# Patient Record
Sex: Male | Born: 1990 | Hispanic: Yes | Marital: Single | Smoking: Never smoker
Health system: Southern US, Community
[De-identification: ages and names within clinical notes are randomized; demographics above are authoritative.]

## PROBLEM LIST (undated history)

## (undated) DIAGNOSIS — B582 Toxoplasma meningoencephalitis: Secondary | ICD-10-CM

## (undated) DIAGNOSIS — D893 Immune reconstitution syndrome: Secondary | ICD-10-CM

## (undated) DIAGNOSIS — D649 Anemia, unspecified: Secondary | ICD-10-CM

## (undated) DIAGNOSIS — K148 Other diseases of tongue: Secondary | ICD-10-CM

## (undated) DIAGNOSIS — Z21 Asymptomatic human immunodeficiency virus [HIV] infection status: Secondary | ICD-10-CM

## (undated) DIAGNOSIS — R531 Weakness: Secondary | ICD-10-CM

## (undated) DIAGNOSIS — Q821 Xeroderma pigmentosum: Secondary | ICD-10-CM

## (undated) DIAGNOSIS — B20 Human immunodeficiency virus [HIV] disease: Secondary | ICD-10-CM

## (undated) DIAGNOSIS — G629 Polyneuropathy, unspecified: Secondary | ICD-10-CM

## (undated) DIAGNOSIS — I75021 Atheroembolism of right lower extremity: Secondary | ICD-10-CM

## (undated) HISTORY — DX: Immune reconstitution syndrome: D89.3

## (undated) HISTORY — DX: Atheroembolism of right lower extremity: I75.021

## (undated) HISTORY — DX: Other diseases of tongue: K14.8

## (undated) HISTORY — PX: NO PAST SURGERIES: SHX2092

## (undated) HISTORY — DX: Polyneuropathy, unspecified: G62.9

## (undated) HISTORY — DX: Xeroderma pigmentosum: Q82.1

## (undated) HISTORY — DX: Weakness: R53.1

---

## 2018-08-10 ENCOUNTER — Encounter (HOSPITAL_COMMUNITY): Payer: Self-pay | Admitting: Pharmacy Technician

## 2018-08-10 ENCOUNTER — Other Ambulatory Visit: Payer: Self-pay

## 2018-08-10 ENCOUNTER — Emergency Department (HOSPITAL_COMMUNITY): Payer: Self-pay

## 2018-08-10 ENCOUNTER — Inpatient Hospital Stay (HOSPITAL_COMMUNITY)
Admission: EM | Admit: 2018-08-10 | Discharge: 2018-08-18 | DRG: 974 | Disposition: A | Discharge status: Home / Self Care | Payer: Self-pay | Attending: Internal Medicine | Admitting: Internal Medicine

## 2018-08-10 DIAGNOSIS — G9340 Encephalopathy, unspecified: Secondary | ICD-10-CM | POA: Diagnosis present

## 2018-08-10 DIAGNOSIS — H5461 Unqualified visual loss, right eye, normal vision left eye: Secondary | ICD-10-CM | POA: Diagnosis present

## 2018-08-10 DIAGNOSIS — B20 Human immunodeficiency virus [HIV] disease: Principal | ICD-10-CM | POA: Insufficient documentation

## 2018-08-10 DIAGNOSIS — F1910 Other psychoactive substance abuse, uncomplicated: Secondary | ICD-10-CM

## 2018-08-10 DIAGNOSIS — G939 Disorder of brain, unspecified: Secondary | ICD-10-CM

## 2018-08-10 DIAGNOSIS — E46 Unspecified protein-calorie malnutrition: Secondary | ICD-10-CM | POA: Diagnosis present

## 2018-08-10 DIAGNOSIS — Z791 Long term (current) use of non-steroidal anti-inflammatories (NSAID): Secondary | ICD-10-CM

## 2018-08-10 DIAGNOSIS — H532 Diplopia: Secondary | ICD-10-CM | POA: Diagnosis present

## 2018-08-10 DIAGNOSIS — Z6821 Body mass index (BMI) 21.0-21.9, adult: Secondary | ICD-10-CM

## 2018-08-10 DIAGNOSIS — D638 Anemia in other chronic diseases classified elsewhere: Secondary | ICD-10-CM | POA: Diagnosis present

## 2018-08-10 DIAGNOSIS — E860 Dehydration: Secondary | ICD-10-CM | POA: Diagnosis present

## 2018-08-10 DIAGNOSIS — R339 Retention of urine, unspecified: Secondary | ICD-10-CM | POA: Diagnosis present

## 2018-08-10 DIAGNOSIS — G9349 Other encephalopathy: Secondary | ICD-10-CM | POA: Diagnosis present

## 2018-08-10 DIAGNOSIS — E871 Hypo-osmolality and hyponatremia: Secondary | ICD-10-CM

## 2018-08-10 DIAGNOSIS — A86 Unspecified viral encephalitis: Secondary | ICD-10-CM | POA: Diagnosis present

## 2018-08-10 DIAGNOSIS — R410 Disorientation, unspecified: Secondary | ICD-10-CM

## 2018-08-10 DIAGNOSIS — Z79899 Other long term (current) drug therapy: Secondary | ICD-10-CM

## 2018-08-10 DIAGNOSIS — B582 Toxoplasma meningoencephalitis: Secondary | ICD-10-CM

## 2018-08-10 DIAGNOSIS — G969 Disorder of central nervous system, unspecified: Secondary | ICD-10-CM

## 2018-08-10 DIAGNOSIS — E222 Syndrome of inappropriate secretion of antidiuretic hormone: Secondary | ICD-10-CM | POA: Diagnosis present

## 2018-08-10 DIAGNOSIS — G936 Cerebral edema: Secondary | ICD-10-CM | POA: Diagnosis present

## 2018-08-10 DIAGNOSIS — G0490 Encephalitis and encephalomyelitis, unspecified: Secondary | ICD-10-CM

## 2018-08-10 DIAGNOSIS — H547 Unspecified visual loss: Secondary | ICD-10-CM

## 2018-08-10 DIAGNOSIS — B589 Toxoplasmosis, unspecified: Secondary | ICD-10-CM | POA: Diagnosis present

## 2018-08-10 HISTORY — DX: Anemia, unspecified: D64.9

## 2018-08-10 HISTORY — DX: Human immunodeficiency virus (HIV) disease: B20

## 2018-08-10 HISTORY — DX: Toxoplasma meningoencephalitis: B58.2

## 2018-08-10 HISTORY — DX: Asymptomatic human immunodeficiency virus (hiv) infection status: Z21

## 2018-08-10 LAB — CBC WITH DIFFERENTIAL/PLATELET
ABS IMMATURE GRANULOCYTES: 0.06 10*3/uL (ref 0.00–0.07)
BASOS PCT: 1 %
Basophils Absolute: 0 10*3/uL (ref 0.0–0.1)
EOS ABS: 0.5 10*3/uL (ref 0.0–0.5)
Eosinophils Relative: 13 %
HEMATOCRIT: 40.8 % (ref 39.0–52.0)
Hemoglobin: 13.1 g/dL (ref 13.0–17.0)
IMMATURE GRANULOCYTES: 2 %
LYMPHS ABS: 0.9 10*3/uL (ref 0.7–4.0)
Lymphocytes Relative: 22 %
MCH: 27.4 pg (ref 26.0–34.0)
MCHC: 32.1 g/dL (ref 30.0–36.0)
MCV: 85.4 fL (ref 80.0–100.0)
MONO ABS: 0.4 10*3/uL (ref 0.1–1.0)
MONOS PCT: 9 %
Neutro Abs: 2 10*3/uL (ref 1.7–7.7)
Neutrophils Relative %: 53 %
PLATELETS: 333 10*3/uL (ref 150–400)
RBC: 4.78 MIL/uL (ref 4.22–5.81)
RDW: 12.6 % (ref 11.5–15.5)
WBC: 3.8 10*3/uL — ABNORMAL LOW (ref 4.0–10.5)
nRBC: 0 % (ref 0.0–0.2)

## 2018-08-10 LAB — COMPREHENSIVE METABOLIC PANEL
ALBUMIN: 3.3 g/dL — AB (ref 3.5–5.0)
ALK PHOS: 46 U/L (ref 38–126)
ALT: 21 U/L (ref 0–44)
AST: 35 U/L (ref 15–41)
Anion gap: 8 (ref 5–15)
BILIRUBIN TOTAL: 0.5 mg/dL (ref 0.3–1.2)
BUN: 11 mg/dL (ref 6–20)
CALCIUM: 8.4 mg/dL — AB (ref 8.9–10.3)
CO2: 22 mmol/L (ref 22–32)
Chloride: 99 mmol/L (ref 98–111)
Creatinine, Ser: 0.81 mg/dL (ref 0.61–1.24)
GFR calc Af Amer: >60 mL/min (ref >60)
GFR calc non Af Amer: >60 mL/min (ref >60)
GLUCOSE: 95 mg/dL (ref 70–99)
Potassium: 4.1 mmol/L (ref 3.5–5.1)
SODIUM: 129 mmol/L — AB (ref 135–145)
TOTAL PROTEIN: 8 g/dL (ref 6.5–8.1)

## 2018-08-10 LAB — CBC
HCT: 40.9 % (ref 39.0–52.0)
Hemoglobin: 13.1 g/dL (ref 13.0–17.0)
MCH: 26.7 pg (ref 26.0–34.0)
MCHC: 32 g/dL (ref 30.0–36.0)
MCV: 83.5 fL (ref 80.0–100.0)
PLATELETS: 348 10*3/uL (ref 150–400)
RBC: 4.9 MIL/uL (ref 4.22–5.81)
RDW: 12.6 % (ref 11.5–15.5)
WBC: 3.4 10*3/uL — ABNORMAL LOW (ref 4.0–10.5)
nRBC: 0 % (ref 0.0–0.2)

## 2018-08-10 LAB — CREATININE, SERUM
CREATININE: 0.77 mg/dL (ref 0.61–1.24)
GFR calc Af Amer: >60 mL/min (ref >60)
GFR calc non Af Amer: >60 mL/min (ref >60)

## 2018-08-10 LAB — ETHANOL: Alcohol, Ethyl (B): <10 mg/dL (ref <10)

## 2018-08-10 LAB — AMMONIA: Ammonia: 17 umol/L (ref 9–35)

## 2018-08-10 MED ORDER — GADOBUTROL 1 MMOL/ML IV SOLN
7.5000 mL | Freq: Once | INTRAVENOUS | Status: AC | PRN
  Administered 2018-08-10: 7 mL via INTRAVENOUS

## 2018-08-10 MED ORDER — SULFAMETHOXAZOLE-TRIMETHOPRIM 400-80 MG/5ML IV SOLN
10.0000 mg/kg/d | Freq: Two times a day (BID) | INTRAVENOUS | Status: DC
  Administered 2018-08-10 – 2018-08-13 (×6): 306.08 mg via INTRAVENOUS
  Filled 2018-08-10 (×9): qty 19.13

## 2018-08-10 MED ORDER — ACETAMINOPHEN 650 MG RE SUPP: 650.0000 mg | Freq: Four times a day (QID) | RECTAL | Status: DC | PRN

## 2018-08-10 MED ORDER — ACETAMINOPHEN 325 MG PO TABS
650.0000 mg | ORAL_TABLET | Freq: Four times a day (QID) | ORAL | Status: DC | PRN
  Administered 2018-08-11 – 2018-08-13 (×3): 650 mg via ORAL
  Filled 2018-08-10 (×4): qty 2

## 2018-08-10 MED ORDER — DEXTROSE-NACL 5-0.9 % IV SOLN
INTRAVENOUS | Status: DC
  Administered 2018-08-10 – 2018-08-12 (×2): via INTRAVENOUS

## 2018-08-10 MED ORDER — ACETAMINOPHEN 325 MG PO TABS
650.0000 mg | ORAL_TABLET | Freq: Once | ORAL | Status: AC
  Administered 2018-08-10: 650 mg via ORAL
  Filled 2018-08-10: qty 2

## 2018-08-10 MED ORDER — ONDANSETRON HCL 4 MG PO TABS
4.0000 mg | ORAL_TABLET | Freq: Four times a day (QID) | ORAL | Status: DC | PRN
  Filled 2018-08-10: qty 1

## 2018-08-10 MED ORDER — ONDANSETRON HCL 4 MG/2ML IJ SOLN
4.0000 mg | Freq: Four times a day (QID) | INTRAMUSCULAR | Status: DC | PRN
  Administered 2018-08-12: 4 mg via INTRAVENOUS
  Filled 2018-08-10: qty 2

## 2018-08-10 MED ORDER — BOOST / RESOURCE BREEZE PO LIQD CUSTOM
1.0000 | Freq: Three times a day (TID) | ORAL | Status: DC
  Administered 2018-08-11: 21:00:00 via ORAL
  Administered 2018-08-11 – 2018-08-18 (×19): 1 via ORAL

## 2018-08-10 NOTE — ED Provider Notes (Addendum)
[facility] EMERGENCY DEPARTMENT Provider Note   CSN: [id number] Arrival date & time: 08/10/18  1118     History   Chief Complaint Chief Complaint  Patient presents with  . Weakness    HPI Thomas Park is a 27 y.o. male. Translation through video interpreter.  HPI   Patient presents with his sister for evaluation of abnormal behavior.  Patient has been ill for about 1 month with fever, myalgia and red eyes.  He went to a clinic on 08/04/2018, had blood work at that time which showed that his HIV screen was positive, for HIV 1.  No prior diagnosis of HIV disease.  He also had screening evaluation at that time with CBC, c-Met which were all normal.  For the last 3 days the patient has had trouble remembering things, paying attention and felt to be confused.  He is here with his sister, who helps give history.  He lives with another sister who is not currently here.  No prior similar symptoms.  Level 5 caveat-patient is unable to give history  Past Medical History:  Diagnosis Date  . HIV (human immunodeficiency virus infection) ([place])     There are no active problems to display for this patient.   History reviewed. No pertinent surgical history.      Home Medications    Prior to Admission medications   Not on File    Family History No family history on file.  Social History Social History   Tobacco Use  . Smoking status: Not on file  Substance Use Topics  . Alcohol use: Yes    Comment: occasional  . Drug use: Not on file     Allergies   Patient has no known allergies.   Review of Systems Review of Systems  Unable to perform ROS: Mental status change     Physical Exam Updated Vital Signs BP 127/71   Pulse 86   Resp 16   SpO2 99%   Physical Exam  Constitutional: He appears well-developed and well-nourished. He appears distressed (Restless, uncomfortable).  HENT:  Head: Normocephalic and atraumatic.  Right Ear:  External ear normal.  Left Ear: External ear normal.  Eyes: Pupils are equal, round, and reactive to light. Conjunctivae and EOM are normal.  Neck: Normal range of motion and phonation normal. Neck supple.  Cardiovascular: Normal rate, regular rhythm and normal heart sounds.  Pulmonary/Chest: Effort normal and breath sounds normal. He exhibits no bony tenderness.  Abdominal: Soft. There is no tenderness.  Musculoskeletal: Normal range of motion.  Neurological: He is alert. No cranial nerve deficit or sensory deficit. He exhibits normal muscle tone. Coordination normal.  He is oriented to person and place.  Skin: Skin is warm, dry and intact.  Psychiatric:  He is distracted.  He does not appear to be responding to internal stimuli.  Nursing note and vitals reviewed.    ED Treatments / Results  Labs (all labs ordered are listed, but only abnormal results are displayed) Labs Reviewed  COMPREHENSIVE METABOLIC PANEL - Abnormal; Notable for the following components:      Result Value   Sodium 129 (*)    Calcium 8.4 (*)    Albumin 3.3 (*)    All other components within normal limits  CBC WITH DIFFERENTIAL/PLATELET - Abnormal; Notable for the following components:   WBC 3.8 (*)    All other components within normal limits  AMMONIA  ETHANOL  HIV ANTIBODY (ROUTINE TESTING W  REFLEX)  URINALYSIS, ROUTINE W REFLEX MICROSCOPIC  RAPID URINE DRUG SCREEN, HOSP PERFORMED  T-HELPER CELLS (CD4) COUNT (NOT AT [facility])    EKG None  Radiology Mr Jeri Cos And Wo Contrast  Result Date: 08/10/2018 CLINICAL DATA:  Confusion.  HIV. EXAM: MRI HEAD WITHOUT AND WITH CONTRAST TECHNIQUE: Multiplanar, multiecho pulse sequences of the brain and surrounding structures were obtained without and with intravenous contrast. CONTRAST:  7 cc Gadavist intravenous COMPARISON:  None. FINDINGS: Brain: Multiple foci of nodular and ring enhancement along the subcortical brain, bilateral cerebellum, left brainstem, and  basal ganglia/deep white matter tracts. The largest and most avidly enhancing lesion is centered at the left globus pallidus and measures 2.4 cm. This lesion also shows the greatest perilesional edema. There is restricted diffusion but this is at the periphery rather than the center of the lesions, incompatible with pyogenic abscess. No acute hemorrhage, hydrocephalus, or extra-axial collection. No prominence of dilated perivascular spaces and no abnormal meningeal enhancement. Toxoplasmosis is favored. Other infection such as neurocysticercosis or tuberculosis is possible. Lymphoma is not favored given multi focality. Metastatic disease also considered unlikely given age. Vascular: Major flow voids are preserved. Skull and upper cervical spine: Current negative for marrow lesion Sinuses/Orbits: Negative IMPRESSION: 1. Numerous ring-enhancing lesions greatest at the left basal gangliawhich would classically represent toxoplasmosis in this setting (assuming matching CD4 level-which is currently pending). Other opportunistic infection is possible, including CNS tuberculosis, and needs correlation with labs. 2. Mild to moderate vasogenic edema with regional mass effect greatest at the left basal ganglia. Electronically Signed   By: Monte Fantasia M.D.   On: 08/10/2018 14:35    Procedures .Critical Care Performed by: Daleen Bo, MD Authorized by: Daleen Bo, MD   Critical care provider statement:    Critical care time (minutes):  35   Critical care start time:  08/10/2018 11:35 AM   Critical care end time:  08/10/2018 4:25 PM   Critical care time was exclusive of:  Separately billable procedures and treating other patients   Critical care was time spent personally by me on the following activities:  Blood draw for specimens, development of treatment plan with patient or surrogate, discussions with consultants, evaluation of patient's response to treatment, examination of patient, obtaining history  from patient or surrogate, ordering and performing treatments and interventions, ordering and review of laboratory studies, pulse oximetry, re-evaluation of patient's condition, review of old charts and ordering and review of radiographic studies   (including critical care time)  Medications Ordered in ED Medications  gadobutrol (GADAVIST) 1 MMOL/ML injection 7.5 mL (7 mLs Intravenous Contrast Given 08/10/18 1424)  acetaminophen (TYLENOL) tablet 650 mg (650 mg Oral Given 08/10/18 1631)     Initial Impression / Assessment and Plan / ED Course  I have reviewed the triage vital signs and the nursing notes.  Pertinent labs & imaging results that were available during my care of the patient were reviewed by me and considered in my medical decision making (see chart for details).  Clinical Course as of Aug 10 1638  Wed Aug 10, 2018  1616 Normal except white count low  CBC with Differential(!) [EW]  1616 Normal  Ethanol [EW]  1616 Normal except sodium low, calcium low, albumin low  Comprehensive metabolic panel(!) [EW]  9924 Nodular ring-enhancing lesions throughout the brain.  This is consistent with HIV related toxoplasmosis.  Images reviewed by me.   [EW]    Clinical Course User Index [EW] Daleen Bo, MD  Patient Vitals for the past 24 hrs:  BP Pulse Resp SpO2  08/10/18 1626 127/71 - - -  08/10/18 1230 108/64 86 - 99 %  08/10/18 1215 107/65 88 - 100 %  08/10/18 1200 107/68 84 - 100 %  08/10/18 1144 110/67 84 16 100 %    4:19 PM Reevaluation with update and discussion. After initial assessment and treatment, an updated evaluation reveals no change in clinical status. Daleen Bo   Medical Decision Making: Acute confusion, with positive testing for HIV disease, as well as MRI consistent with toxoplasmosis.  This meets criteria for advanced HIV illness and will require intervention with medical treatment and hospitalization for stabilization.  CRITICAL  CARE-yes Performed by: Daleen Bo   Nursing Notes Reviewed/ Care Coordinated Applicable Imaging Reviewed Interpretation of Laboratory Data incorporated into ED treatment   4:20 PM-Consult complete with hospitalist. Patient case explained and discussed.  He agrees to admit patient for further evaluation and treatment. Call ended at 4:38 PM  Plan: Admit    Final Clinical Impressions(s) / ED Diagnoses   Final diagnoses:  Symptomatic HIV infection ([place])  Confusion  Toxoplasmosis    ED Discharge Orders    None       Daleen Bo, MD 08/10/18 1639    Daleen Bo, MD 08/30/18 1221

## 2018-08-10 NOTE — ED Triage Notes (Addendum)
Pt arrives to ED from home with complaints of becoming more weak and confused for awhile now. Pt's family stated that he has been more dizzy and has trouble remembering things. Pt reports he has anemia and was just diagnosed with HIV this week. Pt states he has had ulcers in his mouth and itching on his skin. Pt placed in position of comfort with bed locked and lowered, call bell in reach.

## 2018-08-10 NOTE — Progress Notes (Signed)
Pharmacy Antibiotic Note  Thomas Park Thomas Park is a 27 y.o. 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  Pharmacy has been consulted for bactrim dosing. Pt with Tmax 100.8 and WBC is low at 3.8. SCr is WNL. Pt recently diagnosed with HIV.   Plan: Bactrim 10mg /kg/day divided Q12H F/u renal fxn, C&S, clinical status  F/u measured pt weight - current weight is estimated  Weight: 135 lb (61.2 kg)(estimated)  Temp (24hrs), Avg:100.8 F (38.2 C), Min:100.8 F (38.2 C), Max:100.8 F (38.2 C)  Recent Labs  Lab 08/10/18 1221  WBC 3.8*  CREATININE 0.81    CrCl cannot be calculated (Unknown ideal weight.).    No Known Allergies  Antimicrobials this admission: Bactrim 11/27>>  Dose adjustments this admission: N/A  Microbiology results: Pending  Thank you for allowing pharmacy to be a part of this patient's care.  Khaila Velarde, Rande Lawman 08/10/2018 6:04 PM

## 2018-08-10 NOTE — H&P (Addendum)
History and Physical    Phill Steck EXB:[id number] DOB: Apr 20, 1991 DOA: 08/10/2018  PCP: Patient, No Pcp Per   Patient coming from: Home   Chief Complaint: Altered mental status.   HPI: Thomas Park de Gwendalyn Ege is a 27 y.o. male with medical history significant of polysubstance abuse and recently diagnosed HIV. All information obtained from his family at the bedside, patient unable to give any history due to confusion and disorientation. For last 3 months patient has been unintentionally loosing weight, over the last month he experienced flu like symptoms including headache, congestion, malise which were persistent. He also was noted to have disseminated macular papular rash that eventually self resolved. Last week he was seen at the local clinic, HIV testing was performed which resulted positive. He was informed about this results 48 hours ago. For the last 5 days he has been confused, disoriented, had difficulty ambulating, and had poor appetite. Due to worsening confusion he was brought into the hospital today.   Apparently he is known to use illegal drugs, unknown IV drug abuse   ED Course: patient was non-in looking, confused and disoriented,unable to ambulate. Further workup with brain MRI showed numerous ring-enhancing lesion suggested toxoplasmosis. He was referred for admission and further evaluation.  Review of Systems: Obtain from his family at bedside 1. General: postoperative weight loss, malaise and failure to thrive.  2. ENT: No runny nose or sore throat, no hearing disturbances/ sinus congestion.  3. Pulmonary: No dyspnea, cough, wheezing, or hemoptysis 4. Cardiovascular: No angina, claudication, lower extremity edema, pnd or orthopnea 5. Gastrointestinal: No nausea or vomiting, no diarrhea or constipation 6. Hematology: No easy bruisability or frequent infections 7. Urology: No dysuria, hematuria or increased urinary frequency 8. Dermatology: diffuse  maculopapular rash as mentioned in the HPI. 9. Neurology: Confusion and disorientation.  10. Musculoskeletal: No joint pain or deformities  Past Medical History:  Diagnosis Date  . HIV (human immunodeficiency virus infection) ([place])     History reviewed. No pertinent surgical history.   reports that he drinks alcohol. His tobacco and drug histories are not on file.  No Known Allergies  No family history on file.   Prior to Admission medications   Not on File    Physical Exam: Vitals:   08/10/18 1200 08/10/18 1215 08/10/18 1230 08/10/18 1626  BP: 107/68 107/65 108/64 127/71  Pulse: 84 88 86   Resp:      SpO2: 100% 100% 99%     Vitals:   08/10/18 1200 08/10/18 1215 08/10/18 1230 08/10/18 1626  BP: 107/68 107/65 108/64 127/71  Pulse: 84 88 86   Resp:      SpO2: 100% 100% 99%    General: deconditioned and ill looking appearing  Neurology: Awake and alert, non focal, follows commands and preserved strength, positive confusion and disorientation, only know his name and recognized his family members.  Head and Neck. Head normocephalic. Neck supple with no adenopathy or thyromegaly.   E ENT: no pallor, no icterus, oral mucosa moist with no ulcers/ Positive bilateral conjunctival erythema.  Cardiovascular: No JVD. S1-S2 present, rhythmic, no gallops, rubs, or murmurs. No lower extremity edema. Pulmonary: positive breath sounds bilaterally, adequate air movement, no wheezing, rhonchi or rales. Gastrointestinal. Abdomen flat, no organomegaly, non tender, no rebound or guarding Skin. Healing diffuse macular rash.  Musculoskeletal: no joint deformities    Labs on Admission: I have personally reviewed following labs and imaging studies  CBC: Recent Labs  Lab  08/10/18 1221  WBC 3.8*  NEUTROABS 2.0  HGB 13.1  HCT 40.8  MCV 85.4  PLT 443   Basic Metabolic Panel: Recent Labs  Lab 08/10/18 1221  NA 129*  K 4.1  CL 99  CO2 22  GLUCOSE 95  BUN 11  CREATININE 0.81    CALCIUM 8.4*   GFR: CrCl cannot be calculated (Unknown ideal weight.). Liver Function Tests: Recent Labs  Lab 08/10/18 1221  AST 35  ALT 21  ALKPHOS 46  BILITOT 0.5  PROT 8.0  ALBUMIN 3.3*   No results for input(s): LIPASE, AMYLASE in the last 168 hours. Recent Labs  Lab 08/10/18 1221  AMMONIA 17   Coagulation Profile: No results for input(s): INR, PROTIME in the last 168 hours. Cardiac Enzymes: No results for input(s): CKTOTAL, CKMB, CKMBINDEX, TROPONINI in the last 168 hours. BNP (last 3 results) No results for input(s): PROBNP in the last 8760 hours. HbA1C: No results for input(s): HGBA1C in the last 72 hours. CBG: No results for input(s): GLUCAP in the last 168 hours. Lipid Profile: No results for input(s): CHOL, HDL, LDLCALC, TRIG, CHOLHDL, LDLDIRECT in the last 72 hours. Thyroid Function Tests: No results for input(s): TSH, T4TOTAL, FREET4, T3FREE, THYROIDAB in the last 72 hours. Anemia Panel: No results for input(s): VITAMINB12, FOLATE, FERRITIN, TIBC, IRON, RETICCTPCT in the last 72 hours. Urine analysis: No results found for: COLORURINE, APPEARANCEUR, LABSPEC, [place], GLUCOSEU, [place], [place], KETONESUR, PROTEINUR, UROBILINOGEN, NITRITE, LEUKOCYTESUR  Radiological Exams on Admission: Mr Jeri Cos And Wo Contrast  Result Date: 08/10/2018 CLINICAL DATA:  Confusion.  HIV. EXAM: MRI HEAD WITHOUT AND WITH CONTRAST TECHNIQUE: Multiplanar, multiecho pulse sequences of the brain and surrounding structures were obtained without and with intravenous contrast. CONTRAST:  7 cc Gadavist intravenous COMPARISON:  None. FINDINGS: Brain: Multiple foci of nodular and ring enhancement along the subcortical brain, bilateral cerebellum, left brainstem, and basal ganglia/deep white matter tracts. The largest and most avidly enhancing lesion is centered at the left globus pallidus and measures 2.4 cm. This lesion also shows the greatest perilesional edema. There is restricted  diffusion but this is at the periphery rather than the center of the lesions, incompatible with pyogenic abscess. No acute hemorrhage, hydrocephalus, or extra-axial collection. No prominence of dilated perivascular spaces and no abnormal meningeal enhancement. Toxoplasmosis is favored. Other infection such as neurocysticercosis or tuberculosis is possible. Lymphoma is not favored given multi focality. Metastatic disease also considered unlikely given age. Vascular: Major flow voids are preserved. Skull and upper cervical spine: Current negative for marrow lesion Sinuses/Orbits: Negative IMPRESSION: 1. Numerous ring-enhancing lesions greatest at the left basal gangliawhich would classically represent toxoplasmosis in this setting (assuming matching CD4 level-which is currently pending). Other opportunistic infection is possible, including CNS tuberculosis, and needs correlation with labs. 2. Mild to moderate vasogenic edema with regional mass effect greatest at the left basal ganglia. Electronically Signed   By: Monte Fantasia M.D.   On: 08/10/2018 14:35    EKG: Independently reviewed. NA  Assessment/Plan Active Problems:   Encephalitis  27 year old male who has been recently diagnosed with HIV, describes a syndrome consistent with acute HIV few months ago, with flu-like symptoms and disseminated rash. Over the last 5 days his condition has been complicated with worsening confusion and disorientation. On his initial physical examination he is only oriented to self,  able to recognize his family members and follow commands but otherwise he has significant confusion and disorientation.  His blood pressure is 107/68, heart rate 84,  oxygen saturation 100%, positive conjunctival erythema bilaterally, oral mucosa with no ulcers, lungs clear to auscultation bilaterally, heart S1-S2 present and rhythmic, abdomen soft nontender, lower extremity edema, diffuse macular rash that seems to be healing.  Sodium 129,  potassium 4.1, chloride 99, bicarb 22, glucose 95, BUN 11, creatinine 0.81, white count 3.8, hemoglobin 13.1, hematocrit 40.8, platelets 333.  Brain MRI with numerous ring-enhancing lesions greatest at the left basal ganglia, consistent with toxoplasmosis.  Mild to moderate vasogenic edema with regional mass-effect greatest at the left basal ganglia.   Patient will be admitted to the hospital with the working diagnosis with acute encephalitis, likely due to toxoplasmosis in the setting of immunosuppression due to HIV.  1.  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  Patient has clinical syndrome consistent with toxoplasmosis, will confirm HIV with serologies including viral load, and CD4 count. Recommended antibiotic therapy sulfadiazine and pyrimethamine, in his condition will prefer IV antibiotic therapy, case discussed with Dr. Tommy Medal and will start patient with IV Bactrim.  Start IV fluids with D5 normal saline at 75 ml /H.  Patient likely will need a lumbar puncture for further diagnostics. Neuro hecks every 4 hours, and seizure precautions.  Will send serum cryptococcus Ag, toxoplasma serum IgG, gold TB test.   Will call IR for LP, will send for cryptococcus antigen, toxoplasma antigen, may benefit from cytology as well, be beneficial since lymphoma is also in the differential.  2.  Hyponatremia.  Related to dehydration, continue IV fluids with D5, normal saline at 75 mL/h  3.  Polysubstance abuse.  Currently no signs of withdrawal.  Continue neurochecks per unit protocol.  I spoke with IR today and patient will undergo lumbar function tomorrow morning.  DVT prophylaxis: scd  Code Status: full  Family Communication: I spoke with patient's father at the bedside and all questions were addressed.    Disposition Plan: Med Surg   Consults called: ID  Admission status: Inpatient.     Melinda Gwinner Gerome Apley MD [facility] Pager [phone]  If 7PM-7AM, please contact  night-coverage [email or web address] Password [facility]  08/10/2018, 4:41 PM

## 2018-08-11 ENCOUNTER — Inpatient Hospital Stay (HOSPITAL_COMMUNITY): Payer: Self-pay

## 2018-08-11 DIAGNOSIS — F1910 Other psychoactive substance abuse, uncomplicated: Secondary | ICD-10-CM

## 2018-08-11 DIAGNOSIS — E871 Hypo-osmolality and hyponatremia: Secondary | ICD-10-CM

## 2018-08-11 LAB — BASIC METABOLIC PANEL
ANION GAP: 6 (ref 5–15)
BUN: 7 mg/dL (ref 6–20)
CO2: 22 mmol/L (ref 22–32)
Calcium: 8.3 mg/dL — ABNORMAL LOW (ref 8.9–10.3)
Chloride: 101 mmol/L (ref 98–111)
Creatinine, Ser: 0.87 mg/dL (ref 0.61–1.24)
GFR calc non Af Amer: >60 mL/min (ref >60)
GLUCOSE: 103 mg/dL — AB (ref 70–99)
POTASSIUM: 3.9 mmol/L (ref 3.5–5.1)
Sodium: 129 mmol/L — ABNORMAL LOW (ref 135–145)

## 2018-08-11 LAB — CSF CELL COUNT WITH DIFFERENTIAL
RBC Count, CSF: 1 /mm3 — ABNORMAL HIGH
TUBE #: 3
WBC CSF: 4 /mm3 (ref 0–5)

## 2018-08-11 LAB — CBC
HCT: 38.4 % — ABNORMAL LOW (ref 39.0–52.0)
HEMOGLOBIN: 12.4 g/dL — AB (ref 13.0–17.0)
MCH: 26.9 pg (ref 26.0–34.0)
MCHC: 32.3 g/dL (ref 30.0–36.0)
MCV: 83.3 fL (ref 80.0–100.0)
NRBC: 0 % (ref 0.0–0.2)
PLATELETS: 334 10*3/uL (ref 150–400)
RBC: 4.61 MIL/uL (ref 4.22–5.81)
RDW: 12.5 % (ref 11.5–15.5)
WBC: 3.9 10*3/uL — ABNORMAL LOW (ref 4.0–10.5)

## 2018-08-11 LAB — CRYPTOCOCCAL ANTIGEN, CSF: CRYPTO AG: NEGATIVE

## 2018-08-11 LAB — PROTEIN, CSF: Total  Protein, CSF: 80 mg/dL — ABNORMAL HIGH (ref 15–45)

## 2018-08-11 LAB — GLUCOSE, CSF: Glucose, CSF: 41 mg/dL (ref 40–70)

## 2018-08-11 LAB — CRYPTOCOCCAL ANTIGEN: CRYPTO AG: NEGATIVE

## 2018-08-11 MED ORDER — LIDOCAINE HCL (PF) 1 % IJ SOLN
5.0000 mL | Freq: Once | INTRAMUSCULAR | Status: AC
  Administered 2018-08-11: 5 mL via INTRADERMAL

## 2018-08-11 NOTE — Consult Note (Addendum)
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 HIV and AIDS 2. Multiple CNS lesions with differential including: CNS toxoplasmosis, cryptococcal infection, tuberculosis, less likely neurocysticercosis, brain abscesses, and primary CNS lymphoma, less likely syphilitic lesions 3. Loss of vision concerning for increased intracranial pressure versus opportunistic infection in the eye  Plan:  1. Agree with IV Bactrim which is been initiated for empiric therapy versus toxoplasmosis 2. Dr. Jimmye Norman from interventional radiology is going to perform a fluoroscopic guided lumbar puncture to measure opening pressure and remove sufficient CSF to drop that pressure in half and preferably less than 20 cm I am hoping that he can collect somewhere between 32 to 40 mL of CSF documentation of opening and closing pressures 3. CSF for toxoplasma by PCR 4. CSF for cryptococcal antigen 5. CSF VDRL 6. CSF cell count differential glucose and protein 7. Sent for MTB by PCR 8. CSF for AFB stain and culture and for this she needs at least 8 to 10 mL of CSF spun down in the sediment sent for culture 9. CSF for EBV by PCR 10. Save all remaining CSF 11. Ensure viral load with genotype is pending along with CD4 count 12. Toxoplasma antibody IgG should be sent 13. Send a QuantiFERON gold as well 14. He will need to be examined by an ophthalmologist with funduscopic exam in case he has a retinal pathology going on.  This will be surgically paramount if the patient does not have elevated intracranial pressures and does not get better as far as his vision after lumbar puncture. 26. Send blood for CMV viral load though that does not prove or disprove endorgan infection in the eye example.  Active Problems:   Encephalitis   Hyponatremia   Substance abuse (HCC)   Scheduled Meds: . feeding supplement  1  Container Oral TID BM  . lidocaine (PF)  5 mL Intradermal Once   Continuous Infusions: . dextrose 5 % and 0.9% NaCl 75 mL/hr at 08/10/18 2325  . sulfamethoxazole-trimethoprim 306.08 mg (08/11/18 0612)   PRN Meds:.acetaminophen **OR** acetaminophen, ondansetron **OR** ondansetron (ZOFRAN) IV  HPI: Thomas Park Thomas Park is a 27 y.o. male originally from [place] who now lives in [place] and was recently diagnosed with HIV though he is not giving me much information about where that test was performed.  I reviewed him with the telephonic translator from [facility] core.  His sister was present with his permission.  He presented to the emergency department with confusion being only oriented to self and only able to recognize some family members and follow commands.  He had bilateral positive conjunctive I will erythema.  He had a headache and was hyponatremic.  Brain MRI showed numerous ring-enhancing lesions most significantly in the basal ganglia.  There is mild to moderate vasogenic edema with regional mass-effect greatest in the left basal ganglia.  Concern was for toxoplasmosis versus other potential opportunistic infection such as cryptococcal meningoencephalitis, primary CNS lymphoma, other dimorphic fungal infections tuberculosis, less likely neurocysticercosis.  So less likely to be syphilitic lesion.  When I examined him today and interviewed him he was oriented to place and recognized his sister and answered many of my questions I would not tell me what [place] was [place]  He did tell  me that he was having trouble with vision and when I inquired more closely it specifically with his peripheral vision which makes me concerned that he might have elevated intracranial pressures.  I discussed the case with the hospitalist last night and again this morning and also with Dr. Jimmye Norman with interventional radiology.  I spent greater than 80 minutes with  the patient including greater than 50% of time in face to face counsel  of the patient nature of HIV and AIDS the way we would work-up his lesions and treat them and in coordination of care with the primary team and interventional radiology.  Review of Systems: Review of Systems  Constitutional: Positive for chills and fever.  HENT: Positive for sore throat. Negative for hearing loss and tinnitus.   Eyes: Positive for blurred vision. Negative for double vision.  Respiratory: Negative for cough, sputum production, shortness of breath and wheezing.   Cardiovascular: Negative for chest pain, palpitations and leg swelling.  Gastrointestinal: Negative for abdominal pain, blood in stool, constipation, diarrhea, heartburn, melena, nausea and vomiting.  Genitourinary: Negative for dysuria, flank pain and hematuria.  Musculoskeletal: Negative for back pain, falls, joint pain and myalgias.  Skin: Negative for itching and rash.  Neurological: Positive for dizziness and headaches. Negative for sensory change, focal weakness, loss of consciousness and weakness.  Endo/Heme/Allergies: Does not bruise/bleed easily.  Psychiatric/Behavioral: Positive for memory loss. Negative for depression and suicidal ideas. The patient is not nervous/anxious.     Past Medical History:  Diagnosis Date  . Anemia    [name] 08/10/2018  . HIV (human immunodeficiency virus infection) ([place])    dx'd 07/2018  . 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 Smoking status: Never Smoker  . Smokeless tobacco: Never Used  Substance Use Topics  . Alcohol use: Yes    Alcohol/week: 6.0 standard drinks    Types: 6 Cans of beer per week    Comment: 08/10/2018 "drink only on weekend"  . Drug use: Not Currently    Comment: 08/10/2018 "have tried coke; been a long time since I've usd any"    History reviewed. No pertinent family history. No Known Allergies  OBJECTIVE: Blood pressure 100/65,  pulse 83, temperature 98.7 F (37.1 C), temperature source Oral, resp. rate 16, weight 61.2 kg, SpO2 97 %.  Physical Exam  HENT:  Head: Normocephalic.  Mouth/Throat: Oropharynx is clear and moist.  Eyes: Pupils are equal, round, and reactive to light. EOM are normal. Right conjunctiva is injected. Left conjunctiva is injected.  Cardiovascular: Normal rate, regular rhythm, normal heart sounds and intact distal pulses. Exam reveals no friction rub.  No murmur heard. Pulmonary/Chest: Effort normal and breath sounds normal. No stridor. No respiratory distress.  Abdominal: Soft. Bowel sounds are normal. He exhibits no distension.  Neurological: He is alert. No cranial nerve deficit or sensory deficit. He exhibits normal muscle tone. Coordination normal.  Skin: Skin is warm and dry.  Psychiatric: He has a normal mood and affect. Judgment and thought content normal. His speech is delayed. He is slowed. Cognition and memory are normal.    Lab Results Lab Results  Component Value Date   WBC 3.9 (L) 08/11/2018   HGB 12.4 (L) 08/11/2018   HCT 38.4 (L) 08/11/2018   MCV 83.3 08/11/2018   PLT 334 08/11/2018    Lab Results  Component Value Date   CREATININE 0.87 08/11/2018   BUN 7 08/11/2018   NA 129 (L) 08/11/2018  K 3.9 08/11/2018   CL 101 08/11/2018   CO2 22 08/11/2018    Lab Results  Component Value Date   ALT 21 08/10/2018   AST 35 08/10/2018   ALKPHOS 46 08/10/2018   BILITOT 0.5 08/10/2018     Microbiology: No results found for this or any previous visit (from the past 240 hour(s)).  Alcide Evener, [facility] [phone] pager  08/11/2018, 10:28 AM

## 2018-08-11 NOTE — Plan of Care (Signed)
  Problem: Education: Goal: Knowledge of General Education information will improve Description Including pain rating scale, medication(s)/side effects and non-pharmacologic comfort measures Outcome: Progressing   

## 2018-08-11 NOTE — Procedures (Signed)
Lumbar puncture at T8-0 with no complication.  Opening pressure 21 ml.  Closing pressure 10 ml.  32 cc clear fluid removed.

## 2018-08-11 NOTE — Progress Notes (Addendum)
PROGRESS NOTE    Thomas Park  QIH:[id number] DOB: [birth year] DOA: 08/10/2018 PCP: Patient, No Pcp Per    Brief Narrative:  27 year old male who has been recently diagnosed with HIV, describes a syndrome consistent with acute HIV few months ago, with flu-like symptoms and disseminated rash. Over the last 5 days his condition has been complicated with worsening confusion and disorientation. On his initial physical examination he is only oriented to self,  able to recognize his family members and follow commands but otherwise he has significant confusion and disorientation.  His blood pressure is 107/68, heart rate 84, oxygen saturation 100%, positive conjunctival erythema bilaterally, oral mucosa with no ulcers, lungs clear to auscultation bilaterally, heart S1-S2 present and rhythmic, abdomen soft nontender, lower extremity edema, diffuse macular rash that seems to be healing.  Sodium 129, potassium 4.1, chloride 99, bicarb 22, glucose 95, BUN 11, creatinine 0.81, white count 3.8, hemoglobin 13.1, hematocrit 40.8, platelets 333.  Brain MRI with numerous ring-enhancing lesions greatest at the left basal ganglia, consistent with toxoplasmosis.  Mild to moderate vasogenic edema with regional mass-effect greatest at the left basal ganglia.   Patient will be admitted to the hospital with the working diagnosis with acute encephalitis, likely due to toxoplasmosis in the setting of immunosuppression due to HIV.   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 Patient is more orientated today, but still not at his baseline, headache has improved but positive nausea and vomiting along with double vision. Patient will have LP today per IR, check toxoplasma and cryptococcus Ag, cell count, protein, glucose, cytology, gram stain and culture. Continue IV Bactrim. Patient has remained afebrile.   2. Hyponatremia. It has been persistent, will  continue hydration with isotonic saline and will advance diet since patient has improved mentation.   3. HIV/ AIDS. HIV still pending, but reported positive at outside facility, will continue standard precautions. Follow with ID recommendations.   4. Polysubstance abuse. No signs of withdrawal, will continue neuro checks.    DVT prophylaxis: scd   Code Status: full Family Communication: I spoke with patient's sister at the bedside and all questions were addressed.  Disposition Plan/ discharge barriers: pending clinical improvement.   There is no height or weight on file to calculate BMI. Malnutrition Type:      Malnutrition Characteristics:      Nutrition Interventions:     RN Pressure Injury Documentation:     Consultants:   ID  Procedures:     Antimicrobials:   IV bactrim     Subjective: Patient is more orientated today, but still not back to baseline, reported positive headache, nausea and vomiting. Positive blurry vision.   Objective: Vitals:   08/10/18 2014 08/10/18 2350 08/11/18 0356 08/11/18 0809  BP: 112/77 104/67 92/80 100/65  Pulse: 63 89 72 83  Resp:   15 16  Temp: 98.3 F (36.8 C) (!) 102 F (38.9 C) 99.6 F (37.6 C) 98.7 F (37.1 C)  TempSrc: Oral Oral Oral Oral  SpO2: 100% 100% 99% 97%  Weight:        Intake/Output Summary (Last 24 hours) at 08/11/2018 0850 Last data filed at 08/11/2018 8756 Gross per 24 hour  Intake 805 ml  Output -  Net 805 ml   Filed Weights   08/10/18 1700  Weight: 61.2 kg    Examination:   General: deconditioned  Neurology: Awake, orientated x2 place and person, not time,  able to follow commands and strength is preserved.  E ENT: no pallor, no icterus, oral mucosa moist. Improved conjunctival erythema.  Cardiovascular: No JVD. S1-S2 present, rhythmic, no gallops, rubs, or murmurs. No lower extremity edema. Pulmonary: Positive breath sounds bilaterally, adequate air movement, no wheezing, rhonchi or  rales. Gastrointestinal. Abdomen with no organomegaly, non tender, no rebound or guarding Skin. No rashes Musculoskeletal: no joint deformities     Data Reviewed: I have personally reviewed following labs and imaging studies  CBC: Recent Labs  Lab 08/10/18 1221 08/10/18 2148 08/11/18 0339  WBC 3.8* 3.4* 3.9*  NEUTROABS 2.0  --   --   HGB 13.1 13.1 12.4*  HCT 40.8 40.9 38.4*  MCV 85.4 83.5 83.3  PLT 333 348 782   Basic Metabolic Panel: Recent Labs  Lab 08/10/18 1221 08/10/18 2148 08/11/18 0339  NA 129*  --  129*  K 4.1  --  3.9  CL 99  --  101  CO2 22  --  22  GLUCOSE 95  --  103*  BUN 11  --  7  CREATININE 0.81 0.77 0.87  CALCIUM 8.4*  --  8.3*   GFR: CrCl cannot be calculated (Unknown ideal weight.). Liver Function Tests: Recent Labs  Lab 08/10/18 1221  AST 35  ALT 21  ALKPHOS 46  BILITOT 0.5  PROT 8.0  ALBUMIN 3.3*   No results for input(s): LIPASE, AMYLASE in the last 168 hours. Recent Labs  Lab 08/10/18 1221  AMMONIA 17   Coagulation Profile: No results for input(s): INR, PROTIME in the last 168 hours. Cardiac Enzymes: No results for input(s): CKTOTAL, CKMB, CKMBINDEX, TROPONINI in the last 168 hours. BNP (last 3 results) No results for input(s): PROBNP in the last 8760 hours. HbA1C: No results for input(s): HGBA1C in the last 72 hours. CBG: No results for input(s): GLUCAP in the last 168 hours. Lipid Profile: No results for input(s): CHOL, HDL, LDLCALC, TRIG, CHOLHDL, LDLDIRECT in the last 72 hours. Thyroid Function Tests: No results for input(s): TSH, T4TOTAL, FREET4, T3FREE, THYROIDAB in the last 72 hours. Anemia Panel: No results for input(s): VITAMINB12, FOLATE, FERRITIN, TIBC, IRON, RETICCTPCT in the last 72 hours.    Radiology Studies: I have reviewed all of the imaging during this hospital visit personally     Scheduled Meds: . feeding supplement  1 Container Oral TID BM   Continuous Infusions: . dextrose 5 % and 0.9%  NaCl 75 mL/hr at 08/10/18 2325  . sulfamethoxazole-trimethoprim 306.08 mg (08/11/18 0612)     LOS: 1 day        Senie Lanese Gerome Apley, MD [facility] Pager [phone]

## 2018-08-11 NOTE — Progress Notes (Signed)
Patient arrived in the unit at 2000 pm, seems confused and drowsy, does not speak english, resting in a bed bed at this time, bed is on lowest position, call bell and personal belongings are within reach, bed alarm is on, family member is in bed side, will contact [place] interpreter, and continue the treatments.

## 2018-08-12 ENCOUNTER — Inpatient Hospital Stay (HOSPITAL_COMMUNITY): Payer: Self-pay

## 2018-08-12 DIAGNOSIS — H53453 Other localized visual field defect, bilateral: Secondary | ICD-10-CM

## 2018-08-12 DIAGNOSIS — R937 Abnormal findings on diagnostic imaging of other parts of musculoskeletal system: Secondary | ICD-10-CM

## 2018-08-12 DIAGNOSIS — R930 Abnormal findings on diagnostic imaging of skull and head, not elsewhere classified: Secondary | ICD-10-CM

## 2018-08-12 DIAGNOSIS — D72819 Decreased white blood cell count, unspecified: Secondary | ICD-10-CM

## 2018-08-12 DIAGNOSIS — R410 Disorientation, unspecified: Secondary | ICD-10-CM

## 2018-08-12 LAB — URINALYSIS, ROUTINE W REFLEX MICROSCOPIC
Bilirubin Urine: NEGATIVE
Glucose, UA: NEGATIVE mg/dL
HGB URINE DIPSTICK: NEGATIVE
Ketones, ur: NEGATIVE mg/dL
Leukocytes, UA: NEGATIVE
Nitrite: NEGATIVE
Protein, ur: NEGATIVE mg/dL
SPECIFIC GRAVITY, URINE: 1.01 (ref 1.005–1.030)
pH: 6 (ref 5.0–8.0)

## 2018-08-12 LAB — CBC WITH DIFFERENTIAL/PLATELET
Abs Immature Granulocytes: 0.04 10*3/uL (ref 0.00–0.07)
Basophils Absolute: 0 10*3/uL (ref 0.0–0.1)
Basophils Relative: 1 %
EOS ABS: 0.4 10*3/uL (ref 0.0–0.5)
Eosinophils Relative: 9 %
HCT: 39.1 % (ref 39.0–52.0)
HEMOGLOBIN: 12.9 g/dL — AB (ref 13.0–17.0)
Immature Granulocytes: 1 %
LYMPHS ABS: 1 10*3/uL (ref 0.7–4.0)
Lymphocytes Relative: 24 %
MCH: 27.4 pg (ref 26.0–34.0)
MCHC: 33 g/dL (ref 30.0–36.0)
MCV: 83 fL (ref 80.0–100.0)
Monocytes Absolute: 0.5 10*3/uL (ref 0.1–1.0)
Monocytes Relative: 11 %
NEUTROS PCT: 54 %
Neutro Abs: 2.3 10*3/uL (ref 1.7–7.7)
Platelets: 347 10*3/uL (ref 150–400)
RBC: 4.71 MIL/uL (ref 4.22–5.81)
RDW: 12.6 % (ref 11.5–15.5)
WBC: 4.2 10*3/uL (ref 4.0–10.5)
nRBC: 0 % (ref 0.0–0.2)

## 2018-08-12 LAB — BASIC METABOLIC PANEL
ANION GAP: 7 (ref 5–15)
BUN: 7 mg/dL (ref 6–20)
CHLORIDE: 100 mmol/L (ref 98–111)
CO2: 21 mmol/L — ABNORMAL LOW (ref 22–32)
Calcium: 8.5 mg/dL — ABNORMAL LOW (ref 8.9–10.3)
Creatinine, Ser: 0.96 mg/dL (ref 0.61–1.24)
GFR calc Af Amer: >60 mL/min (ref >60)
GFR calc non Af Amer: >60 mL/min (ref >60)
Glucose, Bld: 93 mg/dL (ref 70–99)
Potassium: 4 mmol/L (ref 3.5–5.1)
Sodium: 128 mmol/L — ABNORMAL LOW (ref 135–145)

## 2018-08-12 LAB — ECHOCARDIOGRAM COMPLETE: Weight: 2160 oz

## 2018-08-12 LAB — HIV-1 RNA QUANT-NO REFLEX-BLD
HIV 1 RNA Quant: 319000 copies/mL
LOG10 HIV-1 RNA: 5.504 log10copy/mL

## 2018-08-12 LAB — HIV 1/2 AB DIFFERENTIATION
HIV 1 Ab: POSITIVE — AB
HIV 2 Ab: NEGATIVE

## 2018-08-12 LAB — T-HELPER CELLS (CD4) COUNT (NOT AT ARMC)
CD4 T CELL HELPER: 2 % — AB (ref 33–55)
CD4 T Cell Abs: 10 /uL — ABNORMAL LOW (ref 400–2700)

## 2018-08-12 LAB — RAPID URINE DRUG SCREEN, HOSP PERFORMED
Amphetamines: NOT DETECTED
BARBITURATES: NOT DETECTED
Benzodiazepines: NOT DETECTED
Cocaine: NOT DETECTED
Opiates: NOT DETECTED
TETRAHYDROCANNABINOL: NOT DETECTED

## 2018-08-12 LAB — EPSTEIN BARR VRS(EBV DNA BY PCR)
EBV DNA QN by PCR: NEGATIVE copies/mL
log10 EBV DNA Qn PCR: UNDETERMINED log10 copy/mL

## 2018-08-12 LAB — TOXOPLASMA GONDII ANTIBODY, IGG

## 2018-08-12 LAB — OSMOLALITY, URINE: Osmolality, Ur: 356 mOsm/kg (ref 300–900)

## 2018-08-12 LAB — NA AND K (SODIUM & POTASSIUM), RAND UR
POTASSIUM UR: 15 mmol/L
SODIUM UR: 90 mmol/L

## 2018-08-12 LAB — HIV ANTIBODY (ROUTINE TESTING W REFLEX): HIV Screen 4th Generation wRfx: REACTIVE — AB

## 2018-08-12 MED ORDER — TAB-A-VITE/IRON PO TABS
1.0000 | ORAL_TABLET | Freq: Every day | ORAL | Status: DC
  Administered 2018-08-12 – 2018-08-18 (×7): 1 via ORAL
  Filled 2018-08-12 (×7): qty 1

## 2018-08-12 MED ORDER — VITAMIN B-1 100 MG PO TABS
100.0000 mg | ORAL_TABLET | Freq: Every day | ORAL | Status: DC
  Administered 2018-08-12 – 2018-08-18 (×7): 100 mg via ORAL
  Filled 2018-08-12 (×7): qty 1

## 2018-08-12 NOTE — Plan of Care (Signed)
  Problem: Education: Goal: Knowledge of General Education information will improve Description Including pain rating scale, medication(s)/side effects and non-pharmacologic comfort measures Outcome: Progressing   

## 2018-08-12 NOTE — Progress Notes (Signed)
  Echocardiogram 2D Echocardiogram has been performed.  Jennette Dubin 08/12/2018, 1:26 PM

## 2018-08-12 NOTE — Care Management Note (Signed)
Case Management Note  Patient Details  Name: Thomas Park MRN: [id number] Date of Birth: 01-03-1991  Subjective/Objective:     Pt admitted with encephalitis. He is from home with family. Sister has been at the bedside.  No PCP, insurance.  Recent diagnosis of 042 but no meds for this on home med list.                Action/Plan: CM following and will get him an appointment with one of the [place] closer to d/c. Currently pt on IV antibiotics and having work up.  CM following for further d/c needs.  Expected Discharge Date:                  Expected Discharge Plan:     In-House Referral:     Discharge planning Services     Post Acute Care Choice:    Choice offered to:     DME Arranged:    DME Agency:     HH Arranged:    HH Agency:     Status of Service:  In process, will continue to follow  If discussed at Long Length of Stay Meetings, dates discussed:    Additional Comments:  Pollie Friar, RN 08/12/2018, 11:02 AM

## 2018-08-12 NOTE — Progress Notes (Signed)
Subjective:  Has no new complaints today but does have persistent loss of vision in his periphery.  He is also more confused telling me that his father who is at the bedside is 1 of his coworkers  Antibiotics:  Anti-infectives (From admission, onward)   Start     Dose/Rate Route Frequency Ordered Stop   08/10/18 1830  sulfamethoxazole-trimethoprim (BACTRIM) 306.08 mg in dextrose 5 % 250 mL IVPB     10 mg/kg/day  61.2 kg 269.1 mL/hr over 60 Minutes Intravenous Every 12 hours 08/10/18 1802        Medications: Scheduled Meds: . feeding supplement  1 Container Oral TID BM   Continuous Infusions: . dextrose 5 % and 0.9% NaCl 75 mL/hr at 08/12/18 0237  . sulfamethoxazole-trimethoprim 306.08 mg (08/12/18 0640)   PRN Meds:.acetaminophen **OR** acetaminophen, ondansetron **OR** ondansetron (ZOFRAN) IV    Objective: Weight change:   Intake/Output Summary (Last 24 hours) at 08/12/2018 1309 Last data filed at 08/12/2018 0640 Gross per 24 hour  Intake 30 ml  Output -  Net 30 ml   Blood pressure 101/62, pulse 77, temperature 99.8 F (37.7 C), temperature source Oral, resp. rate 16, weight 61.2 kg, SpO2 98 %. Temp:  [99 F (37.2 C)-100.5 F (38.1 C)] 99.8 F (37.7 C) (11/29 0733) Pulse Rate:  [77-118] 77 (11/29 0733) Resp:  [16-20] 16 (11/29 0733) BP: (96-108)/(57-62) 101/62 (11/29 0733) SpO2:  [98 %-99 %] 98 % (11/29 0733)  Physical Exam: General: Alert and awake, and to person but confused HEENT: anicteric sclera, EOMI, injected conjunctiva CVS regular rate, normal  Chest: , no wheezing, no respiratory distress Abdomen: soft non-distended,  Extremities: no edema or deformity noted bilaterally Skin: Rash on upper extremities it was there present yesterday Neuro: nonfocal  CBC:    BMET Recent Labs    08/11/18 0339 08/12/18 0617  NA 129* 128*  K 3.9 4.0  CL 101 100  CO2 22 21*  GLUCOSE 103* 93  BUN 7 7  CREATININE 0.87 0.96  CALCIUM 8.3* 8.5*      Liver Panel  Recent Labs    08/10/18 1221  PROT 8.0  ALBUMIN 3.3*  AST 35  ALT 21  ALKPHOS 46  BILITOT 0.5       Sedimentation Rate No results for input(s): ESRSEDRATE in the last 72 hours. C-Reactive Protein No results for input(s): CRP in the last 72 hours.  Micro Results: Recent Results (from the past 720 hour(s))  CSF culture     Status: None (Preliminary result)   Collection Time: 08/11/18 11:20 AM  Result Value Ref Range Status   Specimen Description CSF  Final   Special Requests NONE  Final   Gram Stain NO WBC SEEN NO ORGANISMS SEEN   Final   Culture   Final    NO GROWTH < 24 HOURS Performed at [facility], [address] [place], [place] [address]    Report Status PENDING  Incomplete    Studies/Results: Mr Jeri Cos And Wo Contrast  Result Date: 08/10/2018 CLINICAL DATA:  Confusion.  HIV. EXAM: MRI HEAD WITHOUT AND WITH CONTRAST TECHNIQUE: Multiplanar, multiecho pulse sequences of the brain and surrounding structures were obtained without and with intravenous contrast. CONTRAST:  7 cc Gadavist intravenous COMPARISON:  None. FINDINGS: Brain: Multiple foci of nodular and ring enhancement along the subcortical brain, bilateral cerebellum, left brainstem, and basal ganglia/deep white matter tracts. The largest and most avidly enhancing lesion is centered  at the left globus pallidus and measures 2.4 cm. This lesion also shows the greatest perilesional edema. There is restricted diffusion but this is at the periphery rather than the center of the lesions, incompatible with pyogenic abscess. No acute hemorrhage, hydrocephalus, or extra-axial collection. No prominence of dilated perivascular spaces and no abnormal meningeal enhancement. Toxoplasmosis is favored. Other infection such as neurocysticercosis or tuberculosis is possible. Lymphoma is not favored given multi focality. Metastatic disease also considered unlikely given age. Vascular: Major flow voids  are preserved. Skull and upper cervical spine: Current negative for marrow lesion Sinuses/Orbits: Negative IMPRESSION: 1. Numerous ring-enhancing lesions greatest at the left basal gangliawhich would classically represent toxoplasmosis in this setting (assuming matching CD4 level-which is currently pending). Other opportunistic infection is possible, including CNS tuberculosis, and needs correlation with labs. 2. Mild to moderate vasogenic edema with regional mass effect greatest at the left basal ganglia. Electronically Signed   By: Monte Fantasia M.D.   On: 08/10/2018 14:35   Dg Fluoro Guide Lumbar Puncture  Result Date: 08/11/2018 CLINICAL DATA:  Lumbar puncture. Patient is an HIV patient with abnormal MRI findings in the brain, suspicious for infection. EXAM: DIAGNOSTIC LUMBAR PUNCTURE UNDER FLUOROSCOPIC GUIDANCE FLUOROSCOPY TIME:  Fluoroscopy Time:  24 seconds Radiation Exposure Index (if provided by the fluoroscopic device): Not provided Number of Acquired Spot Images: 0 PROCEDURE: Informed consent was obtained from the patient prior to the procedure, including potential complications of headache, allergy, and pain. With the patient prone, the lower back was prepped with Betadine. 1% Lidocaine was used for local anesthesia. Lumbar puncture was performed at the L3 level using a 20 gauge needle with return of clear CSF with an opening pressure of 21 cm water. Thirty-two ml of CSF were obtained for laboratory studies. The patient tolerated the procedure well and there were no apparent complications. The closing pressure was 10 cm of water. IMPRESSION: Successful LP as above. Electronically Signed   By: Dorise Bullion III M.D   On: 08/11/2018 11:25      Assessment/Plan:  INTERVAL HISTORY: Status post successful lumbar puncture yesterday.  Opening pressure was not significant elevated significant CSF was removed for analysis  He is more confused to me when I saw him versus yesterday.   Active  Problems:   Encephalitis   Hyponatremia   Substance abuse ([place])    Cruz Vashti Hey [place] de Thomas Park is a 27 y.o. 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  He also has peripheral vision loss  #1 CNS lesions: He is currently on therapy for toxoplasmosis with intravenous Bactrim.  I am bothered that he is not improving yet.  CSF was negative for cryptococcal antigen he did not have CSF pleocytosis though he is leukopenic.  Multiple labs are pending and will take time to come back including his toxoplasma PCR, his CMV PCR of the CSF, the MTB PCR EBV PCR of CSF TB AFB stain and culturure  VDRL should come back sooner.  Going to order a chest x-ray even though he has no pulmonary symptoms to see if there is anything there suggestive of infection.  He does not have a cough or look like a person with active pulmonary TB but again he is immunocompromised.  Obtain blood cultures as well.  Would seem unusual for him to have brain abscesses in these territories with the CSF profile that he has.  Also ordering an  echocardiogram to look for potential endocarditis  I am contemplating adding antimicrobial therapy for bacteria on what he is getting with Bactrim though I am trying not to proceed to rationally.  Both myself and the father thought he was likely worse today versus yesterday though I spoke with the primary team and Dr. Cathlean Sauer he was fairly consistent between yesterday and today with intermittent confusion and periods of greater lucidity.  Certainly he could also have superimposed HIV encephalopathy though I think that would be unlikely to occur simultaneously with multiple CNS lesions.  I am adding a CMV PCR to CSF in case he has a CMV encephalitis superimposed as well.  2.  Peripheral vision loss: He needs a dilated funduscopic exam by ophthalmology to see if he has a  retinitis due to CMV or other opportunistic infection.  #3 HIV and AIDS: We cannot initiate therapy yet without knowing what the lesions in the brain are should he have tuberculosis for example we would need to delay therapy for minimum of 2 weeks but likely longer given that he has CNS pathology as we certainly do not want to cause an abrupt immune reconstitution syndrome    LOS: 2 days   Alcide Evener 08/12/2018, 1:09 PM

## 2018-08-12 NOTE — Progress Notes (Signed)
PROGRESS NOTE    Thomas Park  NLG:[id number] DOB: 02-27-1991 DOA: 08/10/2018 PCP: Patient, No Pcp Per    Brief Narrative:  27 year old male who has been recently diagnosed with HIV, describes a syndrome consistent with acute HIVfew months ago, with flu-like symptoms and disseminated rash. Over the last 5 days his condition has been complicated with worsening confusion and disorientation. On his initial physical examination he is only oriented to self, able to recognizehis family members and follow commands but otherwise he has significant confusion and disorientation.His blood pressure is 107/68, heart rate 84, oxygen saturation 100%, positive conjunctival erythema bilaterally, oral mucosa with no ulcers,lungs clear to auscultation bilaterally, heart S1-S2 present and rhythmic, abdomen soft nontender, lower extremity edema, diffuse macular rash that seems to be healing. Sodium 129, potassium 4.1, chloride 99, bicarb 22, glucose 95, BUN 11, creatinine 0.81, white count 3.8, hemoglobin 13.1, hematocrit 40.8, platelets 333.Brain MRI with numerous ring-enhancing lesions greatest at the left basal ganglia, consistent with toxoplasmosis. Mild to moderate vasogenic edema with regional mass-effect greatest at the left basal ganglia.  Patient will be admitted to the hospitalwith theworking diagnosis with acute encephalitis, likely due to toxoplasmosis in the setting of immunosuppression due to HIV.   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 Continue to be confused and disorientated, more somnolent today, able to follow commands. Will continue IV bactrim, so far patient has been afebrile. Consulted ophthalmology for a formal eye evaluation, rule out retinal involvement. Continue to have poor appetite and poor balance. Consult physical therapy.   2. Hyponatremia. Patient continue to  have hyponatremia, down to 128, possible central SIADH, will check urine osmolality, and urine electrolytes, will hold on further IV fluids and will follow on renal panel in am. Consult nutrition.  3. HIV/ AIDS. Severe immunosuppression with CD 4 count down to 10, will continue to follow on ID recommendations. For completion will check chest film.   4. Polysubstance abuse. No current signs of withdrawal.   DVT prophylaxis: scd   Code Status: full Family Communication: I spoke with patient's father at the bedside and all questions were addressed.  Disposition Plan/ discharge barriers: pending clinical improvement.   There is no height or weight on file to calculate BMI. Malnutrition Type:      Malnutrition Characteristics:      Nutrition Interventions:     RN Pressure Injury Documentation:     Consultants:   ID  Procedures:     Antimicrobials:   IV bactrim    Subjective: Patient continue to be confused and disorientated, this am more somnolent than yesterday. Limited history due to altered mentation.   Objective: Vitals:   08/11/18 1551 08/11/18 2010 08/12/18 0015 08/12/18 0733  BP: 108/62 (!) 106/59 (!) 96/57 101/62  Pulse: 89 87 (!) 118 77  Resp: 20   16  Temp: 99 F (37.2 C) (!) 100.5 F (38.1 C)  99.8 F (37.7 C)  TempSrc: Oral Oral  Oral  SpO2: 98% 99%  98%  Weight:        Intake/Output Summary (Last 24 hours) at 08/12/2018 0813 Last data filed at 08/12/2018 0640 Gross per 24 hour  Intake 30 ml  Output -  Net 30 ml   Filed Weights   08/10/18 1700  Weight: 61.2 kg    Examination:   General: deconditioned  Neurology: somnolent, confused and disorientated, non focal,  able to follow simple commands.  E ENT: mild pallor, no icterus, oral mucosa moist Cardiovascular: No JVD. S1-S2 present, rhythmic, no gallops, rubs, or murmurs. No lower extremity edema. Pulmonary: positive breath sounds bilaterally, adequate air movement, no  wheezing, rhonchi or rales. Gastrointestinal. Abdomen with no organomegaly, non tender, no rebound or guarding Skin. No rashes Musculoskeletal: no joint deformities     Data Reviewed: I have personally reviewed following labs and imaging studies  CBC: Recent Labs  Lab 08/10/18 1221 08/10/18 2148 08/11/18 0339 08/12/18 0617  WBC 3.8* 3.4* 3.9* 4.2  NEUTROABS 2.0  --   --  2.3  HGB 13.1 13.1 12.4* 12.9*  HCT 40.8 40.9 38.4* 39.1  MCV 85.4 83.5 83.3 83.0  PLT 333 348 334 741   Basic Metabolic Panel: Recent Labs  Lab 08/10/18 1221 08/10/18 2148 08/11/18 0339 08/12/18 0617  NA 129*  --  129* 128*  K 4.1  --  3.9 4.0  CL 99  --  101 100  CO2 22  --  22 21*  GLUCOSE 95  --  103* 93  BUN 11  --  7 7  CREATININE 0.81 0.77 0.87 0.96  CALCIUM 8.4*  --  8.3* 8.5*   GFR: CrCl cannot be calculated (Unknown ideal weight.). Liver Function Tests: Recent Labs  Lab 08/10/18 1221  AST 35  ALT 21  ALKPHOS 46  BILITOT 0.5  PROT 8.0  ALBUMIN 3.3*   No results for input(s): LIPASE, AMYLASE in the last 168 hours. Recent Labs  Lab 08/10/18 1221  AMMONIA 17   Coagulation Profile: No results for input(s): INR, PROTIME in the last 168 hours. Cardiac Enzymes: No results for input(s): CKTOTAL, CKMB, CKMBINDEX, TROPONINI in the last 168 hours. BNP (last 3 results) No results for input(s): PROBNP in the last 8760 hours. HbA1C: No results for input(s): HGBA1C in the last 72 hours. CBG: No results for input(s): GLUCAP in the last 168 hours. Lipid Profile: No results for input(s): CHOL, HDL, LDLCALC, TRIG, CHOLHDL, LDLDIRECT in the last 72 hours. Thyroid Function Tests: No results for input(s): TSH, T4TOTAL, FREET4, T3FREE, THYROIDAB in the last 72 hours. Anemia Panel: No results for input(s): VITAMINB12, FOLATE, FERRITIN, TIBC, IRON, RETICCTPCT in the last 72 hours.    Radiology Studies: I have reviewed all of the imaging during this hospital visit  personally     Scheduled Meds: . feeding supplement  1 Container Oral TID BM   Continuous Infusions: . dextrose 5 % and 0.9% NaCl 75 mL/hr at 08/12/18 0237  . sulfamethoxazole-trimethoprim 306.08 mg (08/12/18 0640)     LOS: 2 days        Jarett Dralle Gerome Apley, MD [facility] Pager [phone]

## 2018-08-12 NOTE — Progress Notes (Signed)
Initial Nutrition Assessment  DOCUMENTATION CODES:   Not applicable  INTERVENTION:   Continue Boost Breeze TID (each provides 250 kcal and 9 g protein) Ordered height to enable [place] to accurately estimate energy and protein needs   NUTRITION DIAGNOSIS:   Increased nutrient needs related to chronic illness as evidenced by estimated needs.  GOAL:   Patient will meet greater than or equal to 90% of their needs  MONITOR:   PO intake, Supplement acceptance, Labs, Weight trends  REASON FOR ASSESSMENT:   Malnutrition Screening Tool    ASSESSMENT:   27 yo male, admitted with encephalopathy. PMH significant for polysubstance abuse, recent HIV dx. Hx obtained from family at bedside.   Labs: sodium 128, corrected Ca WNL, Hgb 12.9 Meds: Boost Breeze TID, dextrose 5%-0.9% NaCl 75 mL/hr  Pt asleep at time of visit. Family speaks Spanish only - NT translated.  Per father, pt has lost a lot of weight suddenly over the last 2 months. Unable to provide a UBW or height.   Reports that the pt has been eating a little, but not a lot. Per nsg, pt did not eat breakfast this morning, but drank 1 whole Boost Breeze.   Father unsure what pt's typical diet. States pt used to work at [employer] and may have eaten the food there.  Per chart, emesis 11/28 (clear, undigested food).  Will continue Boost Breeze TID. Encouraged father to help pt get protein-rich foods with every meal.   NUTRITION - FOCUSED PHYSICAL EXAM:    Most Recent Value  Orbital Region  No depletion  Upper Arm Region  No depletion  Thoracic and Lumbar Region  Severe depletion  Buccal Region  Mild depletion  Temple Region  No depletion  Clavicle Bone Region  No depletion  Clavicle and Acromion Bone Region  No depletion  Scapular Bone Region  Unable to assess  Dorsal Hand  Unable to assess  Patellar Region  No depletion  Anterior Thigh Region  No depletion  Posterior Calf Region  No depletion  Edema (RD Assessment)   None  Hair  Reviewed  Eyes  Unable to assess  Mouth  Unable to assess  Skin  Reviewed  Nails  Reviewed      Diet Order:  No intake documented Diet Order            Diet regular Room service appropriate? Yes; Fluid consistency: Thin  Diet effective now              EDUCATION NEEDS:  Not appropriate for education at this time  Skin:  Skin Assessment: Reviewed RN Assessment  Last BM:  PTA  Height:  Ht Readings from Last 1 Encounters:  No data found for Ht    Weight:  Wt Readings from Last 1 Encounters:  08/10/18 61.2 kg    Ideal Body Weight:     BMI:  There is no height or weight on file to calculate BMI.  Estimated Nutritional Needs:   Kcal:  2070 calories daily (MSJ x 1.3)  Protein:  61-86 gm daily (1.0-1.4 g/kg ABW)  Fluid:  </= 2L daily or per MD discretion  * Estimated needs based on height of 5\' 10"  as no actual height is available.  Althea Grimmer, MS, RDN, LDN Pager: [phone]

## 2018-08-12 NOTE — Consult Note (Signed)
Reason for consult:  Rule out infectious retinopathy  HPI: Thomas Park is an 27 y.o. male.  He has HIV and is admitted for confusion.  He reports peripheral vision loss.  Past Medical History:  Diagnosis Date  . Anemia    [name] 08/10/2018  . HIV (human immunodeficiency virus infection) ([place])    dx'd 07/2018  . 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 NO PAST SURGERIES     History reviewed. No pertinent family history. Current Facility-Administered Medications  Medication Dose Route Frequency Provider Last Rate Last Dose  . acetaminophen (TYLENOL) tablet 650 mg  650 mg Oral Q6H PRN Arrien, Jimmy Picket, MD   650 mg at 08/12/18 2979   Or  . acetaminophen (TYLENOL) suppository 650 mg  650 mg Rectal Q6H PRN Arrien, Jimmy Picket, MD      . feeding supplement (BOOST / RESOURCE BREEZE) liquid 1 Container  1 Container Oral TID BM Arrien, Jimmy Picket, MD   1 Container at 08/12/18 1441  . multivitamins with iron tablet 1 tablet  1 tablet Oral Daily Arrien, Jimmy Picket, MD   1 tablet at 08/12/18 1509  . ondansetron (ZOFRAN) tablet 4 mg  4 mg Oral Q6H PRN Arrien, Jimmy Picket, MD       Or  . ondansetron [facility]) injection 4 mg  4 mg Intravenous Q6H PRN Arrien, Jimmy Picket, MD   4 mg at 08/12/18 [phone]  . sulfamethoxazole-trimethoprim (BACTRIM) 306.08 mg in dextrose 5 % 250 mL IVPB  10 mg/kg/day Intravenous Q12H Rumbarger, Rachel L, RPH 269.1 mL/hr at 08/12/18 0640 306.08 mg at 08/12/18 0640  . thiamine (VITAMIN B-1) tablet 100 mg  100 mg Oral Daily Arrien, Jimmy Picket, MD   100 mg at 08/12/18 1509   No Known Allergies Social History   Socioeconomic History  . Marital status: Single    Spouse name: Not on file  . Number of children: Not on file  . Years of education: Not on file  . Highest education level: Not on file  Occupational History  . Not on file  Social Needs  . Financial resource  strain: Not on file  . Food insecurity:    Worry: Not on file    Inability: Not on file  . Transportation needs:    Medical: Not on file    Non-medical: Not on file  Tobacco Use  . Smoking status: Never Smoker  . Smokeless tobacco: Never Used  Substance and Sexual Activity  . Alcohol use: Yes    Alcohol/week: 6.0 standard drinks    Types: 6 Cans of beer per week    Comment: 08/10/2018 "drink only on weekend"  . Drug use: Not Currently    Comment: 08/10/2018 "have tried coke; been a long time since I've usd any"  . Sexual activity: Not Currently  Lifestyle  . Physical activity:    Days per week: Not on file    Minutes per session: Not on file  . Stress: Not on file  Relationships  . Social connections:    Talks on phone: Not on file    Gets together: Not on file    Attends religious service: Not on file    Active member of club or organization: Not on file    Attends meetings of clubs or organizations: Not on file    Relationship status: Not on file  . Intimate partner violence:    Fear of current or  ex partner: Not on file    Emotionally abused: Not on file    Physically abused: Not on file    Forced sexual activity: Not on file  Other Topics Concern  . Not on file  Social History Narrative  . Not on file    Review of systems: ROS Patient confused.   Also, + Language barrier.  Physical Exam:  Blood pressure (!) 93/56, pulse 70, temperature 98.2 F (36.8 C), temperature source Oral, resp. rate 18, weight 61.2 kg, SpO2 98 %.   VA At least CF OU  Pupils:   OD round, reactive to light, no APD            OS round, reactive to light, no APD  IOP (T pen)  OD 15   OS  16  CVF: unable.  (Patient confused.  Will not fix gaze )  Motility:  Patient will not look to his right.  No other obvious restrictions  Balance/alignment:  Ortho by Luiz Ochoa   Bedside examination:                                 OD                                       External/adnexa: Normal                                       Lids/lashes:        Normal                                      Conjunctiva        White, quiet        Cornea:              Clear                  AC:                     Deep, quiet                                Iris:                     Normal        Lens:                  Clear                                       OS                                       External/adnexa: Normal                                      Lids/lashes:  Normal                                      Conjunctiva        White, quiet        Cornea:              Clear                  AC:                     Deep, quiet                                Iris:                     Normal        Lens:                  Clear       Dilated fundus exam: (Neo 2.5; Myd 1%)      OD Vitreous            Clear, quiet                                Optic Disc:       Normal, perfused                      Macula:             Flat                                            Vessels:           Normal caliber,distribution         Periphery:         Flat, attached, no hemorrhage                                      OS Vitreous            Clear, quiet                                Optic Disc:       Normal, perfused                      Macula:             Flat                                            Vessels:           Normal caliber,distribution         Periphery:         Flat, attached, no hemorrhage        NOTE:  EXAM LIMITED BY PATIENT'S RIGHT GAZE PARESIS Labs/studies: Results for orders  placed or performed during the hospital encounter of 08/10/18 (from the past 48 hour(s))  T-helper cells (CD4) count (not at [facility], [facility], [facility])     Status: Abnormal   Collection Time: 08/10/18  8:15 PM  Result Value Ref Range   CD4 T Cell Abs 10 (L) 400 - 2,700 /uL   CD4 % Helper T Cell 2 (L) 33 - 55 %    Comment: Performed at [facility], [place] [address], [place], [place] [address]   CBC     Status: Abnormal   Collection Time: 08/10/18  9:48 PM  Result Value Ref Range   WBC 3.4 (L) 4.0 - 10.5 K/uL   RBC 4.90 4.22 - 5.81 MIL/uL   Hemoglobin 13.1 13.0 - 17.0 g/dL   HCT 40.9 39.0 - 52.0 %   MCV 83.5 80.0 - 100.0 fL   MCH 26.7 26.0 - 34.0 pg   MCHC 32.0 30.0 - 36.0 g/dL   RDW 12.6 11.5 - 15.5 %   Platelets 348 150 - 400 K/uL   nRBC 0.0 0.0 - 0.2 %    Comment: Performed at [facility], [place] [address] [place], [place] [address]  Creatinine, serum     Status: None   Collection Time: 08/10/18  9:48 PM  Result Value Ref Range   Creatinine, Ser 0.77 0.61 - 1.24 mg/dL   GFR calc non Af Amer >60 >60 mL/min   GFR calc Af Amer >60 >60 mL/min    Comment: Performed at [place] [address], [place], [place] [address]  Cryptococcal antigen     Status: None   Collection Time: 08/10/18  9:48 PM  Result Value Ref Range   Crypto Ag NEGATIVE NEGATIVE   Cryptococcal Ag Titer NOT INDICATED NOT INDICATED    Comment: Performed at [facility], [place] [address], [place], [place] [address]  Toxoplasma gondii antibody, IgG     Status: Abnormal   Collection Time: 08/10/18  9:48 PM  Result Value Ref Range   Toxoplasma IgG Ratio >400.0 (H) 0.0 - 7.1 IU/mL    Comment: (NOTE)                                 Negative        <7.2                                 Equivocal  7.2 - 8.7                                 Positive        >8.7 Performed At: [facility] [place], [place] [id number] Rush Farmer MD [id number]   Basic metabolic panel     Status: Abnormal   Collection Time: 08/11/18  3:39 AM  Result Value Ref Range   Sodium 129 (L) 135 - 145 mmol/L   Potassium 3.9 3.5 - 5.1 mmol/L   Chloride 101 98 - 111 mmol/L   CO2 22 22 - 32 mmol/L   Glucose, Bld 103 (H) 70 - 99 mg/dL   BUN 7 6 - 20 mg/dL   Creatinine, Ser 0.87 0.61 - 1.24 mg/dL   Calcium 8.3 (L) 8.9 - 10.3 mg/dL   GFR calc non Af Amer >60 >60  mL/min   GFR calc Af Amer  >60 >60 mL/min   Anion gap 6 5 - 15    Comment: Performed at [place] [address], [place], [place] [address]  CBC     Status: Abnormal   Collection Time: 08/11/18  3:39 AM  Result Value Ref Range   WBC 3.9 (L) 4.0 - 10.5 K/uL   RBC 4.61 4.22 - 5.81 MIL/uL   Hemoglobin 12.4 (L) 13.0 - 17.0 g/dL   HCT 38.4 (L) 39.0 - 52.0 %   MCV 83.3 80.0 - 100.0 fL   MCH 26.9 26.0 - 34.0 pg   MCHC 32.3 30.0 - 36.0 g/dL   RDW 12.5 11.5 - 15.5 %   Platelets 334 150 - 400 K/uL   nRBC 0.0 0.0 - 0.2 %    Comment: Performed at [facility], [place] [address], [place], [place] [address]  Randell Patient vrs(ebv dna by pcr)     Status: None   Collection Time: 08/11/18  9:46 AM  Result Value Ref Range   EBV DNA QN by PCR Negative Negative copies/mL    Comment: (NOTE) No EBV DNA detected. The quantitative range of this assay is 100 to 1 million copies/mL. This test was developed and its performance characteristics determined by [facility]. It has not been cleared or approved by the Food and Drug Administration. Performed At: [facility] [place], [place] [id number] Rush Farmer MD [id number]    log10 EBV DNA Qn PCR UNABLE TO CALCULATE log10 copy/mL    Comment: (NOTE) Unable to calculate result since non-numeric result obtained for component test.   Cryptococcal antigen, CSF     Status: None   Collection Time: 08/11/18 11:20 AM  Result Value Ref Range   Crypto Ag NEGATIVE NEGATIVE   Cryptococcal Ag Titer NOT INDICATED NOT INDICATED    Comment: Performed at [facility], [address], [place], [place] [address]  Glucose, CSF     Status: None   Collection Time: 08/11/18 11:20 AM  Result Value Ref Range   Glucose, CSF 41 40 - 70 mg/dL    Comment: Performed at [place] [address] [place], [place] [address]  Protein, CSF     Status: Abnormal   Collection Time: 08/11/18 11:20 AM  Result Value Ref Range   Total  Protein, CSF 80 (H) 15 -  45 mg/dL    Comment: Performed at [place] [address] [place], [place] [address]  CSF cell count with differential     Status: Abnormal   Collection Time: 08/11/18 11:20 AM  Result Value Ref Range   Tube # 3    Color, CSF COLORLESS COLORLESS   Appearance, CSF CLEAR CLEAR   Supernatant NOT INDICATED    RBC Count, CSF 1 (H) 0 /cu mm   WBC, CSF 4 0 - 5 /cu mm   Segmented Neutrophils-CSF RARE 0 - 6 %   Lymphs, CSF MANY 40 - 80 %   Monocyte-Macrophage-Spinal Fluid FEW 15 - 45 %   Eosinophils, CSF RARE 0 - 1 %   Other Cells, CSF TOO FEW TO COUNT, SMEAR AVAILABLE FOR REVIEW     Comment: Performed at [facility] [address] [place],  [address]  CSF culture     Status: None (Preliminary result)   Collection Time: 08/11/18 11:20 AM  Result Value Ref Range   Specimen Description CSF  Special Requests NONE    Gram Stain NO WBC SEEN NO ORGANISMS SEEN     Culture      NO GROWTH < 24 HOURS Performed at [facility], [place] [address], [place], [place] [address]    Report Status PENDING   CBC with Differential/Platelet     Status: Abnormal   Collection Time: 08/12/18  6:17 AM  Result Value Ref Range   WBC 4.2 4.0 - 10.5 K/uL   RBC 4.71 4.22 - 5.81 MIL/uL   Hemoglobin 12.9 (L) 13.0 - 17.0 g/dL   HCT 39.1 39.0 - 52.0 %   MCV 83.0 80.0 - 100.0 fL   MCH 27.4 26.0 - 34.0 pg   MCHC 33.0 30.0 - 36.0 g/dL   RDW 12.6 11.5 - 15.5 %   Platelets 347 150 - 400 K/uL   nRBC 0.0 0.0 - 0.2 %   Neutrophils Relative % 54 %   Neutro Abs 2.3 1.7 - 7.7 K/uL   Lymphocytes Relative 24 %   Lymphs Abs 1.0 0.7 - 4.0 K/uL   Monocytes Relative 11 %   Monocytes Absolute 0.5 0.1 - 1.0 K/uL   Eosinophils Relative 9 %   Eosinophils Absolute 0.4 0.0 - 0.5 K/uL   Basophils Relative 1 %   Basophils Absolute 0.0 0.0 - 0.1 K/uL   Immature Granulocytes 1 %   Abs Immature Granulocytes 0.04 0.00 - 0.07 K/uL    Comment: Performed at [facility] [address]  [place], [place] [address]  Basic metabolic panel     Status: Abnormal   Collection Time: 08/12/18  6:17 AM  Result Value Ref Range   Sodium 128 (L) 135 - 145 mmol/L   Potassium 4.0 3.5 - 5.1 mmol/L   Chloride 100 98 - 111 mmol/L   CO2 21 (L) 22 - 32 mmol/L   Glucose, Bld 93 70 - 99 mg/dL   BUN 7 6 - 20 mg/dL   Creatinine, Ser 0.96 0.61 - 1.24 mg/dL   Calcium 8.5 (L) 8.9 - 10.3 mg/dL   GFR calc non Af Amer >60 >60 mL/min   GFR calc Af Amer >60 >60 mL/min   Anion gap 7 5 - 15    Comment: Performed at [facility] [address] [place], [place] [address]   Dg Chest 2 View  Result Date: 08/12/2018 CLINICAL DATA:  Brain lesion.  History of HIV. EXAM: CHEST - 2 VIEW COMPARISON:  MRI 08/10/2018. FINDINGS: Mediastinum and hilar structures normal. Cardiomegaly with normal pulmonary vascularity. Mild right base subsegmental atelectasis/infiltrate. No pleural effusion or pneumothorax. No acute bony abnormality. IMPRESSION: Mild right base subsegmental atelectasis/infiltrate. Electronically Signed   By: Marcello Moores  Register   On: 08/12/2018 15:50   Dg Fluoro Guide Lumbar Puncture  Result Date: 08/11/2018 CLINICAL DATA:  Lumbar puncture. Patient is an HIV patient with abnormal MRI findings in the brain, suspicious for infection. EXAM: DIAGNOSTIC LUMBAR PUNCTURE UNDER FLUOROSCOPIC GUIDANCE FLUOROSCOPY TIME:  Fluoroscopy Time:  24 seconds Radiation Exposure Index (if provided by the fluoroscopic device): Not provided Number of Acquired Spot Images: 0 PROCEDURE: Informed consent was obtained from the patient prior to the procedure, including potential complications of headache, allergy, and pain. With the patient prone, the lower back was prepped with Betadine. 1% Lidocaine was used for local anesthesia. Lumbar puncture was performed at the L3 level using a 20 gauge needle with return of clear CSF with an opening pressure of 21 cm water. Thirty-two ml  of CSF were obtained for laboratory studies. The patient  tolerated the procedure well and there were no apparent complications. The closing pressure was 10 cm of water. IMPRESSION: Successful LP as above. Electronically Signed   By: Dorise Bullion III M.D   On: 08/11/2018 11:25                             Assessment and Plan: HIV without retinopathy.   I believe his right gaze paresis and his complaint of visual field defect are secondary to his CNS lesions.  (Highly suspicious for Toxoplasmosis.)   Fortunately, his eyes look OK.  Exam limited by patient inability to look to his right.  All of the above information was relayed to the patient and/or patient family.  All questions were answered.   Ephrata L 08/12/2018, 5:32 PM  [facility] Ophthalmology [phone]

## 2018-08-12 NOTE — Progress Notes (Signed)
Tele interpreter used for shift assessment. Thomas Park

## 2018-08-13 DIAGNOSIS — B589 Toxoplasmosis, unspecified: Secondary | ICD-10-CM

## 2018-08-13 DIAGNOSIS — G939 Disorder of brain, unspecified: Secondary | ICD-10-CM

## 2018-08-13 DIAGNOSIS — R410 Disorientation, unspecified: Secondary | ICD-10-CM

## 2018-08-13 LAB — CBC WITH DIFFERENTIAL/PLATELET
Abs Immature Granulocytes: 0.05 10*3/uL (ref 0.00–0.07)
Basophils Absolute: 0 10*3/uL (ref 0.0–0.1)
Basophils Relative: 1 %
Eosinophils Absolute: 0.6 10*3/uL — ABNORMAL HIGH (ref 0.0–0.5)
Eosinophils Relative: 14 %
HCT: 39.2 % (ref 39.0–52.0)
Hemoglobin: 12.8 g/dL — ABNORMAL LOW (ref 13.0–17.0)
Immature Granulocytes: 1 %
LYMPHS ABS: 1.1 10*3/uL (ref 0.7–4.0)
Lymphocytes Relative: 26 %
MCH: 27.6 pg (ref 26.0–34.0)
MCHC: 32.7 g/dL (ref 30.0–36.0)
MCV: 84.7 fL (ref 80.0–100.0)
Monocytes Absolute: 0.6 10*3/uL (ref 0.1–1.0)
Monocytes Relative: 13 %
Neutro Abs: 1.8 10*3/uL (ref 1.7–7.7)
Neutrophils Relative %: 45 %
Platelets: 315 10*3/uL (ref 150–400)
RBC: 4.63 MIL/uL (ref 4.22–5.81)
RDW: 13 % (ref 11.5–15.5)
WBC: 4.1 10*3/uL (ref 4.0–10.5)
nRBC: 0 % (ref 0.0–0.2)

## 2018-08-13 LAB — HCV COMMENT:

## 2018-08-13 LAB — BASIC METABOLIC PANEL
Anion gap: 11 (ref 5–15)
BUN: 9 mg/dL (ref 6–20)
CHLORIDE: 99 mmol/L (ref 98–111)
CO2: 18 mmol/L — ABNORMAL LOW (ref 22–32)
Calcium: 8.5 mg/dL — ABNORMAL LOW (ref 8.9–10.3)
Creatinine, Ser: 1.03 mg/dL (ref 0.61–1.24)
GFR calc Af Amer: >60 mL/min (ref >60)
GFR calc non Af Amer: >60 mL/min (ref >60)
Glucose, Bld: 92 mg/dL (ref 70–99)
Potassium: 4.4 mmol/L (ref 3.5–5.1)
Sodium: 128 mmol/L — ABNORMAL LOW (ref 135–145)

## 2018-08-13 LAB — HEPATITIS B SURFACE ANTIGEN: Hepatitis B Surface Ag: NEGATIVE

## 2018-08-13 LAB — HSV DNA BY PCR (REFERENCE LAB)
HSV 1 DNA: NEGATIVE
HSV 2 DNA: NEGATIVE

## 2018-08-13 LAB — TOXOPLASMA GONDII, PCR: Toxoplasma Gondii, PCR: POSITIVE — AB

## 2018-08-13 LAB — RPR
RPR Ser Ql: NONREACTIVE
RPR Ser Ql: NONREACTIVE

## 2018-08-13 LAB — HEPATITIS B SURFACE ANTIBODY, QUANTITATIVE: Hep B S AB Quant (Post): <3.1 m[IU]/mL — ABNORMAL LOW (ref >9.9)

## 2018-08-13 LAB — CMV DNA, QUANTITATIVE, PCR
CMV DNA Quant: 2340 IU/mL
Log10 CMV Qn DNA Pl: 3.369 log10 IU/mL

## 2018-08-13 LAB — HERPES SIMPLEX VIRUS(HSV) DNA BY PCR

## 2018-08-13 LAB — HEPATITIS A ANTIBODY, TOTAL: Hep A Total Ab: POSITIVE — AB

## 2018-08-13 LAB — HEPATITIS C ANTIBODY (REFLEX): HCV Ab: 0.1 s/co ratio (ref 0.0–0.9)

## 2018-08-13 MED ORDER — ATOVAQUONE 750 MG/5ML PO SUSP
750.0000 mg | Freq: Two times a day (BID) | ORAL | Status: DC
  Administered 2018-08-13 – 2018-08-18 (×10): 750 mg via ORAL
  Filled 2018-08-13 (×14): qty 5

## 2018-08-13 MED ORDER — CLINDAMYCIN PHOSPHATE 900 MG/50ML IV SOLN
900.0000 mg | Freq: Three times a day (TID) | INTRAVENOUS | Status: DC
  Administered 2018-08-13 – 2018-08-18 (×16): 900 mg via INTRAVENOUS
  Filled 2018-08-13 (×17): qty 50

## 2018-08-13 MED ORDER — ATOVAQUONE 750 MG/5ML PO SUSP
750.0000 mg | ORAL | Status: AC
  Administered 2018-08-13: 750 mg via ORAL
  Filled 2018-08-13 (×2): qty 5

## 2018-08-13 NOTE — Evaluation (Signed)
Physical Therapy Evaluation Patient Details Name: Thomas Park MRN: [id number] DOB: 05-20-1991 Today's Date: 08/13/2018   History of Present Illness  27 yo male, admitted with encephalopathy. PMH significant for polysubstance abuse, recent HIV dx  Clinical Impression  Orders received for PT evaluation, utilized tele interpreter throughout session Audelia Hives [phone]). Patient demonstrates deficits in functional mobility as indicated below. Will benefit from continued skilled PT to address deficits and maximize function.   At this time, patient is extremely unsteady with significant ataxic deficits requiring moderate to maximal assist in standing/walking attempts. Additionally, patient with cognitive and behavioral concerns per father. Given prior level of independence and current deficits, feel patient may benefit from comprehensive inpatient therapies. Will see as indicated and progress as tolerated.     Follow Up Recommendations CIR    Equipment Recommendations  (TBD)    Recommendations for Other Services Rehab consult     Precautions / Restrictions Precautions Precautions: Fall Restrictions Weight Bearing Restrictions: No      Mobility  Bed Mobility Overal bed mobility: Needs Assistance Bed Mobility: Supine to Sit;Sit to Supine     Supine to sit: Min assist Sit to supine: Min assist   General bed mobility comments: min assist for safety, patient moving impulsively   Transfers Overall transfer level: Needs assistance Equipment used: 1 person hand held assist Transfers: Sit to/from Stand Sit to Stand: Min assist         General transfer comment: min assist for stability once powered up  Ambulation/Gait Ambulation/Gait assistance: Max assist Gait Distance (Feet): 4 Feet Assistive device: 1 person hand held assist Gait Pattern/deviations: Ataxic;Step-to pattern;Decreased stride length;Staggering right;Staggering left Gait velocity: decreased   General  Gait Details: patient unable to coordinate or control movement in any direction, max assist with noted forward propulsion  Stairs            Wheelchair Mobility    Modified Rankin (Stroke Patients Only)       Balance                                             Pertinent Vitals/Pain Pain Assessment: Faces Faces Pain Scale: Hurts even more Pain Location: unable to determine    Home Living Family/patient expects to be discharged to:: Private residence Living Arrangements: Other relatives Available Help at Discharge: Family(lives with sister) Type of Home: House Home Access: Level entry     Home Layout: One level Home Equipment: None      Prior Function Level of Independence: Independent         Comments: was working every day prior to this admission     Hand Dominance   Dominant Hand: Right    Extremity/Trunk Assessment   Upper Extremity Assessment Upper Extremity Assessment: Generalized weakness;Difficult to assess due to impaired cognition    Lower Extremity Assessment Lower Extremity Assessment: Generalized weakness;Difficult to assess due to impaired cognition       Communication   Communication: Interpreter utilized(manuel [phone])  Cognition Arousal/Alertness: Awake/alert Behavior During Therapy: Restless Overall Cognitive Status: Difficult to assess Area of Impairment: Problem solving;Safety/judgement;Awareness;Attention;Following commands                   Current Attention Level: Sustained   Following Commands: Follows one step commands inconsistently;Follows one step commands with increased time Safety/Judgement: Decreased awareness of safety;Decreased awareness of deficits Awareness:  Intellectual Problem Solving: Slow processing;Difficulty sequencing;Requires verbal cues;Requires tactile cues;Decreased initiation General Comments: difficult to determine true cognitive impairment vs language barrier despite  interpreter use      General Comments      Exercises     Assessment/Plan    PT Assessment Patient needs continued PT services  PT Problem List Decreased activity tolerance;Decreased strength;Decreased balance;Decreased mobility;Decreased coordination;Decreased cognition;Decreased safety awareness;Pain       PT Treatment Interventions DME instruction;Gait training;Functional mobility training;Therapeutic activities;Therapeutic exercise;Patient/family education;Balance training;Cognitive remediation;Neuromuscular re-education    PT Goals (Current goals can be found in the Care Plan section)  Acute Rehab PT Goals Patient Stated Goal: to get better PT Goal Formulation: With family Time For Goal Achievement: 08/27/18 Potential to Achieve Goals: Good    Frequency Min 3X/week   Barriers to discharge Decreased caregiver support      Co-evaluation               AM-PAC PT "6 Clicks" Mobility  Outcome Measure Help needed turning from your back to your side while in a flat bed without using bedrails?: A Little Help needed moving from lying on your back to sitting on the side of a flat bed without using bedrails?: A Little Help needed moving to and from a bed to a chair (including a wheelchair)?: A Lot Help needed standing up from a chair using your arms (e.g., wheelchair or bedside chair)?: A Lot Help needed to walk in hospital room?: A Lot Help needed climbing 3-5 steps with a railing? : A Lot 6 Click Score: 14    End of Session Equipment Utilized During Treatment: Gait belt Activity Tolerance: Patient limited by fatigue Patient left: in bed;with call bell/phone within reach;with bed alarm set;with family/visitor present Nurse Communication: Mobility status PT Visit Diagnosis: Unsteadiness on feet (R26.81);Other symptoms and signs involving the nervous system (R29.898)    Time: 6812-7517 PT Time Calculation (min) (ACUTE ONLY): 19 min   Charges:   PT Evaluation $PT  Eval Moderate Complexity: 1 Mod          Alben Deeds, PT DPT  Board Certified Neurologic Specialist Acute Rehabilitation Services Pager [phone] Office [phone]   Duncan Dull 08/13/2018, 3:03 PM

## 2018-08-13 NOTE — Progress Notes (Signed)
The patient had 1 episode of emesis this morning which appeared to be undigested food from breakfast. The patient was assessed using the video interpreter. The patient says he feels better after vomiting and declines any medication for nausea. Reminded the patient to eat his meals slowly, while sitting upright. Will continue to monitor and assess the patient.

## 2018-08-13 NOTE — Progress Notes (Signed)
Pharmacy Antibiotic Note  Thomas Park Thomas Park de Thomas Park Thomas Park is a 27 y.o. 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 Pt has diffuse rash on Bactrim for his toxo treatment. Unfortunately, we have no pyrimethamine on hand. After discussion with Dr. Tommy Medal, we will have to use IV clinda+atovaquone for the moment until we can order the pyrimethamine from the compounding pharmacy.   Plan: Dc septra Clinda 900mg  IV q8 Atovaquone 750mg  PO BID   Weight: 135 lb (61.2 kg)(estimated)  Temp (24hrs), Avg:98.3 F (36.8 C), Min:98 F (36.7 C), Max:98.8 F (37.1 C)  Recent Labs  Lab 08/10/18 1221 08/10/18 2148 08/11/18 0339 08/12/18 0617 08/13/18 0505  WBC 3.8* 3.4* 3.9* 4.2 4.1  CREATININE 0.81 0.77 0.87 0.96 1.03    CrCl cannot be calculated (Unknown ideal weight.).    No Known Allergies  Antimicrobials this admission: Bactrim 11/27>>11/30 Atovaquone 11/30>> Clinda 11/30>>  Dose adjustments this admission: N/A  Microbiology results: 11/30 blood>> 11/30 CSF>> 11/28 toxo>> greater than 400 11/28 crypto neg  Thomas Park, PharmD, BCIDP, AAHIVP, CPP Infectious Disease Pharmacist 08/13/2018 12:02 PM

## 2018-08-13 NOTE — Progress Notes (Signed)
Subjective:  Is very agitated this morning and planing of intense diffuse pruritus.  He does not recognize his father he does not know where he is  Antibiotics:  Anti-infectives (From admission, onward)   Start     Dose/Rate Route Frequency Ordered Stop   08/13/18 2000  atovaquone (MEPRON) 750 MG/5ML suspension 750 mg     750 mg Oral 2 times daily with meals 08/13/18 1154     08/13/18 1230  clindamycin (CLEOCIN) IVPB 900 mg     900 mg 100 mL/hr over 30 Minutes Intravenous Every 8 hours 08/13/18 1154     08/13/18 1230  atovaquone (MEPRON) 750 MG/5ML suspension 750 mg     750 mg Oral NOW 08/13/18 1154 08/13/18 1330   08/10/18 1830  sulfamethoxazole-trimethoprim (BACTRIM) 306.08 mg in dextrose 5 % 250 mL IVPB  Status:  Discontinued     10 mg/kg/day  61.2 kg 269.1 mL/hr over 60 Minutes Intravenous Every 12 hours 08/10/18 1802 08/13/18 1154      Medications: Scheduled Meds: . atovaquone  750 mg Oral BID WC  . feeding supplement  1 Container Oral TID BM  . multivitamins with iron  1 tablet Oral Daily  . thiamine  100 mg Oral Daily   Continuous Infusions: . clindamycin (CLEOCIN) IV 900 mg (08/13/18 1327)   PRN Meds:.acetaminophen **OR** acetaminophen, ondansetron **OR** ondansetron (ZOFRAN) IV    Objective: Weight change:  No intake or output data in the 24 hours ending 08/13/18 1407 Blood pressure 108/65, pulse 82, temperature 99 F (37.2 C), temperature source Oral, resp. rate 16, weight 61.2 kg, SpO2 100 %. Temp:  [98 F (36.7 C)-99 F (37.2 C)] 99 F (37.2 C) (11/30 1230) Pulse Rate:  [70-91] 82 (11/30 1230) Resp:  [16-21] 16 (11/30 1230) BP: (93-110)/(56-75) 108/65 (11/30 1230) SpO2:  [97 %-100 %] 100 % (11/30 1230)  Physical Exam: General: Alert used agitated cooperative HEENT: anicteric sclera, EOMI, injected conjunctiva CVS regular rate, normal  Chest: , no wheezing, no respiratory distress Abdomen: soft non-distended,  Extremities: no edema or  deformity noted bilaterally Skin: Rash on upper extremities Neuro no new focal deficits  CBC:    BMET Recent Labs    08/12/18 0617 08/13/18 0505  NA 128* 128*  K 4.0 4.4  CL 100 99  CO2 21* 18*  GLUCOSE 93 92  BUN 7 9  CREATININE 0.96 1.03  CALCIUM 8.5* 8.5*     Liver Panel  No results for input(s): PROT, ALBUMIN, AST, ALT, ALKPHOS, BILITOT, BILIDIR, IBILI in the last 72 hours.     Sedimentation Rate No results for input(s): ESRSEDRATE in the last 72 hours. C-Reactive Protein No results for input(s): CRP in the last 72 hours.  Micro Results: Recent Results (from the past 720 hour(s))  Anaerobic culture     Status: None (Preliminary result)   Collection Time: 08/11/18 11:20 AM  Result Value Ref Range Status   Specimen Description CSF  Final   Special Requests NONE  Final   Gram Stain PENDING  Incomplete   Culture   Final    NO GROWTH 2 DAYS Performed at [facility], [address] [place],  [address]    Report Status PENDING  Incomplete  CSF culture     Status: None (Preliminary result)   Collection Time: 08/11/18 11:20 AM  Result Value Ref Range Status   Specimen Description CSF  Final   Special Requests NONE  Final  Gram Stain NO WBC SEEN NO ORGANISMS SEEN   Final   Culture   Final    NO GROWTH 2 DAYS Performed at [facility], [place] [address], [place], [place] [address]    Report Status PENDING  Incomplete  Culture, blood (Routine X 2) w Reflex to ID Panel     Status: None (Preliminary result)   Collection Time: 08/12/18 11:12 AM  Result Value Ref Range Status   Specimen Description BLOOD RIGHT ANTECUBITAL  Final   Special Requests   Final    BOTTLES DRAWN AEROBIC AND ANAEROBIC Blood Culture adequate volume   Culture   Final    NO GROWTH < 24 HOURS Performed at [facility], [place] [address], [place], [place] [address]    Report Status PENDING  Incomplete  Culture, blood (Routine X 2) w Reflex to ID Panel     Status:  None (Preliminary result)   Collection Time: 08/12/18 11:19 AM  Result Value Ref Range Status   Specimen Description BLOOD LEFT ANTECUBITAL  Final   Special Requests   Final    BOTTLES DRAWN AEROBIC AND ANAEROBIC Blood Culture adequate volume   Culture   Final    NO GROWTH < 24 HOURS Performed at [facility], [place] [address] [place],  [address]    Report Status PENDING  Incomplete    Studies/Results: Dg Chest 2 View  Result Date: 08/12/2018 CLINICAL DATA:  Brain lesion.  History of HIV. EXAM: CHEST - 2 VIEW COMPARISON:  MRI 08/10/2018. FINDINGS: Mediastinum and hilar structures normal. Cardiomegaly with normal pulmonary vascularity. Mild right base subsegmental atelectasis/infiltrate. No pleural effusion or pneumothorax. No acute bony abnormality. IMPRESSION: Mild right base subsegmental atelectasis/infiltrate. Electronically Signed   By: Marcello Moores  Register   On: 08/12/2018 15:50      Assessment/Plan:  INTERVAL HISTORY:   She has been seen by ophthalmology found no evidence of retinitis or ophthalmic infection  She is toxoplasma IgG came back strongly positive RPR negative His HIV viral load is over 300,000  2D echocardiogram showed filamentous structure on the right atrium   Active Problems:   Encephalitis   Hyponatremia   Substance abuse ([place])    Cruz Vashti Hey [name] is a 27 y.o. 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  He also has peripheral vision loss  #1 CNS lesions:   My suspicion is strongest for toxoplasmosis.  Unfortunately he is experiencing intense pruritus which could be related to the IV Bactrim therefore we are changing him to clindamycin and atovaquone while we await ordering of pyrimethamine from the compounding pharmacy.  Given the finding on 2D echocardiogram I think is reasonable to obtain a TEE because  certainly endocarditis with septic emboli to the brain and brain abscesses is in the differential   Multiple labs are pending and will take time to come back including his toxoplasma PCR, his CMV PCR of the CSF, the MTB PCR EBV PCR of CSF TB AFB stain and culturure  Greatly appreciate ID pharmacy [facility]  Pham's help)  2.  Peripheral vision loss: See Dr. Benna Dunks note. Greatly appreciate her ruling out ocular infection  #3 HIV and AIDS: We cannot initiate therapy yet without knowing what the lesions in the brain are should he have tuberculosis for example we would need to delay therapy for minimum of 2 weeks but likely longer given that  he has CNS pathology as we certainly do not want to cause an abrupt immune reconstitution syndrome      LOS: 3 days   Alcide Evener 08/13/2018, 2:07 PM

## 2018-08-13 NOTE — Progress Notes (Signed)
PROGRESS NOTE    Thomas Park  LZJ:[id number] DOB: 03-16-1991 DOA: 08/10/2018 PCP: Patient, No Pcp Per    Brief Narrative:  27 year old male who has been recently diagnosed with HIV, describes a syndrome consistent with acute HIVfew months ago, with flu-like symptoms and disseminated rash. Over the last 5 days his condition has been complicated with worsening confusion and disorientation. On his initial physical examination he is only oriented to self, able to recognizehis family members and follow commands but otherwise he has significant confusion and disorientation.His blood pressure is 107/68, heart rate 84, oxygen saturation 100%, positive conjunctival erythema bilaterally, oral mucosa with no ulcers,lungs clear to auscultation bilaterally, heart S1-S2 present and rhythmic, abdomen soft nontender, lower extremity edema, diffuse macular rash that seems to be healing. Sodium 129, potassium 4.1, chloride 99, bicarb 22, glucose 95, BUN 11, creatinine 0.81, white count 3.8, hemoglobin 13.1, hematocrit 40.8, platelets 333.Brain MRI with numerous ring-enhancing lesions greatest at the left basal ganglia, consistent with toxoplasmosis. Mild to moderate vasogenic edema with regional mass-effect greatest at the left basal ganglia.  Patient will be admitted to the hospitalwith theworking diagnosis with acute encephalitis, likely due to toxoplasmosis in the setting of immunosuppression due to HIV.   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 Mentation seems to be stable, still confused and disorientated, per patient's family at the bedside patient has episodes when is more lucid. No eye involvement per ophthalmology evaluation. Will continue antibiotic therapy per ID recommendations with IV clindamycin and atovaquone. Out of bed as tolerated and physical  therapy evaluation.   2. Hyponatremia due to SIADH. Serum Na has been stable at 128, will continue to hold on IV fluids, patient with poor oral intake, will hold on fluid restriction for now. If no further improvement will consider Na tablets in am.   3. HIV/ AIDS. Severe immunosuppression with CD 4 count down to 10./ high viral load.  Chest film with no infiltrates. Hepatitis A, B, C, HSV have been negative. RPR negative, gold TB test non reactive. Echocardiogram with normal LV systolic function, noted filamentous structure in the RA possible anatomic variant, blood cultures have been negative and patient has been afebrile. Will wait for ID recommendations before requesting TEE.    4. Polysubstance abuse. No clinical signs of active withdrawal.   DVT prophylaxis:scd Code Status:full Family Communication:I spoke with patient'sfatherat the bedside and all questions were addressed.  Disposition Plan/ discharge barriers:pending clinical improvement.  There is no height or weight on file to calculate BMI. Malnutrition Type:  Nutrition Problem: Increased nutrient needs Etiology: chronic illness   Malnutrition Characteristics:  Signs/Symptoms: estimated needs   Nutrition Interventions:  Interventions: Boost Breeze  RN Pressure Injury Documentation:     Consultants:   ID  Procedures:     Antimicrobials:   Clindamycin  Atovaquone     Subjective: Patient has remained confused and disorientated, per his father at the bedside patient had episodes of being more lucid. He has been very somnolent, intermittent involuntary movements.   Objective: Vitals:   08/12/18 1900 08/12/18 2315 08/13/18 0400 08/13/18 0730  BP: 100/69 95/75 101/62 (!) 110/56  Pulse: 72 88 87 91  Resp: 18 17 (!) 21 18  Temp: 98.8 F (37.1 C) 98.3 F (36.8 C) 98.4 F (36.9 C) 98 F (36.7 C)  TempSrc: Oral Oral Axillary Oral  SpO2:  97% 99% 100% 97%  Weight:       No intake or  output data in the 24 hours ending 08/13/18 1030 Filed Weights   08/10/18 1700  Weight: 61.2 kg    Examination:   General: deconditioned  Neurology: Awake , follow simple commands, orientated times place and person but not time. No headache but double vision.  E ENT: mild pallor, no icterus, oral mucosa moist Cardiovascular: No JVD. S1-S2 present, rhythmic, no gallops, rubs, or murmurs. No lower extremity edema. Pulmonary: positive breath sounds bilaterally, adequate air movement, no wheezing, rhonchi or rales. Gastrointestinal. Abdomen with no organomegaly, non tender, no rebound or guarding Skin. No rashes Musculoskeletal: no joint deformities     Data Reviewed: I have personally reviewed following labs and imaging studies  CBC: Recent Labs  Lab 08/10/18 1221 08/10/18 2148 08/11/18 0339 08/12/18 0617 08/13/18 0505  WBC 3.8* 3.4* 3.9* 4.2 4.1  NEUTROABS 2.0  --   --  2.3 1.8  HGB 13.1 13.1 12.4* 12.9* 12.8*  HCT 40.8 40.9 38.4* 39.1 39.2  MCV 85.4 83.5 83.3 83.0 84.7  PLT 333 348 334 347 549   Basic Metabolic Panel: Recent Labs  Lab 08/10/18 1221 08/10/18 2148 08/11/18 0339 08/12/18 0617 08/13/18 0505  NA 129*  --  129* 128* 128*  K 4.1  --  3.9 4.0 4.4  CL 99  --  101 100 99  CO2 22  --  22 21* 18*  GLUCOSE 95  --  103* 93 92  BUN 11  --  7 7 9   CREATININE 0.81 0.77 0.87 0.96 1.03  CALCIUM 8.4*  --  8.3* 8.5* 8.5*   GFR: CrCl cannot be calculated (Unknown ideal weight.). Liver Function Tests: Recent Labs  Lab 08/10/18 1221  AST 35  ALT 21  ALKPHOS 46  BILITOT 0.5  PROT 8.0  ALBUMIN 3.3*   No results for input(s): LIPASE, AMYLASE in the last 168 hours. Recent Labs  Lab 08/10/18 1221  AMMONIA 17   Coagulation Profile: No results for input(s): INR, PROTIME in the last 168 hours. Cardiac Enzymes: No results for input(s): CKTOTAL, CKMB, CKMBINDEX, TROPONINI in the last 168 hours. BNP (last 3 results) No results for input(s): PROBNP in the  last 8760 hours. HbA1C: No results for input(s): HGBA1C in the last 72 hours. CBG: No results for input(s): GLUCAP in the last 168 hours. Lipid Profile: No results for input(s): CHOL, HDL, LDLCALC, TRIG, CHOLHDL, LDLDIRECT in the last 72 hours. Thyroid Function Tests: No results for input(s): TSH, T4TOTAL, FREET4, T3FREE, THYROIDAB in the last 72 hours. Anemia Panel: No results for input(s): VITAMINB12, FOLATE, FERRITIN, TIBC, IRON, RETICCTPCT in the last 72 hours.    Radiology Studies: I have reviewed all of the imaging during this hospital visit personally     Scheduled Meds: . feeding supplement  1 Container Oral TID BM  . multivitamins with iron  1 tablet Oral Daily  . thiamine  100 mg Oral Daily   Continuous Infusions: . sulfamethoxazole-trimethoprim 306.08 mg (08/13/18 0631)     LOS: 3 days        Kypton Eltringham Gerome Apley, MD [facility] Pager [phone]

## 2018-08-13 NOTE — Progress Notes (Signed)
Rehab Admissions Coordinator Note:  Patient was screened by Cleatrice Burke for appropriateness for an Inpatient Acute Rehab Consult per PT recommendation.   At this time, we are recommending Inpatient Rehab consult. Please place order for consult.  Cleatrice Burke 08/13/2018, 6:40 PM  I can be reached at [phone].

## 2018-08-13 NOTE — Progress Notes (Signed)
Used interpreter line and spoke with Effie Shy # [phone] to assist with patient assessment.

## 2018-08-14 ENCOUNTER — Inpatient Hospital Stay (HOSPITAL_COMMUNITY): Payer: Self-pay

## 2018-08-14 ENCOUNTER — Encounter (HOSPITAL_COMMUNITY): Payer: Self-pay | Admitting: Radiology

## 2018-08-14 LAB — BASIC METABOLIC PANEL
Anion gap: 11 (ref 5–15)
BUN: 12 mg/dL (ref 6–20)
CO2: 17 mmol/L — ABNORMAL LOW (ref 22–32)
Calcium: 8.7 mg/dL — ABNORMAL LOW (ref 8.9–10.3)
Chloride: 102 mmol/L (ref 98–111)
Creatinine, Ser: 0.91 mg/dL (ref 0.61–1.24)
GFR calc Af Amer: >60 mL/min (ref >60)
GFR calc non Af Amer: >60 mL/min (ref >60)
Glucose, Bld: 87 mg/dL (ref 70–99)
Potassium: 4.6 mmol/L (ref 3.5–5.1)
Sodium: 130 mmol/L — ABNORMAL LOW (ref 135–145)

## 2018-08-14 LAB — CSF CULTURE W GRAM STAIN
Culture: NO GROWTH
Gram Stain: NONE SEEN

## 2018-08-14 LAB — CBC WITH DIFFERENTIAL/PLATELET
ABS IMMATURE GRANULOCYTES: 0.07 10*3/uL (ref 0.00–0.07)
Basophils Absolute: 0.1 10*3/uL (ref 0.0–0.1)
Basophils Relative: 1 %
Eosinophils Absolute: 0.7 10*3/uL — ABNORMAL HIGH (ref 0.0–0.5)
Eosinophils Relative: 14 %
HCT: 41.3 % (ref 39.0–52.0)
HEMOGLOBIN: 13.6 g/dL (ref 13.0–17.0)
Immature Granulocytes: 1 %
Lymphocytes Relative: 18 %
Lymphs Abs: 0.9 10*3/uL (ref 0.7–4.0)
MCH: 27.6 pg (ref 26.0–34.0)
MCHC: 32.9 g/dL (ref 30.0–36.0)
MCV: 83.8 fL (ref 80.0–100.0)
MONO ABS: 0.6 10*3/uL (ref 0.1–1.0)
Monocytes Relative: 11 %
Neutro Abs: 2.9 10*3/uL (ref 1.7–7.7)
Neutrophils Relative %: 55 %
Platelets: 328 10*3/uL (ref 150–400)
RBC: 4.93 MIL/uL (ref 4.22–5.81)
RDW: 13 % (ref 11.5–15.5)
WBC: 5.2 10*3/uL (ref 4.0–10.5)
nRBC: 0 % (ref 0.0–0.2)

## 2018-08-14 LAB — GLUCOSE 6 PHOSPHATE DEHYDROGENASE
G6PDH: 8.1 U/g{Hb} (ref 4.6–13.5)
HEMOGLOBIN: 12.6 g/dL — AB (ref 13.0–17.7)

## 2018-08-14 MED ORDER — IOHEXOL 300 MG/ML  SOLN
75.0000 mL | Freq: Once | INTRAMUSCULAR | Status: AC | PRN
  Administered 2018-08-14: 75 mL via INTRAVENOUS

## 2018-08-14 NOTE — Progress Notes (Signed)
PROGRESS NOTE    Thomas Park  HGD:[id number] DOB: 1991-07-12 DOA: 08/10/2018 PCP: Patient, No Pcp Per   Brief Narrative: Patient is a 27 year old male recently diagnosed with HIV presented with flulike symptoms and disseminated rash.  He was also very confused and disoriented on presentation.  MRI of the brain showed numerous enhancing lesions greatest at the basal ganglia on the left side consistent with toxoplasmosis, mild to moderate vasogenic edema with regional mass-effect.  ID consulted started on antibiotics.  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  Started on antibiotics.  Currently on clindamycin and atovaquone. Patient is still confused and disoriented. Sent to mail to cardiology for TEE to rule out endocarditis. Multiple lab panels have been sent and we will follow-up on that. Echocardiogram showed normal left ventricular systolic function, filamentous structure in the right atrium possibly anatomic variant.  Blood cultures have been negative.  HIV: ID following.  Waiting on starting on antiretroviral. He has history of substance abuse.  No clinical signs of active withdrawal. Has low CD4 count and high viral load.  Chest x-ray did not show any pneumonia.  Hepatitis panel negative.  Gold TB test negative, RPR negative.  Peripheral visual loss: Right eye deviated towards lateral side.  Ophthalmology already saw the patient.  This is secondary to intracranial lesions.  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  Start on fluid restriction.  Sodium is 130.  Debility/deconditioning/disposition: PT evaluated  the patient and recommended CIR.  CIR consulted  Nutrition Problem:  Increased nutrient needs Etiology: chronic illness      DVT prophylaxis: SCD Code Status: Full Family Communication: Sister at the bedside, used interpreter system Disposition Plan: Undetermined at this point.   Consultants: ID  Procedures: None  Antimicrobials: Clindamycin, atovaquone  Subjective: Patient seen and examined the bedside this morning.  Remains hemodynamically stable.  Still confused and disoriented and cannot give appropriate information.  Used interpreter and communicated with the sister , discussed about further planning and disposition.  Objective: Vitals:   08/13/18 2050 08/14/18 0004 08/14/18 0400 08/14/18 0744  BP: (!) 111/59 114/69 108/61 100/65  Pulse: 69 70 73 75  Resp: 15 17 20 17   Temp:  99 F (37.2 C) 97.7 F (36.5 C) 98.3 F (36.8 C)  TempSrc:  Oral Oral   SpO2: 100% 99% 100% 99%  Weight:        Intake/Output Summary (Last 24 hours) at 08/14/2018 1033 Last data filed at 08/13/2018 1518 Gross per 24 hour  Intake 550 ml  Output -  Net 550 ml   Filed Weights   08/10/18 1700  Weight: 61.2 kg    Examination:  General exam: Appears calm and comfortable ,Not in distress,average built HEENT: Right eye deviated on the lateral side,Oral mucosa moist, Ear/Nose normal on gross exam Respiratory system: Bilateral equal air entry, normal vesicular breath sounds, no wheezes or crackles  Cardiovascular system: S1 & S2 heard, RRR. No JVD, murmurs, rubs, gallops or clicks. No pedal edema. Gastrointestinal system: Abdomen is nondistended, soft and nontender. No organomegaly or masses felt. Normal bowel sounds heard. Central nervous system: Alert but not oriented.  Extremities: No edema, no clubbing ,no cyanosis, distal peripheral pulses palpable. Skin: No  rashes, lesions or ulcers,no icterus ,no pallor .     Data Reviewed: I have personally reviewed following labs and imaging studies  CBC: Recent Labs  Lab 08/10/18 1221 08/10/18 2148  08/11/18 0339 08/11/18 1240 08/12/18 0617 08/13/18 0505 08/14/18 0607  WBC 3.8* 3.4* 3.9*  --  4.2 4.1 5.2  NEUTROABS 2.0  --   --   --  2.3 1.8 2.9  HGB 13.1 13.1 12.4* 12.6* 12.9* 12.8* 13.6  HCT 40.8 40.9 38.4*  --  39.1 39.2 41.3  MCV 85.4 83.5 83.3  --  83.0 84.7 83.8  PLT 333 348 334  --  347 315 951   Basic Metabolic Panel: Recent Labs  Lab 08/10/18 1221 08/10/18 2148 08/11/18 0339 08/12/18 0617 08/13/18 0505 08/14/18 0607  NA 129*  --  129* 128* 128* 130*  K 4.1  --  3.9 4.0 4.4 4.6  CL 99  --  101 100 99 102  CO2 22  --  22 21* 18* 17*  GLUCOSE 95  --  103* 93 92 87  BUN 11  --  7 7 9 12   CREATININE 0.81 0.77 0.87 0.96 1.03 0.91  CALCIUM 8.4*  --  8.3* 8.5* 8.5* 8.7*   GFR: CrCl cannot be calculated (Unknown ideal weight.). Liver Function Tests: Recent Labs  Lab 08/10/18 1221  AST 35  ALT 21  ALKPHOS 46  BILITOT 0.5  PROT 8.0  ALBUMIN 3.3*   No results for input(s): LIPASE, AMYLASE in the last 168 hours. Recent Labs  Lab 08/10/18 1221  AMMONIA 17   Coagulation Profile: No results for input(s): INR, PROTIME in the last 168 hours. Cardiac Enzymes: No results for input(s): CKTOTAL, CKMB, CKMBINDEX, TROPONINI in the last 168 hours. BNP (last 3 results) No results for input(s): PROBNP in the last 8760 hours. HbA1C: No results for input(s): HGBA1C in the last 72 hours. CBG: No results for input(s): GLUCAP in the last 168 hours. Lipid Profile: No results for input(s): CHOL, HDL, LDLCALC, TRIG, CHOLHDL, LDLDIRECT in the last 72 hours. Thyroid Function Tests: No results for input(s): TSH, T4TOTAL, FREET4, T3FREE, THYROIDAB in the last 72 hours. Anemia Panel: No results for input(s): VITAMINB12, FOLATE, FERRITIN, TIBC, IRON, RETICCTPCT in the last 72 hours. Sepsis Labs: No results for input(s): PROCALCITON, LATICACIDVEN in the last 168 hours.  Recent Results (from the past 240 hour(s))  Anaerobic culture     Status: None (Preliminary result)    Collection Time: 08/11/18 11:20 AM  Result Value Ref Range Status   Specimen Description CSF  Final   Special Requests NONE  Final   Gram Stain PENDING  Incomplete   Culture   Final    NO GROWTH 2 DAYS Performed at [facility], [address] [place], [place] [address]    Report Status PENDING  Incomplete  CSF culture     Status: None (Preliminary result)   Collection Time: 08/11/18 11:20 AM  Result Value Ref Range Status   Specimen Description CSF  Final   Special Requests NONE  Final   Gram Stain NO WBC SEEN NO ORGANISMS SEEN   Final   Culture   Final    NO GROWTH 2 DAYS Performed at [facility], [place] [address] [place], [place] [address]    Report Status PENDING  Incomplete  Toxoplasma Gondii, PCR     Status: Abnormal   Collection Time: 08/11/18 11:20 AM  Result Value Ref Range Status   Toxoplasma Gondii,  PCR Positive (A) Negative Final    Comment: (NOTE) Toxoplasma gondii DNA Detected. This test was developed and its performance characteristics determined by [employer]. It has not been cleared or approved by the U.S. Food and Drug Administration. The FDA has determined that such clearance or approval is not necessary. This test is used for clinical purposes. It should not be regarded as investigational or research. Performed At: [facility] [place], [place] [id number] Rush Farmer MD OT:[id number]   Culture, blood (Routine X 2) w Reflex to ID Panel     Status: None (Preliminary result)   Collection Time: 08/12/18 11:12 AM  Result Value Ref Range Status   Specimen Description BLOOD RIGHT ANTECUBITAL  Final   Special Requests   Final    BOTTLES DRAWN AEROBIC AND ANAEROBIC Blood Culture adequate volume   Culture   Final    NO GROWTH 2 DAYS Performed at [facility], [address] [place], [place] [address]    Report Status PENDING  Incomplete  Culture, blood (Routine X 2) w Reflex to ID Panel     Status:  None (Preliminary result)   Collection Time: 08/12/18 11:19 AM  Result Value Ref Range Status   Specimen Description BLOOD LEFT ANTECUBITAL  Final   Special Requests   Final    BOTTLES DRAWN AEROBIC AND ANAEROBIC Blood Culture adequate volume   Culture   Final    NO GROWTH 2 DAYS Performed at [facility], [place] [address] [place], [place] [address]    Report Status PENDING  Incomplete         Radiology Studies: Dg Chest 2 View  Result Date: 08/12/2018 CLINICAL DATA:  Brain lesion.  History of HIV. EXAM: CHEST - 2 VIEW COMPARISON:  MRI 08/10/2018. FINDINGS: Mediastinum and hilar structures normal. Cardiomegaly with normal pulmonary vascularity. Mild right base subsegmental atelectasis/infiltrate. No pleural effusion or pneumothorax. No acute bony abnormality. IMPRESSION: Mild right base subsegmental atelectasis/infiltrate. Electronically Signed   By: Marcello Moores  Register   On: 08/12/2018 15:50        Scheduled Meds: . atovaquone  750 mg Oral BID WC  . feeding supplement  1 Container Oral TID BM  . multivitamins with iron  1 tablet Oral Daily  . thiamine  100 mg Oral Daily   Continuous Infusions: . clindamycin (CLEOCIN) IV 900 mg (08/14/18 0447)     LOS: 4 days    Time spent: 35 mins.More than 50% of that time was spent in counseling and/or coordination of care.      Shelly Coss, MD [facility] Pager [phone]  If 7PM-7AM, please contact night-coverage [email or web address] Password [facility] 08/14/2018, 10:33 AM

## 2018-08-14 NOTE — Progress Notes (Signed)
Subjective:  More somnolent  Antibiotics:  Anti-infectives (From admission, onward)   Start     Dose/Rate Route Frequency Ordered Stop   08/13/18 2000  atovaquone (MEPRON) 750 MG/5ML suspension 750 mg     750 mg Oral 2 times daily with meals 08/13/18 1154     08/13/18 1230  clindamycin (CLEOCIN) IVPB 900 mg     900 mg 100 mL/hr over 30 Minutes Intravenous Every 8 hours 08/13/18 1154     08/13/18 1230  atovaquone (MEPRON) 750 MG/5ML suspension 750 mg     750 mg Oral NOW 08/13/18 1154 08/13/18 1330   08/10/18 1830  sulfamethoxazole-trimethoprim (BACTRIM) 306.08 mg in dextrose 5 % 250 mL IVPB  Status:  Discontinued     10 mg/kg/day  61.2 kg 269.1 mL/hr over 60 Minutes Intravenous Every 12 hours 08/10/18 1802 08/13/18 1154      Medications: Scheduled Meds: . atovaquone  750 mg Oral BID WC  . feeding supplement  1 Container Oral TID BM  . multivitamins with iron  1 tablet Oral Daily  . thiamine  100 mg Oral Daily   Continuous Infusions: . clindamycin (CLEOCIN) IV 900 mg (08/14/18 1201)   PRN Meds:.acetaminophen **OR** acetaminophen, ondansetron **OR** ondansetron (ZOFRAN) IV    Objective: Weight change:   Intake/Output Summary (Last 24 hours) at 08/14/2018 1220 Last data filed at 08/13/2018 1518 Gross per 24 hour  Intake 550 ml  Output -  Net 550 ml   Blood pressure 113/69, pulse 81, temperature 98.3 F (36.8 C), resp. rate 20, weight 61.2 kg, SpO2 100 %. Temp:  [97.7 F (36.5 C)-99 F (37.2 C)] 98.3 F (36.8 C) (12/01 1216) Pulse Rate:  [69-82] 81 (12/01 1216) Resp:  [15-20] 20 (12/01 1216) BP: (80-114)/(59-69) 113/69 (12/01 1216) SpO2:  [99 %-100 %] 100 % (12/01 1216)  Physical Exam: General: somnolent, but arousable and able to follow commands HEENT: anicteric sclera, EOMI, injected conjunctiva CVS regular rate, normal  Chest: , no wheezing, no respiratory distress Abdomen: soft non-distended,  Extremities: no edema or deformity noted  bilaterally Skin: Rash on upper extremities Neuro no new focal deficits  CBC:    BMET Recent Labs    08/13/18 0505 08/14/18 0607  NA 128* 130*  K 4.4 4.6  CL 99 102  CO2 18* 17*  GLUCOSE 92 87  BUN 9 12  CREATININE 1.03 0.91  CALCIUM 8.5* 8.7*     Liver Panel  No results for input(s): PROT, ALBUMIN, AST, ALT, ALKPHOS, BILITOT, BILIDIR, IBILI in the last 72 hours.     Sedimentation Rate No results for input(s): ESRSEDRATE in the last 72 hours. C-Reactive Protein No results for input(s): CRP in the last 72 hours.  Micro Results: Recent Results (from the past 720 hour(s))  Anaerobic culture     Status: None (Preliminary result)   Collection Time: 08/11/18 11:20 AM  Result Value Ref Range Status   Specimen Description CSF  Final   Special Requests NONE  Final   Gram Stain PENDING  Incomplete   Culture   Final    NO GROWTH 2 DAYS NO ANAEROBES ISOLATED; CULTURE IN PROGRESS FOR 5 DAYS Performed at [facility], [address], [place],  [address]    Report Status PENDING  Incomplete  CSF culture     Status: None (Preliminary result)   Collection Time: 08/11/18 11:20 AM  Result Value Ref Range Status   Specimen Description CSF  Final  Special Requests NONE  Final   Gram Stain NO WBC SEEN NO ORGANISMS SEEN   Final   Culture   Final    NO GROWTH 2 DAYS Performed at [facility], [place] [address] [place], [place] [address]    Report Status PENDING  Incomplete  Toxoplasma Gondii, PCR     Status: Abnormal   Collection Time: 08/11/18 11:20 AM  Result Value Ref Range Status   Toxoplasma Gondii, PCR Positive (A) Negative Final    Comment: (NOTE) Toxoplasma gondii DNA Detected. This test was developed and its performance characteristics determined by [employer]. It has not been cleared or approved by the U.S. Food and Drug Administration. The FDA has determined that such clearance or approval is not necessary. This test is used for  clinical purposes. It should not be regarded as investigational or research. Performed At: [facility] [place], [place] [id number] Rush Farmer MD NL:[id number]   Culture, blood (Routine X 2) w Reflex to ID Panel     Status: None (Preliminary result)   Collection Time: 08/12/18 11:12 AM  Result Value Ref Range Status   Specimen Description BLOOD RIGHT ANTECUBITAL  Final   Special Requests   Final    BOTTLES DRAWN AEROBIC AND ANAEROBIC Blood Culture adequate volume   Culture   Final    NO GROWTH 2 DAYS Performed at [facility], [address] [place], [place] [address]    Report Status PENDING  Incomplete  Culture, blood (Routine X 2) w Reflex to ID Panel     Status: None (Preliminary result)   Collection Time: 08/12/18 11:19 AM  Result Value Ref Range Status   Specimen Description BLOOD LEFT ANTECUBITAL  Final   Special Requests   Final    BOTTLES DRAWN AEROBIC AND ANAEROBIC Blood Culture adequate volume   Culture   Final    NO GROWTH 2 DAYS Performed at [facility], [place] [address] [place], [place] [address]    Report Status PENDING  Incomplete    Studies/Results: Dg Chest 2 View  Result Date: 08/12/2018 CLINICAL DATA:  Brain lesion.  History of HIV. EXAM: CHEST - 2 VIEW COMPARISON:  MRI 08/10/2018. FINDINGS: Mediastinum and hilar structures normal. Cardiomegaly with normal pulmonary vascularity. Mild right base subsegmental atelectasis/infiltrate. No pleural effusion or pneumothorax. No acute bony abnormality. IMPRESSION: Mild right base subsegmental atelectasis/infiltrate. Electronically Signed   By: Marcello Moores  Register   On: 08/12/2018 15:50      Assessment/Plan:  INTERVAL HISTORY:   Toxoplasma PCR is POSITIVE from CSF  Active Problems:   Encephalitis   Hyponatremia   Substance abuse ([place])   Brain lesion   Confusion    Thomas Park is a 27 y.o. male with diagnosed HIV and AIDS resents with confusion  headaches and multiple CNS lesions concerning for possible toxoplasmosis versus other opportunistic infections.  He also has peripheral vision loss  #1 Toxoplasmosis of CNS with Encephalitis:  Continue current therapy with atovaquone and clindamycin until we can get pyramethamine  I will get CT with contrast today as well given increse in deviation of eyes and confusion  If patients confusion does not improve may need feeding tube ET but hopefully will not require this but if so I would recommend.   2.  Peripheral vision loss: See Dr. Benna Dunks note.   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   Dr. Johnnye Sima to take over tomorrow.     LOS: 4 days   Alcide Evener 08/14/2018, 12:20 PM

## 2018-08-14 NOTE — Progress Notes (Signed)
Pt. assesment completed using translation service with assistance of Ivar Drape [phone].

## 2018-08-15 LAB — BASIC METABOLIC PANEL
ANION GAP: 8 (ref 5–15)
BUN: 14 mg/dL (ref 6–20)
CO2: 22 mmol/L (ref 22–32)
Calcium: 8.7 mg/dL — ABNORMAL LOW (ref 8.9–10.3)
Chloride: 101 mmol/L (ref 98–111)
Creatinine, Ser: 0.9 mg/dL (ref 0.61–1.24)
GFR calc Af Amer: >60 mL/min (ref >60)
GFR calc non Af Amer: >60 mL/min (ref >60)
GLUCOSE: 103 mg/dL — AB (ref 70–99)
Potassium: 4.1 mmol/L (ref 3.5–5.1)
Sodium: 131 mmol/L — ABNORMAL LOW (ref 135–145)

## 2018-08-15 LAB — QUANTIFERON-TB GOLD PLUS (RQFGPL)
QuantiFERON Mitogen Value: 4.46 IU/mL
QuantiFERON Nil Value: 0.31 IU/mL
QuantiFERON TB1 Ag Value: 0.38 IU/mL
QuantiFERON TB2 Ag Value: 0.29 IU/mL

## 2018-08-15 LAB — ANAEROBIC CULTURE

## 2018-08-15 LAB — QUANTIFERON-TB GOLD PLUS: QuantiFERON-TB Gold Plus: NEGATIVE

## 2018-08-15 LAB — VDRL, CSF: VDRL Quant, CSF: NONREACTIVE

## 2018-08-15 NOTE — Plan of Care (Signed)
  Problem: Education: Goal: Knowledge of General Education information will improve Description Including pain rating scale, medication(s)/side effects and non-pharmacologic comfort measures 08/15/2018 1747 by Jilda Roche, RN Outcome: Progressing 08/15/2018 1724 by Jilda Roche, RN Outcome: Progressing   Problem: Health Behavior/Discharge Planning: Goal: Ability to manage health-related needs will improve 08/15/2018 1747 by Jilda Roche, RN Outcome: Progressing 08/15/2018 1724 by Jilda Roche, RN Outcome: Progressing   Problem: Clinical Measurements: Goal: Ability to maintain clinical measurements within normal limits will improve 08/15/2018 1747 by Jilda Roche, RN Outcome: Progressing 08/15/2018 1724 by Jilda Roche, RN Outcome: Progressing Goal: Will remain free from infection 08/15/2018 1747 by Jilda Roche, RN Outcome: Progressing 08/15/2018 1724 by Jilda Roche, RN Outcome: Progressing Goal: Diagnostic test results will improve 08/15/2018 1747 by Jilda Roche, RN Outcome: Progressing 08/15/2018 1724 by Jilda Roche, RN Outcome: Progressing Goal: Respiratory complications will improve 08/15/2018 1747 by Jilda Roche, RN Outcome: Progressing 08/15/2018 1724 by Jilda Roche, RN Outcome: Progressing Goal: Cardiovascular complication will be avoided 08/15/2018 1747 by Jilda Roche, RN Outcome: Progressing 08/15/2018 1724 by Jilda Roche, RN Outcome: Progressing   Problem: Activity: Goal: Risk for activity intolerance will decrease 08/15/2018 1747 by Jilda Roche, RN Outcome: Progressing 08/15/2018 1724 by Jilda Roche, RN Outcome: Progressing   Problem: Nutrition: Goal: Adequate nutrition will be maintained 08/15/2018 1747 by Jilda Roche, RN Outcome: Progressing 08/15/2018 1724  by Jilda Roche, RN Outcome: Progressing   Problem: Coping: Goal: Level of anxiety will decrease 08/15/2018 1747 by Jilda Roche, RN Outcome: Progressing 08/15/2018 1724 by Jilda Roche, RN Outcome: Progressing   Problem: Elimination: Goal: Will not experience complications related to bowel motility 08/15/2018 1747 by Jilda Roche, RN Outcome: Progressing 08/15/2018 1724 by Jilda Roche, RN Outcome: Progressing Goal: Will not experience complications related to urinary retention 08/15/2018 1747 by Jilda Roche, RN Outcome: Progressing 08/15/2018 1724 by Jilda Roche, RN Outcome: Progressing   Problem: Pain Managment: Goal: General experience of comfort will improve 08/15/2018 1747 by Jilda Roche, RN Outcome: Progressing 08/15/2018 1724 by Jilda Roche, RN Outcome: Progressing   Problem: Safety: Goal: Ability to remain free from injury will improve 08/15/2018 1747 by Jilda Roche, RN Outcome: Progressing 08/15/2018 1724 by Jilda Roche, RN Outcome: Progressing   Problem: Skin Integrity: Goal: Risk for impaired skin integrity will decrease 08/15/2018 1747 by Jilda Roche, RN Outcome: Progressing 08/15/2018 1724 by Jilda Roche, RN Outcome: Progressing   Problem: Progression Outcomes Goal: Knowledge of disease or condition and therapeutic regimen will improve 08/15/2018 1747 by Jilda Roche, RN Outcome: Progressing 08/15/2018 1724 by Jilda Roche, RN Outcome: Progressing

## 2018-08-15 NOTE — Consult Note (Signed)
Physical Medicine and Rehabilitation Consult Reason for Consult:  Decreased functional mobility Referring Physician:   [facility]   HPI: Thomas Park de Thomas Park is a 27 y.o.right handed non-English-speaking male  with history of polysubstance abuse recently diagnosed HIV. 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 Presented 08/10/2018 with altered mental status as well as unintentional weight loss over the past 3 months. MRI of the brain reviewed with numerous masses.  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 Mild to moderate vasogenic edema with regional mass effect greatest at the left basal ganglia. CD4 T-cell less than 10. 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 Therapy evaluation completed with recommendations of physical medicine rehabilitation consult.  Review of Systems  Unable to perform ROS: Language   Past Medical History:  Diagnosis Date  . Anemia    [name] 08/10/2018  . HIV (human immunodeficiency virus infection) ([place])    dx'd 07/2018  . 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 NO PAST SURGERIES     History reviewed. No pertinent family history.,  Unable to obtain from patient Social History:  reports that he has never smoked. He has never used smokeless tobacco. He reports that he drinks about 6.0 standard drinks of alcohol per week. He reports that he has current or past drug history. Allergies: No Known Allergies Medications Prior to Admission  Medication Sig Dispense Refill  . fluconazole (DIFLUCAN) 200 MG tablet Take 200 mg by mouth daily.  0  . ibuprofen (ADVIL,MOTRIN) 200 MG tablet Take 400 mg by mouth  every 6 (six) hours as needed for fever or headache.    [name] Kitchen OVER THE COUNTER MEDICATION Apply 2 drops to eye daily as needed (for red eyes, itchy).      Home: Home Living Family/patient expects to be discharged to:: Private residence Living Arrangements: Other relatives Available Help at Discharge: Family(lives with sister) Type of Home: House Home Access: Level entry Home Layout: One level Home Equipment: None  Functional History: Prior Function Level of Independence: Independent Comments: was working every day prior to this admission Functional Status:  Mobility: Bed Mobility Overal bed mobility: Needs Assistance Bed Mobility: Supine to Sit, Sit to Supine Supine to sit: Min assist Sit to supine: Min assist General bed mobility comments: min assist for safety, patient moving impulsively  Transfers Overall transfer level: Needs assistance Equipment used: 1 person hand held assist Transfers: Sit to/from Stand Sit to Stand: Min assist General transfer comment: min assist for stability once powered up Ambulation/Gait Ambulation/Gait assistance: Max assist Gait Distance (Feet): 4 Feet Assistive device: 1 person hand held assist Gait Pattern/deviations: Ataxic, Step-to pattern, Decreased stride length, Staggering right, Staggering left General Gait Details: patient unable to coordinate or control movement in any direction, max assist with noted forward propulsion Gait velocity: decreased    ADL:    Cognition: Cognition Overall Cognitive Status: Difficult to assess Orientation Level: Oriented to person Cognition Arousal/Alertness: Awake/alert Behavior During Therapy: Restless Overall Cognitive Status: Difficult to assess Area of Impairment: Problem solving, Safety/judgement, Awareness, Attention, Following commands Current Attention Level: Sustained Following Commands: Follows one step commands inconsistently, Follows one step commands with increased  time Safety/Judgement: Decreased awareness of safety, Decreased awareness of  deficits Awareness: Intellectual Problem Solving: Slow processing, Difficulty sequencing, Requires verbal cues, Requires tactile cues, Decreased initiation General Comments: difficult to determine true cognitive impairment vs language barrier despite interpreter use Difficult to assess due to: Non-English speaking  Blood pressure 109/70, pulse 75, temperature 98.2 F (36.8 C), temperature source Oral, resp. rate 18, height 5\' 8"  (1.727 m), weight 61.2 kg, SpO2 99 %. Physical Exam  Vitals reviewed. Constitutional: He appears well-developed and well-nourished.  HENT:  Head: Normocephalic and atraumatic.  Eyes: Right eye exhibits no discharge. Left eye exhibits no discharge.  Disconjugate gaze Injected sclera  Neck: Normal range of motion. Neck supple. No thyromegaly present.  Cardiovascular: Normal rate and regular rhythm.  Respiratory: Effort normal and breath sounds normal. No respiratory distress.  GI: Soft. Bowel sounds are normal. He exhibits distension.  Musculoskeletal:  No edema or tenderness in extremities  Neurological: He is alert.  Patient is alert and a bit anxious.  Exam limited due to language barrier, however there does appear to be some delays in processing Motor: Does not follow any commands, but spontaneously moving all extremities Right facial weakness  Skin: Skin is warm and dry.  Psychiatric:  Unable to assess due to language, but appears to have some confusion and delays in processing    Results for orders placed or performed during the hospital encounter of 08/10/18 (from the past 24 hour(s))  Basic metabolic panel     Status: Abnormal   Collection Time: 08/15/18  4:11 AM  Result Value Ref Range   Sodium 131 (L) 135 - 145 mmol/L   Potassium 4.1 3.5 - 5.1 mmol/L   Chloride 101 98 - 111 mmol/L   CO2 22 22 - 32 mmol/L   Glucose, Bld 103 (H) 70 - 99 mg/dL   BUN 14 6 - 20 mg/dL    Creatinine, Ser 0.90 0.61 - 1.24 mg/dL   Calcium 8.7 (L) 8.9 - 10.3 mg/dL   GFR calc non Af Amer >60 >60 mL/min   GFR calc Af Amer >60 >60 mL/min   Anion gap 8 5 - 15   Ct Head W & Wo Contrast  Result Date: 08/14/2018 CLINICAL DATA:  In cephalopathy. HIV. EXAM: CT HEAD WITHOUT AND WITH CONTRAST TECHNIQUE: Contiguous axial images were obtained from the base of the skull through the vertex without and with intravenous contrast CONTRAST:  39mL OMNIPAQUE IOHEXOL 300 MG/ML  SOLN COMPARISON:  Brain MRI 08/10/2018 FINDINGS: Brain: Extensive edema at the level of the basal ganglia on the left is similar to the prior MRI. There is an 11 mm focus of hemorrhage in the left lentiform nucleus with intrinsic T1 hyperintensity and faint susceptibility artifact present on the prior MRI suggesting that the hemorrhage is not new. Numerous enhancing lesions are again seen throughout both cerebral and cerebellar hemispheres without evidence of substantial interval progression compared to the MRI. The largest lesion is again noted in the left basal ganglia measuring 2.3 cm. Mild edema associated with a right temporoparietal lesion is unchanged. A 5 mm hyperdense lesion in the posterior left temporal lobe may also be hemorrhagic. There is no evidence of acute large territory infarct. Effacement of the left lateral ventricle due to the vasogenic edema is unchanged with minimal local rightward midline shift. There is no extra-axial fluid collection. Cerebral volume is normal. Vascular: Grossly patent major dural venous sinuses. Skull: No fracture or focal osseous lesion. Sinuses/Orbits: Visualized paranasal sinuses and mastoid air cells are clear. Unremarkable orbits. Other: None. IMPRESSION: 1.  Numerous enhancing brain lesions without evidence of significant progression since the recent MRI consistent with the clinical diagnosis of toxoplasmosis. 2. Unchanged extensive edema in the left basal ganglia with a 11 mm hemorrhagic  lesion in the left lentiform nucleus. Electronically Signed   By: Logan Bores M.D.   On: 08/14/2018 16:06    Assessment/Plan: Diagnosis: toxoplasmosis.  Labs and images (see above) independently reviewed.  Records reviewed and summated above.  1. Does the need for close, 24 hr/day medical supervision in concert with the patient's rehab needs make it unreasonable for this patient to be served in a less intensive setting? Yes  2. Co-Morbidities requiring supervision/potential complications: polysubstance abuse (counsel), recently diagnosed HIV (recs per ID), hyponatremia (continue to monitor, treat if necessary) 3. Due to safety, disease management, medication administration and patient education, does the patient require 24 hr/day rehab nursing? Yes 4. Does the patient require coordinated care of a physician, rehab nurse, PT (1-2 hrs/day, 5 days/week), OT (1-2 hrs/day, 5 days/week) and SLP (1-2 hrs/day, 5 days/week) to address physical and functional deficits in the context of the above medical diagnosis(es)? Yes Addressing deficits in the following areas: balance, endurance, locomotion, strength, transferring, bathing, dressing, toileting, cognition, speech, language, swallowing and psychosocial support 5. Can the patient actively participate in an intensive therapy program of at least 3 hrs of therapy per day at least 5 days per week? Potentially 6. The potential for patient to make measurable gains while on inpatient rehab is excellent 7. Anticipated functional outcomes upon discharge from inpatient rehab are supervision  with PT, supervision with OT, supervision with SLP. 8. Estimated rehab length of stay to reach the above functional goals is: 16-19 days. 9. Anticipated D/C setting: Home 10. Anticipated post D/C treatments: HH therapy and Home excercise program 11. Overall Rehab/Functional Prognosis: excellent and good  RECOMMENDATIONS: This patient's condition is appropriate for continued  rehabilitative care in the following setting: Will need to inquire about caregiver support and discharge, if available recommend CIR. Patient has agreed to participate in recommended program. Potentially Note that insurance prior authorization may be required for reimbursement for recommended care.  Comment: Rehab Admissions Coordinator to follow up.   I have personally performed a face to face diagnostic evaluation, including, but not limited to relevant history and physical exam findings, of this patient and developed relevant assessment and plan.  Additionally, I have reviewed and concur with the physician assistant's documentation above.   Delice Lesch, MD, ABPMR Lavon Paganini Angiulli, PA-C 08/15/2018

## 2018-08-15 NOTE — Progress Notes (Signed)
Physical Therapy Treatment Patient Details Name: Thomas Park MRN: [id number] DOB: 11-01-1990 Today's Date: 08/15/2018    History of Present Illness Ronnell Clinger de Gwendalyn Ege is a 27 y.o.right handed non-English-speaking male  with history of polysubstance abuse recently diagnosed HIV. 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 Presented 08/10/2018 with altered mental status as well as unintentional weight loss over the past 3 months. MRI of the brain reviewed with numerous masses.     PT Comments    Pt performed functional mobility with +2 assistance for safety.  Virtual interpreter used for tx Cori Razor [phone]).  Sister present.  Pt slow to respond and limited in responses.  Pt continues to benefit from CIR therapies.     Follow Up Recommendations  CIR     Equipment Recommendations  (TBD)    Recommendations for Other Services Rehab consult     Precautions / Restrictions Precautions Precautions: Fall Restrictions Weight Bearing Restrictions: No    Mobility  Bed Mobility Overal bed mobility: Needs Assistance Bed Mobility: Supine to Sit;Sit to Supine     Supine to sit: Min assist Sit to supine: Min assist   General bed mobility comments: min assist for safety, patient moving impulsively   Transfers Overall transfer level: Needs assistance Equipment used: 1 person hand held assist Transfers: Sit to/from Stand Sit to Stand: Min assist         General transfer comment: impulsive wtih mobility, cues for hand placement to push from seated surface.    Ambulation/Gait Ambulation/Gait assistance: Max assist;+2 safety/equipment Gait Distance (Feet): 40 Feet Assistive device: 1 person hand held assist Gait Pattern/deviations: Step-to pattern;Decreased stride length;Staggering right;Staggering left;Narrow base of support Gait velocity: decreased   General  Gait Details: patient unable to coordinate or control movement in any direction, max assist with ambulation.     Stairs             Wheelchair Mobility    Modified Rankin (Stroke Patients Only)       Balance Overall balance assessment: Needs assistance   Sitting balance-Leahy Scale: Fair       Standing balance-Leahy Scale: Poor Standing balance comment: reliant on external support                            Cognition Arousal/Alertness: Awake/alert Behavior During Therapy: Restless;Impulsive Overall Cognitive Status: Impaired/Different from baseline Area of Impairment: Problem solving;Safety/judgement;Awareness;Attention;Following commands                   Current Attention Level: Focused   Following Commands: Follows one step commands inconsistently;Follows one step commands with increased time Safety/Judgement: Decreased awareness of safety;Decreased awareness of deficits Awareness: Intellectual Problem Solving: Slow processing;Difficulty sequencing;Requires verbal cues;Requires tactile cues;Decreased initiation General Comments: difficult to determine true cognitive impairment vs language barrier despite interpreter use      Exercises      General Comments        Pertinent Vitals/Pain Pain Assessment: No/denies pain Pain Location: unable to determine    Home Living Family/patient expects to be discharged to:: Private residence Living Arrangements: Other relatives Available Help at Discharge: Family(lives with sister) Type of Home: House Home Access: Level entry   Home Layout: One level Home Equipment: None      Prior Function Level of Independence: Independent      Comments: 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   PT Goals (current goals can now be found in the care plan section) Acute Rehab PT Goals Patient Stated Goal: to get better - per sister Potential to Achieve Goals: Good Progress  towards PT goals: Progressing toward goals    Frequency    Min 3X/week      PT Plan Current plan remains appropriate    Co-evaluation PT/OT/SLP Co-Evaluation/Treatment: Yes Reason for Co-Treatment: Complexity of the patient's impairments (multi-system involvement) PT goals addressed during session: Mobility/safety with mobility OT goals addressed during session: ADL's and self-care      AM-PAC PT "6 Clicks" Mobility   Outcome Measure  Help needed turning from your back to your side while in a flat bed without using bedrails?: A Little Help needed moving from lying on your back to sitting on the side of a flat bed without using bedrails?: A Little Help needed moving to and from a bed to a chair (including a wheelchair)?: A Lot Help needed standing up from a chair using your arms (e.g., wheelchair or bedside chair)?: A Lot Help needed to walk in hospital room?: A Lot Help needed climbing 3-5 steps with a railing? : A Lot 6 Click Score: 14    End of Session Equipment Utilized During Treatment: Gait belt Activity Tolerance: Patient limited by fatigue Patient left: in bed;with call bell/phone within reach;with bed alarm set;with family/visitor present Nurse Communication: Mobility status PT Visit Diagnosis: Unsteadiness on feet (R26.81);Other symptoms and signs involving the nervous system (P53.005)     Time: 1102-1117 PT Time Calculation (min) (ACUTE ONLY): 25 min  Charges:  $Therapeutic Activity: 8-22 mins                     Governor Rooks, PTA Acute Rehabilitation Services Pager [phone] Office [phone]     Ramona Ruark Eli Hose 08/15/2018, 5:59 PM

## 2018-08-15 NOTE — Plan of Care (Signed)
  Problem: Education: Goal: Knowledge of General Education information will improve Description Including pain rating scale, medication(s)/side effects and non-pharmacologic comfort measures Outcome: Progressing   Problem: Health Behavior/Discharge Planning: Goal: Ability to manage health-related needs will improve Outcome: Progressing   Problem: Clinical Measurements: Goal: Ability to maintain clinical measurements within normal limits will improve Outcome: Progressing Goal: Will remain free from infection Outcome: Progressing Goal: Diagnostic test results will improve Outcome: Progressing Goal: Respiratory complications will improve Outcome: Progressing Goal: Cardiovascular complication will be avoided Outcome: Progressing   Problem: Activity: Goal: Risk for activity intolerance will decrease Outcome: Progressing   Problem: Nutrition: Goal: Adequate nutrition will be maintained Outcome: Progressing   Problem: Coping: Goal: Level of anxiety will decrease Outcome: Progressing   Problem: Elimination: Goal: Will not experience complications related to bowel motility Outcome: Progressing Goal: Will not experience complications related to urinary retention Outcome: Progressing   Problem: Pain Managment: Goal: General experience of comfort will improve Outcome: Progressing   Problem: Safety: Goal: Ability to remain free from injury will improve Outcome: Progressing   Problem: Skin Integrity: Goal: Risk for impaired skin integrity will decrease Outcome: Progressing   Problem: Progression Outcomes Goal: Knowledge of disease or condition and therapeutic regimen will improve Outcome: Progressing

## 2018-08-15 NOTE — Progress Notes (Signed)
Occupational Therapy Evaluation Patient Details Name: Thomas Park MRN: [id number] DOB: December 14, 1990 Today's Date: 08/15/2018    History of Present Illness Thomas Park de Gwendalyn Ege is a 27 y.o.right handed non-English-speaking male  with history of polysubstance abuse recently diagnosed HIV. 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 Presented 08/10/2018 with altered mental status as well as unintentional weight loss over the past 3 months. MRI of the brain reviewed with numerous masses.    Clinical Impression   PTA, pt lived with his sister and was independent with ADL and mobility. [employer] interpreter used during session. Pt with significant change in functional status requiring mod A with mobility and ADL. Pt with significant visual and cognitive deficits as noted below. Given pt's PLOF, feel he is an excellent CIR candidate. Discussed recommendation with pt's sister and she agrees to need for rehab prior to DC home. Will continue to follow acutely.     Follow Up Recommendations  CIR    Equipment Recommendations  3 in 1 bedside commode;Tub/shower bench    Recommendations for Other Services Rehab consult     Precautions / Restrictions Precautions Precautions: Fall Restrictions Weight Bearing Restrictions: No      Mobility Bed Mobility Overal bed mobility: Needs Assistance Bed Mobility: Supine to Sit;Sit to Supine     Supine to sit: Min assist Sit to supine: Min assist   General bed mobility comments: min assist for safety, patient moving impulsively   Transfers Overall transfer level: Needs assistance Equipment used: 1 person hand held assist Transfers: Sit to/from Stand Sit to Stand: Min assist         General transfer comment: impulsive wtih mobility    Balance Overall balance assessment: Needs assistance   Sitting balance-Leahy Scale: Fair       Standing balance-Leahy Scale: Poor Standing balance comment: reliant on external support                           ADL either performed or assessed with clinical judgement   ADL Overall ADL's : Needs assistance/impaired     Grooming: Moderate assistance Grooming Details (indicate cue type and reason): difficulty sequencing task; perseverating; attmepitng to put toothpaste on wrong side of toothbrush Upper Body Bathing: Moderate assistance;Sitting   Lower Body Bathing: Moderate assistance;Sit to/from stand   Upper Body Dressing : Moderate assistance;Sitting   Lower Body Dressing: Maximal assistance;Sit to/from stand   Toilet Transfer: Moderate assistance;Ambulation   Toileting- Clothing Manipulation and Hygiene: Moderate assistance;Sit to/from stand       Functional mobility during ADLs: Moderate assistance;+2 for safety/equipment       Vision   Vision Assessment?: Vision impaired- to be further tested in functional context Additional Comments: L gaze; slow beating horizontal nystagmus; poor visual attention; difficulty sustaining gaze     Perception Perception Comments: poor spatial awareness   Praxis Praxis Praxis tested?: Deficits Deficits: Perseveration;Organization Praxis-Other Comments: apparent sensory motor deficits    Pertinent Vitals/Pain Pain Assessment: No/denies pain     Hand Dominance Right   Extremity/Trunk Assessment Upper Extremity Assessment Upper Extremity Assessment: Generalized weakness;RUE deficits/detail;LUE deficits/detail RUE Deficits / Details: decreased coordination; using functionally but dropping toothbrush LUE Deficits / Details: using funcitonally but difficulty coordinating movement together   Lower Extremity Assessment Lower Extremity Assessment: Defer to PT evaluation   Cervical / Trunk Assessment Cervical /  Trunk Assessment: Normal   Communication Communication Communication: Interpreter utilized(Thomas Park  [id number])   Cognition Arousal/Alertness: Awake/alert Behavior During Therapy: Restless;Impulsive Overall Cognitive Status: Impaired/Different from baseline Area of Impairment: Problem solving;Safety/judgement;Awareness;Attention;Following commands                   Current Attention Level: Focused   Following Commands: Follows one step commands inconsistently;Follows one step commands with increased time Safety/Judgement: Decreased awareness of safety;Decreased awareness of deficits Awareness: Intellectual Problem Solving: Slow processing;Difficulty sequencing;Requires verbal cues;Requires tactile cues;Decreased initiation     General Comments       Exercises     Shoulder Instructions      Home Living Family/patient expects to be discharged to:: Private residence Living Arrangements: Other relatives Available Help at Discharge: Family(lives with sister) Type of Home: House Home Access: Level entry     Home Layout: One level     Bathroom Shower/Tub: Teacher, early years/pre: Standard Bathroom Accessibility: No   Home Equipment: None          Prior Functioning/Environment Level of Independence: Independent        Comments: 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        OT Problem List: Decreased strength;Decreased activity tolerance;Impaired balance (sitting and/or standing);Impaired vision/perception;Decreased coordination;Decreased cognition;Decreased safety awareness;Decreased knowledge of use of DME or AE;Impaired UE functional use      OT Treatment/Interventions: Self-care/ADL training;Neuromuscular education;DME and/or AE instruction;Therapeutic activities;Cognitive remediation/compensation;Visual/perceptual remediation/compensation;Patient/family education;Balance training    OT Goals(Current goals can be found in the care plan section) Acute Rehab OT Goals Patient Stated Goal: to get better - per sister OT  Goal Formulation: With patient/family Time For Goal Achievement: 08/29/18 Potential to Achieve Goals: Good  OT Frequency: Min 3X/week   Barriers to D/C:            Co-evaluation PT/OT/SLP Co-Evaluation/Treatment: Yes Reason for Co-Treatment: Complexity of the patient's impairments (multi-system involvement);For patient/therapist safety          AM-PAC OT "6 Clicks" Daily Activity     Outcome Measure Help from another person eating meals?: A Lot Help from another person taking care of personal grooming?: A Lot Help from another person toileting, which includes using toliet, bedpan, or urinal?: A Lot Help from another person bathing (including washing, rinsing, drying)?: A Lot Help from another person to put on and taking off regular upper body clothing?: A Lot Help from another person to put on and taking off regular lower body clothing?: A Lot 6 Click Score: 12   End of Session Equipment Utilized During Treatment: Gait belt Nurse Communication: Mobility status  Activity Tolerance: Patient tolerated treatment well Patient left: in chair;with call bell/phone within reach;with family/visitor present(seat belt alarm )  OT Visit Diagnosis: Other abnormalities of gait and mobility (R26.89);Muscle weakness (generalized) (M62.81);Low vision, both eyes (H54.2);Apraxia (R48.2);Other symptoms and signs involving cognitive function                Time: 3785-8850 OT Time Calculation (min): 30 min Charges:  OT General Charges $OT Visit: 1 Visit OT Evaluation $OT Eval Moderate Complexity: [place], OT/L   Acute OT Clinical Specialist Acute Rehabilitation Services Pager [phone] Office [phone]   [facility] 08/15/2018, 5:32 PM

## 2018-08-15 NOTE — Progress Notes (Signed)
Inpatient Rehabilitation Admissions Coordinator  I have requested OT and SLP evals to assist with planning dispo needs and abilities. I will follow up tomorrow.  Danne Baxter, RN, MSN Rehab Admissions Coordinator [phone] 08/15/2018 3:21 PM

## 2018-08-15 NOTE — Progress Notes (Signed)
PROGRESS NOTE    Thomas Park  TIW:[id number] DOB: 1991/08/04 DOA: 08/10/2018 PCP: Patient, No Pcp Per   Brief Narrative: Patient is a 27 year old male recently diagnosed with HIV presented with flulike symptoms and disseminated rash.  He was also very confused and disoriented on presentation.  MRI of the brain showed numerous enhancing lesions greatest at the basal ganglia on the left side consistent with toxoplasmosis, mild to moderate vasogenic edema with regional mass-effect.  ID consulted, started on antibiotics.  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  Started on antibiotics.  Currently on clindamycin and atovaquone. Patient is still confused and disoriented.He is oriented to person only.  CT head done yesterday to evaluate for follow-up and  ongoing encephalopathy.  It did not show  progression of the lesions as seen in the MRI. Echocardiogram showed normal left ventricular systolic function, filamentous structure in the right atrium possibly anatomic variant.  Blood cultures have been negative.  HIV: ID following.  Waiting on starting on antiretroviral. He has history of substance abuse.  No clinical signs of active withdrawal. Has low CD4 count and high viral load.  Chest x-ray did not show any pneumonia.  Hepatitis panel negative.  Gold TB test negative, RPR negative.  Peripheral visual loss: Right eye deviated towards lateral side.  Ophthalmology already saw the patient.  This is secondary to intracranial lesions.  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  Start on fluid restriction.  Sodium is 131.  Debility/deconditioning/disposition: PT evaluated  the patient and recommended CIR.  CIR  consulted  Nutrition Problem: Increased nutrient needs Etiology: chronic illness      DVT prophylaxis: SCD Code Status: Full Family Communication: Family members present  at the bedside Disposition Plan: Undetermined at this point.   Consultants: ID  Procedures: None  Antimicrobials: Clindamycin, atovaquone  Subjective: Patient seen and examined the bedside this morning.  Remains hemodynamically stable.  Still confused and disoriented and cannot give appropriate information.  Family members were present at the bedside today.  He ate his breakfast. Objective: Vitals:   08/15/18 0012 08/15/18 0300 08/15/18 0407 08/15/18 0931  BP: 110/62  109/70 104/71  Pulse: 78  75 84  Resp: 18  18 15   Temp: 98.2 F (36.8 C)   (!) 97.5 F (36.4 C)  TempSrc: Oral   Oral  SpO2: 100%  99% 100%  Weight:      Height:  5\' 8"  (1.727 m)     No intake or output data in the 24 hours ending 08/15/18 1137 Filed Weights   08/10/18 1700  Weight: 61.2 kg    Examination:  General exam: Appears calm and comfortable ,Not in distress,average built HEENT: Right eye deviated on the lateral side,Oral mucosa moist, Ear/Nose normal on gross exam Respiratory system: Bilateral equal air entry, normal vesicular breath sounds, no wheezes or crackles  Cardiovascular system: S1 & S2 heard, RRR. No JVD, murmurs, rubs, gallops or clicks. No pedal edema. Gastrointestinal system: Abdomen is nondistended, soft and nontender. No organomegaly or masses felt. Normal bowel sounds heard. Central nervous system: Alert but not oriented.  Extremities: No edema, no clubbing ,no cyanosis, distal peripheral pulses palpable. Skin: No rashes, lesions or ulcers,no icterus ,no pallor .  Data Reviewed: I have personally reviewed following labs and imaging studies  CBC: Recent Labs  Lab 08/10/18 1221 08/10/18 2148 08/11/18 0339 08/11/18 1240 08/12/18 0617 08/13/18 0505 08/14/18 0607  WBC 3.8* 3.4* 3.9*  --  4.2 4.1  5.2  NEUTROABS 2.0  --   --   --  2.3 1.8 2.9  HGB 13.1 13.1 12.4* 12.6* 12.9* 12.8* 13.6  HCT 40.8 40.9 38.4*  --  39.1 39.2 41.3  MCV 85.4 83.5 83.3  --  83.0 84.7 83.8  PLT 333 348 334  --  347 315 643   Basic Metabolic Panel: Recent Labs  Lab 08/11/18 0339 08/12/18 0617 08/13/18 0505 08/14/18 0607 08/15/18 0411  NA 129* 128* 128* 130* 131*  K 3.9 4.0 4.4 4.6 4.1  CL 101 100 99 102 101  CO2 22 21* 18* 17* 22  GLUCOSE 103* 93 92 87 103*  BUN 7 7 9 12 14   CREATININE 0.87 0.96 1.03 0.91 0.90  CALCIUM 8.3* 8.5* 8.5* 8.7* 8.7*   GFR: Estimated Creatinine Clearance: 105.8 mL/min (by C-G formula based on SCr of 0.9 mg/dL). Liver Function Tests: Recent Labs  Lab 08/10/18 1221  AST 35  ALT 21  ALKPHOS 46  BILITOT 0.5  PROT 8.0  ALBUMIN 3.3*   No results for input(s): LIPASE, AMYLASE in the last 168 hours. Recent Labs  Lab 08/10/18 1221  AMMONIA 17   Coagulation Profile: No results for input(s): INR, PROTIME in the last 168 hours. Cardiac Enzymes: No results for input(s): CKTOTAL, CKMB, CKMBINDEX, TROPONINI in the last 168 hours. BNP (last 3 results) No results for input(s): PROBNP in the last 8760 hours. HbA1C: No results for input(s): HGBA1C in the last 72 hours. CBG: No results for input(s): GLUCAP in the last 168 hours. Lipid Profile: No results for input(s): CHOL, HDL, LDLCALC, TRIG, CHOLHDL, LDLDIRECT in the last 72 hours. Thyroid Function Tests: No results for input(s): TSH, T4TOTAL, FREET4, T3FREE, THYROIDAB in the last 72 hours. Anemia Panel: No results for input(s): VITAMINB12, FOLATE, FERRITIN, TIBC, IRON, RETICCTPCT in the last 72 hours. Sepsis Labs: No results for input(s): PROCALCITON, LATICACIDVEN in the last 168 hours.  Recent Results (from the past 240 hour(s))  Anaerobic culture     Status: None (Preliminary result)   Collection Time: 08/11/18 11:20 AM  Result Value Ref Range Status   Specimen Description CSF  Final   Special Requests NONE   Final   Gram Stain PENDING  Incomplete   Culture   Final    NO GROWTH 2 DAYS NO ANAEROBES ISOLATED; CULTURE IN PROGRESS FOR 5 DAYS Performed at [facility], [address] [place], [place] [address]    Report Status PENDING  Incomplete  CSF culture     Status: None   Collection Time: 08/11/18 11:20 AM  Result Value Ref Range Status   Specimen Description CSF  Final   Special Requests NONE  Final   Gram Stain NO WBC SEEN NO ORGANISMS SEEN   Final   Culture   Final    NO GROWTH 3 DAYS Performed at [facility], [place] [address] [place], [place] [address]    Report Status 08/14/2018 FINAL  Final  Toxoplasma Gondii, PCR     Status: Abnormal   Collection Time: 08/11/18 11:20 AM  Result Value Ref Range Status   Toxoplasma Gondii, PCR Positive (A) Negative Final    Comment: (NOTE) Toxoplasma gondii DNA Detected. This test was developed and its performance  characteristics determined by [employer]. It has not been cleared or approved by the U.S. Food and Drug Administration. The FDA has determined that such clearance or approval is not necessary. This test is used for clinical purposes. It should not be regarded as investigational or research. Performed At: [facility] [place], [place] [id number] Rush Farmer MD DX:[id number]   Culture, blood (Routine X 2) w Reflex to ID Panel     Status: None (Preliminary result)   Collection Time: 08/12/18 11:12 AM  Result Value Ref Range Status   Specimen Description BLOOD RIGHT ANTECUBITAL  Final   Special Requests   Final    BOTTLES DRAWN AEROBIC AND ANAEROBIC Blood Culture adequate volume   Culture   Final    NO GROWTH 3 DAYS Performed at [facility], [address] [place], [place] [address]    Report Status PENDING  Incomplete  Culture, blood (Routine X 2) w Reflex to ID Panel     Status: None (Preliminary result)   Collection Time: 08/12/18 11:19 AM  Result Value Ref Range  Status   Specimen Description BLOOD LEFT ANTECUBITAL  Final   Special Requests   Final    BOTTLES DRAWN AEROBIC AND ANAEROBIC Blood Culture adequate volume   Culture   Final    NO GROWTH 3 DAYS Performed at [facility], [place] [address], [place], [place] [address]    Report Status PENDING  Incomplete         Radiology Studies: Ct Head W & Wo Contrast  Result Date: 08/14/2018 CLINICAL DATA:  In cephalopathy. HIV. EXAM: CT HEAD WITHOUT AND WITH CONTRAST TECHNIQUE: Contiguous axial images were obtained from the base of the skull through the vertex without and with intravenous contrast CONTRAST:  67mL OMNIPAQUE IOHEXOL 300 MG/ML  SOLN COMPARISON:  Brain MRI 08/10/2018 FINDINGS: Brain: Extensive edema at the level of the basal ganglia on the left is similar to the prior MRI. There is an 11 mm focus of hemorrhage in the left lentiform nucleus with intrinsic T1 hyperintensity and faint susceptibility artifact present on the prior MRI suggesting that the hemorrhage is not new. Numerous enhancing lesions are again seen throughout both cerebral and cerebellar hemispheres without evidence of substantial interval progression compared to the MRI. The largest lesion is again noted in the left basal ganglia measuring 2.3 cm. Mild edema associated with a right temporoparietal lesion is unchanged. A 5 mm hyperdense lesion in the posterior left temporal lobe may also be hemorrhagic. There is no evidence of acute large territory infarct. Effacement of the left lateral ventricle due to the vasogenic edema is unchanged with minimal local rightward midline shift. There is no extra-axial fluid collection. Cerebral volume is normal. Vascular: Grossly patent major dural venous sinuses. Skull: No fracture or focal osseous lesion. Sinuses/Orbits: Visualized paranasal sinuses and mastoid air cells are clear. Unremarkable orbits. Other: None. IMPRESSION: 1. Numerous enhancing brain lesions without evidence of significant  progression since the recent MRI consistent with the clinical diagnosis of toxoplasmosis. 2. Unchanged extensive edema in the left basal ganglia with a 11 mm hemorrhagic lesion in the left lentiform nucleus. Electronically Signed   By: Logan Bores M.D.   On: 08/14/2018 16:06        Scheduled Meds: . atovaquone  750 mg Oral BID WC  . feeding supplement  1 Container Oral TID BM  . multivitamins with iron  1 tablet Oral Daily  . thiamine  100 mg Oral  Daily   Continuous Infusions: . clindamycin (CLEOCIN) IV 900 mg (08/15/18 0421)     LOS: 5 days    Time spent: 35 mins.More than 50% of that time was spent in counseling and/or coordination of care.      Shelly Coss, MD [facility] Pager [phone]  If 7PM-7AM, please contact night-coverage [email or web address] Password [facility] 08/15/2018, 11:37 AM

## 2018-08-15 NOTE — Progress Notes (Addendum)
INFECTIOUS DISEASE PROGRESS NOTE  ID: Elie Confer [name] is a 27 y.o. male with  Active Problems:   Encephalitis   Hyponatremia   Substance abuse ([place])   Brain lesion   Confusion  Subjective: confused  Abtx:  Anti-infectives (From admission, onward)   Start     Dose/Rate Route Frequency Ordered Stop   08/13/18 2000  atovaquone (MEPRON) 750 MG/5ML suspension 750 mg     750 mg Oral 2 times daily with meals 08/13/18 1154     08/13/18 1230  clindamycin (CLEOCIN) IVPB 900 mg     900 mg 100 mL/hr over 30 Minutes Intravenous Every 8 hours 08/13/18 1154     08/13/18 1230  atovaquone (MEPRON) 750 MG/5ML suspension 750 mg     750 mg Oral NOW 08/13/18 1154 08/13/18 1330   08/10/18 1830  sulfamethoxazole-trimethoprim (BACTRIM) 306.08 mg in dextrose 5 % 250 mL IVPB  Status:  Discontinued     10 mg/kg/day  61.2 kg 269.1 mL/hr over 60 Minutes Intravenous Every 12 hours 08/10/18 1802 08/13/18 1154      Medications:  Scheduled: . atovaquone  750 mg Oral BID WC  . feeding supplement  1 Container Oral TID BM  . multivitamins with iron  1 tablet Oral Daily  . thiamine  100 mg Oral Daily    Objective: Vital signs in last 24 hours: Temp:  [97.9 F (36.6 C)-98.3 F (36.8 C)] 98.2 F (36.8 C) (12/02 0012) Pulse Rate:  [75-85] 75 (12/02 0407) Resp:  [18-20] 18 (12/02 0407) BP: (101-114)/(60-70) 109/70 (12/02 0407) SpO2:  [99 %-100 %] 99 % (12/02 0407)   General appearance: alert and delirious Resp: clear to auscultation bilaterally Cardio: regular rate and rhythm GI: normal findings: bowel sounds normal and soft, non-tender Neurologic: Mental status: alertness: alert, orientation: believes he is at home.   Lab Results Recent Labs    08/13/18 0505 08/14/18 0607 08/15/18 0411  WBC 4.1 5.2  --   HGB 12.8* 13.6  --   HCT 39.2 41.3  --   NA 128* 130* 131*  K 4.4 4.6 4.1  CL 99 102 101  CO2 18* 17* 22  BUN 9 12 14   CREATININE 1.03 0.91 0.90   Liver Panel No  results for input(s): PROT, ALBUMIN, AST, ALT, ALKPHOS, BILITOT, BILIDIR, IBILI in the last 72 hours. Sedimentation Rate No results for input(s): ESRSEDRATE in the last 72 hours. C-Reactive Protein No results for input(s): CRP in the last 72 hours.  Microbiology: Recent Results (from the past 240 hour(s))  Anaerobic culture     Status: None (Preliminary result)   Collection Time: 08/11/18 11:20 AM  Result Value Ref Range Status   Specimen Description CSF  Final   Special Requests NONE  Final   Gram Stain PENDING  Incomplete   Culture   Final    NO GROWTH 2 DAYS NO ANAEROBES ISOLATED; CULTURE IN PROGRESS FOR 5 DAYS Performed at [facility], [address], [place], [place] [address]    Report Status PENDING  Incomplete  CSF culture     Status: None   Collection Time: 08/11/18 11:20 AM  Result Value Ref Range Status   Specimen Description CSF  Final   Special Requests NONE  Final   Gram Stain NO WBC SEEN NO ORGANISMS SEEN   Final   Culture   Final    NO GROWTH 3 DAYS Performed at [facility], [place],  [place] [address]    Report Status 08/14/2018 FINAL  Final  Toxoplasma Gondii, PCR     Status: Abnormal   Collection Time: 08/11/18 11:20 AM  Result Value Ref Range Status   Toxoplasma Gondii, PCR Positive (A) Negative Final    Comment: (NOTE) Toxoplasma gondii DNA Detected. This test was developed and its performance characteristics determined by [employer]. It has not been cleared or approved by the U.S. Food and Drug Administration. The FDA has determined that such clearance or approval is not necessary. This test is used for clinical purposes. It should not be regarded as investigational or research. Performed At: [facility] [place], [place] [id number] Rush Farmer MD EL:[id number]   Culture, blood (Routine X 2) w Reflex to ID Panel     Status: None (Preliminary result)   Collection Time: 08/12/18 11:12  AM  Result Value Ref Range Status   Specimen Description BLOOD RIGHT ANTECUBITAL  Final   Special Requests   Final    BOTTLES DRAWN AEROBIC AND ANAEROBIC Blood Culture adequate volume   Culture   Final    NO GROWTH 3 DAYS Performed at [facility], [address] [place], [place] [address]    Report Status PENDING  Incomplete  Culture, blood (Routine X 2) w Reflex to ID Panel     Status: None (Preliminary result)   Collection Time: 08/12/18 11:19 AM  Result Value Ref Range Status   Specimen Description BLOOD LEFT ANTECUBITAL  Final   Special Requests   Final    BOTTLES DRAWN AEROBIC AND ANAEROBIC Blood Culture adequate volume   Culture   Final    NO GROWTH 3 DAYS Performed at [facility], [place] [address] [place], [place] [address]    Report Status PENDING  Incomplete    Studies/Results: Ct Head W & Wo Contrast  Result Date: 08/14/2018 CLINICAL DATA:  In cephalopathy. HIV. EXAM: CT HEAD WITHOUT AND WITH CONTRAST TECHNIQUE: Contiguous axial images were obtained from the base of the skull through the vertex without and with intravenous contrast CONTRAST:  22mL OMNIPAQUE IOHEXOL 300 MG/ML  SOLN COMPARISON:  Brain MRI 08/10/2018 FINDINGS: Brain: Extensive edema at the level of the basal ganglia on the left is similar to the prior MRI. There is an 11 mm focus of hemorrhage in the left lentiform nucleus with intrinsic T1 hyperintensity and faint susceptibility artifact present on the prior MRI suggesting that the hemorrhage is not new. Numerous enhancing lesions are again seen throughout both cerebral and cerebellar hemispheres without evidence of substantial interval progression compared to the MRI. The largest lesion is again noted in the left basal ganglia measuring 2.3 cm. Mild edema associated with a right temporoparietal lesion is unchanged. A 5 mm hyperdense lesion in the posterior left temporal lobe may also be hemorrhagic. There is no evidence of acute large territory  infarct. Effacement of the left lateral ventricle due to the vasogenic edema is unchanged with minimal local rightward midline shift. There is no extra-axial fluid collection. Cerebral volume is normal. Vascular: Grossly patent major dural venous sinuses. Skull: No fracture or focal osseous lesion. Sinuses/Orbits: Visualized paranasal sinuses and mastoid air cells are clear. Unremarkable orbits. Other: None. IMPRESSION: 1. Numerous enhancing brain lesions without evidence of significant progression since the recent MRI consistent with the clinical diagnosis of toxoplasmosis. 2. Unchanged extensive edema in the left basal ganglia with a 11 mm hemorrhagic lesion in the left lentiform nucleus. Electronically Signed   By:  Logan Bores M.D.   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  Spoke with family member, did not talk about his specific dx of AIDS/HIV.  Await pyramethamine Would consider starting ART after 7 days of therapy.           Bobby Rumpf MD, FACP Infectious Diseases (pager) [phone] [email or web address] 08/15/2018, 8:34 AM  LOS: 5 days

## 2018-08-16 LAB — REFLEX TO GENOSURE(R) MG EDI
HIV GENOSURE(R): UNDETERMINED
HIV GenoSure: UNDETERMINED
LCLS REPORT - GSRMGL: UNDETERMINED

## 2018-08-16 LAB — ACID FAST SMEAR (AFB, MYCOBACTERIA): Acid Fast Smear: NEGATIVE

## 2018-08-16 LAB — HIV-1 RNA, PCR (GRAPH) RFX/GENO EDI
HIV-1 RNA BY PCR: 397000 copies/mL
HIV-1 RNA Quant, Log: 5.599 log10copy/mL

## 2018-08-16 LAB — HIV 1/2 AB DIFFERENTIATION
HIV 1 Ab: POSITIVE — AB
HIV 2 Ab: NEGATIVE

## 2018-08-16 LAB — HIV ANTIBODY (ROUTINE TESTING W REFLEX): HIV Screen 4th Generation wRfx: REACTIVE — AB

## 2018-08-16 MED ORDER — LIDOCAINE HCL URETHRAL/MUCOSAL 2 % EX GEL
1.0000 "application " | Freq: Once | CUTANEOUS | Status: DC
  Filled 2018-08-16: qty 5

## 2018-08-16 MED ORDER — POLYVINYL ALCOHOL 1.4 % OP SOLN
1.0000 [drp] | OPHTHALMIC | Status: DC | PRN
  Filled 2018-08-16 (×2): qty 15

## 2018-08-16 NOTE — Progress Notes (Signed)
PROGRESS NOTE    Thomas Park  QIH:[id number] DOB: [birth year] DOA: 08/10/2018 PCP: Patient, No Pcp Per   Brief Narrative: Patient is a 27 year old male recently diagnosed with HIV presented with flulike symptoms and disseminated rash.  He was also very confused and disoriented on presentation.  MRI of the brain showed numerous enhancing lesions greatest at the basal ganglia on the left side consistent with toxoplasmosis, mild to moderate vasogenic edema with regional mass-effect.  ID consulted, started on antibiotics.  Currently managed for acute encephalopathy related to toxoplasmosis in the setting of immunosuppression due to his HIV  Assessment & Plan:   Active Problems:   Encephalitis   Hyponatremia   Substance abuse ([place])   Brain lesion   Confusion  Encephalitis/toxoplasmosis: MRI finding as above.  Started on antibiotics.  Currently on clindamycin and atovaquone.ID waiting for by Pyrimethamine, sulfadiazine and leucovorin. Patient is still confused and disoriented.He is oriented to person only. But mental status is slowly improving. CT head done  to for follow-up and  ongoing encephalopathy.  It did not show  progression of the lesions as seen in the MRI. Echocardiogram showed normal left ventricular systolic function, filamentous structure in the right atrium possibly anatomic variant.  Blood cultures have been negative.  HIV: ID following.  Waiting on starting on antiretroviral. He has history of substance abuse.  No clinical signs of active withdrawal. Has low CD4 count and high viral load.  Chest x-ray did not show any pneumonia.  Hepatitis panel negative.  Gold TB test negative, RPR negative.  Peripheral visual loss: Right eye deviated towards lateral side.  Ophthalmology already saw the patient.  This is secondary to intracranial lesions.  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  Start on fluid restriction.  Last Sodium is  131.  Debility/deconditioning/disposition: PT evaluated  the patient and recommended CIR.  CIR consulted  Nutrition Problem: Increased nutrient needs Etiology: chronic illness      DVT prophylaxis: SCD Code Status: Full Family Communication: Family members present  at the bedside Disposition Plan: Undetermined at this point.   Consultants: ID  Procedures: None  Antimicrobials: Clindamycin, atovaquone  Subjective: Patient seen and examined the bedside this morning.  Remains hemodynamically stable.  Still confused and disoriented and cannot give appropriate information.  Mental status might have slightly improved.  He tries to communicate, smiles and shake hands.  Objective: Vitals:   08/15/18 1957 08/15/18 2353 08/16/18 0352 08/16/18 0842  BP: (!) 100/51 102/66 106/63 111/74  Pulse: 80 88 79 89  Resp: 16 20 17 16   Temp: (!) 97.3 F (36.3 C) 97.8 F (36.6 C) 97.7 F (36.5 C) 97.7 F (36.5 C)  TempSrc: Oral Oral Oral Oral  SpO2: 100% 99% 96% 100%  Weight:      Height:        Intake/Output Summary (Last 24 hours) at 08/16/2018 1135 Last data filed at 08/15/2018 1752 Gross per 24 hour  Intake 120 ml  Output -  Net 120 ml   Filed Weights   08/10/18 1700  Weight: 61.2 kg    Examination:  General exam: Appears calm and comfortable ,Not in distress,average built HEENT: Right eye deviated on the lateral side,Oral mucosa moist, Ear/Nose normal on gross exam Respiratory system: Bilateral equal air entry, normal vesicular breath sounds, no wheezes or crackles  Cardiovascular system: S1 & S2 heard, RRR. No JVD, murmurs, rubs, gallops or clicks. No pedal edema. Gastrointestinal system: Abdomen is nondistended, soft  and nontender. No organomegaly or masses felt. Normal bowel sounds heard. Central nervous system: Alert but not oriented.  Extremities: No edema, no clubbing ,no cyanosis, distal peripheral pulses palpable. Skin: No rashes, lesions or ulcers,no icterus ,no  pallor .     Data Reviewed: I have personally reviewed following labs and imaging studies  CBC: Recent Labs  Lab 08/10/18 1221 08/10/18 2148 08/11/18 0339 08/11/18 1240 08/12/18 0617 08/13/18 0505 08/14/18 0607  WBC 3.8* 3.4* 3.9*  --  4.2 4.1 5.2  NEUTROABS 2.0  --   --   --  2.3 1.8 2.9  HGB 13.1 13.1 12.4* 12.6* 12.9* 12.8* 13.6  HCT 40.8 40.9 38.4*  --  39.1 39.2 41.3  MCV 85.4 83.5 83.3  --  83.0 84.7 83.8  PLT 333 348 334  --  347 315 325   Basic Metabolic Panel: Recent Labs  Lab 08/11/18 0339 08/12/18 0617 08/13/18 0505 08/14/18 0607 08/15/18 0411  NA 129* 128* 128* 130* 131*  K 3.9 4.0 4.4 4.6 4.1  CL 101 100 99 102 101  CO2 22 21* 18* 17* 22  GLUCOSE 103* 93 92 87 103*  BUN 7 7 9 12 14   CREATININE 0.87 0.96 1.03 0.91 0.90  CALCIUM 8.3* 8.5* 8.5* 8.7* 8.7*   GFR: Estimated Creatinine Clearance: 105.8 mL/min (by C-G formula based on SCr of 0.9 mg/dL). Liver Function Tests: Recent Labs  Lab 08/10/18 1221  AST 35  ALT 21  ALKPHOS 46  BILITOT 0.5  PROT 8.0  ALBUMIN 3.3*   No results for input(s): LIPASE, AMYLASE in the last 168 hours. Recent Labs  Lab 08/10/18 1221  AMMONIA 17   Coagulation Profile: No results for input(s): INR, PROTIME in the last 168 hours. Cardiac Enzymes: No results for input(s): CKTOTAL, CKMB, CKMBINDEX, TROPONINI in the last 168 hours. BNP (last 3 results) No results for input(s): PROBNP in the last 8760 hours. HbA1C: No results for input(s): HGBA1C in the last 72 hours. CBG: No results for input(s): GLUCAP in the last 168 hours. Lipid Profile: No results for input(s): CHOL, HDL, LDLCALC, TRIG, CHOLHDL, LDLDIRECT in the last 72 hours. Thyroid Function Tests: No results for input(s): TSH, T4TOTAL, FREET4, T3FREE, THYROIDAB in the last 72 hours. Anemia Panel: No results for input(s): VITAMINB12, FOLATE, FERRITIN, TIBC, IRON, RETICCTPCT in the last 72 hours. Sepsis Labs: No results for input(s): PROCALCITON,  LATICACIDVEN in the last 168 hours.  Recent Results (from the past 240 hour(s))  Anaerobic culture     Status: None   Collection Time: 08/11/18 11:20 AM  Result Value Ref Range Status   Specimen Description CSF  Final   Special Requests NONE  Final   Culture   Final    NO ANAEROBES ISOLATED Performed at [facility], [address] [place], [place] [address]    Report Status 08/15/2018 FINAL  Final  CSF culture     Status: None   Collection Time: 08/11/18 11:20 AM  Result Value Ref Range Status   Specimen Description CSF  Final   Special Requests NONE  Final   Gram Stain NO WBC SEEN NO ORGANISMS SEEN   Final   Culture   Final    NO GROWTH 3 DAYS Performed at [facility], [place] [address], [place], [place] [address]    Report Status 08/14/2018 FINAL  Final  Toxoplasma Gondii, PCR     Status: Abnormal   Collection Time: 08/11/18 11:20 AM  Result Value Ref Range  Status   Toxoplasma Gondii, PCR Positive (A) Negative Final    Comment: (NOTE) Toxoplasma gondii DNA Detected. This test was developed and its performance characteristics determined by [employer]. It has not been cleared or approved by the U.S. Food and Drug Administration. The FDA has determined that such clearance or approval is not necessary. This test is used for clinical purposes. It should not be regarded as investigational or research. Performed At: [facility] [place], [place] [id number] Rush Farmer MD [id number]   Culture, blood (Routine X 2) w Reflex to ID Panel     Status: None (Preliminary result)   Collection Time: 08/12/18 11:12 AM  Result Value Ref Range Status   Specimen Description BLOOD RIGHT ANTECUBITAL  Final   Special Requests   Final    BOTTLES DRAWN AEROBIC AND ANAEROBIC Blood Culture adequate volume   Culture   Final    NO GROWTH 3 DAYS Performed at [facility], [address] [place], [place] [address]    Report Status PENDING   Incomplete  Culture, blood (Routine X 2) w Reflex to ID Panel     Status: None (Preliminary result)   Collection Time: 08/12/18 11:19 AM  Result Value Ref Range Status   Specimen Description BLOOD LEFT ANTECUBITAL  Final   Special Requests   Final    BOTTLES DRAWN AEROBIC AND ANAEROBIC Blood Culture adequate volume   Culture   Final    NO GROWTH 3 DAYS Performed at [facility], [place] [address] [place], [place] [address]    Report Status PENDING  Incomplete         Radiology Studies: Ct Head W & Wo Contrast  Result Date: 08/14/2018 CLINICAL DATA:  In cephalopathy. HIV. EXAM: CT HEAD WITHOUT AND WITH CONTRAST TECHNIQUE: Contiguous axial images were obtained from the base of the skull through the vertex without and with intravenous contrast CONTRAST:  14mL OMNIPAQUE IOHEXOL 300 MG/ML  SOLN COMPARISON:  Brain MRI 08/10/2018 FINDINGS: Brain: Extensive edema at the level of the basal ganglia on the left is similar to the prior MRI. There is an 11 mm focus of hemorrhage in the left lentiform nucleus with intrinsic T1 hyperintensity and faint susceptibility artifact present on the prior MRI suggesting that the hemorrhage is not new. Numerous enhancing lesions are again seen throughout both cerebral and cerebellar hemispheres without evidence of substantial interval progression compared to the MRI. The largest lesion is again noted in the left basal ganglia measuring 2.3 cm. Mild edema associated with a right temporoparietal lesion is unchanged. A 5 mm hyperdense lesion in the posterior left temporal lobe may also be hemorrhagic. There is no evidence of acute large territory infarct. Effacement of the left lateral ventricle due to the vasogenic edema is unchanged with minimal local rightward midline shift. There is no extra-axial fluid collection. Cerebral volume is normal. Vascular: Grossly patent major dural venous sinuses. Skull: No fracture or focal osseous lesion. Sinuses/Orbits: Visualized  paranasal sinuses and mastoid air cells are clear. Unremarkable orbits. Other: None. IMPRESSION: 1. Numerous enhancing brain lesions without evidence of significant progression since the recent MRI consistent with the clinical diagnosis of toxoplasmosis. 2. Unchanged extensive edema in the left basal ganglia with a 11 mm hemorrhagic lesion in the left lentiform nucleus. Electronically Signed   By: Logan Bores M.D.   On: 08/14/2018 16:06        Scheduled Meds: . atovaquone  750 mg Oral BID WC  .  feeding supplement  1 Container Oral TID BM  . multivitamins with iron  1 tablet Oral Daily  . thiamine  100 mg Oral Daily   Continuous Infusions: . clindamycin (CLEOCIN) IV 900 mg (08/16/18 0447)     LOS: 6 days    Time spent: 25 mins.More than 50% of that time was spent in counseling and/or coordination of care.      Shelly Coss, MD [facility] Pager [phone]  If 7PM-7AM, please contact night-coverage [email or web address] Password [email or web address] 08/16/2018, 11:35 AM [email or web address]

## 2018-08-16 NOTE — Progress Notes (Signed)
3w13 Patient explained procedure by NT in spanish :70fr coude cath inserted by Marcello Fennel RN with Mellody Memos RN, patient tolerated well, urine return noted, sterile technique maintained

## 2018-08-16 NOTE — Progress Notes (Signed)
Pt has >576ml on bladder scan. Notified MD.

## 2018-08-16 NOTE — Progress Notes (Signed)
SLP Cancellation Note  Patient Details Name: Thomas Park MRN: [id number] DOB: [birth year]   Cancelled treatment:       Reason Eval/Treat Not Completed: Patient at procedure or test/unavailable. Pt just starting a bath. Talked with sister at bedside. She confirms significant short term memory loss. Will f/u for cognitive linguistic assessment.    [name], Katherene Ponto 08/16/2018, 10:51 AM

## 2018-08-16 NOTE — Progress Notes (Addendum)
Inpatient Rehabilitation Admissions Coordinator  I met with patient at bedside with his sister, Serita Grammes using [place] interpreter, [facility] # [phone]. 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 Family have been staying with patient here in hospital 24/7 and can provide 24/7 supervision at discharge. Sister notes his confusion and need for more therapy. We discussed a possible 2 to 3 week inpt rehab admission followed by family providing 24/7 care. Sister states we are awaiting arrival of new medication to treat this brain infection. I will follow up with planning possible admit to inpt rehab when medically ready.  Danne Baxter, RN, MSN Rehab Admissions Coordinator [phone] 08/16/2018 12:32 PM

## 2018-08-16 NOTE — Progress Notes (Signed)
INFECTIOUS DISEASE PROGRESS NOTE  ID: Thomas Park is a 27 y.o. male with  Active Problems:   Encephalitis   Hyponatremia   Substance abuse ([place])   Brain lesion   Confusion  Subjective: Awakens transiently, less agitated today.   Abtx:  Anti-infectives (From admission, onward)   Start     Dose/Rate Route Frequency Ordered Stop   08/13/18 2000  atovaquone (MEPRON) 750 MG/5ML suspension 750 mg     750 mg Oral 2 times daily with meals 08/13/18 1154     08/13/18 1230  clindamycin (CLEOCIN) IVPB 900 mg     900 mg 100 mL/hr over 30 Minutes Intravenous Every 8 hours 08/13/18 1154     08/13/18 1230  atovaquone (MEPRON) 750 MG/5ML suspension 750 mg     750 mg Oral NOW 08/13/18 1154 08/13/18 1330   08/10/18 1830  sulfamethoxazole-trimethoprim (BACTRIM) 306.08 mg in dextrose 5 % 250 mL IVPB  Status:  Discontinued     10 mg/kg/day  61.2 kg 269.1 mL/hr over 60 Minutes Intravenous Every 12 hours 08/10/18 1802 08/13/18 1154      Medications:  Scheduled: . atovaquone  750 mg Oral BID WC  . feeding supplement  1 Container Oral TID BM  . multivitamins with iron  1 tablet Oral Daily  . thiamine  100 mg Oral Daily    Objective: Vital signs in last 24 hours: Temp:  [97.3 F (36.3 C)-98.1 F (36.7 C)] 97.7 F (36.5 C) (12/03 0842) Pulse Rate:  [74-89] 89 (12/03 0842) Resp:  [15-20] 16 (12/03 0842) BP: (94-115)/(51-74) 111/74 (12/03 0842) SpO2:  [96 %-100 %] 100 % (12/03 0842)   General appearance: no distress Resp: clear to auscultation bilaterally Cardio: regular rate and rhythm GI: normal findings: bowel sounds normal and soft, non-tender  Lab Results Recent Labs    08/14/18 0607 08/15/18 0411  WBC 5.2  --   HGB 13.6  --   HCT 41.3  --   NA 130* 131*  K 4.6 4.1  CL 102 101  CO2 17* 22  BUN 12 14  CREATININE 0.91 0.90   Liver Panel No results for input(s): PROT, ALBUMIN, AST, ALT, ALKPHOS, BILITOT, BILIDIR, IBILI in the last 72 hours. Sedimentation  Rate No results for input(s): ESRSEDRATE in the last 72 hours. C-Reactive Protein No results for input(s): CRP in the last 72 hours.  Microbiology: Recent Results (from the past 240 hour(s))  Anaerobic culture     Status: None   Collection Time: 08/11/18 11:20 AM  Result Value Ref Range Status   Specimen Description CSF  Final   Special Requests NONE  Final   Culture   Final    NO ANAEROBES ISOLATED Performed at [facility], [address] [place], [place] [address]    Report Status 08/15/2018 FINAL  Final  CSF culture     Status: None   Collection Time: 08/11/18 11:20 AM  Result Value Ref Range Status   Specimen Description CSF  Final   Special Requests NONE  Final   Gram Stain NO WBC SEEN NO ORGANISMS SEEN   Final   Culture   Final    NO GROWTH 3 DAYS Performed at [facility], [place] [address] [place], [place] [address]    Report Status 08/14/2018 FINAL  Final  Toxoplasma Gondii, PCR     Status: Abnormal   Collection Time: 08/11/18 11:20 AM  Result Value Ref Range Status   Toxoplasma  Gondii, PCR Positive (A) Negative Final    Comment: (NOTE) Toxoplasma gondii DNA Detected. This test was developed and its performance characteristics determined by [employer]. It has not been cleared or approved by the U.S. Food and Drug Administration. The FDA has determined that such clearance or approval is not necessary. This test is used for clinical purposes. It should not be regarded as investigational or research. Performed At: [facility] [place], [place] [id number] Rush Farmer MD ON:[id number]   Culture, blood (Routine X 2) w Reflex to ID Panel     Status: None (Preliminary result)   Collection Time: 08/12/18 11:12 AM  Result Value Ref Range Status   Specimen Description BLOOD RIGHT ANTECUBITAL  Final   Special Requests   Final    BOTTLES DRAWN AEROBIC AND ANAEROBIC Blood Culture adequate volume   Culture   Final    NO  GROWTH 3 DAYS Performed at [facility], [address], [place], [place] [address]    Report Status PENDING  Incomplete  Culture, blood (Routine X 2) w Reflex to ID Panel     Status: None (Preliminary result)   Collection Time: 08/12/18 11:19 AM  Result Value Ref Range Status   Specimen Description BLOOD LEFT ANTECUBITAL  Final   Special Requests   Final    BOTTLES DRAWN AEROBIC AND ANAEROBIC Blood Culture adequate volume   Culture   Final    NO GROWTH 3 DAYS Performed at [facility], [place] [address], [place], [place] [address]    Report Status PENDING  Incomplete    Studies/Results: Ct Head W & Wo Contrast  Result Date: 08/14/2018 CLINICAL DATA:  In cephalopathy. HIV. EXAM: CT HEAD WITHOUT AND WITH CONTRAST TECHNIQUE: Contiguous axial images were obtained from the base of the skull through the vertex without and with intravenous contrast CONTRAST:  66mL OMNIPAQUE IOHEXOL 300 MG/ML  SOLN COMPARISON:  Brain MRI 08/10/2018 FINDINGS: Brain: Extensive edema at the level of the basal ganglia on the left is similar to the prior MRI. There is an 11 mm focus of hemorrhage in the left lentiform nucleus with intrinsic T1 hyperintensity and faint susceptibility artifact present on the prior MRI suggesting that the hemorrhage is not new. Numerous enhancing lesions are again seen throughout both cerebral and cerebellar hemispheres without evidence of substantial interval progression compared to the MRI. The largest lesion is again noted in the left basal ganglia measuring 2.3 cm. Mild edema associated with a right temporoparietal lesion is unchanged. A 5 mm hyperdense lesion in the posterior left temporal lobe may also be hemorrhagic. There is no evidence of acute large territory infarct. Effacement of the left lateral ventricle due to the vasogenic edema is unchanged with minimal local rightward midline shift. There is no extra-axial fluid collection. Cerebral volume is normal. Vascular: Grossly  patent major dural venous sinuses. Skull: No fracture or focal osseous lesion. Sinuses/Orbits: Visualized paranasal sinuses and mastoid air cells are clear. Unremarkable orbits. Other: None. IMPRESSION: 1. Numerous enhancing brain lesions without evidence of significant progression since the recent MRI consistent with the clinical diagnosis of toxoplasmosis. 2. Unchanged extensive edema in the left basal ganglia with a 11 mm hemorrhagic lesion in the left lentiform nucleus. Electronically Signed   By: Logan Bores M.D.   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  Will start ART around 12-11 (post 14 days of toxo rx) after review of literature.          Bobby Rumpf MD, FACP Infectious Diseases (pager) [phone] [email or web address] 08/16/2018, 9:30 AM  LOS: 6 days

## 2018-08-16 NOTE — Progress Notes (Signed)
Attempted to In and Out cath patient, but met resistance with no output. Pt complained of pain. MD notified.

## 2018-08-17 LAB — CULTURE, BLOOD (ROUTINE X 2)
Culture: NO GROWTH
Culture: NO GROWTH
Special Requests: ADEQUATE
Special Requests: ADEQUATE

## 2018-08-17 LAB — BASIC METABOLIC PANEL
Anion gap: 11 (ref 5–15)
BUN: 12 mg/dL (ref 6–20)
CO2: 21 mmol/L — ABNORMAL LOW (ref 22–32)
CREATININE: 0.71 mg/dL (ref 0.61–1.24)
Calcium: 8.7 mg/dL — ABNORMAL LOW (ref 8.9–10.3)
Chloride: 101 mmol/L (ref 98–111)
GFR calc Af Amer: >60 mL/min (ref >60)
Glucose, Bld: 96 mg/dL (ref 70–99)
Potassium: 3.7 mmol/L (ref 3.5–5.1)
Sodium: 133 mmol/L — ABNORMAL LOW (ref 135–145)

## 2018-08-17 LAB — HIV 1/2 AB DIFFERENTIATION
HIV 1 Ab: POSITIVE — AB
HIV 2 Ab: NEGATIVE

## 2018-08-17 LAB — HIV ANTIBODY (ROUTINE TESTING W REFLEX): HIV Screen 4th Generation wRfx: REACTIVE — AB

## 2018-08-17 LAB — HLA B*5701: HLA B 5701: NEGATIVE

## 2018-08-17 MED ORDER — SODIUM CHLORIDE 0.9 % IV SOLN
INTRAVENOUS | Status: DC | PRN
  Administered 2018-08-17: 250 mL via INTRAVENOUS

## 2018-08-17 NOTE — Progress Notes (Signed)
PROGRESS NOTE    Thomas Park  EHU:[id number] DOB: 1991-01-19 DOA: 08/10/2018 PCP: Patient, No Pcp Per   Brief Narrative: Patient is a 27 year old male recently diagnosed with HIV presented with flulike symptoms and disseminated rash.  He was also very confused and disoriented on presentation.  MRI of the brain showed numerous enhancing lesions greatest at the basal ganglia on the left side consistent with toxoplasmosis, mild to moderate vasogenic edema with regional mass-effect.  ID consulted, started on antibiotics.  Currently managed for acute encephalopathy related to toxoplasmosis in the setting of immunosuppression due to his HIV  Assessment & Plan:   Active Problems:   Encephalitis   Hyponatremia   Substance abuse ([place])   Brain lesion   Confusion  Encephalitis/toxoplasmosis: MRI finding as above.  Started on antibiotics.  Currently on clindamycin and atovaquone.ID waiting for by Pyrimethamine, sulfadiazine and leucovorin. Patient is still confused and disoriented.He is oriented to person only. But mental status is slowly improving. CT head done  to for follow-up and  ongoing encephalopathy.  It did not show  progression of the lesions as seen in the MRI. Echocardiogram showed normal left ventricular systolic function, filamentous structure in the right atrium possibly anatomic variant.  Blood cultures have been negative.  HIV: ID following.  Waiting on starting on antiretroviral. He has history of substance abuse.  No clinical signs of active withdrawal. Has low CD4 count and high viral load.  Chest x-ray did not show any pneumonia.  Hepatitis panel negative.  Gold TB test negative, RPR negative.  Peripheral visual loss: Both eyes deviated towards the left lateral side.  Ophthalmology already saw the patient.  This is secondary to intracranial lesions.  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  Start on fluid restriction.  Last Sodium is  133.  Debility/deconditioning/disposition: PT evaluated  the patient and recommended CIR.  CIR consulted.Waiting for transfer.  Urinary retention: Foley inserted yesterday.  Will do a voiding trial in 1 to 2 days  Nutrition Problem: Increased nutrient needs Etiology: chronic illness      DVT prophylaxis: SCD Code Status: Full Family Communication: Family members present  at the bedside Disposition Plan: CIR as soon as possible  Consultants: ID  Procedures: None  Antimicrobials: Clindamycin, atovaquone  Subjective: Patient seen and examined the bedside this morning.  Remains hemodynamically stable.  Still confused and disoriented and cannot give appropriate information.  Mental status might have slightly improved.  He tries to communicate, smiles and shake hands.  Now both eyes deviated to the left lateral side  Objective: Vitals:   08/16/18 2120 08/17/18 0005 08/17/18 0515 08/17/18 0752  BP: 99/73 118/66 112/75 115/72  Pulse: 96 (!) 102 100 (!) 103  Resp: 18 18 18 17   Temp: 98.1 F (36.7 C) 97.8 F (36.6 C) 98.3 F (36.8 C) 98.3 F (36.8 C)  TempSrc: Oral Oral Oral Oral  SpO2: 100% 99% 100% 96%  Weight:      Height:        Intake/Output Summary (Last 24 hours) at 08/17/2018 1145 Last data filed at 08/17/2018 0900 Gross per 24 hour  Intake 965.14 ml  Output 1225 ml  Net -259.86 ml   Filed Weights   08/10/18 1700  Weight: 61.2 kg    Examination:  General exam: Appears calm and comfortable ,Not in distress,average built HEENT: Both eyes deviated on the left lateral side,Oral mucosa moist, Ear/Nose normal on gross exam Respiratory system: Bilateral equal air  entry, normal vesicular breath sounds, no wheezes or crackles  Cardiovascular system: S1 & S2 heard, RRR. No JVD, murmurs, rubs, gallops or clicks. No pedal edema. Gastrointestinal system: Abdomen is nondistended, soft and nontender. No organomegaly or masses felt. Normal bowel sounds heard. Central nervous  system: Alert but not oriented.  Extremities: No edema, no clubbing ,no cyanosis, distal peripheral pulses palpable. Skin: No rashes, lesions or ulcers,no icterus ,no pallor .     Data Reviewed: I have personally reviewed following labs and imaging studies  CBC: Recent Labs  Lab 08/10/18 1221 08/10/18 2148 08/11/18 0339 08/11/18 1240 08/12/18 0617 08/13/18 0505 08/14/18 0607  WBC 3.8* 3.4* 3.9*  --  4.2 4.1 5.2  NEUTROABS 2.0  --   --   --  2.3 1.8 2.9  HGB 13.1 13.1 12.4* 12.6* 12.9* 12.8* 13.6  HCT 40.8 40.9 38.4*  --  39.1 39.2 41.3  MCV 85.4 83.5 83.3  --  83.0 84.7 83.8  PLT 333 348 334  --  347 315 696   Basic Metabolic Panel: Recent Labs  Lab 08/12/18 0617 08/13/18 0505 08/14/18 0607 08/15/18 0411 08/17/18 0410  NA 128* 128* 130* 131* 133*  K 4.0 4.4 4.6 4.1 3.7  CL 100 99 102 101 101  CO2 21* 18* 17* 22 21*  GLUCOSE 93 92 87 103* 96  BUN 7 9 12 14 12   CREATININE 0.96 1.03 0.91 0.90 0.71  CALCIUM 8.5* 8.5* 8.7* 8.7* 8.7*   GFR: Estimated Creatinine Clearance: 119 mL/min (by C-G formula based on SCr of 0.71 mg/dL). Liver Function Tests: Recent Labs  Lab 08/10/18 1221  AST 35  ALT 21  ALKPHOS 46  BILITOT 0.5  PROT 8.0  ALBUMIN 3.3*   No results for input(s): LIPASE, AMYLASE in the last 168 hours. Recent Labs  Lab 08/10/18 1221  AMMONIA 17   Coagulation Profile: No results for input(s): INR, PROTIME in the last 168 hours. Cardiac Enzymes: No results for input(s): CKTOTAL, CKMB, CKMBINDEX, TROPONINI in the last 168 hours. BNP (last 3 results) No results for input(s): PROBNP in the last 8760 hours. HbA1C: No results for input(s): HGBA1C in the last 72 hours. CBG: No results for input(s): GLUCAP in the last 168 hours. Lipid Profile: No results for input(s): CHOL, HDL, LDLCALC, TRIG, CHOLHDL, LDLDIRECT in the last 72 hours. Thyroid Function Tests: No results for input(s): TSH, T4TOTAL, FREET4, T3FREE, THYROIDAB in the last 72 hours. Anemia  Panel: No results for input(s): VITAMINB12, FOLATE, FERRITIN, TIBC, IRON, RETICCTPCT in the last 72 hours. Sepsis Labs: No results for input(s): PROCALCITON, LATICACIDVEN in the last 168 hours.  Recent Results (from the past 240 hour(s))  Acid Fast Smear (AFB)     Status: None   Collection Time: 08/11/18 11:20 AM  Result Value Ref Range Status   AFB Specimen Processing Concentration  Final   Acid Fast Smear Negative  Final    Comment: (NOTE) Performed At: [facility] [place], [place] [id number] Rush Farmer MD GM:[id number]    Source (AFB) CSF  Final    Comment: Performed at [facility], [place] [address], [place], [place] [address]  Anaerobic culture     Status: None   Collection Time: 08/11/18 11:20 AM  Result Value Ref Range Status   Specimen Description CSF  Final   Special Requests NONE  Final   Culture   Final    NO ANAEROBES ISOLATED Performed at [facility], [place] [address],  [place], [place] [address]    Report Status 08/15/2018 FINAL  Final  CSF culture     Status: None   Collection Time: 08/11/18 11:20 AM  Result Value Ref Range Status   Specimen Description CSF  Final   Special Requests NONE  Final   Gram Stain NO WBC SEEN NO ORGANISMS SEEN   Final   Culture   Final    NO GROWTH 3 DAYS Performed at [facility], [place] [address] [place], [place] [address]    Report Status 08/14/2018 FINAL  Final  Toxoplasma Gondii, PCR     Status: Abnormal   Collection Time: 08/11/18 11:20 AM  Result Value Ref Range Status   Toxoplasma Gondii, PCR Positive (A) Negative Final    Comment: (NOTE) Toxoplasma gondii DNA Detected. This test was developed and its performance characteristics determined by [employer]. It has not been cleared or approved by the U.S. Food and Drug Administration. The FDA has determined that such clearance or approval is not necessary. This test is used for clinical purposes. It should not be  regarded as investigational or research. Performed At: [facility] [place] [address] [place], [place] [id number] Rush Farmer MD TK:[id number]   Culture, blood (Routine X 2) w Reflex to ID Panel     Status: None   Collection Time: 08/12/18 11:12 AM  Result Value Ref Range Status   Specimen Description BLOOD RIGHT ANTECUBITAL  Final   Special Requests   Final    BOTTLES DRAWN AEROBIC AND ANAEROBIC Blood Culture adequate volume   Culture   Final    NO GROWTH 5 DAYS Performed at [facility], [address] [place], [place] [address]    Report Status 08/17/2018 FINAL  Final  Culture, blood (Routine X 2) w Reflex to ID Panel     Status: None   Collection Time: 08/12/18 11:19 AM  Result Value Ref Range Status   Specimen Description BLOOD LEFT ANTECUBITAL  Final   Special Requests   Final    BOTTLES DRAWN AEROBIC AND ANAEROBIC Blood Culture adequate volume   Culture   Final    NO GROWTH 5 DAYS Performed at [facility], [place] [address] [name], [place] [address]    Report Status 08/17/2018 FINAL  Final         Radiology Studies: No results found.      Scheduled Meds: . atovaquone  750 mg Oral BID WC  . feeding supplement  1 Container Oral TID BM  . lidocaine  1 application Urethral Once  . multivitamins with iron  1 tablet Oral Daily  . thiamine  100 mg Oral Daily   Continuous Infusions: . clindamycin (CLEOCIN) IV 900 mg (08/17/18 0356)     LOS: 7 days    Time spent: 25 mins.More than 50% of that time was spent in counseling and/or coordination of care.      Shelly Coss, MD [facility] Pager [phone]  If 7PM-7AM, please contact night-coverage [email or web address] Password [email or web address] 08/17/2018, 11:45 AM [email or web address]

## 2018-08-17 NOTE — Progress Notes (Signed)
Physical Therapy Treatment Patient Details Name: Thomas Park MRN: [id number] DOB: Apr 10, 1991 Today's Date: 08/17/2018    History of Present Illness Koren Sermersheim de Gwendalyn Ege is a 27 y.o.right handed non-English-speaking male  with history of polysubstance abuse recently diagnosed HIV. 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 Presented 08/10/2018 with altered mental status as well as unintentional weight loss over the past 3 months. MRI of the brain reviewed with numerous masses.     PT Comments    Pt in bed with sister at bedside. In house [place] used this session Clarita Crane). Pt participated in bed mobility, transfers and gait training. Pt remains very impulsive. Pt required Mod A + 2 with 2 person HHA during gait due to unsteadiness. Pt able to follow 1 step commands with increased time. Pt would only respond to interpreter saying "I'm fine, everything is fine" during session. Pt would benefit from continued PT in order to progress toward stated goals and maximize functional independence. Pt remains appropriate for CIR based on current functional status.   Follow Up Recommendations  CIR     Equipment Recommendations  Other (comment)(TBD)    Recommendations for Other Services Rehab consult     Precautions / Restrictions Precautions Precautions: Fall Restrictions Weight Bearing Restrictions: No    Mobility  Bed Mobility Overal bed mobility: Needs Assistance Bed Mobility: Supine to Sit     Supine to sit: Min assist Sit to supine: Min assist   General bed mobility comments: min assist for safety, patient moving impulsively. Once sitting on EOB pt tried to lay in supine.  Transfers Overall transfer level: Needs assistance Equipment used: 2 person hand held assist Transfers: Sit to/from Stand Sit to Stand: Min assist;+2 physical assistance          General transfer comment: impulsive wtih mobility, cues for hand placement to push from seated surface.  Min A to boost into standing and Min Assist + 2 to steady once in standing  Ambulation/Gait Ambulation/Gait assistance: Mod assist;+2 safety/equipment;+2 physical assistance Gait Distance (Feet): 50 Feet Assistive device: 2 person hand held assist Gait Pattern/deviations: Step-to pattern;Decreased stride length;Staggering right;Staggering left;Narrow base of support Gait velocity: decreased   General Gait Details: patient unable to coordinate or control movement in any direction. In room pt with L lateral lean. Max assist +2 with ambulation due to undsteadiness.  VC for step length.   Stairs             Wheelchair Mobility    Modified Rankin (Stroke Patients Only)       Balance Overall balance assessment: Needs assistance   Sitting balance-Leahy Scale: Fair Sitting balance - Comments: able to sit with min guard at times.      Standing balance-Leahy Scale: Poor Standing balance comment: reliant on external support                            Cognition Arousal/Alertness: Awake/alert Behavior During Therapy: Restless;Impulsive Overall Cognitive Status: Impaired/Different from baseline Area of Impairment: Problem solving;Safety/judgement;Awareness;Attention;Following commands                   Current Attention Level: Focused   Following Commands: Follows one step commands inconsistently;Follows one step commands with increased time Safety/Judgement: Decreased awareness of safety;Decreased awareness of deficits Awareness: Intellectual Problem Solving: Slow processing;Difficulty sequencing;Requires verbal cues;Requires  tactile cues;Decreased initiation General Comments: Pt would respond to the interpreter with "I'm fine,everything is fine" when asked to do a task.      Exercises Total Joint Exercises Ankle Circles/Pumps: AAROM;Right;10  reps;Left;Supine Heel Slides: AROM;10 reps;Right;Left;Supine(increased time to perform) Long Arc Quad: AROM;10 reps;Right;Left;Seated    General Comments General comments (skin integrity, edema, etc.): Pt's sister present during session      Pertinent Vitals/Pain Faces Pain Scale: No hurt    Home Living                      Prior Function            PT Goals (current goals can now be found in the care plan section) Acute Rehab PT Goals Patient Stated Goal: to get better - per sister PT Goal Formulation: With family Time For Goal Achievement: 08/27/18 Potential to Achieve Goals: Good Progress towards PT goals: Progressing toward goals    Frequency    Min 3X/week      PT Plan Current plan remains appropriate    Co-evaluation              AM-PAC PT "6 Clicks" Mobility   Outcome Measure  Help needed turning from your back to your side while in a flat bed without using bedrails?: A Little Help needed moving from lying on your back to sitting on the side of a flat bed without using bedrails?: A Little Help needed moving to and from a bed to a chair (including a wheelchair)?: A Lot Help needed standing up from a chair using your arms (e.g., wheelchair or bedside chair)?: A Lot Help needed to walk in hospital room?: A Lot Help needed climbing 3-5 steps with a railing? : A Lot 6 Click Score: 14    End of Session Equipment Utilized During Treatment: Gait belt Activity Tolerance: Patient limited by fatigue Patient left: in chair;with call bell/phone within reach;with chair alarm set;with family/visitor present Nurse Communication: Mobility status PT Visit Diagnosis: Unsteadiness on feet (R26.81);Other symptoms and signs involving the nervous system (R29.898)     Time: 4462-8638 PT Time Calculation (min) (ACUTE ONLY): 28 min  Charges:  $Gait Training: 8-22 mins $Therapeutic Activity: 8-22 mins                    [address] SPTA   [place] 08/17/2018, 2:53 PM

## 2018-08-17 NOTE — Progress Notes (Signed)
INFECTIOUS DISEASE PROGRESS NOTE  ID: Thomas Park Thomas Park is a 27 y.o. male with  Active Problems:   Encephalitis   Hyponatremia   Substance abuse ([place])   Brain lesion   Confusion  Subjective: More alert, believes he is at home.  Has headaches  Abtx:  Anti-infectives (From admission, onward)   Start     Dose/Rate Route Frequency Ordered Stop   08/13/18 2000  atovaquone (MEPRON) 750 MG/5ML suspension 750 mg     750 mg Oral 2 times daily with meals 08/13/18 1154     08/13/18 1230  clindamycin (CLEOCIN) IVPB 900 mg     900 mg 100 mL/hr over 30 Minutes Intravenous Every 8 hours 08/13/18 1154     08/13/18 1230  atovaquone (MEPRON) 750 MG/5ML suspension 750 mg     750 mg Oral NOW 08/13/18 1154 08/13/18 1330   08/10/18 1830  sulfamethoxazole-trimethoprim (BACTRIM) 306.08 mg in dextrose 5 % 250 mL IVPB  Status:  Discontinued     10 mg/kg/day  61.2 kg 269.1 mL/hr over 60 Minutes Intravenous Every 12 hours 08/10/18 1802 08/13/18 1154      Medications:  Scheduled: . atovaquone  750 mg Oral BID WC  . feeding supplement  1 Container Oral TID BM  . lidocaine  1 application Urethral Once  . multivitamins with iron  1 tablet Oral Daily  . thiamine  100 mg Oral Daily    Objective: Vital signs in last 24 hours: Temp:  [97.4 F (36.3 C)-98.3 F (36.8 C)] 98.3 F (36.8 C) (12/04 0515) Pulse Rate:  [88-102] 100 (12/04 0515) Resp:  [15-18] 18 (12/04 0515) BP: (99-118)/(66-75) 112/75 (12/04 0515) SpO2:  [99 %-100 %] 100 % (12/04 0515)   General appearance: alert, cooperative, no distress and confused Eyes: positive findings: conjunctiva: 3+ injection Resp: clear to auscultation bilaterally Cardio: regular rate and rhythm GI: normal findings: bowel sounds normal and soft, non-tender  Lab Results Recent Labs    08/15/18 0411 08/17/18 0410  NA 131* 133*  K 4.1 3.7  CL 101 101  CO2 22 21*  BUN 14 12  CREATININE 0.90 0.71   Liver Panel No results for input(s):  PROT, ALBUMIN, AST, ALT, ALKPHOS, BILITOT, BILIDIR, IBILI in the last 72 hours. Sedimentation Rate No results for input(s): ESRSEDRATE in the last 72 hours. C-Reactive Protein No results for input(s): CRP in the last 72 hours.  Microbiology: Recent Results (from the past 240 hour(s))  Acid Fast Smear (AFB)     Status: None   Collection Time: 08/11/18 11:20 AM  Result Value Ref Range Status   AFB Specimen Processing Concentration  Final   Acid Fast Smear Negative  Final    Comment: (NOTE) Performed At: [facility] [place], [place] [id number] Rush Farmer MD [id number]    Source (AFB) CSF  Final    Comment: Performed at [facility], [place] [address], [place], [place] [address]  Anaerobic culture     Status: None   Collection Time: 08/11/18 11:20 AM  Result Value Ref Range Status   Specimen Description CSF  Final   Special Requests NONE  Final   Culture   Final    NO ANAEROBES ISOLATED Performed at [facility], [place] [address] [place], [place] [address]    Report Status 08/15/2018 FINAL  Final  CSF culture     Status: None   Collection Time: 08/11/18 11:20 AM  Result Value Ref Range  Status   Specimen Description CSF  Final   Special Requests NONE  Final   Gram Stain NO WBC SEEN NO ORGANISMS SEEN   Final   Culture   Final    NO GROWTH 3 DAYS Performed at [facility], [address] [place], [place] [address]    Report Status 08/14/2018 FINAL  Final  Toxoplasma Gondii, PCR     Status: Abnormal   Collection Time: 08/11/18 11:20 AM  Result Value Ref Range Status   Toxoplasma Gondii, PCR Positive (A) Negative Final    Comment: (NOTE) Toxoplasma gondii DNA Detected. This test was developed and its performance characteristics determined by [employer]. It has not been cleared or approved by the U.S. Food and Drug Administration. The FDA has determined that such clearance or approval is not necessary. This test is  used for clinical purposes. It should not be regarded as investigational or research. Performed At: [facility] [place], [place] [id number] Rush Farmer MD [id number]   Culture, blood (Routine X 2) w Reflex to ID Panel     Status: None (Preliminary result)   Collection Time: 08/12/18 11:12 AM  Result Value Ref Range Status   Specimen Description BLOOD RIGHT ANTECUBITAL  Final   Special Requests   Final    BOTTLES DRAWN AEROBIC AND ANAEROBIC Blood Culture adequate volume   Culture   Final    NO GROWTH 4 DAYS Performed at [facility], [place] [address], [place], [place] [address]    Report Status PENDING  Incomplete  Culture, blood (Routine X 2) w Reflex to ID Panel     Status: None (Preliminary result)   Collection Time: 08/12/18 11:19 AM  Result Value Ref Range Status   Specimen Description BLOOD LEFT ANTECUBITAL  Final   Special Requests   Final    BOTTLES DRAWN AEROBIC AND ANAEROBIC Blood Culture adequate volume   Culture   Final    NO GROWTH 4 DAYS Performed at [facility], [place] [address], [place], [place] [address]    Report Status PENDING  Incomplete    Studies/Results: No results found.   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 Hold on ART til ~ 12-11         Bobby Rumpf MD, FACP Infectious Diseases (pager) [phone] [email or web address] 08/17/2018, 8:45 AM  LOS: 7 days

## 2018-08-18 ENCOUNTER — Encounter (HOSPITAL_COMMUNITY): Payer: Self-pay | Admitting: Nurse Practitioner

## 2018-08-18 ENCOUNTER — Other Ambulatory Visit: Payer: Self-pay

## 2018-08-18 ENCOUNTER — Inpatient Hospital Stay (HOSPITAL_COMMUNITY)
Admission: RE | Admit: 2018-08-18 | Discharge: 2018-09-02 | DRG: 091 | Disposition: A | Discharge status: Home Health | Payer: Self-pay | Source: Intra-hospital | Attending: Physical Medicine & Rehabilitation | Admitting: Physical Medicine & Rehabilitation

## 2018-08-18 DIAGNOSIS — G9349 Other encephalopathy: Secondary | ICD-10-CM | POA: Diagnosis present

## 2018-08-18 DIAGNOSIS — H547 Unspecified visual loss: Secondary | ICD-10-CM | POA: Diagnosis present

## 2018-08-18 DIAGNOSIS — Z8661 Personal history of infections of the central nervous system: Secondary | ICD-10-CM

## 2018-08-18 DIAGNOSIS — H1589 Other disorders of sclera: Secondary | ICD-10-CM | POA: Diagnosis present

## 2018-08-18 DIAGNOSIS — H518 Other specified disorders of binocular movement: Secondary | ICD-10-CM | POA: Diagnosis present

## 2018-08-18 DIAGNOSIS — R2689 Other abnormalities of gait and mobility: Principal | ICD-10-CM | POA: Diagnosis present

## 2018-08-18 DIAGNOSIS — G9340 Encephalopathy, unspecified: Secondary | ICD-10-CM

## 2018-08-18 DIAGNOSIS — L270 Generalized skin eruption due to drugs and medicaments taken internally: Secondary | ICD-10-CM | POA: Diagnosis not present

## 2018-08-18 DIAGNOSIS — B582 Toxoplasma meningoencephalitis: Secondary | ICD-10-CM

## 2018-08-18 DIAGNOSIS — I10 Essential (primary) hypertension: Secondary | ICD-10-CM | POA: Diagnosis present

## 2018-08-18 DIAGNOSIS — R4189 Other symptoms and signs involving cognitive functions and awareness: Secondary | ICD-10-CM | POA: Diagnosis present

## 2018-08-18 DIAGNOSIS — T370X5A Adverse effect of sulfonamides, initial encounter: Secondary | ICD-10-CM | POA: Diagnosis not present

## 2018-08-18 DIAGNOSIS — B20 Human immunodeficiency virus [HIV] disease: Secondary | ICD-10-CM

## 2018-08-18 DIAGNOSIS — R339 Retention of urine, unspecified: Secondary | ICD-10-CM | POA: Diagnosis present

## 2018-08-18 DIAGNOSIS — R269 Unspecified abnormalities of gait and mobility: Secondary | ICD-10-CM

## 2018-08-18 DIAGNOSIS — G0490 Encephalitis and encephalomyelitis, unspecified: Secondary | ICD-10-CM | POA: Diagnosis present

## 2018-08-18 MED ORDER — ONDANSETRON HCL 4 MG/2ML IJ SOLN: 4.0000 mg | Freq: Four times a day (QID) | INTRAMUSCULAR | Status: DC | PRN

## 2018-08-18 MED ORDER — NONFORMULARY OR COMPOUNDED ITEM: 1.0000 | Freq: Every morning | Status: DC

## 2018-08-18 MED ORDER — SULFADIAZINE 500 MG PO TABS: 1500.0000 mg | ORAL_TABLET | Freq: Four times a day (QID) | ORAL | Status: DC

## 2018-08-18 MED ORDER — CLINDAMYCIN PHOSPHATE 900 MG/50ML IV SOLN: 900.0000 mg | Freq: Three times a day (TID) | INTRAVENOUS | Status: DC

## 2018-08-18 MED ORDER — ACETAMINOPHEN 650 MG RE SUPP: 650.0000 mg | Freq: Four times a day (QID) | RECTAL | Status: DC | PRN

## 2018-08-18 MED ORDER — SULFADIAZINE 500 MG PO TABS
1500.0000 mg | ORAL_TABLET | Freq: Four times a day (QID) | ORAL | Status: DC
  Filled 2018-08-18: qty 3

## 2018-08-18 MED ORDER — POLYVINYL ALCOHOL 1.4 % OP SOLN
1.0000 [drp] | OPHTHALMIC | Status: DC | PRN
  Administered 2018-08-19 – 2018-08-24 (×6): 1 [drp] via OPHTHALMIC
  Filled 2018-08-18: qty 15

## 2018-08-18 MED ORDER — NONFORMULARY OR COMPOUNDED ITEM
4.0000 | Freq: Every morning | Status: DC
  Filled 2018-08-18 (×2): qty 1

## 2018-08-18 MED ORDER — TAB-A-VITE/IRON PO TABS: 1.0000 | ORAL_TABLET | Freq: Every day | ORAL | 0 refills | Status: DC

## 2018-08-18 MED ORDER — THIAMINE HCL 100 MG PO TABS: 100.0000 mg | ORAL_TABLET | Freq: Every day | ORAL | Status: DC

## 2018-08-18 MED ORDER — ACETAMINOPHEN 325 MG PO TABS
650.0000 mg | ORAL_TABLET | Freq: Four times a day (QID) | ORAL | Status: DC | PRN
  Administered 2018-09-02: 650 mg via ORAL
  Filled 2018-08-18: qty 2

## 2018-08-18 MED ORDER — VITAMIN B-1 100 MG PO TABS
100.0000 mg | ORAL_TABLET | Freq: Every day | ORAL | Status: DC
  Administered 2018-08-19 – 2018-09-02 (×15): 100 mg via ORAL
  Filled 2018-08-18 (×15): qty 1

## 2018-08-18 MED ORDER — ATOVAQUONE 750 MG/5ML PO SUSP: 750.0000 mg | Freq: Two times a day (BID) | ORAL | 0 refills | Status: DC

## 2018-08-18 MED ORDER — NONFORMULARY OR COMPOUNDED ITEM
4.0000 | Freq: Once | Status: AC
  Administered 2018-08-18: 4 via ORAL
  Filled 2018-08-18: qty 1

## 2018-08-18 MED ORDER — NONFORMULARY OR COMPOUNDED ITEM: 4.0000 | Freq: Every morning | Status: DC

## 2018-08-18 MED ORDER — NONFORMULARY OR COMPOUNDED ITEM
1.0000 | Freq: Every day | Status: DC
  Administered 2018-08-19 – 2018-09-02 (×15): 1 via ORAL
  Filled 2018-08-18 (×16): qty 1

## 2018-08-18 MED ORDER — NONFORMULARY OR COMPOUNDED ITEM: 4.0000 | Freq: Once | Status: DC

## 2018-08-18 MED ORDER — ACETAMINOPHEN 325 MG PO TABS: 650.0000 mg | ORAL_TABLET | Freq: Four times a day (QID) | ORAL | Status: DC | PRN

## 2018-08-18 MED ORDER — POLYVINYL ALCOHOL 1.4 % OP SOLN: 1.0000 [drp] | OPHTHALMIC | 0 refills | Status: DC | PRN

## 2018-08-18 MED ORDER — SORBITOL 70 % SOLN: 30.0000 mL | Freq: Every day | Status: DC | PRN

## 2018-08-18 MED ORDER — SULFADIAZINE 500 MG PO TABS
1500.0000 mg | ORAL_TABLET | Freq: Four times a day (QID) | ORAL | Status: DC
  Administered 2018-08-18 – 2018-08-24 (×21): 1500 mg via ORAL
  Filled 2018-08-18 (×24): qty 3

## 2018-08-18 MED ORDER — ONDANSETRON HCL 4 MG PO TABS: 4.0000 mg | ORAL_TABLET | Freq: Four times a day (QID) | ORAL | 0 refills | Status: DC | PRN

## 2018-08-18 MED ORDER — LEUCOVORIN CALCIUM 5 MG PO TABS: 15.0000 mg | ORAL_TABLET | Freq: Four times a day (QID) | ORAL | Status: DC

## 2018-08-18 MED ORDER — ONDANSETRON HCL 4 MG PO TABS
4.0000 mg | ORAL_TABLET | Freq: Four times a day (QID) | ORAL | Status: DC | PRN
  Administered 2018-08-24 – 2018-09-02 (×12): 4 mg via ORAL
  Filled 2018-08-18 (×13): qty 1

## 2018-08-18 MED ORDER — PYRIMETHAMINE 25 MG PO TABS: 75.0000 mg | ORAL_TABLET | Freq: Every day | ORAL | Status: DC

## 2018-08-18 MED ORDER — TAB-A-VITE/IRON PO TABS
1.0000 | ORAL_TABLET | Freq: Every day | ORAL | Status: DC
  Administered 2018-08-19 – 2018-09-01 (×15): 1 via ORAL
  Filled 2018-08-18 (×15): qty 1

## 2018-08-18 MED ORDER — BOOST / RESOURCE BREEZE PO LIQD CUSTOM
1.0000 | Freq: Three times a day (TID) | ORAL | Status: DC
  Administered 2018-08-18 – 2018-09-02 (×36): 1 via ORAL
  Filled 2018-08-18 (×11): qty 1

## 2018-08-18 MED ORDER — NONFORMULARY OR COMPOUNDED ITEM
4.0000 | Freq: Every morning | Status: DC
  Filled 2018-08-18: qty 1

## 2018-08-18 NOTE — Progress Notes (Signed)
RN from 3W states she removed patients foley around 1600.  Due to void by 2200.  Brita Romp, RN

## 2018-08-18 NOTE — Progress Notes (Signed)
Inpatient Rehabilitation Admissions Coordinator  I have bed available today for inpt rehab admit. I met with pt an dhis sister, Hilda and using [employer] interpretor, Lazaro, # [id number]. I discussed that we were looking to admit him for 1 to 2 weeks of intensive therapy to prepare him for d/c home with family. Also discussed the need for continued 24/7 assist of family even when discharged. Hilda is in agreement. I contacted Dr. Adhikari , bedside RN, RN CM and SW. I will make the arrangements to admit today .  Barbara Boyette, RN, MSN Rehab Admissions Coordinator [phone] 08/18/2018 2:26 PM    

## 2018-08-18 NOTE — Progress Notes (Signed)
PROGRESS NOTE    [name]  SWN:[id number] DOB: 15-Jun-1991 DOA: 08/10/2018 PCP: Patient, No Pcp Per   Brief Narrative: Patient is a 27 year old male recently diagnosed with HIV presented with flulike symptoms and disseminated rash.  He was also very confused and disoriented on presentation.  MRI of the brain showed numerous enhancing lesions greatest at the basal ganglia on the left side consistent with toxoplasmosis, mild to moderate vasogenic edema with regional mass-effect.  ID consulted, started on antibiotics.  Currently managed for acute encephalopathy related to toxoplasmosis in the setting of immunosuppression due to his HIV  Assessment & Plan:   Active Problems:   Encephalitis   Hyponatremia   Substance abuse ([place])   Brain lesion   Confusion  Encephalitis/toxoplasmosis: MRI finding as above.  Started on antibiotics.  Currently on clindamycin and atovaquone.ID waiting for by Pyrimethamine, sulfadiazine and leucovorin. Patient is still confused and disoriented.He is oriented to person only. But mental status might be  slowly improving. CT head done  to for follow-up and  ongoing encephalopathy.  It did not show  progression of the lesions as seen in the MRI.Plan for another CT head follow up. Echocardiogram showed normal left ventricular systolic function, filamentous structure in the right atrium possibly anatomic variant.  Blood cultures have been negative.  HIV: ID following.  Waiting on starting on antiretroviral. He has history of substance abuse.  No clinical signs of active withdrawal. Has low CD4 count and high viral load.  Chest x-ray did not show any pneumonia.  Hepatitis panel negative.  Gold TB test negative, RPR negative.  Peripheral visual loss: Both eyes deviated towards the left lateral side.  Ophthalmology already saw the patient.  This is secondary to intracranial lesions.  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  Start  on fluid restriction.  Last Sodium is 133.  Debility/deconditioning/disposition: PT evaluated  the patient and recommended CIR.  CIR consulted.Waiting for transfer.  Urinary retention: Foley was inserted 2 days ago.  Will do a voiding trial today.  Nutrition Problem: Increased nutrient needs Etiology: chronic illness      DVT prophylaxis: SCD Code Status: Full Family Communication: Family members present  at the bedside Disposition Plan: CIR  Consultants: ID  Procedures: None  Antimicrobials: Clindamycin, atovaquone  Subjective: Patient seen and examined the bedside this morning.  Remains hemodynamically stable.  Still confused and disoriented and cannot give appropriate information.  Mental status might have slightly improved.  He tries to communicate, smiles and shake hands.  Both eyes deviated to the left lateral side. No significant change from yesterday  Objective: Vitals:   08/17/18 1153 08/17/18 2012 08/18/18 0443 08/18/18 0800  BP: 113/70 111/72 117/73 117/74  Pulse: (!) 101 97 97 90  Resp: 16 18 18 16   Temp: 99.2 F (37.3 C) 98 F (36.7 C) 97.7 F (36.5 C) 98 F (36.7 C)  TempSrc: Oral Oral Oral Oral  SpO2: 99% 93% 100% 100%  Weight:      Height:        Intake/Output Summary (Last 24 hours) at 08/18/2018 1144 Last data filed at 08/18/2018 0600 Gross per 24 hour  Intake 290.45 ml  Output 1500 ml  Net -1209.55 ml   Filed Weights   08/10/18 1700  Weight: 61.2 kg    Examination:  General exam: Appears calm and comfortable ,Not in distress,average built HEENT: Both eyes deviated on the left lateral side,Oral mucosa moist, Ear/Nose normal on gross  exam Respiratory system: Bilateral equal air entry, normal vesicular breath sounds, no wheezes or crackles  Cardiovascular system: S1 & S2 heard, RRR. No JVD, murmurs, rubs, gallops or clicks. No pedal edema. Gastrointestinal system: Abdomen is nondistended, soft and nontender. No organomegaly or masses felt.  Normal bowel sounds heard. Central nervous system: Alert but not oriented.  Extremities: No edema, no clubbing ,no cyanosis, distal peripheral pulses palpable. Skin: No rashes, lesions or ulcers,no icterus ,no pallor .     Data Reviewed: I have personally reviewed following labs and imaging studies  CBC: Recent Labs  Lab 08/11/18 1240 08/12/18 0617 08/13/18 0505 08/14/18 0607  WBC  --  4.2 4.1 5.2  NEUTROABS  --  2.3 1.8 2.9  HGB 12.6* 12.9* 12.8* 13.6  HCT  --  39.1 39.2 41.3  MCV  --  83.0 84.7 83.8  PLT  --  347 315 177   Basic Metabolic Panel: Recent Labs  Lab 08/12/18 0617 08/13/18 0505 08/14/18 0607 08/15/18 0411 08/17/18 0410  NA 128* 128* 130* 131* 133*  K 4.0 4.4 4.6 4.1 3.7  CL 100 99 102 101 101  CO2 21* 18* 17* 22 21*  GLUCOSE 93 92 87 103* 96  BUN 7 9 12 14 12   CREATININE 0.96 1.03 0.91 0.90 0.71  CALCIUM 8.5* 8.5* 8.7* 8.7* 8.7*   GFR: Estimated Creatinine Clearance: 119 mL/min (by C-G formula based on SCr of 0.71 mg/dL). Liver Function Tests: No results for input(s): AST, ALT, ALKPHOS, BILITOT, PROT, ALBUMIN in the last 168 hours. No results for input(s): LIPASE, AMYLASE in the last 168 hours. No results for input(s): AMMONIA in the last 168 hours. Coagulation Profile: No results for input(s): INR, PROTIME in the last 168 hours. Cardiac Enzymes: No results for input(s): CKTOTAL, CKMB, CKMBINDEX, TROPONINI in the last 168 hours. BNP (last 3 results) No results for input(s): PROBNP in the last 8760 hours. HbA1C: No results for input(s): HGBA1C in the last 72 hours. CBG: No results for input(s): GLUCAP in the last 168 hours. Lipid Profile: No results for input(s): CHOL, HDL, LDLCALC, TRIG, CHOLHDL, LDLDIRECT in the last 72 hours. Thyroid Function Tests: No results for input(s): TSH, T4TOTAL, FREET4, T3FREE, THYROIDAB in the last 72 hours. Anemia Panel: No results for input(s): VITAMINB12, FOLATE, FERRITIN, TIBC, IRON, RETICCTPCT in the last 72  hours. Sepsis Labs: No results for input(s): PROCALCITON, LATICACIDVEN in the last 168 hours.  Recent Results (from the past 240 hour(s))  Acid Fast Smear (AFB)     Status: None   Collection Time: 08/11/18 11:20 AM  Result Value Ref Range Status   AFB Specimen Processing Concentration  Final   Acid Fast Smear Negative  Final    Comment: (NOTE) Performed At: [facility] [place], [place] [id number] Rush Farmer MD [id number]    Source (AFB) CSF  Final    Comment: Performed at [facility], [place] [address] [place], [place] [address]  Anaerobic culture     Status: None   Collection Time: 08/11/18 11:20 AM  Result Value Ref Range Status   Specimen Description CSF  Final   Special Requests NONE  Final   Culture   Final    NO ANAEROBES ISOLATED Performed at [facility], [place] [address] [place], [place] [address]    Report Status 08/15/2018 FINAL  Final  CSF culture     Status: None   Collection Time: 08/11/18 11:20 AM  Result Value Ref Range Status  Specimen Description CSF  Final   Special Requests NONE  Final   Gram Stain NO WBC SEEN NO ORGANISMS SEEN   Final   Culture   Final    NO GROWTH 3 DAYS Performed at [facility], [address] [place], [place] [address]    Report Status 08/14/2018 FINAL  Final  Fungus Culture With Stain     Status: None (Preliminary result)   Collection Time: 08/11/18 11:20 AM  Result Value Ref Range Status   Fungus Stain Final report  Final    Comment: (NOTE) Performed At: [facility] [place], [place] [id number] Rush Farmer MD [id number]    Fungus (Mycology) Culture PENDING  Incomplete   Fungal Source CSF  Final    Comment: Performed at [facility], [place] [address] [place], [place] [address]  Andee Poles, PCR     Status: Abnormal   Collection Time: 08/11/18 11:20 AM  Result Value Ref Range Status   Toxoplasma Gondii, PCR Positive (A) Negative  Final    Comment: (NOTE) Toxoplasma gondii DNA Detected. This test was developed and its performance characteristics determined by [employer]. It has not been cleared or approved by the U.S. Food and Drug Administration. The FDA has determined that such clearance or approval is not necessary. This test is used for clinical purposes. It should not be regarded as investigational or research. Performed At: [facility] [place], [place] [id number] Rush Farmer MD [id number]   Fungus Culture Result     Status: None   Collection Time: 08/11/18 11:20 AM  Result Value Ref Range Status   Result 1 Comment  Final    Comment: (NOTE) KOH/Calcofluor preparation:  no fungus observed. Performed At: [facility] [address] [place], [place] [id number] Rush Farmer MD [id number]   Culture, blood (Routine X 2) w Reflex to ID Panel     Status: None   Collection Time: 08/12/18 11:12 AM  Result Value Ref Range Status   Specimen Description BLOOD RIGHT ANTECUBITAL  Final   Special Requests   Final    BOTTLES DRAWN AEROBIC AND ANAEROBIC Blood Culture adequate volume   Culture   Final    NO GROWTH 5 DAYS Performed at [facility], [address], [place], [place] [address]    Report Status 08/17/2018 FINAL  Final  Culture, blood (Routine X 2) w Reflex to ID Panel     Status: None   Collection Time: 08/12/18 11:19 AM  Result Value Ref Range Status   Specimen Description BLOOD LEFT ANTECUBITAL  Final   Special Requests   Final    BOTTLES DRAWN AEROBIC AND ANAEROBIC Blood Culture adequate volume   Culture   Final    NO GROWTH 5 DAYS Performed at [facility], [place] [address] [place], [name] [address]    Report Status 08/17/2018 FINAL  Final         Radiology Studies: No results found.      Scheduled Meds: . atovaquone  750 mg Oral BID WC  . feeding supplement  1 Container Oral TID BM  . lidocaine  1 application  Urethral Once  . multivitamins with iron  1 tablet Oral Daily  . thiamine  100 mg Oral Daily   Continuous Infusions: . sodium chloride 10 mL/hr at 08/18/18 0600  . clindamycin (CLEOCIN) IV Stopped (08/18/18 0530)     LOS: 8 days    Time spent: 25  mins.More than 50% of that time was spent in counseling and/or coordination of care.      Shelly Coss, MD [facility] Pager [phone]  If 7PM-7AM, please contact night-coverage [email or web address] Password [email or web address] 08/18/2018, 11:44 AM [email or web address]

## 2018-08-18 NOTE — Progress Notes (Signed)
Inpatient Rehabilitation Admissions Coordinator  I await bed availability to admit pt to inpt rehab . I do not have a bed at present.  Danne Baxter, RN, MSN Rehab Admissions Coordinator [phone] 08/18/2018 1:16 PM

## 2018-08-18 NOTE — Progress Notes (Signed)
Thomas Arn, MD  Physician  Physical Medicine and Rehabilitation  Consult Note  Signed  Date of Service:  08/15/2018 7:29 AM       Related encounter: ED to Hosp-Admission (Discharged) from 08/10/2018 in [facility]      Signed      Expand All Collapse All    Show:Clear all [x] Manual[x] Template[] Copied  Added by: [x] Angiulli, Lavon Paganini, PA-C[x] Posey Pronto Domenick Bookbinder, MD  [] Hover for details      Physical Medicine and Rehabilitation Consult Reason for Consult:  Decreased functional mobility Referring Physician:   [facility]   HPI: Thomas Park is a 27 y.o.right handed non-English-speaking male  with history of polysubstance abuse recently diagnosed HIV. 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 Presented 08/10/2018 with altered mental status as well as unintentional weight loss over the past 3 months. MRI of the brain reviewed with numerous masses.  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 Mild to moderate vasogenic edema with regional mass effect greatest at the left basal ganglia. CD4 T-cell less than 10. 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 Therapy evaluation completed with recommendations of physical medicine rehabilitation consult.  Review of Systems  Unable to perform ROS: Language       Past Medical History:  Diagnosis Date  . Anemia    [name] 08/10/2018  . HIV (human immunodeficiency virus infection) ([place])    dx'd 07/2018  . 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 NO PAST SURGERIES     History reviewed. No pertinent family history.,  Unable to obtain  from patient Social History:  reports that he has never smoked. He has never used smokeless tobacco. He reports that he drinks about 6.0 standard drinks of alcohol per week. He reports that he has current or past drug history. Allergies: No Known Allergies       Medications Prior to Admission  Medication Sig Dispense Refill  . fluconazole (DIFLUCAN) 200 MG tablet Take 200 mg by mouth daily.  0  . ibuprofen (ADVIL,MOTRIN) 200 MG tablet Take 400 mg by mouth every 6 (six) hours as needed for fever or headache.    [name] Kitchen OVER THE COUNTER MEDICATION Apply 2 drops to eye daily as needed (for red eyes, itchy).      Home: Home Living Family/patient expects to be discharged to:: Private residence Living Arrangements: Other relatives Available Help at Discharge: Family(lives with sister) Type of Home: House Home Access: Level entry Home Layout: One level Home Equipment: None  Functional History: Prior Function Level of Independence: Independent Comments: was working every day prior to this admission Functional Status:  Mobility: Bed Mobility Overal bed mobility: Needs Assistance Bed Mobility: Supine to Sit, Sit to Supine Supine to sit: Min assist Sit to supine: Min assist General bed mobility comments: min assist for safety, patient moving impulsively  Transfers Overall transfer level: Needs assistance Equipment used: 1 person hand held assist Transfers: Sit to/from Stand Sit to Stand: Min assist General transfer comment: min assist for stability once powered up Ambulation/Gait Ambulation/Gait assistance: Max assist Gait Distance (Feet): 4 Feet Assistive device: 1 person hand held assist Gait Pattern/deviations: Ataxic, Step-to pattern, Decreased stride length, Staggering  right, Staggering left General Gait Details: patient unable to coordinate or control movement in any direction, max assist with noted forward propulsion Gait velocity:  decreased  ADL:  Cognition: Cognition Overall Cognitive Status: Difficult to assess Orientation Level: Oriented to person Cognition Arousal/Alertness: Awake/alert Behavior During Therapy: Restless Overall Cognitive Status: Difficult to assess Area of Impairment: Problem solving, Safety/judgement, Awareness, Attention, Following commands Current Attention Level: Sustained Following Commands: Follows one step commands inconsistently, Follows one step commands with increased time Safety/Judgement: Decreased awareness of safety, Decreased awareness of deficits Awareness: Intellectual Problem Solving: Slow processing, Difficulty sequencing, Requires verbal cues, Requires tactile cues, Decreased initiation General Comments: difficult to determine true cognitive impairment vs language barrier despite interpreter use Difficult to assess due to: Non-English speaking  Blood pressure 109/70, pulse 75, temperature 98.2 F (36.8 C), temperature source Oral, resp. rate 18, height 5\' 8"  (1.727 m), weight 61.2 kg, SpO2 99 %. Physical Exam  Vitals reviewed. Constitutional: He appears well-developed and well-nourished.  HENT:  Head: Normocephalic and atraumatic.  Eyes: Right eye exhibits no discharge. Left eye exhibits no discharge.  Disconjugate gaze Injected sclera  Neck: Normal range of motion. Neck supple. No thyromegaly present.  Cardiovascular: Normal rate and regular rhythm.  Respiratory: Effort normal and breath sounds normal. No respiratory distress.  GI: Soft. Bowel sounds are normal. He exhibits distension.  Musculoskeletal:  No edema or tenderness in extremities  Neurological: He is alert.  Patient is alert and a bit anxious.  Exam limited due to language barrier, however there does appear to be some delays in processing Motor: Does not follow any commands, but spontaneously moving all extremities Right facial weakness  Skin: Skin is warm and dry.  Psychiatric:  Unable to  assess due to language, but appears to have some confusion and delays in processing    LabResultsLast24Hours  Results for orders placed or performed during the hospital encounter of 08/10/18 (from the past 24 hour(s))  Basic metabolic panel     Status: Abnormal   Collection Time: 08/15/18  4:11 AM  Result Value Ref Range   Sodium 131 (L) 135 - 145 mmol/L   Potassium 4.1 3.5 - 5.1 mmol/L   Chloride 101 98 - 111 mmol/L   CO2 22 22 - 32 mmol/L   Glucose, Bld 103 (H) 70 - 99 mg/dL   BUN 14 6 - 20 mg/dL   Creatinine, Ser 0.90 0.61 - 1.24 mg/dL   Calcium 8.7 (L) 8.9 - 10.3 mg/dL   GFR calc non Af Amer >60 >60 mL/min   GFR calc Af Amer >60 >60 mL/min   Anion gap 8 5 - 15      ImagingResults(Last48hours)  Ct Head W & Wo Contrast  Result Date: 08/14/2018 CLINICAL DATA:  In cephalopathy. HIV. EXAM: CT HEAD WITHOUT AND WITH CONTRAST TECHNIQUE: Contiguous axial images were obtained from the base of the skull through the vertex without and with intravenous contrast CONTRAST:  87mL OMNIPAQUE IOHEXOL 300 MG/ML  SOLN COMPARISON:  Brain MRI 08/10/2018 FINDINGS: Brain: Extensive edema at the level of the basal ganglia on the left is similar to the prior MRI. There is an 11 mm focus of hemorrhage in the left lentiform nucleus with intrinsic T1 hyperintensity and faint susceptibility artifact present on the prior MRI suggesting that the hemorrhage is not new. Numerous enhancing lesions are again seen throughout both cerebral and cerebellar hemispheres without evidence of substantial interval progression compared to the MRI. The largest lesion is again noted in the left  basal ganglia measuring 2.3 cm. Mild edema associated with a right temporoparietal lesion is unchanged. A 5 mm hyperdense lesion in the posterior left temporal lobe may also be hemorrhagic. There is no evidence of acute large territory infarct. Effacement of the left lateral ventricle due to the vasogenic edema is  unchanged with minimal local rightward midline shift. There is no extra-axial fluid collection. Cerebral volume is normal. Vascular: Grossly patent major dural venous sinuses. Skull: No fracture or focal osseous lesion. Sinuses/Orbits: Visualized paranasal sinuses and mastoid air cells are clear. Unremarkable orbits. Other: None. IMPRESSION: 1. Numerous enhancing brain lesions without evidence of significant progression since the recent MRI consistent with the clinical diagnosis of toxoplasmosis. 2. Unchanged extensive edema in the left basal ganglia with a 11 mm hemorrhagic lesion in the left lentiform nucleus. Electronically Signed   By: Logan Bores M.D.   On: 08/14/2018 16:06     Assessment/Plan: Diagnosis: toxoplasmosis.  Labs and images (see above) independently reviewed.  Records reviewed and summated above.  1. Does the need for close, 24 hr/day medical supervision in concert with the patient's rehab needs make it unreasonable for this patient to be served in a less intensive setting? Yes  2. Co-Morbidities requiring supervision/potential complications: polysubstance abuse (counsel), recently diagnosed HIV (recs per ID), hyponatremia (continue to monitor, treat if necessary) 3. Due to safety, disease management, medication administration and patient education, does the patient require 24 hr/day rehab nursing? Yes 4. Does the patient require coordinated care of a physician, rehab nurse, PT (1-2 hrs/day, 5 days/week), OT (1-2 hrs/day, 5 days/week) and SLP (1-2 hrs/day, 5 days/week) to address physical and functional deficits in the context of the above medical diagnosis(es)? Yes Addressing deficits in the following areas: balance, endurance, locomotion, strength, transferring, bathing, dressing, toileting, cognition, speech, language, swallowing and psychosocial support 5. Can the patient actively participate in an intensive therapy program of at least 3 hrs of therapy per day at least 5 days  per week? Potentially 6. The potential for patient to make measurable gains while on inpatient rehab is excellent 7. Anticipated functional outcomes upon discharge from inpatient rehab are supervision  with PT, supervision with OT, supervision with SLP. 8. Estimated rehab length of stay to reach the above functional goals is: 16-19 days. 9. Anticipated D/C setting: Home 10. Anticipated post D/C treatments: HH therapy and Home excercise program 11. Overall Rehab/Functional Prognosis: excellent and good  RECOMMENDATIONS: This patient's condition is appropriate for continued rehabilitative care in the following setting: Will need to inquire about caregiver support and discharge, if available recommend CIR. Patient has agreed to participate in recommended program. Potentially Note that insurance prior authorization may be required for reimbursement for recommended care.  Comment: Rehab Admissions Coordinator to follow up.   I have personally performed a face to face diagnostic evaluation, including, but not limited to relevant history and physical exam findings, of this patient and developed relevant assessment and plan.  Additionally, I have reviewed and concur with the physician assistant's documentation above.   Delice Lesch, MD, ABPMR Lavon Paganini Angiulli, PA-C 08/15/2018        Revision History                        Routing History

## 2018-08-18 NOTE — Progress Notes (Signed)
INFECTIOUS DISEASE PROGRESS NOTE  ID: Thomas Park [name] is a 27 y.o. male with  Active Problems:   Encephalitis   Hyponatremia   Substance abuse ([place])   Brain lesion   Confusion  Subjective: Resting quietly. Per friend/family still confused.   Abtx:  Anti-infectives (From admission, onward)   Start     Dose/Rate Route Frequency Ordered Stop   08/13/18 2000  atovaquone (MEPRON) 750 MG/5ML suspension 750 mg     750 mg Oral 2 times daily with meals 08/13/18 1154     08/13/18 1230  clindamycin (CLEOCIN) IVPB 900 mg     900 mg 100 mL/hr over 30 Minutes Intravenous Every 8 hours 08/13/18 1154     08/13/18 1230  atovaquone (MEPRON) 750 MG/5ML suspension 750 mg     750 mg Oral NOW 08/13/18 1154 08/13/18 1330   08/10/18 1830  sulfamethoxazole-trimethoprim (BACTRIM) 306.08 mg in dextrose 5 % 250 mL IVPB  Status:  Discontinued     10 mg/kg/day  61.2 kg 269.1 mL/hr over 60 Minutes Intravenous Every 12 hours 08/10/18 1802 08/13/18 1154      Medications:  Scheduled: . atovaquone  750 mg Oral BID WC  . feeding supplement  1 Container Oral TID BM  . lidocaine  1 application Urethral Once  . multivitamins with iron  1 tablet Oral Daily  . thiamine  100 mg Oral Daily    Objective: Vital signs in last 24 hours: Temp:  [97.7 F (36.5 C)-99.2 F (37.3 C)] 98 F (36.7 C) (12/05 0800) Pulse Rate:  [90-101] 90 (12/05 0800) Resp:  [16-18] 16 (12/05 0800) BP: (111-117)/(70-74) 117/74 (12/05 0800) SpO2:  [93 %-100 %] 100 % (12/05 0800)   General appearance: no distress Resp: clear to auscultation bilaterally Cardio: regular rate and rhythm GI: normal findings: bowel sounds normal and soft, non-tender  Lab Results Recent Labs    08/17/18 0410  NA 133*  K 3.7  CL 101  CO2 21*  BUN 12  CREATININE 0.71   Liver Panel No results for input(s): PROT, ALBUMIN, AST, ALT, ALKPHOS, BILITOT, BILIDIR, IBILI in the last 72 hours. Sedimentation Rate No results for input(s):  ESRSEDRATE in the last 72 hours. C-Reactive Protein No results for input(s): CRP in the last 72 hours.  Microbiology: Recent Results (from the past 240 hour(s))  Acid Fast Smear (AFB)     Status: None   Collection Time: 08/11/18 11:20 AM  Result Value Ref Range Status   AFB Specimen Processing Concentration  Final   Acid Fast Smear Negative  Final    Comment: (NOTE) Performed At: [facility] [place], [place] [id number] Rush Farmer MD [id number]    Source (AFB) CSF  Final    Comment: Performed at [facility], [place] [address] [place], [place] [address]  Anaerobic culture     Status: None   Collection Time: 08/11/18 11:20 AM  Result Value Ref Range Status   Specimen Description CSF  Final   Special Requests NONE  Final   Culture   Final    NO ANAEROBES ISOLATED Performed at [facility], [place] [address] [place], [place] [address]    Report Status 08/15/2018 FINAL  Final  CSF culture     Status: None   Collection Time: 08/11/18 11:20 AM  Result Value Ref Range Status   Specimen Description CSF  Final   Special Requests NONE  Final   Gram Stain NO WBC  SEEN NO ORGANISMS SEEN   Final   Culture   Final    NO GROWTH 3 DAYS Performed at [facility], [place] [address], [place], [place] [address]    Report Status 08/14/2018 FINAL  Final  Toxoplasma Gondii, PCR     Status: Abnormal   Collection Time: 08/11/18 11:20 AM  Result Value Ref Range Status   Toxoplasma Gondii, PCR Positive (A) Negative Final    Comment: (NOTE) Toxoplasma gondii DNA Detected. This test was developed and its performance characteristics determined by [employer]. It has not been cleared or approved by the U.S. Food and Drug Administration. The FDA has determined that such clearance or approval is not necessary. This test is used for clinical purposes. It should not be regarded as investigational or research. Performed At: [facility] [address] [place], [place] [id number] Rush Farmer MD [id number]   Culture, blood (Routine X 2) w Reflex to ID Panel     Status: None   Collection Time: 08/12/18 11:12 AM  Result Value Ref Range Status   Specimen Description BLOOD RIGHT ANTECUBITAL  Final   Special Requests   Final    BOTTLES DRAWN AEROBIC AND ANAEROBIC Blood Culture adequate volume   Culture   Final    NO GROWTH 5 DAYS Performed at [facility], [address], [place], [place] [address]    Report Status 08/17/2018 FINAL  Final  Culture, blood (Routine X 2) w Reflex to ID Panel     Status: None   Collection Time: 08/12/18 11:19 AM  Result Value Ref Range Status   Specimen Description BLOOD LEFT ANTECUBITAL  Final   Special Requests   Final    BOTTLES DRAWN AEROBIC AND ANAEROBIC Blood Culture adequate volume   Culture   Final    NO GROWTH 5 DAYS Performed at [facility], [place] [address], [place], [place] [address]    Report Status 08/17/2018 FINAL  Final    Studies/Results: No results found.   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  Consider repeat CT head next 48h Hold on ART til ~ 12-11         Bobby Rumpf MD, FACP Infectious Diseases (pager) [phone] [email or web address] 08/18/2018, 8:42 AM  LOS: 8 days

## 2018-08-18 NOTE — H&P (Addendum)
Physical Medicine and Rehabilitation Admission H&P        Chief Complaint  Patient presents with  . Weakness  : HPI: Thomas Park is a 27 year old right handed non-English-speaking male history of polysubstance abuse recent diagnosed HIV. Per chart review, patient lives with sister and independent prior to admission working full-time as a Systems developer. Family planning assistance as needed on discharge. Presented 08/10/2018 with altered mental status as well as unintentional weight loss over the past 3 months. MRI of the brain revealed numerous masses. 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 Mild to moderate vasogenic edema with regional mass effect greatest at the left basal ganglia. Echocardiogram with ejection fraction of 65% no wall motion abnormalities. CD4 T-cell less than 10. 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 Tolerating a regular diet. Ophthalmology follow-up for peripheral vision loss as well as both eyes deviating towards the left lateral side felt to be secondary to intracranial lesions and currently advised to continue to monitor. Noted bouts of urinary retention with latest bladder scan 536 mL requiring Coude catheter and plan voiding trial. Therapy evaluations completed with recommendations of physical medicine rehabilitation consult. Patient was admitted for a comprehensive rehabilitation program.   Review of Systems  Unable to perform ROS: Language        Past Medical History:  Diagnosis Date  . Anemia      [name] 08/10/2018  . HIV (human immunodeficiency virus infection) ([place])      dx'd 07/2018  . 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 NO PAST SURGERIES        History reviewed. No pertinent  family history. Social History:  reports that he has never smoked. He has never used smokeless tobacco. He reports that he drinks about 6.0 standard drinks of alcohol per week. He reports that he has current or past drug history. Allergies: No Known Allergies       Medications Prior to Admission  Medication Sig Dispense Refill  . fluconazole (DIFLUCAN) 200 MG tablet Take 200 mg by mouth daily.   0  . ibuprofen (ADVIL,MOTRIN) 200 MG tablet Take 400 mg by mouth every 6 (six) hours as needed for fever or headache.      [name] Kitchen OVER THE COUNTER MEDICATION Apply 2 drops to eye daily as needed (for red eyes, itchy).          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 Once sitting on EOB pt tried to lay in supine. Transfers Overall transfer level: Needs assistance Equipment used: 2 person hand held assist Transfers: Sit to/from Stand  Sit to Stand: Min assist, +2 physical assistance General transfer comment: impulsive wtih mobility, cues for hand placement to push from seated surface.  Min Assist + 2 to steady once in standing Ambulation/Gait Ambulation/Gait assistance: +2  safety/equipment, +2 physical assistance, Mod assist Gait Distance (Feet): 50 Feet Assistive device: 2 person hand held assist Gait Pattern/deviations: Step-to pattern, Decreased stride length, Staggering right, Staggering left, Narrow base of support General Gait Details: patient unable to coordinate or control movement in any direction. In room pt leaning L. Max assist +2 with ambulation due to undsteadiness.   Gait velocity: decreased   ADL: ADL Overall ADL's : Needs assistance/impaired Grooming: Moderate assistance Grooming Details (indicate cue type and reason): difficulty sequencing task; perseverating; attmepitng to put toothpaste on wrong side of toothbrush Upper Body Bathing: Moderate assistance, Sitting Lower Body Bathing: Moderate assistance, Sit to/from stand Upper Body Dressing : Moderate assistance, Sitting Lower Body Dressing: Maximal assistance, Sit to/from stand Toilet Transfer: Moderate assistance, Ambulation Toileting- Clothing Manipulation and Hygiene: Moderate assistance, Sit to/from stand Functional mobility during ADLs: Moderate assistance, +2 for safety/equipment   Cognition: Cognition Overall Cognitive Status: Impaired/Different from baseline Orientation Level: Oriented to person Cognition Arousal/Alertness: Awake/alert Behavior During Therapy: Restless, Impulsive Overall Cognitive Status: Impaired/Different from baseline Area of Impairment: Problem solving, Safety/judgement, Awareness, Attention, Following commands Current Attention Level: Focused Following Commands: Follows one step commands inconsistently, Follows one step commands with increased time Safety/Judgement: Decreased awareness of safety, Decreased awareness of deficits Awareness: Intellectual Problem Solving: Slow processing, Difficulty sequencing, Requires verbal cues, Requires tactile cues, Decreased initiation General Comments: Pt would respond to the interpreter with "I'm fine,everything  is fine" when asked to do a task. Difficult to assess due to: Non-English speaking   Physical Exam: Blood pressure 123/75, pulse (!) 103, temperature 97.8 F (36.6 C), temperature source Oral, resp. rate 18, height 5\' 8"  (1.727 m), weight 61.2 kg, SpO2 97 %. Physical Exam  HENT:  Head: Normocephalic.  Eyes:  Both eyes deviated to the left. Pupils reactive to light  Neck: Normal range of motion. Neck supple. No thyromegaly present.  Cardiovascular: Normal rate and regular rhythm.  Respiratory: Effort normal and breath sounds normal. No respiratory distress.  GI: Soft. Bowel sounds are normal. He exhibits no distension.  Neurological:  Patient is alert and a bit anxious. Family member at bedside. He did follow some simple demonstrated commands. Exam overall limited due to language barrier.  pt has difficulty with MMT even with hand over hand assist due to poor attention  Ext neg C/C/E Lab Results Last 48 Hours        Results for orders placed or performed during the hospital encounter of 08/10/18 (from the past 48 hour(s))  Basic metabolic panel     Status: Abnormal    Collection Time: 08/17/18  4:10 AM  Result Value Ref Range    Sodium 133 (L) 135 - 145 mmol/L    Potassium 3.7 3.5 - 5.1 mmol/L    Chloride 101 98 - 111 mmol/L    CO2 21 (L) 22 - 32 mmol/L    Glucose, Bld 96 70 - 99 mg/dL    BUN 12 6 - 20 mg/dL    Creatinine, Ser 0.71 0.61 - 1.24 mg/dL    Calcium 8.7 (L) 8.9 - 10.3 mg/dL    GFR calc non Af Amer >60 >60 mL/min    GFR calc Af Amer >60 >60 mL/min    Anion gap 11 5 - 15  Comment: Performed at [facility], [place] [address], [place], [place] [address]      Imaging Results (Last 48 hours)  No results found.           Medical Problem List and Plan: 1.  Decreased functional mobility with cognitive deficits secondary to acute encephalopathy related toxoplasmosis in the setting of immunosuppression due to recently diagnosed HIV. Follow-up per infectious  disease. Currently completing a course of clindamycin and Mepron 2.  DVT Prophylaxis/Anticoagulation: SCDs 3. Pain Management:  Tylenol as needed 4. Mood:  Provide emotional support 5. Neuropsych: This patient is not capable of making decisions on his own behalf. 6. Skin/Wound Care:  Routine skin checks 7. Fluids/Electrolytes/Nutrition: Routine in and out with follow-up chemistries 8.HIV. Follow-up per infectious disease and await plan to begin anti-retroviral medications 9. Urinary retention. Discontinue Foley tube and begin voiding trial 10. History of polysubstance abuse. Urine drug screen negative   Post Admission Physician Evaluation: 1. Functional deficits secondary  to CNS Toxoplasmosis. 2. Patient admitted to receive collaborative, interdisciplinary care between the physiatrist, rehab nursing staff, and therapy team. 3. Patient's level of medical complexity and substantial therapy needs in context of that medical necessity cannot be provided at a lesser intensity of care. 4. Patient has experienced substantial functional loss from his/her baseline. Judging by the patient's diagnosis, physical exam, and functional history, the patient has potential for functional progress which will result in measurable gains while on inpatient rehab.  These gains will be of substantial and practical use upon discharge in facilitating mobility and self-care at the household level. 5. Physiatrist will provide 24 hour management of medical needs as well as oversight of the therapy plan/treatment and provide guidance as appropriate regarding the interaction of the two. 6. 24 hour rehab nursing will assist in the management of  bladder management, bowel management, safety, skin/wound care, disease management, medication administration, pain management and patient education  and help integrate therapy concepts, techniques,education, etc. 7. PT will assess and treat for:pre gait, gait training, endurance , safety,  equipment, neuromuscular re education  .  Goals are: moderate assist. 8. OT will assess and treat for ADLs, Cognitive perceptual skills, Neuromuscular re education, safety, endurance, equipment  .  Goals are: moderate assist.  9. SLP will assess and treat for attention, concentration, orientation, memory  .  Goals are: moderate assist. 10. Case Management and Social Worker will assess and treat for psychological issues and discharge planning. 11. Team conference will be held weekly to assess progress toward goals and to determine barriers to discharge. 12.  Patient will receive at least 3 hours of therapy per day at least 5 days per week. 13. ELOS and Prognosis: 16-19d poor  "I have personally performed a face to face diagnostic evaluation of this patient.  Additionally, I have reviewed and concur with the physician assistant's documentation above."   Charlett Blake M.D. [facility] FAAPM&R (Sports Med, Neuromuscular Med) Diplomate Am Board of Electrodiagnostic Med  Cathlyn Parsons, PA-C 08/18/2018

## 2018-08-18 NOTE — Progress Notes (Signed)
SLP Cancellation Note  Patient Details Name: Thomas Park MRN: [id number] DOB: [birth year]   Cancelled treatment:       Reason Eval/Treat Not Completed: Other (comment). Given plan for admission to CIR will defer cognitive eval to that venue.    Matthias Bogus, Katherene Ponto 08/18/2018, 2:15 PM

## 2018-08-18 NOTE — Progress Notes (Signed)
Thomas Arn, MD  Physician  Physical Medicine and Rehabilitation  Consult Note  Signed  Date of Service:  08/15/2018 7:29 AM       Related encounter: ED to Hosp-Admission (Discharged) from 08/10/2018 in [facility]      Signed      Expand All Collapse All    Show:Clear all [x] Manual[x] Template[] Copied  Added by: [x] Angiulli, Lavon Paganini, PA-C[x] Thomas Arn, MD  [] Hover for details      Physical Medicine and Rehabilitation Consult Reason for Consult:  Decreased functional mobility Referring Physician:   [facility]   HPI: Thomas Park is a 27 y.o.right handed non-English-speaking male  with history of polysubstance abuse recently diagnosed HIV. 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 Presented 08/10/2018 with altered mental status as well as unintentional weight loss over the past 3 months. MRI of the brain reviewed with numerous masses.  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 Mild to moderate vasogenic edema with regional mass effect greatest at the left basal ganglia. CD4 T-cell less than 10. 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 Therapy evaluation completed with recommendations of physical medicine rehabilitation consult.  Review of Systems  Unable to perform ROS: Language       Past Medical History:  Diagnosis Date  . Anemia    [name] 08/10/2018  . HIV (human immunodeficiency virus infection) ([place])    dx'd 07/2018  . 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 NO PAST SURGERIES     History reviewed. No pertinent family history.,  Unable to obtain  from patient Social History:  reports that he has never smoked. He has never used smokeless tobacco. He reports that he drinks about 6.0 standard drinks of alcohol per week. He reports that he has current or past drug history. Allergies: No Known Allergies       Medications Prior to Admission  Medication Sig Dispense Refill  . fluconazole (DIFLUCAN) 200 MG tablet Take 200 mg by mouth daily.  0  . ibuprofen (ADVIL,MOTRIN) 200 MG tablet Take 400 mg by mouth every 6 (six) hours as needed for fever or headache.    [name] Kitchen OVER THE COUNTER MEDICATION Apply 2 drops to eye daily as needed (for red eyes, itchy).      Home: Home Living Family/patient expects to be discharged to:: Private residence Living Arrangements: Other relatives Available Help at Discharge: Family(lives with sister) Type of Home: House Home Access: Level entry Home Layout: One level Home Equipment: None  Functional History: Prior Function Level of Independence: Independent Comments: was working every day prior to this admission Functional Status:  Mobility: Bed Mobility Overal bed mobility: Needs Assistance Bed Mobility: Supine to Sit, Sit to Supine Supine to sit: Min assist Sit to supine: Min assist General bed mobility comments: min assist for safety, patient moving impulsively  Transfers Overall transfer level: Needs assistance Equipment used: 1 person hand held assist Transfers: Sit to/from Stand Sit to Stand: Min assist General transfer comment: min assist for stability once powered up Ambulation/Gait Ambulation/Gait assistance: Max assist Gait Distance (Feet): 4 Feet Assistive device: 1 person hand held assist Gait Pattern/deviations: Ataxic, Step-to pattern, Decreased stride length, Staggering  right, Staggering left General Gait Details: patient unable to coordinate or control movement in any direction, max assist with noted forward propulsion Gait velocity:  decreased  ADL:  Cognition: Cognition Overall Cognitive Status: Difficult to assess Orientation Level: Oriented to person Cognition Arousal/Alertness: Awake/alert Behavior During Therapy: Restless Overall Cognitive Status: Difficult to assess Area of Impairment: Problem solving, Safety/judgement, Awareness, Attention, Following commands Current Attention Level: Sustained Following Commands: Follows one step commands inconsistently, Follows one step commands with increased time Safety/Judgement: Decreased awareness of safety, Decreased awareness of deficits Awareness: Intellectual Problem Solving: Slow processing, Difficulty sequencing, Requires verbal cues, Requires tactile cues, Decreased initiation General Comments: difficult to determine true cognitive impairment vs language barrier despite interpreter use Difficult to assess due to: Non-English speaking  Blood pressure 109/70, pulse 75, temperature 98.2 F (36.8 C), temperature source Oral, resp. rate 18, height 5\' 8"  (1.727 m), weight 61.2 kg, SpO2 99 %. Physical Exam  Vitals reviewed. Constitutional: He appears well-developed and well-nourished.  HENT:  Head: Normocephalic and atraumatic.  Eyes: Right eye exhibits no discharge. Left eye exhibits no discharge.  Disconjugate gaze Injected sclera  Neck: Normal range of motion. Neck supple. No thyromegaly present.  Cardiovascular: Normal rate and regular rhythm.  Respiratory: Effort normal and breath sounds normal. No respiratory distress.  GI: Soft. Bowel sounds are normal. He exhibits distension.  Musculoskeletal:  No edema or tenderness in extremities  Neurological: He is alert.  Patient is alert and a bit anxious.  Exam limited due to language barrier, however there does appear to be some delays in processing Motor: Does not follow any commands, but spontaneously moving all extremities Right facial weakness  Skin: Skin is warm and dry.  Psychiatric:  Unable to  assess due to language, but appears to have some confusion and delays in processing    LabResultsLast24Hours  Results for orders placed or performed during the hospital encounter of 08/10/18 (from the past 24 hour(s))  Basic metabolic panel     Status: Abnormal   Collection Time: 08/15/18  4:11 AM  Result Value Ref Range   Sodium 131 (L) 135 - 145 mmol/L   Potassium 4.1 3.5 - 5.1 mmol/L   Chloride 101 98 - 111 mmol/L   CO2 22 22 - 32 mmol/L   Glucose, Bld 103 (H) 70 - 99 mg/dL   BUN 14 6 - 20 mg/dL   Creatinine, Ser 0.90 0.61 - 1.24 mg/dL   Calcium 8.7 (L) 8.9 - 10.3 mg/dL   GFR calc non Af Amer >60 >60 mL/min   GFR calc Af Amer >60 >60 mL/min   Anion gap 8 5 - 15      ImagingResults(Last48hours)  Ct Head W & Wo Contrast  Result Date: 08/14/2018 CLINICAL DATA:  In cephalopathy. HIV. EXAM: CT HEAD WITHOUT AND WITH CONTRAST TECHNIQUE: Contiguous axial images were obtained from the base of the skull through the vertex without and with intravenous contrast CONTRAST:  29mL OMNIPAQUE IOHEXOL 300 MG/ML  SOLN COMPARISON:  Brain MRI 08/10/2018 FINDINGS: Brain: Extensive edema at the level of the basal ganglia on the left is similar to the prior MRI. There is an 11 mm focus of hemorrhage in the left lentiform nucleus with intrinsic T1 hyperintensity and faint susceptibility artifact present on the prior MRI suggesting that the hemorrhage is not new. Numerous enhancing lesions are again seen throughout both cerebral and cerebellar hemispheres without evidence of substantial interval progression compared to the MRI. The largest lesion is again noted in the left  basal ganglia measuring 2.3 cm. Mild edema associated with a right temporoparietal lesion is unchanged. A 5 mm hyperdense lesion in the posterior left temporal lobe may also be hemorrhagic. There is no evidence of acute large territory infarct. Effacement of the left lateral ventricle due to the vasogenic edema is  unchanged with minimal local rightward midline shift. There is no extra-axial fluid collection. Cerebral volume is normal. Vascular: Grossly patent major dural venous sinuses. Skull: No fracture or focal osseous lesion. Sinuses/Orbits: Visualized paranasal sinuses and mastoid air cells are clear. Unremarkable orbits. Other: None. IMPRESSION: 1. Numerous enhancing brain lesions without evidence of significant progression since the recent MRI consistent with the clinical diagnosis of toxoplasmosis. 2. Unchanged extensive edema in the left basal ganglia with a 11 mm hemorrhagic lesion in the left lentiform nucleus. Electronically Signed   By: Logan Bores M.D.   On: 08/14/2018 16:06     Assessment/Plan: Diagnosis: toxoplasmosis.  Labs and images (see above) independently reviewed.  Records reviewed and summated above.  1. Does the need for close, 24 hr/day medical supervision in concert with the patient's rehab needs make it unreasonable for this patient to be served in a less intensive setting? Yes  2. Co-Morbidities requiring supervision/potential complications: polysubstance abuse (counsel), recently diagnosed HIV (recs per ID), hyponatremia (continue to monitor, treat if necessary) 3. Due to safety, disease management, medication administration and patient education, does the patient require 24 hr/day rehab nursing? Yes 4. Does the patient require coordinated care of a physician, rehab nurse, PT (1-2 hrs/day, 5 days/week), OT (1-2 hrs/day, 5 days/week) and SLP (1-2 hrs/day, 5 days/week) to address physical and functional deficits in the context of the above medical diagnosis(es)? Yes Addressing deficits in the following areas: balance, endurance, locomotion, strength, transferring, bathing, dressing, toileting, cognition, speech, language, swallowing and psychosocial support 5. Can the patient actively participate in an intensive therapy program of at least 3 hrs of therapy per day at least 5 days  per week? Potentially 6. The potential for patient to make measurable gains while on inpatient rehab is excellent 7. Anticipated functional outcomes upon discharge from inpatient rehab are supervision  with PT, supervision with OT, supervision with SLP. 8. Estimated rehab length of stay to reach the above functional goals is: 16-19 days. 9. Anticipated D/C setting: Home 10. Anticipated post D/C treatments: HH therapy and Home excercise program 11. Overall Rehab/Functional Prognosis: excellent and good  RECOMMENDATIONS: This patient's condition is appropriate for continued rehabilitative care in the following setting: Will need to inquire about caregiver support and discharge, if available recommend CIR. Patient has agreed to participate in recommended program. Potentially Note that insurance prior authorization may be required for reimbursement for recommended care.  Comment: Rehab Admissions Coordinator to follow up.   I have personally performed a face to face diagnostic evaluation, including, but not limited to relevant history and physical exam findings, of this patient and developed relevant assessment and plan.  Additionally, I have reviewed and concur with the physician assistant's documentation above.   Delice Lesch, MD, ABPMR Lavon Paganini Angiulli, PA-C 08/15/2018        Revision History                        Routing History

## 2018-08-18 NOTE — PMR Pre-admission (Signed)
PMR Admission Coordinator Pre-Admission Assessment  Patient: Thomas Park is an 27 y.o., male MRN: [id number] DOB: 12-11-1990 Height: 5\' 8"  (172.7 cm)(estimated height) Weight: 61.2 kg(estimated)              Insurance Information  PRIMARY: uninsured/undocumented       Medicaid Application Date:       Case Manager:  Disability Application Date:       Case Worker:   Emergency Facilities manager Information    Name Relation Home Work [place], [place] Sister   [phone]   Eather Colas Sister   [phone]     Current Medical History  Patient Admitting Diagnosis: toxoplasmosis  History of Present Illness: Thomas Park is a 27 year old right handed non-English-speaking male history of polysubstance abuse recent diagnosed HIV. Per chart review, patient lives with sister and independent prior to admission working full-time as a Systems developer.  Presented 08/10/2018 with altered mental status as well as unintentional weight loss over the past 3 months. MRI of the brain revealed numerous masses.Per report numerous ring-enhancing lesions greatest at the left basal ganglia classically representing toxoplasmosis. Mild to moderate vasogenic edema with regional mass effect greatest at the left basal ganglia. Echocardiogram with ejection fraction of 65% no wall motion abnormalities. CD4 T-cell less than 10. 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 Tolerating a regular diet. Ophthalmology follow-up for peripheral vision loss as well as both eyes deviating towards the left lateral side felt to be secondary to intracranial lesions and currently advised to continue to monitor. Noted bouts of urinary retention with latest bladder scan 536 mL requiring Coude catheter and plan voiding  Tolerating a regular diet. Ophthalmology follow-up for  peripheral vision loss as well as both eyes deviating towards the left lateral side felt to be secondary to intracranial lesions and currently advised to continue to monitor. Noted bouts of urinary retention with latest bladder scan 536 mL requiring Coude catheter and plan voiding trial.   Complete NIHSS TOTAL: 4    Past Medical History  Past Medical History:  Diagnosis Date  . Anemia    [name] 08/10/2018  . HIV (human immunodeficiency virus infection) ([place])    dx'd 07/2018  . 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  Prior Rehab/Hospitalizations:  Has the patient had major surgery during 100 days prior to admission? No  Current Medications   Current Facility-Administered Medications:  .  0.9 %  sodium chloride infusion, , Intravenous, PRN, Shelly Coss, MD, Last Rate: 10 mL/hr at 08/18/18 0600 .  acetaminophen (TYLENOL) tablet 650 mg, 650 mg, Oral, Q6H PRN, 650 mg at 08/13/18 1659 **OR** acetaminophen (TYLENOL) suppository 650 mg, 650 mg, Rectal, Q6H PRN, Arrien, Jimmy Picket, MD .  atovaquone [facility]) 750 MG/5ML suspension 750 mg, 750 mg, Oral, BID WC, Adhikari, Amrit, MD, 750 mg at 08/18/18 0929 .  clindamycin (CLEOCIN) IVPB 900 mg, 900 mg, Intravenous, Q8H, Arrien, Jimmy Picket, MD, Last Rate: 100 mL/hr at 08/18/18 1255, 900 mg at 08/18/18 1255 .  feeding supplement (BOOST / RESOURCE BREEZE) liquid 1 Container, 1 Container, Oral, TID BM, Arrien, Jimmy Picket, MD, 1 Container at 08/18/18 0932 .  lidocaine (XYLOCAINE) 2 % jelly 1 application, 1 application, Urethral, Once, Schorr, Rhetta Mura, NP .  multivitamins with iron tablet 1 tablet, 1 tablet,  Oral, Daily, Arrien, Jimmy Picket, MD, 1 tablet at 08/18/18 [id number] .  [START ON 08/19/2018] NONFORMULARY OR COMPOUNDED ITEM 1 capsule, 1 capsule, Oral, q morning - 10a **AND** [START ON 08/19/2018] NONFORMULARY OR COMPOUNDED ITEM 1 capsule, 1 capsule, Oral, q morning - 10a,  Hatcher, Doroteo Bradford, MD .  ondansetron (ZOFRAN) tablet 4 mg, 4 mg, Oral, Q6H PRN **OR** ondansetron (ZOFRAN) injection 4 mg, 4 mg, Intravenous, Q6H PRN, Arrien, Jimmy Picket, MD, 4 mg at 08/12/18 4315 .  polyvinyl alcohol (LIQUIFILM TEARS) 1.4 % ophthalmic solution 1 drop, 1 drop, Both Eyes, PRN, Schorr, Rhetta Mura, NP .  thiamine (VITAMIN B-1) tablet 100 mg, 100 mg, Oral, Daily, Arrien, Jimmy Picket, MD, 100 mg at 08/18/18 4008  Patients Current Diet:  Diet Order            Diet general        Diet regular Room service appropriate? 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  Independent  Indoor Mobility: Did the patient need assistance with walking from room to room (with or without device)? Independent  Stairs: Did the patient need assistance with internal or external stairs (with or without device)? Independent  Functional Cognition: Did the patient need help planning regular tasks such as shopping or remembering to take medications? Independent  Current Functional Level Cognition  Overall Cognitive Status: Impaired/Different from baseline Difficult to assess due  to: Non-English speaking Current Attention Level: Focused Orientation Level: Oriented to person Following Commands: Follows one step commands inconsistently, Follows one step commands with increased time Safety/Judgement: Decreased awareness of safety, Decreased awareness of deficits General Comments: Pt would respond to the interpreter with "I'm fine,everything is fine" when asked to do a task.    Extremity Assessment (includes Sensation/Coordination)  Upper Extremity Assessment: Generalized weakness, RUE deficits/detail, LUE deficits/detail RUE Deficits / Details: decreased coordination; using functionally but dropping toothbrush LUE Deficits / Details: using funcitonally but difficulty coordinating movement together  Lower Extremity Assessment: Defer to PT evaluation    ADLs  Overall ADL's : Needs assistance/impaired Grooming: Moderate assistance Grooming Details (indicate cue type and reason): difficulty sequencing task; perseverating; attmepitng to put toothpaste on wrong side of toothbrush Upper Body Bathing: Moderate assistance, Sitting Lower Body Bathing: Moderate assistance, Sit to/from stand Upper Body Dressing : Moderate assistance, Sitting Lower Body Dressing: Maximal assistance, Sit to/from stand Toilet Transfer: Moderate assistance, Ambulation Toileting- Clothing Manipulation and Hygiene: Moderate assistance, Sit to/from stand Functional mobility during ADLs: Moderate assistance, +2 for safety/equipment    Mobility  Overal bed mobility: Needs Assistance Bed Mobility: Supine to Sit Supine to sit: Min assist Sit to supine: Min assist General bed mobility comments: min assist for safety, patient moving impulsively.  Once sitting on EOB pt tried to lay in supine.    Transfers  Overall transfer level: Needs assistance Equipment used: 2 person hand held assist Transfers: Sit to/from Stand Sit to Stand: Min assist, +2 physical assistance General transfer comment: impulsive  wtih mobility, cues for hand placement to push from seated surface.  Min Assist + 2 to steady once in standing    Ambulation / Gait / Stairs / Wheelchair Mobility  Ambulation/Gait Ambulation/Gait assistance: +2 safety/equipment, +2 physical assistance, Mod assist Gait Distance (Feet): 50 Feet Assistive device: 2 person hand held assist Gait Pattern/deviations: Step-to pattern, Decreased stride length, Staggering right, Staggering left, Narrow base of support General Gait Details: patient unable to coordinate or control movement in any direction. In room pt leaning L. Max assist +2 with ambulation due to undsteadiness.   Gait velocity: decreased    Posture / Balance Dynamic Sitting Balance Sitting balance - Comments: able to sit with min guard at times.  Balance Overall balance assessment: Needs assistance Sitting balance-Leahy Scale: Fair Sitting balance - Comments: able to sit with min guard at times.  Standing balance-Leahy Scale: Poor Standing balance comment: reliant on external support    Special needs/care consideration BiPAP/CPAP n/a CPM n/a Continuous Drip IV n/a Dialysis n/a Life Vest n/a Oxygen n/a Special Bed n/a Trach Size n/a Wound Vac n/a Skin intact Bowel mgmt: continent LBM 12/4 Bladder mgmt: indwelling catheter placed 12/3 due to retention Diabetic mgmt n/a Family and pt non English speaking; interpretor needed       Id clinic an pharmacy to follow up after d/c and provide meds for HIV and toxo. Oral toxo meds pending arrival hopefully today 12/5. Currently on IV clindamycin until po meds available.  Previous Home Environment Living Arrangements: (lives with isster, Dora, pta)  Lives With: Family Available Help at Discharge: Family, Available 24 hours/day(family can provide 24/7 supervision at d/c) Type of Home: House Home Layout: One level Home Access: Level entry Bathroom Shower/Tub: Optometrist:  No Home Care Services: No  Discharge Living Setting Plans for Discharge Living Setting: Lives with (comment)(sister, Dora) Type of Home at Discharge: House Discharge Home Layout: One level Discharge Home Access: Level entry Discharge Bathroom Shower/Tub: Tub/shower unit Discharge Bathroom Toilet: Standard Discharge Bathroom Accessibility: Yes Does the patient have any problems obtaining your medications?: Yes (Describe)(uninsured)  Social/Family/Support Systems Patient Roles: (employee, 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 Information: Dora, sister he lives with  Anticipated Caregiver: siblings and Dad Anticipated Caregiver's Contact Information: [name], [phone] Ability/Limitations of Caregiver: family work but rotate schedule Caregiver Availability: 24/7 Discharge Plan Discussed with Primary Caregiver: Yes Is Caregiver In Agreement with Plan?: Yes Does Caregiver/Family have Issues with Lodging/Transportation while Pt is in Rehab?: No   Goals/Additional Needs Patient/Family Goal for Rehab: supervision PT, OT, and SLP Expected length of stay: ELOS 10 - 14 days Cultural Considerations: Spanish speaker Special Service Needs: spanish interpretor needed; family non english speaking Pt/Family Agrees to Admission and willing to participate: Yes Program Orientation Provided & Reviewed with Pt/Caregiver Including Roles  & Responsibilities: Yes  Barriers to Discharge: Insurance for SNF coverage(uninusred undocumented; new DX this admission)   Decrease burden of Care through IP rehab admission: n/a  Possible need for SNF placement upon discharge:no anticipated  Patient Condition: This patient's medical and functional status has changed since the consult dated: 08/15/2018 in which the Rehabilitation Physician determined and documented that the patient's condition is appropriate for intensive rehabilitative care in an inpatient rehabilitation facility. See "History  of Present Illness" (above) for medical  update. Functional changes are: overall min to mod assist. Patient's medical and functional status update has been discussed with the Rehabilitation physician and patient remains appropriate for inpatient rehabilitation. Will admit to inpatient rehab today.  Preadmission Screen Completed By:  Cleatrice Burke, 08/18/2018 2:36 PM ______________________________________________________________________   Discussed status with Dr. Letta Pate on 08/18/2018 at 1444 and received telephone approval for admission today.  Admission Coordinator:  Cleatrice Burke, time 6394 Date 12/5/ 2019

## 2018-08-18 NOTE — Evaluation (Signed)
Occupational Therapy Assessment and Plan  Patient Details  Name: Jalyn Rosero MRN: [id number] Date of Birth: [birth year]  OT Diagnosis: cognitive deficits, disturbance of vision, hemiplegia affecting non-dominant side and muscle weakness (generalized) Rehab Potential: Rehab Potential (ACUTE ONLY): Good ELOS: 21-24 days   Today's Date: 08/19/2018 OT Individual Time: 5732-2025 OT Individual Time Calculation (min): 75 min     Problem List:  Patient Active Problem List   Diagnosis Date Noted  . Neurologic gait disorder   . Brain lesion   . Confusion   . Hyponatremia   . Substance abuse ([place])   . Cerebritis 08/10/2018  . Symptomatic HIV infection ([place])   . 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 Anemia    [name] 08/10/2018  . HIV (human immunodeficiency virus infection) ([name])    dx'd 07/2018  . 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 NO PAST SURGERIES      Assessment & Plan Clinical Impression: Patient is a 27 y.o. year old male right handed non-English-speaking male  with history of polysubstance abuse recently diagnosed HIV. 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 Presented 08/10/2018 with altered mental status as well as unintentional weight loss over the past 3 months. MRI of the brain reviewed with numerous masses.   Patient transferred to CIR on 08/18/2018 .    Patient currently requires max with basic self-care skills secondary to muscle weakness, impaired timing and sequencing, decreased coordination and decreased motor planning, decreased visual perceptual skills and decreased visual motor skills, decreased midline orientation, decreased attention to right, right side neglect, decreased motor planning and ideational apraxia,  decreased initiation, decreased attention, decreased awareness, decreased problem solving, decreased safety awareness, decreased memory and delayed processing and decreased sitting balance, decreased standing balance, decreased postural control, hemiplegia and decreased balance strategies.  Prior to hospitalization, patient could complete BADL with independent .  Patient will benefit from skilled intervention to decrease level of assist with basic self-care skills and increase independence with basic self-care skills prior to discharge home with care partner.  Anticipate patient will require 24 hour supervision and follow up home health.  OT - End of Session Endurance Deficit: Yes Endurance Deficit Description: Rest breaks within BADL tasks OT Assessment Rehab Potential (ACUTE ONLY): Good OT Patient demonstrates impairments in the following area(s): Balance;Behavior;Cognition;Endurance;Motor;Perception;Safety;Sensory;Vision OT Basic ADL's Functional Problem(s): Eating;Grooming;Bathing;Dressing;Toileting OT Transfers Functional Problem(s): Tub/Shower;Toilet OT Additional Impairment(s): Fuctional Use of Upper Extremity OT Plan OT Intensity: Minimum of 1-2 x/day, 45 to 90 minutes OT Frequency: 5 out of 7 days OT Duration/Estimated Length of Stay: 21-24 days OT Treatment/Interventions: Balance/vestibular training;Cognitive remediation/compensation;Community reintegration;Discharge planning;DME/adaptive equipment instruction;Functional mobility training;Neuromuscular re-education;Patient/family education;Psychosocial support;Self Care/advanced ADL retraining;Therapeutic Activities;Therapeutic Exercise;UE/LE Strength taining/ROM;UE/LE Coordination activities;Visual/perceptual remediation/compensation;Wheelchair propulsion/positioning OT Self Feeding Anticipated Outcome(s): Supervision OT Basic Self-Care Anticipated Outcome(s): Supervision OT Toileting Anticipated Outcome(s): Supervision OT Bathroom  Transfers Anticipated Outcome(s): Supervision OT Recommendation Patient destination: Home Follow Up Recommendations: Home health OT Equipment Recommended: To be determined   Skilled Therapeutic Intervention Pt greeted seated in wc with spanish interpreter and father present. Educated pt and family on OT purpose and POC.  Propelled pt to bathroom and completed stand-pivot to shower bench with mod A and max verbal and tactile cues to locate shower bench on R side  2/2 severe L gaze preference. Pt able to follow 50%- 75% of commands to wash body parts. Utilized water on R side to cue pt to lean to the [employer] visual scanning in all BADL tasks to try to get pt to look towards midline. Dressing completed with mod A, set-up A and increased time to problem solve with vision deficits. Toothbrushing task completed at the sink with hand over hand A to locate water, toothbrush, and toothpaste. Pt then knew what to do with toothbrush and appropriately brush teeth. Physical assistance to bring head towards midline to spit in the sink. Worked on visual scanning with dynavision activity. Physical assist for head positioning to try to locate light buttons on L side. With max multimodal cues, pt able to locate 4 light buttons on the far L side, but unable to locate close to midline. Pt requested to stay in wc and was left with father present and safety belt on.   OT Evaluation Precautions/Restrictions  Precautions Precautions: Fall Restrictions Weight Bearing Restrictions: No General   Vital Signs Therapy Vitals Pulse Rate: 96 BP: 125/86 Patient Position (if appropriate): Sitting Pain Pain Assessment Faces Pain Scale: No hurt Home Living/Prior Functioning Home Living Family/patient expects to be discharged to:: Unsure Living Arrangements: Other relatives Available Help at Discharge: Family, Available 24 hours/day Type of Home: House Home Access: Stairs to enter [employer] of  Steps: 2 Home Layout: One level Bathroom Shower/Tub: Optometrist: No  Lives With: Family Prior Function Level of Independence: Independent with basic ADLs, Independent with homemaking with ambulation  Able to Take Stairs?: Yes Driving: No Vocation: Full time employment Comments: 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 ADL   Vision Baseline Vision/History: No visual deficits Vision Assessment?: Vision impaired- to be further tested in functional context;Yes Eye Alignment: Impaired (comment) Ocular Range of Motion: Restricted on the right;Restricted looking up;Restricted looking down;Impaired-to be further tested in functional context Alignment/Gaze Preference: Gaze left;Head turned Tracking/Visual Pursuits: Impaired - to be further tested in functional context;Right eye does not track medially;Left eye does not track medially;Right eye does not track laterally;Left eye does not track laterally Saccades: Impaired - to be further tested in functional context Convergence: Impaired - to be further tested in functional context Visual Fields: Impaired-to be further tested in functional context Additional Comments: L gaze; slow beating horizontal nystagmus; poor visual attention; difficulty sustaining gaze Perception  Perception: Impaired Inattention/Neglect: Does not attend to right visual field Comments: Poor body and spatial awareness Praxis   Cognition Overall Cognitive Status: Impaired/Different from baseline Arousal/Alertness: Awake/alert Orientation Level: Person;Place;Situation Person: Oriented Place: Disoriented Situation: Disoriented Year: Other (Comment)("i dont know") Month: ("I don't know") Day of Week: Incorrect Memory: Impaired Immediate Memory Recall: Blue;Sock;Bed Memory Recall: (unable to recall any of the BIMS) Attention: Focused;Sustained Focused Attention: Appears  intact Sustained Attention: Impaired Sustained Attention Impairment: Functional basic Awareness: Impaired Awareness Impairment: Intellectual impairment Behaviors: Impulsive Safety/Judgment: Impaired Sensation Sensation Light Touch: Impaired Detail Light Touch Impaired Details: Impaired LUE;Impaired LLE Proprioception: Impaired Detail Proprioception Impaired Details: Impaired LUE;Impaired LLE Coordination Gross Motor Movements are Fluid and Coordinated: No Fine Motor Movements are Fluid and Coordinated: No Coordination and Movement Description: Lt hemiplegia Finger Nose Finger Test: Unable to follow commands to assess formally Motor  Motor Motor: Hemiplegia;Abnormal tone;Abnormal postural alignment and control Motor - Skilled Clinical Observations: Lt hemiplegia, coordination deficits Mobility  Bed Mobility Bed Mobility: Supine to Sit Supine to Sit: Minimal Assistance -  Patient > 75% Transfers Sit to Stand: Moderate Assistance - Patient 50-74%  Trunk/Postural Assessment  Cervical Assessment Cervical Assessment: (Lt rotation due to visual deficits) Thoracic Assessment Thoracic Assessment: Within Functional Limits Lumbar Assessment Lumbar Assessment: Within Functional Limits Postural Control Postural Control: Deficits on evaluation Righting Reactions: delayed  Balance Balance Balance Assessed: Yes Static Sitting Balance Static Sitting - Balance Support: Feet supported Static Sitting - Level of Assistance: 5: Stand by assistance Dynamic Sitting Balance Dynamic Sitting - Balance Support: Feet supported;During functional activity Dynamic Sitting - Level of Assistance: 3: Mod assist Static Standing Balance Static Standing - Balance Support: During functional activity Static Standing - Level of Assistance: 3: Mod assist Dynamic Standing Balance Dynamic Standing - Balance Support: During functional activity Dynamic Standing - Level of Assistance: 2: Max assist Dynamic  Standing - Comments: max A Extremity/Trunk Assessment RUE Assessment RUE Assessment: Exceptions to [facility] General Strength Comments: 0-90 shoulder FF, 3-/5 overall-difficulty to assess 2/2 cognitive deficits RUE Body System: Neuro Brunstrum levels for arm and hand: Arm;Hand Brunstrum level for arm: Stage V Relative Independence from Synergy LUE Assessment LUE Assessment: Exceptions to [facility] General Strength Comments: 0-90 shoulder FF, 3-/5 overall-difficulty to assess 2/2 cognitive deficits LUE Body System: Neuro Brunstrum levels for arm and hand: Arm;Hand Brunstrum level for arm: Stage V Relative Independence from Synergy     Refer to Care Plan for Long Term Goals  Recommendations for other services: None    Discharge Criteria: Patient will be discharged from OT if patient refuses treatment 3 consecutive times without medical reason, if treatment goals not met, if there is a change in medical status, if patient makes no progress towards goals or if patient is discharged from hospital.  The above assessment, treatment plan, treatment alternatives and goals were discussed and mutually agreed upon: by patient and by family  Valma Cava 08/19/2018, 1:02 PM

## 2018-08-18 NOTE — Care Management Note (Signed)
Case Management Note  Patient Details  Name: Thomas Park MRN: [id number] Date of Birth: January 01, 1991  Subjective/Objective:                    Action/Plan: Pt discharging to CIR today. CM signing off.   Expected Discharge Date:  08/18/18               Expected Discharge Plan:  Thomas Park  In-House Referral:     Discharge planning Services  CM Consult  Post Acute Care Choice:    Choice offered to:     DME Arranged:    DME Agency:     HH Arranged:    HH Agency:     Status of Service:  Completed, signed off  If discussed at [employer], dates discussed:    Additional Comments:  Pollie Friar, RN 08/18/2018, 3:36 PM

## 2018-08-18 NOTE — Discharge Summary (Addendum)
Physician Discharge Summary  Thomas Park HOZ:[id number] DOB: [birth year] DOA: 08/10/2018  PCP: Patient, No Pcp Per  Admit date: 08/10/2018 Discharge date: 08/18/2018  Admitted From: Home Disposition:  Home  Discharge Condition:Stable CODE STATUS:FULL Diet recommendation:  Regular    Brief/Interim Summary:  Patient is a 27 year old male recently diagnosed with HIV presented with flulike symptoms and disseminated rash.  He was also very confused and disoriented on presentation.  MRI of the brain showed numerous enhancing lesions greatest at the basal ganglia on the left side consistent with toxoplasmosis, mild to moderate vasogenic edema with regional mass-effect.  ID consulted, started on antibiotics.  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  He was seen by physical therapy and recommended CIR.  He is being discharged to CIR today.  ID will follow up with him over there.  Following problems were addressed during his hospitalization:  Encephalitis/toxoplasmosis: MRI finding as above.  Started on antibiotics.  Currently on clindamycin and atovaquone.ID waiting for by Pyrimethamine, sulfadiazine and leucovorin. Patient is still confused and disoriented.He is oriented to person only. But mental status might be  slowly improving. CT head done  to for follow-up and  ongoing encephalopathy.  It did not show  progression of the lesions as seen in the MRI.Plan for another CT head follow up. Echocardiogram showed normal left ventricular systolic function, filamentous structure in the right atrium possibly anatomic variant.  Blood cultures have been negative.  HIV: ID following.  Waiting on starting on antiretroviral. He has history of substance abuse.  No clinical signs of active withdrawal. Has low CD4 count and high viral load.  Chest x-ray did not show any pneumonia.  Hepatitis panel negative.  Gold TB  test negative, RPR negative.  Peripheral visual loss: Both eyes deviated towards the left lateral side.  Ophthalmology already saw the patient.  This is secondary to intracranial lesions.  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  Started on fluid restriction.  Last Sodium is 133.  Debility/deconditioning/disposition: PT evaluated  the patient and recommended CIR.    Urinary retention: Foley was inserted 2 days ago.  Plan for removal of foley today.  Nutrition Problem: Increased nutrient needs Etiology: chronic illness    Discharge Diagnoses:  Active Problems:   Encephalitis   Hyponatremia   Substance abuse ([place])   Brain lesion   Confusion    Discharge Instructions  Discharge Instructions    Diet general   Complete by:  As directed    Discharge instructions   Complete by:  As directed    Follow up with CIR   Increase activity slowly   Complete by:  As directed      Allergies as of 08/18/2018   No Known Allergies     Medication List    STOP taking these medications   fluconazole 200 MG tablet Commonly known as:  DIFLUCAN   ibuprofen 200 MG tablet Commonly known as:  ADVIL,MOTRIN   OVER THE COUNTER MEDICATION     TAKE these medications   acetaminophen 325 MG tablet Commonly known as:  TYLENOL Take 2 tablets (650 mg total) by mouth every 6 (six) hours as needed for mild pain (or Fever >/= 101).   multivitamins with iron Tabs tablet Take 1 tablet by mouth daily. Start taking on:  08/19/2018   NONFORMULARY OR COMPOUNDED ITEM Take 4 capsules by mouth every morning.   NONFORMULARY OR  COMPOUNDED ITEM Take 1 capsule by mouth every morning. Start taking on:  08/19/2018   NONFORMULARY OR COMPOUNDED ITEM Take 1 capsule by mouth every morning. Start taking on:  08/19/2018   ondansetron 4 MG tablet Commonly known as:  ZOFRAN Take 1 tablet (4 mg total) by mouth every 6 (six) hours as needed for nausea.   polyvinyl alcohol 1.4 %  ophthalmic solution Commonly known as:  LIQUIFILM TEARS Place 1 drop into both eyes as needed for dry eyes.   sulfaDIAZINE 500 MG tablet Take 3 tablets (1,500 mg total) by mouth every 6 (six) hours.   thiamine 100 MG tablet Take 1 tablet (100 mg total) by mouth daily. Start taking on:  08/19/2018      Follow-up Information    [facility]. Schedule an appointment as soon as possible for a visit in 5 day(s).   Why:  If unable to get an appointment at this clinic please call [facility] for an appointment: [phone] Contact information: [address] [place] [address] [phone]         No Known Allergies  Consultations: ID  Procedures/Studies: Dg Chest 2 View  Result Date: 08/12/2018 CLINICAL DATA:  Brain lesion.  History of HIV. EXAM: CHEST - 2 VIEW COMPARISON:  MRI 08/10/2018. FINDINGS: Mediastinum and hilar structures normal. Cardiomegaly with normal pulmonary vascularity. Mild right base subsegmental atelectasis/infiltrate. No pleural effusion or pneumothorax. No acute bony abnormality. IMPRESSION: Mild right base subsegmental atelectasis/infiltrate. Electronically Signed   By: Marcello Moores  Register   On: 08/12/2018 15:50   Ct Head W & Wo Contrast  Result Date: 08/14/2018 CLINICAL DATA:  In cephalopathy. HIV. EXAM: CT HEAD WITHOUT AND WITH CONTRAST TECHNIQUE: Contiguous axial images were obtained from the base of the skull through the vertex without and with intravenous contrast CONTRAST:  63mL OMNIPAQUE IOHEXOL 300 MG/ML  SOLN COMPARISON:  Brain MRI 08/10/2018 FINDINGS: Brain: Extensive edema at the level of the basal ganglia on the left is similar to the prior MRI. There is an 11 mm focus of hemorrhage in the left lentiform nucleus with intrinsic T1 hyperintensity and faint susceptibility artifact present on the prior MRI suggesting that the hemorrhage is not new. Numerous enhancing lesions are again seen  throughout both cerebral and cerebellar hemispheres without evidence of substantial interval progression compared to the MRI. The largest lesion is again noted in the left basal ganglia measuring 2.3 cm. Mild edema associated with a right temporoparietal lesion is unchanged. A 5 mm hyperdense lesion in the posterior left temporal lobe may also be hemorrhagic. There is no evidence of acute large territory infarct. Effacement of the left lateral ventricle due to the vasogenic edema is unchanged with minimal local rightward midline shift. There is no extra-axial fluid collection. Cerebral volume is normal. Vascular: Grossly patent major dural venous sinuses. Skull: No fracture or focal osseous lesion. Sinuses/Orbits: Visualized paranasal sinuses and mastoid air cells are clear. Unremarkable orbits. Other: None. IMPRESSION: 1. Numerous enhancing brain lesions without evidence of significant progression since the recent MRI consistent with the clinical diagnosis of toxoplasmosis. 2. Unchanged extensive edema in the left basal ganglia with a 11 mm hemorrhagic lesion in the left lentiform nucleus. Electronically Signed   By: Logan Bores M.D.   On: 08/14/2018 16:06   Mr Brain W And Wo Contrast  Result Date: 08/10/2018 CLINICAL DATA:  Confusion.  HIV. EXAM: MRI HEAD WITHOUT AND WITH CONTRAST TECHNIQUE: Multiplanar, multiecho pulse sequences of the  brain and surrounding structures were obtained without and with intravenous contrast. CONTRAST:  7 cc Gadavist intravenous COMPARISON:  None. FINDINGS: Brain: Multiple foci of nodular and ring enhancement along the subcortical brain, bilateral cerebellum, left brainstem, and basal ganglia/deep white matter tracts. The largest and most avidly enhancing lesion is centered at the left globus pallidus and measures 2.4 cm. This lesion also shows the greatest perilesional edema. There is restricted diffusion but this is at the periphery rather than the center of the lesions,  incompatible with pyogenic abscess. No acute hemorrhage, hydrocephalus, or extra-axial collection. No prominence of dilated perivascular spaces and no abnormal meningeal enhancement. Toxoplasmosis is favored. Other infection such as neurocysticercosis or tuberculosis is possible. Lymphoma is not favored given multi focality. Metastatic disease also considered unlikely given age. Vascular: Major flow voids are preserved. Skull and upper cervical spine: Current negative for marrow lesion Sinuses/Orbits: Negative IMPRESSION: 1. Numerous ring-enhancing lesions greatest at the left basal gangliawhich would classically represent toxoplasmosis in this setting (assuming matching CD4 level-which is currently pending). Other opportunistic infection is possible, including CNS tuberculosis, and needs correlation with labs. 2. Mild to moderate vasogenic edema with regional mass effect greatest at the left basal ganglia. Electronically Signed   By: Monte Fantasia M.D.   On: 08/10/2018 14:35   Dg Fluoro Guide Lumbar Puncture  Result Date: 08/11/2018 CLINICAL DATA:  Lumbar puncture. Patient is an HIV patient with abnormal MRI findings in the brain, suspicious for infection. EXAM: DIAGNOSTIC LUMBAR PUNCTURE UNDER FLUOROSCOPIC GUIDANCE FLUOROSCOPY TIME:  Fluoroscopy Time:  24 seconds Radiation Exposure Index (if provided by the fluoroscopic device): Not provided Number of Acquired Spot Images: 0 PROCEDURE: Informed consent was obtained from the patient prior to the procedure, including potential complications of headache, allergy, and pain. With the patient prone, the lower back was prepped with Betadine. 1% Lidocaine was used for local anesthesia. Lumbar puncture was performed at the L3 level using a 20 gauge needle with return of clear CSF with an opening pressure of 21 cm water. Thirty-two ml of CSF were obtained for laboratory studies. The patient tolerated the procedure well and there were no apparent complications. The  closing pressure was 10 cm of water. IMPRESSION: Successful LP as above. Electronically Signed   By: Dorise Bullion III M.D   On: 08/11/2018 11:25      Subjective:  Patient seen and examined the bedside this morning.  No new changes from yesterday.  Stable  for discharge to CIR today.  Discharge Exam: Vitals:   08/18/18 0800 08/18/18 1200  BP: 117/74 123/75  Pulse: 90 (!) 103  Resp: 16 18  Temp: 98 F (36.7 C) 97.8 F (36.6 C)  SpO2: 100% 97%   Vitals:   08/17/18 2012 08/18/18 0443 08/18/18 0800 08/18/18 1200  BP: 111/72 117/73 117/74 123/75  Pulse: 97 97 90 (!) 103  Resp: 18 18 16 18   Temp: 98 F (36.7 C) 97.7 F (36.5 C) 98 F (36.7 C) 97.8 F (36.6 C)  TempSrc: Oral Oral Oral Oral  SpO2: 93% 100% 100% 97%  Weight:      Height:        General: Pt is alert, awake, not in acute distress Cardiovascular: RRR, S1/S2 +, no rubs, no gallops Respiratory: CTA bilaterally, no wheezing, no rhonchi Abdominal: Soft, NT, ND, bowel sounds + Extremities: no edema, no cyanosis    The results of significant diagnostics from this hospitalization (including imaging, microbiology, ancillary and laboratory) are listed below for reference.  Microbiology: Recent Results (from the past 240 hour(s))  Acid Fast Smear (AFB)     Status: None   Collection Time: 08/11/18 11:20 AM  Result Value Ref Range Status   AFB Specimen Processing Concentration  Final   Acid Fast Smear Negative  Final    Comment: (NOTE) Performed At: [facility] [place], [place] [id number] Rush Farmer MD [id number]    Source (AFB) CSF  Final    Comment: Performed at [facility], [place] [address], [place], [place] [address]  Anaerobic culture     Status: None   Collection Time: 08/11/18 11:20 AM  Result Value Ref Range Status   Specimen Description CSF  Final   Special Requests NONE  Final   Culture   Final    NO ANAEROBES ISOLATED Performed at [facility],  [place] [address], [place], [place] [address]    Report Status 08/15/2018 FINAL  Final  CSF culture     Status: None   Collection Time: 08/11/18 11:20 AM  Result Value Ref Range Status   Specimen Description CSF  Final   Special Requests NONE  Final   Gram Stain NO WBC SEEN NO ORGANISMS SEEN   Final   Culture   Final    NO GROWTH 3 DAYS Performed at [facility], [place] [address] [place], [place] [address]    Report Status 08/14/2018 FINAL  Final  Fungus Culture With Stain     Status: None (Preliminary result)   Collection Time: 08/11/18 11:20 AM  Result Value Ref Range Status   Fungus Stain Final report  Final    Comment: (NOTE) Performed At: [facility] [place], [place] [id number] Rush Farmer MD [id number]    Fungus (Mycology) Culture PENDING  Incomplete   Fungal Source CSF  Final    Comment: Performed at [facility], [place] [address] [place], [place] [address]  Andee Poles, PCR     Status: Abnormal   Collection Time: 08/11/18 11:20 AM  Result Value Ref Range Status   Toxoplasma Gondii, PCR Positive (A) Negative Final    Comment: (NOTE) Toxoplasma gondii DNA Detected. This test was developed and its performance characteristics determined by [employer]. It has not been cleared or approved by the U.S. Food and Drug Administration. The FDA has determined that such clearance or approval is not necessary. This test is used for clinical purposes. It should not be regarded as investigational or research. Performed At: [facility] [place], [place] [id number] Rush Farmer MD [id number]   Fungus Culture Result     Status: None   Collection Time: 08/11/18 11:20 AM  Result Value Ref Range Status   Result 1 Comment  Final    Comment: (NOTE) KOH/Calcofluor preparation:  no fungus observed. Performed At: [facility] [address] [place], [place] [id number] Rush Farmer MD [id number]    Culture, blood (Routine X 2) w Reflex to ID Panel     Status: None   Collection Time: 08/12/18 11:12 AM  Result Value Ref Range Status   Specimen Description BLOOD RIGHT ANTECUBITAL  Final   Special Requests   Final    BOTTLES DRAWN AEROBIC AND ANAEROBIC Blood Culture adequate volume   Culture   Final    NO GROWTH 5 DAYS Performed at [facility], [address], [place], [place] [address]    Report Status 08/17/2018 FINAL  Final  Culture, blood (  Routine X 2) w Reflex to ID Panel     Status: None   Collection Time: 08/12/18 11:19 AM  Result Value Ref Range Status   Specimen Description BLOOD LEFT ANTECUBITAL  Final   Special Requests   Final    BOTTLES DRAWN AEROBIC AND ANAEROBIC Blood Culture adequate volume   Culture   Final    NO GROWTH 5 DAYS Performed at [facility], [address] [place], [place] [address]    Report Status 08/17/2018 FINAL  Final     Labs: BNP (last 3 results) No results for input(s): BNP in the last 8760 hours. Basic Metabolic Panel: Recent Labs  Lab 08/12/18 0617 08/13/18 0505 08/14/18 0607 08/15/18 0411 08/17/18 0410  NA 128* 128* 130* 131* 133*  K 4.0 4.4 4.6 4.1 3.7  CL 100 99 102 101 101  CO2 21* 18* 17* 22 21*  GLUCOSE 93 92 87 103* 96  BUN 7 9 12 14 12   CREATININE 0.96 1.03 0.91 0.90 0.71  CALCIUM 8.5* 8.5* 8.7* 8.7* 8.7*   Liver Function Tests: No results for input(s): AST, ALT, ALKPHOS, BILITOT, PROT, ALBUMIN in the last 168 hours. No results for input(s): LIPASE, AMYLASE in the last 168 hours. No results for input(s): AMMONIA in the last 168 hours. CBC: Recent Labs  Lab 08/12/18 0617 08/13/18 0505 08/14/18 0607  WBC 4.2 4.1 5.2  NEUTROABS 2.3 1.8 2.9  HGB 12.9* 12.8* 13.6  HCT 39.1 39.2 41.3  MCV 83.0 84.7 83.8  PLT 347 315 328   Cardiac Enzymes: No results for input(s): CKTOTAL, CKMB, CKMBINDEX, TROPONINI in the last 168 hours. BNP: Invalid input(s): POCBNP CBG: No results for input(s): GLUCAP in the  last 168 hours. D-Dimer No results for input(s): DDIMER in the last 72 hours. Hgb A1c No results for input(s): HGBA1C in the last 72 hours. Lipid Profile No results for input(s): CHOL, HDL, LDLCALC, TRIG, CHOLHDL, LDLDIRECT in the last 72 hours. Thyroid function studies No results for input(s): TSH, T4TOTAL, T3FREE, THYROIDAB in the last 72 hours.  Invalid input(s): FREET3 Anemia work up No results for input(s): VITAMINB12, FOLATE, FERRITIN, TIBC, IRON, RETICCTPCT in the last 72 hours. Urinalysis    Component Value Date/Time   COLORURINE YELLOW 08/12/2018 1918   APPEARANCEUR CLEAR 08/12/2018 1918   LABSPEC 1.010 08/12/2018 1918   PHURINE 6.0 08/12/2018 1918   GLUCOSEU NEGATIVE 08/12/2018 1918   HGBUR NEGATIVE 08/12/2018 1918   BILIRUBINUR NEGATIVE 08/12/2018 1918   KETONESUR NEGATIVE 08/12/2018 1918   PROTEINUR NEGATIVE 08/12/2018 1918   NITRITE NEGATIVE 08/12/2018 1918   LEUKOCYTESUR NEGATIVE 08/12/2018 1918   Sepsis Labs Invalid input(s): PROCALCITONIN,  WBC,  LACTICIDVEN Microbiology Recent Results (from the past 240 hour(s))  Acid Fast Smear (AFB)     Status: None   Collection Time: 08/11/18 11:20 AM  Result Value Ref Range Status   AFB Specimen Processing Concentration  Final   Acid Fast Smear Negative  Final    Comment: (NOTE) Performed At: [facility] [place], [place] [id number] Rush Farmer MD ZH:[id number]    Source (AFB) CSF  Final    Comment: Performed at [facility], [place] [address], [place], [place] [address]  Anaerobic culture     Status: None   Collection Time: 08/11/18 11:20 AM  Result Value Ref Range Status   Specimen Description CSF  Final   Special Requests NONE  Final   Culture   Final    NO ANAEROBES ISOLATED  Performed at [facility], [place] [address] [place], [place] [address]    Report Status 08/15/2018 FINAL  Final  CSF culture     Status: None   Collection Time: 08/11/18 11:20 AM  Result Value Ref  Range Status   Specimen Description CSF  Final   Special Requests NONE  Final   Gram Stain NO WBC SEEN NO ORGANISMS SEEN   Final   Culture   Final    NO GROWTH 3 DAYS Performed at [facility], [place] [address], [place], [place] [address]    Report Status 08/14/2018 FINAL  Final  Fungus Culture With Stain     Status: None (Preliminary result)   Collection Time: 08/11/18 11:20 AM  Result Value Ref Range Status   Fungus Stain Final report  Final    Comment: (NOTE) Performed At: [facility] [place], [place] [id number] Rush Farmer MD [id number]    Fungus (Mycology) Culture PENDING  Incomplete   Fungal Source CSF  Final    Comment: Performed at [facility], [place] [address], [place], [place] [address]  Andee Poles, PCR     Status: Abnormal   Collection Time: 08/11/18 11:20 AM  Result Value Ref Range Status   Toxoplasma Gondii, PCR Positive (A) Negative Final    Comment: (NOTE) Toxoplasma gondii DNA Detected. This test was developed and its performance characteristics determined by [employer]. It has not been cleared or approved by the U.S. Food and Drug Administration. The FDA has determined that such clearance or approval is not necessary. This test is used for clinical purposes. It should not be regarded as investigational or research. Performed At: [facility] [place], [place] [id number] Rush Farmer MD ZD:[id number]   Fungus Culture Result     Status: None   Collection Time: 08/11/18 11:20 AM  Result Value Ref Range Status   Result 1 Comment  Final    Comment: (NOTE) KOH/Calcofluor preparation:  no fungus observed. Performed At: [facility] [address] [place], [place] [id number] Rush Farmer MD [id number]   Culture, blood (Routine X 2) w Reflex to ID Panel     Status: None   Collection Time: 08/12/18 11:12 AM  Result Value Ref Range Status   Specimen Description BLOOD  RIGHT ANTECUBITAL  Final   Special Requests   Final    BOTTLES DRAWN AEROBIC AND ANAEROBIC Blood Culture adequate volume   Culture   Final    NO GROWTH 5 DAYS Performed at [facility], [address], [place],  [address]    Report Status 08/17/2018 FINAL  Final  Culture, blood (Routine X 2) w Reflex to ID Panel     Status: None   Collection Time: 08/12/18 11:19 AM  Result Value Ref Range Status   Specimen Description BLOOD LEFT ANTECUBITAL  Final   Special Requests   Final    BOTTLES DRAWN AEROBIC AND ANAEROBIC Blood Culture adequate volume   Culture   Final    NO GROWTH 5 DAYS Performed at [facility], [place] [address] [place],  [address]    Report Status 08/17/2018 FINAL  Final    Please note: You were cared for by a hospitalist during your hospital stay. Once you are discharged, your primary care physician will handle any further medical issues. Please note that NO REFILLS for any discharge medications will be authorized once you are discharged, as it is imperative that  you return to your primary care physician (or establish a relationship with a primary care physician if you do not have one) for your post hospital discharge needs so that they can reassess your need for medications and monitor your lab values.    Time coordinating discharge: 40 minutes  SIGNED:   Shelly Coss, MD  [facility] 08/18/2018, 2:57 PM Pager [phone]  If 7PM-7AM, please contact night-coverage [email or web address] Password [email or web address]

## 2018-08-19 ENCOUNTER — Inpatient Hospital Stay (HOSPITAL_COMMUNITY): Payer: Self-pay | Admitting: Occupational Therapy

## 2018-08-19 ENCOUNTER — Other Ambulatory Visit: Payer: Self-pay

## 2018-08-19 ENCOUNTER — Inpatient Hospital Stay (HOSPITAL_COMMUNITY): Payer: Self-pay | Admitting: Physical Therapy

## 2018-08-19 ENCOUNTER — Inpatient Hospital Stay (HOSPITAL_COMMUNITY): Payer: Self-pay | Admitting: Speech Pathology

## 2018-08-19 DIAGNOSIS — B20 Human immunodeficiency virus [HIV] disease: Secondary | ICD-10-CM

## 2018-08-19 DIAGNOSIS — B582 Toxoplasma meningoencephalitis: Secondary | ICD-10-CM

## 2018-08-19 LAB — COMPREHENSIVE METABOLIC PANEL
ALT: 34 U/L (ref 0–44)
AST: 41 U/L (ref 15–41)
Albumin: 3.2 g/dL — ABNORMAL LOW (ref 3.5–5.0)
Alkaline Phosphatase: 65 U/L (ref 38–126)
Anion gap: 10 (ref 5–15)
BILIRUBIN TOTAL: 0.7 mg/dL (ref 0.3–1.2)
BUN: 8 mg/dL (ref 6–20)
CO2: 24 mmol/L (ref 22–32)
Calcium: 8.9 mg/dL (ref 8.9–10.3)
Chloride: 102 mmol/L (ref 98–111)
Creatinine, Ser: 0.79 mg/dL (ref 0.61–1.24)
GFR calc Af Amer: >60 mL/min (ref >60)
Glucose, Bld: 97 mg/dL (ref 70–99)
Potassium: 3.6 mmol/L (ref 3.5–5.1)
Sodium: 136 mmol/L (ref 135–145)
Total Protein: 7.4 g/dL (ref 6.5–8.1)

## 2018-08-19 LAB — CBC WITH DIFFERENTIAL/PLATELET
Abs Immature Granulocytes: 0.23 10*3/uL — ABNORMAL HIGH (ref 0.00–0.07)
Basophils Absolute: 0.1 10*3/uL (ref 0.0–0.1)
Basophils Relative: 1 %
Eosinophils Absolute: 0.9 10*3/uL — ABNORMAL HIGH (ref 0.0–0.5)
Eosinophils Relative: 14 %
HEMATOCRIT: 39.6 % (ref 39.0–52.0)
Hemoglobin: 12.8 g/dL — ABNORMAL LOW (ref 13.0–17.0)
Immature Granulocytes: 4 %
Lymphocytes Relative: 21 %
Lymphs Abs: 1.3 10*3/uL (ref 0.7–4.0)
MCH: 27 pg (ref 26.0–34.0)
MCHC: 32.3 g/dL (ref 30.0–36.0)
MCV: 83.5 fL (ref 80.0–100.0)
Monocytes Absolute: 0.9 10*3/uL (ref 0.1–1.0)
Monocytes Relative: 14 %
Neutro Abs: 3.1 10*3/uL (ref 1.7–7.7)
Neutrophils Relative %: 46 %
Platelets: 400 10*3/uL (ref 150–400)
RBC: 4.74 MIL/uL (ref 4.22–5.81)
RDW: 13.4 % (ref 11.5–15.5)
WBC: 6.5 10*3/uL (ref 4.0–10.5)
nRBC: 0 % (ref 0.0–0.2)

## 2018-08-19 MED ORDER — HYDROCERIN EX CREA
TOPICAL_CREAM | CUTANEOUS | Status: DC | PRN
  Administered 2018-08-23: 1 via TOPICAL
  Administered 2018-08-23: 06:00:00 via TOPICAL
  Administered 2018-08-24: 1 via TOPICAL
  Administered 2018-08-30 – 2018-08-31 (×2): via TOPICAL
  Filled 2018-08-19 (×2): qty 113

## 2018-08-19 NOTE — Progress Notes (Signed)
Initial Nutrition Assessment  DOCUMENTATION CODES:   Not applicable  INTERVENTION:   - Continue MVI with minerals - Continue Boost Breeze TID (each provides 250 kcal and 9 g protein)  NUTRITION DIAGNOSIS:   Increased nutrient needs related to chronic illness as evidenced by estimated needs.  GOAL:   Patient will meet greater than or equal to 90% of their needs   MONITOR:   PO intake, Supplement acceptance, Labs, Weight trends  REASON FOR ASSESSMENT:   Malnutrition Screening Tool    ASSESSMENT:   27 yo male, admitted to rehab with CNS cytotoxoplasmosis. PMH significant for polysubstance abuse, recent HIV dx. Hx obtained from family at bedside. Transferred to rehab 12/5.  Labs: Hbg 12.8 Meds: MVI with minerals, Boost Breeze TID, thiamine 100 mg daily  Pt awake and sitting in chair at time of visit. Father present. [employer] used for translation Thomas Park [phone])  Pt nonverbal, father provided answers to questions.  As at last visit, father unsure of UBW or pt typical meal pattern at home.  States pt is eating a portion of all his meals, which is sometimes food brought in from outside. Reports some nausea when pt began eating PO, but this has subsided. Denies vomiting, diarrhea, or constipation. Last BM this morning.   Describes some difficulty swallowing. Encouraged father to chop up foods, add extra sauce/gravy, and provide beverages at meal times. Encouraged father to make sure pt gets protein-rich foods with meals and snacks, and to provide adequate hydration given high activity level of rehab. Brought pt a [employer].   NUTRITION - FOCUSED PHYSICAL EXAM:   Most Recent Value  Orbital Region  No depletion  Upper Arm Region  No depletion  Thoracic and Lumbar Region  Mild depletion  Buccal Region  Mild depletion  Temple Region  No depletion  Clavicle Bone Region  No depletion  Clavicle and Acromion Bone Region  No depletion  Scapular Bone Region  No depletion   Dorsal Hand  Unable to assess  Patellar Region  No depletion  Anterior Thigh Region  No depletion  Posterior Calf Region  No depletion  Edema (RD Assessment)  None  Hair  Reviewed  Eyes  Other (Comment) red  Mouth  Unable to assess  Skin  Reviewed  Nails  Reviewed      Diet Order:  25% meal completion, per nsg documentation Diet Order            Diet regular Room service appropriate? Yes; Fluid consistency: Thin; Fluid restriction: 1500 mL Fluid  Diet effective now              EDUCATION NEEDS:  Not appropriate for education at this time  Skin:  Skin Assessment: Reviewed RN Assessment  Last BM:  12/6, per pt  Height:  Ht Readings from Last 1 Encounters:  08/18/18 5\' 8"  (1.727 m)    Weight:  Wt Readings from Last 1 Encounters:  08/18/18 72.3 kg    Ideal Body Weight:  70 kg  BMI:  Body mass index is 24.24 kg/m.  Estimated Nutritional Needs:   Kcal:  1954 calories daily (MSJ x 1.3)  Protein:  61-86 gm daily (1.0 - 1.4 g/kg ABW)  Fluid:  </= 2L daily or per MD discretion  Thomas Grimmer, MS, RDN, LDN Pager: [phone]

## 2018-08-19 NOTE — IPOC Note (Signed)
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[facility])   Neurologic gait disorder     Functional Problem List: Nursing Behavior, Bladder, Endurance, Medication Management, Motor, Perception, Safety  PT Balance, Endurance, Motor, Sensory, Safety  OT Balance, Behavior, Cognition, Endurance, Motor, Perception, Safety, Sensory, Vision  SLP Cognition  TR         Basic ADL's: OT Eating, Grooming, Bathing, Dressing, Toileting     Advanced  ADL's: OT       Transfers: PT Bed Mobility, Bed to Chair, Car, [employer], Floor  OT Tub/Shower, Agricultural engineer: PT Stairs, Emergency planning/management officer, Ambulation     Additional Impairments: OT Fuctional Use of Upper Extremity  SLP Social Cognition   Problem Solving, Memory, Attention, Awareness  TR      Anticipated Outcomes Item Anticipated Outcome  Self Feeding Supervision  Swallowing      Basic self-care  Supervision  Toileting  Supervision   Bathroom Transfers Supervision  Bowel/Bladder  Min assist  Transfers  supervision  Locomotion  supervision  Communication     Cognition  min assist   Pain  < 3  Safety/Judgment  Min assist   Therapy Plan: PT Intensity: Minimum of 1-2 x/day ,45 to 90 minutes PT Frequency: 5 out of 7 days PT Duration Estimated Length of Stay: 14-17 OT Intensity: Minimum of 1-2 x/day, 45 to 90 minutes OT Frequency: 5 out of 7 days OT Duration/Estimated Length of Stay: 21-24 days SLP Intensity: Minumum of 1-2 x/day, 30 to 90 minutes SLP Frequency: 3 to 5 out of 7 days SLP Duration/Estimated Length of Stay: 14-17 days     Team Interventions: Nursing Interventions Patient/Family Education, Bladder Management, Medication Management, Discharge Planning, Psychosocial Support, Disease Management/Prevention,  Cognitive Remediation/Compensation  PT interventions Ambulation/gait training, Pain management, Stair training, Training and development officer, DME/adaptive equipment instruction, Patient/family education, Therapeutic Activities, Wheelchair propulsion/positioning, Cognitive remediation/compensation, Therapeutic Exercise, Community reintegration, Functional mobility training, UE/LE Strength taining/ROM, Discharge planning, Neuromuscular re-education, Splinting/orthotics, UE/LE Coordination activities  OT Interventions Training and development officer, Cognitive remediation/compensation, Community reintegration, Discharge planning, DME/adaptive equipment instruction, Functional mobility training, Neuromuscular re-education, Patient/family education, Psychosocial support, Self Care/advanced ADL retraining, Therapeutic Activities, Therapeutic Exercise, UE/LE Strength taining/ROM, UE/LE Coordination activities, Visual/perceptual remediation/compensation, Wheelchair propulsion/positioning  SLP Interventions Cognitive remediation/compensation, English as a second language teacher, Functional tasks, Patient/family education, Internal/external aids, Environmental controls  TR Interventions    SW/CM Interventions Discharge Planning, Psychosocial Support, Patient/Family Education   Barriers to Discharge MD  Medical stability  Nursing Behavior brain injury  PT      OT      SLP      SW       Team Discharge Planning: Destination: PT-Home ,OT- Home , SLP-Home Projected Follow-up: PT-Home health PT, OT-  Home health OT, SLP-Home Health SLP, 24 hour supervision/assistance Projected Equipment Needs: PT-To be determined, OT- To be determined, SLP-None recommended by SLP Equipment Details: PT- , OT-  Patient/family involved in discharge planning: PT- Family member/caregiver,  OT-Patient, Family member/caregiver, SLP-Family member/caregiver  MD ELOS: 14-17 days Medical Rehab Prognosis:  Excellent Assessment: The patient has been  admitted for CIR therapies with the diagnosis of CNS toxoplasmosis. The team will be addressing functional mobility, strength, stamina, balance, safety, adaptive techniques and equipment, self-care, bowel and bladder mgt, patient and caregiver education, NMR, cognition/communication, community reentry. Goals have been set at supervision for  mobility, self-care and cognition.    Meredith Staggers, MD, FAAPMR      See Team Conference Notes for weekly updates to the plan of care

## 2018-08-19 NOTE — Evaluation (Signed)
Speech Language Pathology Assessment and Plan  Patient Details  Name: Thomas Park MRN: [id number] Date of Birth: 08-09-1991  SLP Diagnosis: Cognitive Impairments  Rehab Potential: Good ELOS: 14-17 days     Today's Date: 08/19/2018 SLP Individual Time: 1305-1400 SLP Individual Time Calculation (min): 55 min   Problem List:  Patient Active Problem List   Diagnosis Date Noted  . Neurologic gait disorder   . Brain lesion   . Confusion   . Hyponatremia   . Substance abuse ([place])   . Cerebritis 08/10/2018  . Symptomatic HIV infection ([place])   . 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 Anemia    [name] 08/10/2018  . HIV (human immunodeficiency virus infection) ([place])    dx'd 07/2018  . 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 NO PAST SURGERIES      Assessment / Plan / Recommendation Clinical Impression   Thomas Park Thomas Park is a 27 year old right handed non-English-speaking male history of polysubstance abuse recent diagnosed HIV. Per chart review, patient lives with sister and independent prior to admission working full-time as a Systems developer. Family planning assistance as needed on discharge. Presented 08/10/2018 with altered mental status as well as unintentional weight loss over the past 3 months. MRI of the brain revealed numerous masses. 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 Mild to moderate vasogenic edema with regional mass effect greatest at the left basal ganglia. Echocardiogram with ejection fraction of 65% no wall motion abnormalities. CD4 T-cell less than 10. 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 Tolerating a  regular diet. Ophthalmology follow-up for peripheral vision loss as well as both eyes deviating towards the left lateral side felt to be secondary to intracranial lesions and currently advised to continue to monitor. Noted bouts of urinary retention with latest bladder scan 536 mL requiring Coude catheter and plan voiding trial. Therapy evaluations completed with recommendations of physical medicine rehabilitation consult. Patient was admitted for a comprehensive rehabilitation program on 08/18/2018 and SLP evaluation was completed on 08/19/2018 with the following results: Pt presents with severe cognitive deficits which are exacerbated by profound visual impairment impacting his ability to complete ADLs.  Pt was oriented to self only and had decreased sustained attention to tasks, decreased functional problem solving, decreased recall of information, decreased intellectual awareness of deficits, and decreased safety awareness.  Currently, pt requires max to total assist to complete basic tasks.   As a result, pt would benefit from skilled ST while inpatient in order to maximize functional independence and reduce burden of care prior to discharge.  Anticipate that pt will need 24/7 supervision at discharge in addition to [place] follow up at next level of care.  Discussed strategies to maximize cognitive stimulation and attention to tasks with pt's father who was present during today's evaluation.  He verbalized understanding and all questions were answered to his satisfaction at this time.    Skilled Therapeutic Interventions          Cognitive-linguistic evaluation completed with results and recommendations reviewed with family.     SLP Assessment  Patient will need skilled Speech Lanaguage Pathology Services during CIR admission    Recommendations  Recommendations for Other Services: Neuropsych consult  Patient destination: Home Follow up Recommendations: Home Health SLP;24 hour  supervision/assistance Equipment Recommended: None recommended by SLP    SLP Frequency 3 to 5 out of 7 days   SLP Duration  SLP Intensity  SLP Treatment/Interventions 14-17 days   Minumum of 1-2 x/day, 30 to 90 minutes  Cognitive remediation/compensation;Cueing hierarchy;Functional tasks;Patient/family education;Internal/external aids;Environmental controls    Pain Pain Assessment Pain Scale: 0-10 Pain Score: 0-No pain  Prior Functioning Cognitive/Linguistic Baseline: Within functional limits Type of Home: House  Lives With: Family Available Help at Discharge: Family;Available 24 hours/day Vocation: Full time employment  Short Term Goals: Week 1: SLP Short Term Goal 1 (Week 1): Pt will orient to place and date with max verbal cues.   SLP Short Term Goal 2 (Week 1): Pt will sustain his attention to basic, functional tasks for 5 minute intervals with mod assist cues for redirection.   SLP Short Term Goal 3 (Week 1): Pt will complete basic, familiar tasks with max assist for functional problem solving.   SLP Short Term Goal 4 (Week 1): Pt will recall basic, daily information with max assist verbal cues.    Refer to Care Plan for Long Term Goals  Recommendations for other services: Neuropsych  Discharge Criteria: Patient will be discharged from SLP if patient refuses treatment 3 consecutive times without medical reason, if treatment goals not met, if there is a change in medical status, if patient makes no progress towards goals or if patient is discharged from hospital.  The above assessment, treatment plan, treatment alternatives and goals were discussed and mutually agreed upon: by family  Emilio Math 08/19/2018, 4:52 PM

## 2018-08-19 NOTE — Progress Notes (Signed)
Social Work  Social Work Assessment and Plan  Patient Details  Name: Thomas Park MRN: [id number] Date of Birth: 1990-11-08  Today's Date: 08/19/2018  Problem List:  Patient Active Problem List   Diagnosis Date Noted  . Neurologic gait disorder   . Brain lesion   . Confusion   . Hyponatremia   . Substance abuse ([place])   . Cerebritis 08/10/2018  . AIDS ([place])   . 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 Anemia    [name] 08/10/2018  . HIV (human immunodeficiency virus infection) ([place])    dx'd 07/2018  . 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 NO PAST SURGERIES     Social History:  reports that he has never smoked. He has never used smokeless tobacco. He reports that he drinks about 6.0 standard drinks of alcohol per week. He reports that he has current or past drug history.  Family / Support Systems Marital Status: Single Patient Roles: Other (Comment)(has siblings, parents) Children: none Other Supports: sister, Eather Colas @ [phone];  sister, Tressie Ellis @ [phone] Anticipated Caregiver: siblings and Dad Ability/Limitations of Caregiver: family work but rotate schedule Caregiver Availability: 24/7 Family Dynamics: Father reports that he and pt's sisters are working together to make sure someone is with pt at all times.  Very supportive.  Social History Preferred language: Spanish Religion: Christian Cultural Background: [place] ~ 7 yrs ago Education: Dad speculates he may have completed through the 3rd grade in [place] Read: Yes(Spanish only) Write: Yes(Spanish only) Employment Status: Employed Name of Employer: was working at a [employer] of Employment: 6(yrs) Return to Work Plans: very doubtful Public relations account executive Issues: Pt is not a legal  resident Guardian/Conservator: per MD, pt is not capable of making decisions on his own behalf - defer to father   Abuse/Neglect Abuse/Neglect Assessment Can Be Completed: Unable to assess, patient is non-responsive or altered mental status  Emotional Status Pt's affect, behavior and adjustment status: Pt with significant cognitive deficits and unable to complete assessment interview.  Father provides needed information.  Pt sitting in w/c and smiling - does not outwardly appear to be in any emotional distress.  May benefit from neuropsychology at some point - will monitor. Recent Psychosocial Issues: None Psychiatric History: None Substance Abuse History: Father reports that he has just learned (with pt's admission) of pt's drug use.  He has seen him "drink with his friends" but not to excess.  Patient / Family Perceptions, Expectations & Goals Pt/Family understanding of illness & functional limitations: Per chart, it appears pt and family aware of his new HIV diagnosis and have understanding he has "infection in his brain..."  per father.  General understanding of his deficits/ need for CIR. Premorbid pt/family roles/activities: Pt was independent overall, however, declining rapidly PTA. Anticipated changes in roles/activities/participation: Pt will require 24/7 assistance - sisters to provide 24/7 Pt/family expectations/goals: Pt unable to articulate goals due to cognitive deficits.  Community Resources [employer]: None Premorbid Home Care/DME Agencies: None Transportation available at discharge: yes Resource referrals recommended: Neuropsychology, Advocacy groups  Discharge Planning Living Arrangements: Other relatives(lives with sister, [name]) Support Systems: Other relatives, Parent Type of Residence: Private residence Insurance Resources: Teacher, adult education Resources: Family Support Financial Screen Referred: Previously completed Living Expenses: Lives with family Money  Management: Patient Does the patient have any problems obtaining your medications?: Yes (Describe)(no insurance) Home Management: pt and family shared responsibilities Patient/Family Preliminary Plans: Pt to d/c home with sister, [name], and her children.  Other local family to aide sister in providing 24/7 supervision. Social Work Anticipated Follow Up Needs: HH/OP Expected length of stay: 14-17 days  Clinical Impression Very unfortunate gentleman with newly diagnosed HIV and now with encephalopathy due to toxoplasmosis.  Significant cognitive impairment and needs father to assist with completion of assessment interview.  Family aware and able to provide 24/7 support at d/c.  FAmily appears very involved and supportive to pt, however, father notes he was upset to learn of pt's drug use.  Cannot assess pt's current mood/ emotional status.  Does not appear distressed.  Will need close medical management as an outpatient.  Will involve interpreter services through entire CIR stay.  Baily Serpe 08/19/2018, 5:00 PM

## 2018-08-19 NOTE — Progress Notes (Signed)
Inpatient Rehabilitation  Patient information reviewed and entered into eRehab system by Zayli Villafuerte M. Lanasia Porras, M.A., CCC/SLP, PPS Coordinator.  Information including medical coding, functional ability and quality indicators will be reviewed and updated through discharge.    

## 2018-08-19 NOTE — Evaluation (Signed)
Physical Therapy Assessment and Plan  Patient Details  Name: Thomas Park MRN: [id number] Date of Birth: Nov 29, 1990  PT Diagnosis: Abnormality of gait, Cognitive deficits, Difficulty walking, Impaired cognition, Impaired sensation and Muscle weakness Rehab Potential: Good ELOS: 14-17   Today's Date: 08/19/2018 PT Individual Time: 4481-8563 PT Individual Time Calculation (min): 57 min    Problem List:  Patient Active Problem List   Diagnosis Date Noted  . Neurologic gait disorder   . Brain lesion   . Confusion   . Hyponatremia   . Substance abuse ([place])   . Cerebritis 08/10/2018  . Symptomatic HIV infection ([place])   . 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 Anemia    [name] 08/10/2018  . HIV (human immunodeficiency virus infection) ([place])    dx'd 07/2018  . 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 NO PAST SURGERIES      Assessment & Plan Clinical Impression: Patient is a 27 y.o. year old male with recent admission to the hospital on 08/10/2018 with altered mental status as well as unintentional weight loss over the past 3 months. MRI of the brain revealed numerous masses. 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 Mild to moderate vasogenic edema with regional mass effect greatest at the left basal ganglia. Echocardiogram with ejection fraction of 65% no wall motion abnormalities. CD4 T-cell less than 10. 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 Tolerating a regular diet. Ophthalmology follow-up for peripheral vision loss as well as both eyes deviating towards the left lateral side felt to be secondary to intracranial lesions and currently  advised to continue to monitor.  Patient transferred to CIR on 08/18/2018 .   Patient currently requires max with mobility secondary to muscle weakness, decreased cardiorespiratoy endurance, decreased attention, decreased awareness, decreased safety awareness and delayed processing and decreased sitting balance, decreased standing balance, decreased postural control, hemiplegia and decreased balance strategies.  Prior to hospitalization, patient was independent  with mobility and lived with Family in a House home.  Home access is 2Stairs to enter.  Patient will benefit from skilled PT intervention to maximize safe functional mobility, minimize fall risk and decrease caregiver burden for planned discharge home with 24 hour supervision.  Anticipate patient will benefit from follow up [place] at discharge.  PT - End of Session Activity Tolerance: Tolerates 30+ min activity with multiple rests Endurance Deficit: Yes PT Assessment Rehab Potential (ACUTE/IP ONLY): Good PT Patient demonstrates impairments in the following area(s): Balance;Endurance;Motor;Sensory;Safety PT Transfers Functional Problem(s): Bed Mobility;Bed to Chair;Car;Furniture;Floor PT Locomotion Functional Problem(s): Stairs;Wheelchair Mobility;Ambulation PT Plan PT Intensity: Minimum of 1-2 x/day ,45 to 90 minutes PT Frequency: 5 out of 7 days PT Duration Estimated Length of Stay: 14-17 PT Treatment/Interventions: Ambulation/gait training;Pain management;Stair training;Balance/vestibular training;DME/adaptive equipment instruction;Patient/family education;Therapeutic Activities;Wheelchair propulsion/positioning;Cognitive remediation/compensation;Therapeutic Exercise;Community reintegration;Functional mobility training;UE/LE Strength taining/ROM;Discharge planning;Neuromuscular re-education;Splinting/orthotics;UE/LE Coordination activities PT Transfers Anticipated Outcome(s): supervision PT Locomotion Anticipated Outcome(s): supervision PT  Recommendation Follow Up Recommendations: Home health PT Patient destination: Home Equipment Recommended: To be determined  Skilled Therapeutic Intervention Pt participated in skilled PT eval and was educated on PT POC and goals.  Pt performs supine to sit with min A.  Min A for sitting balance with total A to don  shoes.  Pt performs stand pivot transfers throughout session with mod A, max cuing due to visual deficits.  Pt unable to bring eyes to midline, eyes always with Lt gaze. With cuing, pt able to turn head to locate objects at midline. Pt requires mod A for gait with HHA, max manual facilitation for gait in straight line and turning to sit as pt tends to veer Lt due to visual deficits.  Stair negotiation with 2 handrails with mod A, cues for placing foot fully on step and cues for attention to Lt UE.  Simulated car transfers with mod A, max cuing due to vision.  nustep x 5 minutes with divided attention task of locating objects at midline with max cuing.  Pt with impairments in attention and awareness evidient during eval, impulsive.  PT Evaluation Precautions/Restrictions Precautions Precautions: Fall Restrictions Weight Bearing Restrictions: No Pain Pain Assessment Pain Scale: Faces Faces Pain Scale: No hurt Home Living/Prior Functioning Home Living Available Help at Discharge: Family;Available 24 hours/day Type of Home: House Home Access: Stairs to enter [employer] of Steps: 2 Home Layout: One level  Lives With: Family Prior Function Level of Independence: Independent with basic ADLs;Independent with transfers;Independent with gait  Able to Take Stairs?: Yes Driving: No Vocation: Full time employment Comments: 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  Cognition Overall Cognitive Status: Impaired/Different from baseline Arousal/Alertness: Awake/alert Orientation Level: Oriented to person;Disoriented to place;Disoriented to  time;Disoriented to situation Attention: Focused;Sustained Focused Attention: Appears intact Sustained Attention: Impaired Sustained Attention Impairment: Functional basic Awareness: Impaired Awareness Impairment: Intellectual impairment Behaviors: Impulsive Safety/Judgment: Impaired Sensation Sensation Light Touch: Impaired Detail Light Touch Impaired Details: Impaired LUE;Impaired LLE Proprioception: Impaired Detail Proprioception Impaired Details: Impaired LUE;Impaired LLE Coordination Gross Motor Movements are Fluid and Coordinated: No Fine Motor Movements are Fluid and Coordinated: No Coordination and Movement Description: Lt hemiplegia Motor  Motor Motor: Hemiplegia;Abnormal tone;Abnormal postural alignment and control Motor - Skilled Clinical Observations: Lt hemiplegia  Mobility Bed Mobility Bed Mobility: Supine to Sit Supine to Sit: Minimal Assistance - Patient > 75% Transfers Transfers: Risk manager;Sit to Stand Sit to Stand: Moderate Assistance - Patient 50-74% Stand Pivot Transfers: Moderate Assistance - Patient 50 - 74% Transfer (Assistive device): None Locomotion  Gait Ambulation: Yes Gait Assistance: Moderate Assistance - Patient 50-74% Gait Distance (Feet): 25 Feet Assistive device: 1 person hand held assist Stairs / Additional Locomotion Stairs: Yes Stairs Assistance: Moderate Assistance - Patient 50 - 74% Stair Management Technique: Two rails Number of Stairs: 8 Wheelchair Mobility Wheelchair Mobility: No  Trunk/Postural Assessment  Cervical Assessment Cervical Assessment: (Lt rotation due to visual deficits) Thoracic Assessment Thoracic Assessment: Within Functional Limits Lumbar Assessment Lumbar Assessment: Within Functional Limits Postural Control Postural Control: Deficits on evaluation Righting Reactions: delayed  Balance Dynamic Standing Balance Dynamic Standing - Comments: max A Extremity Assessment      RLE  Assessment RLE Assessment: Within Functional Limits LLE Assessment General Strength Comments: grossly 3/5    Refer to Care Plan for Long Term Goals  Recommendations for other services: None   Discharge Criteria: Patient will be discharged from PT if patient refuses treatment 3 consecutive times without medical reason, if treatment goals not met, if there is a change in medical status, if patient makes no progress towards goals or if patient is discharged from hospital.  The above assessment, treatment plan, treatment alternatives and goals were discussed and mutually agreed upon: by patient  Nyelah Emmerich 08/19/2018, 10:38 AM

## 2018-08-19 NOTE — Progress Notes (Addendum)
[facility] PHYSICAL MEDICINE & REHABILITATION PROGRESS NOTE   Subjective/Complaints: Lying in bed. Denies pain.   ROS: Limited due to cognitive/behavioral    Objective:   No results found. Recent Labs    08/19/18 0508  WBC 6.5  HGB 12.8*  HCT 39.6  PLT 400   Recent Labs    08/17/18 0410 08/19/18 0508  NA 133* 136  K 3.7 3.6  CL 101 102  CO2 21* 24  GLUCOSE 96 97  BUN 12 8  CREATININE 0.71 0.79  CALCIUM 8.7* 8.9    Intake/Output Summary (Last 24 hours) at 08/19/2018 1047 Last data filed at 08/19/2018 0910 Gross per 24 hour  Intake 357 ml  Output -  Net 357 ml     Physical Exam: Vital Signs Blood pressure (!) 152/125, pulse (!) 104, temperature 98 F (36.7 C), temperature source Oral, resp. rate 18, height 5\' 8"  (1.727 m), weight 72.3 kg, SpO2 100 %. Constitutional: No distress . Vital signs reviewed. HEENT: EOMI, oral membranes moist, eyes deviated to left. Sclerae red Neck: supple Cardiovascular: RRR without murmur. No JVD    Respiratory: CTA Bilaterally without wheezes or rales. Normal effort    GI: BS +, non-tender, non-distended  Neurological: Follows basic commands. Responds to my basic queries in spanish appropriately.  Moves all 4's.  Psych: flat    Assessment/Plan: 1. 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  Physiatrist is providing close team supervision and 24 hour management of active medical problems listed below.  Physiatrist and rehab team continue to assess barriers to discharge/monitor patient progress toward functional and medical goals  Care Tool:  Bathing              Bathing assist       Upper Body Dressing/Undressing Upper body dressing        Upper body assist      Lower Body Dressing/Undressing Lower body dressing            Lower body assist       Toileting Toileting Toileting  Activity did not occur (Clothing management and hygiene only): N/A (no void or bm)(Admitted late and did not occur on shift)  Toileting assist Assist for toileting: Moderate Assistance - Patient 50 - 74%     Transfers Chair/bed transfer  Transfers assist     Chair/bed transfer assist level: Moderate Assistance - Patient 50 - 74%     Locomotion Ambulation   Ambulation assist      Assist level: Moderate Assistance - Patient 50 - 74% Assistive device: Hand held assist Max distance: 25   Walk 10 feet activity   Assist     Assist level: Moderate Assistance - Patient - 50 - 74%     Walk 50 feet activity   Assist Walk 50 feet with 2 turns activity did not occur: Safety/medical concerns         Walk 150 feet activity   Assist Walk 150 feet activity did not occur: Safety/medical concerns         Walk 10 feet on uneven surface  activity   Assist Walk 10 feet on uneven surfaces activity did not occur: Safety/medical concerns         Wheelchair     Assist Will patient use wheelchair at discharge?: No             Wheelchair 50 feet with 2 turns activity  Assist            Wheelchair 150 feet activity     Assist          Medical Problem List and Plan: 1.Decreased functional mobility with cognitive deficitssecondary to acute encephalopathy related toxoplasmosis in the setting of immunosuppression due to recently diagnosed HIV.   -beginning therapies today  -Follow-up per infectious disease.   -Currently completing a course of clindamycin and Mepron 2. DVT Prophylaxis/Anticoagulation: SCDs 3. Pain Management:Tylenol as needed 4. Mood:Provide emotional support 5. Neuropsych: This patientis notcapable of making decisions on hisown behalf. 6. Skin/Wound Care:Routine skin checks 7. Fluids/Electrolytes/Nutrition:  -I personally reviewed the patient's labs today.  -low albumin ---add supp   8.HIV. Follow-up per  infectious disease and await plan to begin anti-retroviral medications 9. Urinary retention. Voiding trial  - 10. History of polysubstance abuse. Urine drug screen negative 11. HTN: elevation this morning. Question reading. Will have nurse recheck    LOS: 1 days A FACE TO [place] 08/19/2018, 10:47 AM

## 2018-08-19 NOTE — Progress Notes (Signed)
INFECTIOUS DISEASE PROGRESS NOTE  ID: Thomas Park [name] is a 27 y.o. 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  Less agitated.   Abtx:  Anti-infectives (From admission, onward)   Start     Dose/Rate Route Frequency Ordered Stop   08/18/18 1930  sulfaDIAZINE tablet 1,500 mg     1,500 mg Oral Every 6 hours 08/18/18 1922        Medications:  Scheduled: . feeding supplement  1 Container Oral TID BM  . multivitamins with iron  1 tablet Oral Daily  . Pyrimethamine 50mg / Leucovirin 20mg   1 capsule Oral Daily   And  . Pyrimethamine 25mg  / Leucovorin 5mg    1 capsule Oral Daily  . sulfaDIAZINE  1,500 mg Oral Q6H  . thiamine  100 mg Oral Daily    Objective: Vital signs in last 24 hours: Temp:  [97.6 F (36.4 C)-98 F (36.7 C)] 98 F (36.7 C) (12/06 0551) Pulse Rate:  [90-110] 104 (12/06 0551) Resp:  [16-18] 18 (12/06 0551) BP: (115-152)/(74-125) 152/125 (12/06 0551) SpO2:  [97 %-100 %] 100 % (12/06 0551) Weight:  [72.3 kg] 72.3 kg (12/05 1746)   General appearance: alert and no distress Eyes: positive findings: sclera injected Resp: clear to auscultation bilaterally Cardio: regular rate and rhythm GI: normal findings: bowel sounds normal and soft, non-tender  Lab Results Recent Labs    08/17/18 0410 08/19/18 0508  WBC  --  6.5  HGB  --  12.8*  HCT  --  39.6  NA 133* 136  K 3.7 3.6  CL 101 102  CO2 21* 24  BUN 12 8  CREATININE 0.71 0.79   Liver Panel Recent Labs    08/19/18 0508  PROT 7.4  ALBUMIN 3.2*  AST 41  ALT 34  ALKPHOS 65  BILITOT 0.7   Sedimentation Rate No results for input(s): ESRSEDRATE in the last 72 hours. C-Reactive Protein No results for input(s): CRP in the last 72 hours.  Microbiology: Recent Results (from the past 240 hour(s))  Acid Fast Smear (AFB)     Status: None   Collection Time: 08/11/18 11:20 AM  Result Value  Ref Range Status   AFB Specimen Processing Concentration  Final   Acid Fast Smear Negative  Final    Comment: (NOTE) Performed At: [facility] [place], [place] [id number] Rush Farmer MD [id number]    Source (AFB) CSF  Final    Comment: Performed at [facility], [place] [address] [place], [place] [address]  Anaerobic culture     Status: None   Collection Time: 08/11/18 11:20 AM  Result Value Ref Range Status   Specimen Description CSF  Final   Special Requests NONE  Final   Culture   Final    NO ANAEROBES ISOLATED Performed at [facility], [place] [address] [place], [place] [address]    Report Status 08/15/2018 FINAL  Final  CSF culture     Status: None   Collection Time: 08/11/18 11:20 AM  Result Value Ref Range Status   Specimen Description CSF  Final   Special Requests NONE  Final   Gram Stain NO WBC SEEN NO ORGANISMS SEEN   Final   Culture   Final    NO GROWTH 3 DAYS Performed at [facility], [place] [address], [place], [place] [address]  Report Status 08/14/2018 FINAL  Final  Fungus Culture With Stain     Status: None (Preliminary result)   Collection Time: 08/11/18 11:20 AM  Result Value Ref Range Status   Fungus Stain Final report  Final    Comment: (NOTE) Performed At: [facility] [place], [place] [id number] Rush Farmer MD [id number]    Fungus (Mycology) Culture PENDING  Incomplete   Fungal Source CSF  Final    Comment: Performed at [facility], [place] [address], [place], [place] [address]  Andee Poles, PCR     Status: Abnormal   Collection Time: 08/11/18 11:20 AM  Result Value Ref Range Status   Toxoplasma Gondii, PCR Positive (A) Negative Final    Comment: (NOTE) Toxoplasma gondii DNA Detected. This test was developed and its performance characteristics determined by [employer]. It has not been cleared or approved by the U.S. Food and Drug Administration. The  FDA has determined that such clearance or approval is not necessary. This test is used for clinical purposes. It should not be regarded as investigational or research. Performed At: [facility] [place], [place] [id number] Rush Farmer MD KG:[id number]   Fungus Culture Result     Status: None   Collection Time: 08/11/18 11:20 AM  Result Value Ref Range Status   Result 1 Comment  Final    Comment: (NOTE) KOH/Calcofluor preparation:  no fungus observed. Performed At: [facility] [address] [place], [place] [id number] Rush Farmer MD [id number]   Culture, blood (Routine X 2) w Reflex to ID Panel     Status: None   Collection Time: 08/12/18 11:12 AM  Result Value Ref Range Status   Specimen Description BLOOD RIGHT ANTECUBITAL  Final   Special Requests   Final    BOTTLES DRAWN AEROBIC AND ANAEROBIC Blood Culture adequate volume   Culture   Final    NO GROWTH 5 DAYS Performed at [facility], [address], [place], [place] [address]    Report Status 08/17/2018 FINAL  Final  Culture, blood (Routine X 2) w Reflex to ID Panel     Status: None   Collection Time: 08/12/18 11:19 AM  Result Value Ref Range Status   Specimen Description BLOOD LEFT ANTECUBITAL  Final   Special Requests   Final    BOTTLES DRAWN AEROBIC AND ANAEROBIC Blood Culture adequate volume   Culture   Final    NO GROWTH 5 DAYS Performed at [facility], [place] [address] [place], [place] [address]    Report Status 08/17/2018 FINAL  Final    Studies/Results: No results found.   Assessment/Plan: AIDS CNS toxo  Total days of antibiotics: 9 (clinda-atovaqoune --> sulfadiazine/leucovorin/pyrimethamine day 1)  Explained to family that his conjunctival injection will improve Hold ART til ~ 2 weeks of toxo rx.  Appreciate pharm assistance with toxo rx Will need to watch for rash from sulfa (he has prev rash from bactrim) ID available as needed over w/e.           Bobby Rumpf MD, FACP Infectious Diseases (pager) [phone] [email or web address] 08/19/2018, 7:32 AM  LOS: 1 day

## 2018-08-20 ENCOUNTER — Inpatient Hospital Stay (HOSPITAL_COMMUNITY): Payer: Self-pay | Admitting: Occupational Therapy

## 2018-08-20 ENCOUNTER — Inpatient Hospital Stay (HOSPITAL_COMMUNITY): Payer: Self-pay | Admitting: Physical Therapy

## 2018-08-20 MED ORDER — NAPHAZOLINE-GLYCERIN 0.012-0.2 % OP SOLN
1.0000 [drp] | Freq: Four times a day (QID) | OPHTHALMIC | Status: DC | PRN
  Administered 2018-08-22 – 2018-08-27 (×8): 2 [drp] via OPHTHALMIC
  Administered 2018-08-27: 1 [drp] via OPHTHALMIC
  Administered 2018-08-30 – 2018-08-31 (×2): 2 [drp] via OPHTHALMIC
  Administered 2018-09-01 – 2018-09-02 (×2): 1 [drp] via OPHTHALMIC
  Filled 2018-08-20: qty 15

## 2018-08-20 MED ORDER — DIPHENHYDRAMINE HCL 12.5 MG/5ML PO ELIX
12.5000 mg | ORAL_SOLUTION | Freq: Four times a day (QID) | ORAL | Status: DC | PRN
  Administered 2018-08-20 – 2018-08-24 (×7): 25 mg via ORAL
  Filled 2018-08-20 (×6): qty 10

## 2018-08-20 NOTE — Progress Notes (Signed)
Patient refused to open his mouth this morning to take his antiviral. Communication left for the physician & on coming nurse. No acute distress noted. Family at the bedside.

## 2018-08-20 NOTE — Progress Notes (Signed)
Occupational Therapy Session Note  Patient Details  Name: Thomas Park MRN: [id number] Date of Birth: January 01, 1991  Today's Date: 08/20/2018 OT Individual Time: 1010-1105 OT Individual Time Calculation (min): 55 min   Short Term Goals: Week 1:  OT Short Term Goal 1 (Week 1): Pt will locate 1 grooming item at midline with max multimodal cues. OT Short Term Goal 2 (Week 1): Pt will complete toilet transfer with consistent min A OT Short Term Goal 3 (Week 1): Pt will complete bathing using L hand for 50% of task .  Skilled Therapeutic Interventions/Progress Updates:    Pt greeted in bed with interpretor and father present. Agreeable to shower. Supine<sit completed with steady assist and vcs for scooting forward EOB. Asked him to don socks and he lifted gripper sock into the air and waved it at ceiling. Pt elevating each LE with instruction while OT donned socks. Stand pivot<w/c<TTB completed with Mod A and multimodal cues for motor planning and head turns to scan for transfer surfaces. Steady assist for sitting balance during shower due to Lt lean and poor awareness. Able to correct for brief periods of time given cuing. Lateral and anterior leans completed for perihygiene. He required step by step cues for sequencing and Min assist for safely washing feet. He then dressed sit<stand at sink. Worked on pt bringing attention to midline using mirror. Clothing items placed Rt of midline to reinforce reaching and head turns, though he was not observed to scan to midline during session. Mod A sit<stand to elevate pants over hips. Cues provided for midline orientation in standing due to Lt lean. Oral care/grooming tasks completed w/c level with focus on initiation, awareness, at attention to Rt side; once again, step by step cuing required for execution of these motor tasks. At end of session he wanted to remain up in w/c. Safety belt fastened. Pt left with father and interpretor, anticipating next  therapist.   Therapy Documentation Precautions:  Precautions Precautions: Fall Restrictions Weight Bearing Restrictions: No   Vital Signs: Therapy Vitals Pulse Rate: (!) 115 BP: 121/86 Patient Position (if appropriate): Standing Pain: No c/o pain during session  Pain Assessment Pain Scale: 0-10 Pain Score: 0-No pain ADL:       Therapy/Group: Individual Therapy  Caroline Longie A Jamine Highfill 08/20/2018, 12:38 PM

## 2018-08-20 NOTE — Progress Notes (Signed)
Patient's family member told staff that patient wanted to go to the restroom. Nurse techs assisted patient to the restroom with family members assistance in instructing patient on what to do. It still was difficult to get the patient to sit down in the wheelchair & on the commode. He stood up a few times but did not use the toilet. While sitting patient was noted scratching in various places ( back, thigh, left arm). No rash noted but the skin is reddened where he has been scratching it. No open areas at this time. He has a new order for eucerin cream for dry skin, but the areas where he has been scratching do not look dry or scaly. Did notice him scratching a little last night but it seems excessive today. On call provider was called & an order was given for benadryl. Patient was assisted back to bed & medications were given as ordered. Family member assisted in med administration due to the patient taking a long time to take it. Eye drops were administered due to dryness & redness. No acute distress noted. Will continue to monitor for changes.

## 2018-08-20 NOTE — Progress Notes (Signed)
[facility] PHYSICAL MEDICINE & REHABILITATION PROGRESS NOTE   Subjective/Complaints: Father at bedside patient with limited English is able to state his name and follow basic commands  ROS: Limited due to cognitive/behavioral    Objective:   No results found. Recent Labs    08/19/18 0508  WBC 6.5  HGB 12.8*  HCT 39.6  PLT 400   Recent Labs    08/19/18 0508  NA 136  K 3.6  CL 102  CO2 24  GLUCOSE 97  BUN 8  CREATININE 0.79  CALCIUM 8.9    Intake/Output Summary (Last 24 hours) at 08/20/2018 1333 Last data filed at 08/20/2018 1300 Gross per 24 hour  Intake 717 ml  Output -  Net 717 ml     Physical Exam: Vital Signs Blood pressure 121/86, pulse (!) 115, temperature (!) 97.1 F (36.2 C), temperature source Axillary, resp. rate 18, height 5\' 8"  (1.727 m), weight 72.3 kg, SpO2 100 %. Constitutional: No distress . Vital signs reviewed. HEENT: EOMI, oral membranes moist, eyes deviated to left. Sclerae red Neck: supple Cardiovascular: RRR without murmur. No JVD    Respiratory: CTA Bilaterally without wheezes or rales. Normal effort    GI: BS +, non-tender, non-distended  Neurological: Follows basic commands.  Manual muscle testing grossly 4/5 bilateral deltoid bicep tricep hip flexor knee extensor ankle dorsiflexor Moves all 4's.  Psych: flat    Assessment/Plan: 1. 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  Physiatrist is providing close team supervision and 24 hour management of active medical problems listed below.  Physiatrist and rehab team continue to assess barriers to discharge/monitor patient progress toward functional and medical goals  Care Tool:  Bathing    Body parts bathed by patient: Right arm, Left arm, Chest, Abdomen, Front perineal area, Left upper leg, Right upper leg, Right lower leg, Left lower leg, Face, Buttocks   Body  parts bathed by helper: Buttocks     Bathing assist Assist Level: Moderate Assistance - Patient 50 - 74%     Upper Body Dressing/Undressing Upper body dressing   What is the patient wearing?: Pull over shirt    Upper body assist Assist Level: Minimal Assistance - Patient > 75%    Lower Body Dressing/Undressing Lower body dressing      What is the patient wearing?: Pants     Lower body assist Assist for lower body dressing: Moderate Assistance - Patient 50 - 74%     Toileting Toileting Toileting Activity did not occur (Clothing management and hygiene only): N/A (no void or bm)(Admitted late and did not occur on shift)  Toileting assist Assist for toileting: Moderate Assistance - Patient 50 - 74%     Transfers Chair/bed transfer  Transfers assist     Chair/bed transfer assist level: Moderate Assistance - Patient 50 - 74%     Locomotion Ambulation   Ambulation assist      Assist level: Moderate Assistance - Patient 50 - 74% Assistive device: Hand held assist Max distance: 25   Walk 10 feet activity   Assist     Assist level: Moderate Assistance - Patient - 50 - 74%     Walk 50 feet activity   Assist Walk 50 feet with 2 turns activity did not occur: Safety/medical concerns         Walk 150 feet activity   Assist Walk 150 feet activity did not occur: Safety/medical concerns  Walk 10 feet on uneven surface  activity   Assist Walk 10 feet on uneven surfaces activity did not occur: Safety/medical concerns         Wheelchair     Assist Will patient use wheelchair at discharge?: No             Wheelchair 50 feet with 2 turns activity    Assist            Wheelchair 150 feet activity     Assist          Medical Problem List and Plan: 1.Decreased functional mobility with cognitive deficitssecondary to acute encephalopathy related toxoplasmosis in the setting of immunosuppression due to recently  diagnosed HIV.   -PT OT speech  -Follow-up per infectious disease.   -Currently completing a course of clindamycin and Mepron 2. DVT Prophylaxis/Anticoagulation: SCDs 3. Pain Management:Tylenol as needed 4. Mood:Provide emotional support 5. Neuropsych: This patientis notcapable of making decisions on hisown behalf. 6. Skin/Wound Care:Routine skin checks 7. Fluids/Electrolytes/Nutrition:  -I personally reviewed the patient's labs today.  -low albumin ---add supp   8.HIV. Follow-up per infectious disease and await plan to begin anti-retroviral medications 9. Urinary retention. Voiding trial  - 10. History of polysubstance abuse. Urine drug screen negative 11. HTN: elevation this morning. Question reading. Will have nurse recheck  12.  Injected sclera will start Naphcon  LOS: 2 days A FACE TO FACE EVALUATION WAS PERFORMED  Charlett Blake 08/20/2018, 1:33 PM

## 2018-08-20 NOTE — Progress Notes (Signed)
Physical Therapy Session Note  Patient Details  Name: Thomas Park MRN: [id number] Date of Birth: [birth year]  Today's Date: 08/20/2018 PT Individual Time: 1100-1200 AND 1300-1415 PT Individual Time Calculation (min): 60 min 75 min   Short Term Goals: Week 1:  PT Short Term Goal 1 (Week 1): pt will perform functional transfers with min A PT Short Term Goal 2 (Week 1): pt will perform gait with min A x 50' in controlled environment  Skilled Therapeutic Interventions/Progress Updates:   Session 1 Pt received sitting in WC and agreeable to PT. Pt transported to day room in [facility]. BP assessed in sitting  BP 115/80 HR 102, standing 121/89 HR 115.   PT instructed pt in gait training with mod assist round day room x 4 with mod Assist from PT. On 3rd bout PT attempted to have pt to visually scan to midline to see and state number of fingers, and identify colored blocks while ambulating. Pt noted to have inconsistent response to numbers and colors and was noted to be unable to turn head far enough allow visualization of midline. [facility] Stand pivot transfer to mat table with mod assist to the L. Pt noted to spontaneously turn head to the R to see pillows on bed 3 times. When asked to repeat R cervical rotation, pt increased rotation to the L. Sit>supine in bed with min assist to control the L LE. PT instructed pt in cervical rotation and lateral flexion to the R with AAROM progressing to AROM with sustained stretch at end range from PT after each rep. Sitting balance EOB x 15 minutes while engaged in visual scanning task to locate 6 bug pieces on table. Pt able to locate 3 on the L side of midline with max cues for visual scanning. Unable to visualize 3 bugs on the L and noted to feel around on the table to locate. Gait training back to room x 196f with mod assist from PT and max cues for direction, posture, and step step length. [facility] and all needs met.     Session 2.   Pt received sitting in WC and agreeable to PT. [facility] x 2 LLE quadrant to force graded attention closer to midline. Dyn[employer]ision in standing x 2 L side. Max cues for visual scanning from left to near midline and hand over hand assist to press lights;  with min-mod assist for balance. Pt able to locate and press as many as 4 for each 1 min bout.  [facility] with mod assist from PT with multiple rolling tables placed in path. Pt reports seeing 3 of 4 tables and was able to navigate past 2 without additional problem solving cues from PT. Gait training with  Mod assist  And no AD 2 x 175; multimodal cues for upright posture, weight shifting, and forward gaze at target of interpreter. Standing balance to attempt to place bug pieces in cup, able to complete x 4 and then drecreased problem solving noted as pt poured tube of bug pieces on table. After short rest break Pt instructed in returning pieced back to container. Able to retrieve at place all bugs in container that were on the L side of midline while PT provided max multimodal cues a facilitation to scan to the R. [name]  reciprocal movement training x 5 minutes with max cues to visually scan to midline, maintain neutral midline alignment, and force simultaneous use of BUE, mod assist from PT to prevent LOB. Pt returned to room and performed mod assist stand pivot transfer to bed with moderate cues for safety. Sit>supine completed with supervision and left supine in bed with call bell in reach and all needs met         Therapy Documentation Precautions:  Precautions Precautions: Fall Restrictions Weight Bearing Restrictions: No  Therapy Vitals Temp: (!) 97.5 F (36.4 C) Temp Source: Axillary Pulse Rate: 89 Resp: 19 BP: 121/77 Patient Position (if appropriate): Lying Oxygen Therapy SpO2: 100 % O2  Device: Room Air Pain:   denies   Therapy/Group: Individual Therapy  Lorie Phenix 08/20/2018, 5:40 PM

## 2018-08-21 NOTE — Plan of Care (Signed)
  Problem: RH BLADDER ELIMINATION Goal: RH STG MANAGE BLADDER WITH ASSISTANCE Description STG Manage Bladder With mod Assistance  Outcome: Progressing   Problem: RH SKIN INTEGRITY Goal: RH STG SKIN FREE OF INFECTION/BREAKDOWN Description Patients skin will remain free from further infection or breakdown with min assist.  Outcome: Progressing Goal: RH STG MAINTAIN SKIN INTEGRITY WITH ASSISTANCE Description STG Maintain Skin Integrity With min Assistance.  Outcome: Progressing   Problem: RH SAFETY Goal: RH STG ADHERE TO SAFETY PRECAUTIONS W/ASSISTANCE/DEVICE Description STG Adhere to Safety Precautions With Mod/max Assistance/Device.  Outcome: Progressing

## 2018-08-21 NOTE — Progress Notes (Signed)
[facility] PHYSICAL MEDICINE & REHABILITATION PROGRESS NOTE   Subjective/Complaints: Sister at bedside, patient continues with left eye deviation but starting to look midline better today  ROS: Limited due to cognitive/behavioral    Objective:   No results found. Recent Labs    08/19/18 0508  WBC 6.5  HGB 12.8*  HCT 39.6  PLT 400   Recent Labs    08/19/18 0508  NA 136  K 3.6  CL 102  CO2 24  GLUCOSE 97  BUN 8  CREATININE 0.79  CALCIUM 8.9    Intake/Output Summary (Last 24 hours) at 08/21/2018 1107 Last data filed at 08/21/2018 0900 Gross per 24 hour  Intake 597 ml  Output 1800 ml  Net -1203 ml     Physical Exam: Vital Signs Blood pressure 116/88, pulse 81, temperature (!) 96.5 F (35.8 C), temperature source Axillary, resp. rate 18, height 5\' 8"  (1.727 m), weight 72.3 kg, SpO2 100 %. Constitutional: No distress . Vital signs reviewed. HEENT: EOMI, oral membranes moist, eyes deviated to left. Sclerae red but improved versus yesterday Neck: supple Cardiovascular: RRR without murmur. No JVD    Respiratory: CTA Bilaterally without wheezes or rales. Normal effort    GI: BS +, non-tender, non-distended  Neurological: Follows basic commands.  Manual muscle testing grossly 4/5 bilateral deltoid bicep tricep hip flexor knee extensor ankle dorsiflexor Able to maintain long sitting with minimal assistance Moves all 4's.  Psych: flat    Assessment/Plan: 1. 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  Physiatrist is providing close team supervision and 24 hour management of active medical problems listed below.  Physiatrist and rehab team continue to assess barriers to discharge/monitor patient progress toward functional and medical goals  Care Tool:  Bathing    Body parts bathed by patient: Right arm, Left arm, Chest, Abdomen, Front perineal  area, Left upper leg, Right upper leg, Right lower leg, Left lower leg, Face, Buttocks   Body parts bathed by helper: Buttocks     Bathing assist Assist Level: Moderate Assistance - Patient 50 - 74%     Upper Body Dressing/Undressing Upper body dressing   What is the patient wearing?: Pull over shirt    Upper body assist Assist Level: Minimal Assistance - Patient > 75%    Lower Body Dressing/Undressing Lower body dressing      What is the patient wearing?: Incontinence brief     Lower body assist Assist for lower body dressing: Moderate Assistance - Patient 50 - 74%     Toileting Toileting Toileting Activity did not occur (Clothing management and hygiene only): N/A (no void or bm)(Admitted late and did not occur on shift)  Toileting assist Assist for toileting: Moderate Assistance - Patient 50 - 74%     Transfers Chair/bed transfer  Transfers assist     Chair/bed transfer assist level: Moderate Assistance - Patient 50 - 74%     Locomotion Ambulation   Ambulation assist      Assist level: Moderate Assistance - Patient 50 - 74% Assistive device: Hand held assist Max distance: 221ft   Walk 10 feet activity   Assist     Assist level: Moderate Assistance - Patient - 50 - 74% Assistive device: Hand held assist   Walk 50 feet activity   Assist Walk 50 feet with 2 turns activity did not occur: Safety/medical concerns  Assist level: Moderate Assistance - Patient - 23 -  74% Assistive device: Hand held assist    Walk 150 feet activity   Assist Walk 150 feet activity did not occur: Safety/medical concerns  Assist level: Moderate Assistance - Patient - 50 - 74%      Walk 10 feet on uneven surface  activity   Assist Walk 10 feet on uneven surfaces activity did not occur: Safety/medical concerns         Wheelchair     Assist Will patient use wheelchair at discharge?: No             Wheelchair 50 feet with 2 turns  activity    Assist            Wheelchair 150 feet activity     Assist          Medical Problem List and Plan: 1.Decreased functional mobility with cognitive deficitssecondary to acute encephalopathy related toxoplasmosis in the setting of immunosuppression due to recently diagnosed HIV.   -PT OT speech, tolerating therapy  -Follow-up per infectious disease.   -Currently completing a course of clindamycin and Mepron 2. DVT Prophylaxis/Anticoagulation: SCDs 3. Pain Management:Tylenol as needed 4. Mood:Provide emotional support 5. Neuropsych: This patientis notcapable of making decisions on hisown behalf. 6. Skin/Wound Care:Routine skin checks 7. Fluids/Electrolytes/Nutrition:  -I personally reviewed the patient's labs today.  -low albumin ---add supp   8.HIV. Follow-up per infectious disease and await plan to begin anti-retroviral medications 9. Urinary retention. Voiding trial  - 10. History of polysubstance abuse. Urine drug screen negative 11. HTN: elevation this morning. Question reading. Will have nurse recheck  12.  Injected sclera will start Naphcon  LOS: 3 days A FACE TO FACE EVALUATION WAS PERFORMED  Charlett Blake 08/21/2018, 11:07 AM

## 2018-08-22 ENCOUNTER — Inpatient Hospital Stay (HOSPITAL_COMMUNITY): Payer: Self-pay | Admitting: Physical Therapy

## 2018-08-22 ENCOUNTER — Inpatient Hospital Stay (HOSPITAL_COMMUNITY): Payer: Self-pay | Admitting: Speech Pathology

## 2018-08-22 ENCOUNTER — Inpatient Hospital Stay (HOSPITAL_COMMUNITY): Payer: Self-pay | Admitting: Occupational Therapy

## 2018-08-22 NOTE — Progress Notes (Signed)
Physical Therapy Session Note  Patient Details  Name: Thomas Park MRN: [id number] Date of Birth: [birth year]  Today's Date: 08/22/2018 PT Individual Time: 0925-1035 PT Individual Time Calculation (min): 70 min   Short Term Goals: Week 1:  PT Short Term Goal 1 (Week 1): pt will perform functional transfers with min A PT Short Term Goal 2 (Week 1): pt will perform gait with min A x 50' in controlled environment  Skilled Therapeutic Interventions/Progress Updates:    pt in bed agreeable to PT.  Pt performs transfers throughout session with min A.  Gait training with HHA on Lt with mod A due to requiring mod manual facilitation for balance and path finding due to visual deficits.  Seated and standing ball toss and ball kick with pt able to toss and kick ball in midline appropriately, continues with Lt cervical rotation and Lt gaze throughout.  Pt mod A for standing balnace with ball kick and close supervision for sitting balance due to impulsivity.  dynavision with focus on following Lt hand to touch the lights. Pt able to turn head towards Lt UE 40% of trials with manual and verbal cues.  Pt requires hand over hand assist for reaching with Lt UE.  Pt left in room with pipe tree to "keep busy".  Pt's father educated on strategies to decrease restlessness. Pt left in room with alarm set, family present, needs at hand.  Therapy Documentation Precautions:  Precautions Precautions: Fall Restrictions Weight Bearing Restrictions: No Pain: Pain Assessment Pain Scale: 0-10 Pain Score: 0-No pain    Therapy/Group: Individual Therapy  [name] 08/22/2018, 10:38 AM

## 2018-08-22 NOTE — Progress Notes (Signed)
[facility] PHYSICAL MEDICINE & REHABILITATION PROGRESS NOTE   Subjective/Complaints: No new issues over weekend.   ROS: limited due to language/communication   Objective:   No results found. No results for input(s): WBC, HGB, HCT, PLT in the last 72 hours. No results for input(s): NA, K, CL, CO2, GLUCOSE, BUN, CREATININE, CALCIUM in the last 72 hours.  Intake/Output Summary (Last 24 hours) at 08/22/2018 1038 Last data filed at 08/22/2018 0815 Gross per 24 hour  Intake 478 ml  Output 1850 ml  Net -1372 ml     Physical Exam: Vital Signs Blood pressure 136/88, pulse (!) 101, temperature 98 F (36.7 C), resp. rate 18, height 5\' 8"  (1.727 m), weight 72.3 kg, SpO2 100 %. Constitutional: No distress . Vital signs reviewed. HEENT: EOMI, oral membranes moist, sclerae red Neck: supple Cardiovascular: RRR without murmur. No JVD    Respiratory: CTA Bilaterally without wheezes or rales. Normal effort    GI: BS +, non-tender, non-distended  Neurological: Follows basic commands.  Manual muscle testing grossly 4/5 bilateral deltoid bicep tricep hip flexor knee extensor ankle dorsiflexor--no changes Able to maintain long sitting with minimal assistance    Psych: flat    Assessment/Plan: 1. 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  Physiatrist is providing close team supervision and 24 hour management of active medical problems listed below.  Physiatrist and rehab team continue to assess barriers to discharge/monitor patient progress toward functional and medical goals  Care Tool:  Bathing    Body parts bathed by patient: Right arm, Left arm, Chest, Abdomen, Front perineal area, Left upper leg, Right upper leg, Right lower leg, Left lower leg, Face, Buttocks   Body parts bathed by helper: Buttocks     Bathing assist Assist Level: Moderate Assistance - Patient 50  - 74%     Upper Body Dressing/Undressing Upper body dressing   What is the patient wearing?: Pull over shirt    Upper body assist Assist Level: Minimal Assistance - Patient > 75%    Lower Body Dressing/Undressing Lower body dressing      What is the patient wearing?: Incontinence brief     Lower body assist Assist for lower body dressing: Moderate Assistance - Patient 50 - 74%     Toileting Toileting Toileting Activity did not occur (Clothing management and hygiene only): N/A (no void or bm)(Admitted late and did not occur on shift)  Toileting assist Assist for toileting: Moderate Assistance - Patient 50 - 74%     Transfers Chair/bed transfer  Transfers assist     Chair/bed transfer assist level: Moderate Assistance - Patient 50 - 74%     Locomotion Ambulation   Ambulation assist      Assist level: Moderate Assistance - Patient 50 - 74% Assistive device: Hand held assist Max distance: 274ft   Walk 10 feet activity   Assist     Assist level: Moderate Assistance - Patient - 50 - 74% Assistive device: Hand held assist   Walk 50 feet activity   Assist Walk 50 feet with 2 turns activity did not occur: Safety/medical concerns  Assist level: Moderate Assistance - Patient - 50 - 74% Assistive device: Hand held assist    Walk 150 feet activity   Assist Walk 150 feet activity did not occur: Safety/medical concerns  Assist level: Moderate Assistance - Patient - 50 - 74%      Walk 10 feet on uneven  surface  activity   Assist Walk 10 feet on uneven surfaces activity did not occur: Safety/medical concerns         Wheelchair     Assist Will patient use wheelchair at discharge?: No             Wheelchair 50 feet with 2 turns activity    Assist            Wheelchair 150 feet activity     Assist          Medical Problem List and Plan: 1.Decreased functional mobility with cognitive deficitssecondary to acute  encephalopathy related toxoplasmosis in the setting of immunosuppression due to recently diagnosed HIV.   -PT OT speech, tolerating therapy  -Follow-up per infectious disease.   -Currently completing a course of clindamycin and Mepron 2. DVT Prophylaxis/Anticoagulation: SCDs 3. Pain Management:Tylenol as needed 4. Mood:Provide emotional support 5. Neuropsych: This patientis notcapable of making decisions on hisown behalf. 6. Skin/Wound Care:Routine skin checks 7. Fluids/Electrolytes/Nutrition:  Encourage PO  -low albumin ---added supp   8.HIV. Follow-up per infectious disease and await plan to begin anti-retroviral medications 9. Urinary retention. Voiding trial  -req I/O caths 10. History of polysubstance abuse. Urine drug screen negative 12.  Injected sclera  Naphcon  LOS: 4 days A FACE TO FACE EVALUATION WAS PERFORMED  Meredith Staggers 08/22/2018, 10:38 AM

## 2018-08-22 NOTE — Progress Notes (Signed)
Occupational Therapy Session Note  Patient Details  Name: Thomas Park MRN: [id number] Date of Birth: March 14, 1991  Today's Date: 08/22/2018 OT Individual Time: 1300-1415 OT Individual Time Calculation (min): 75 min   Short Term Goals: Week 1:  OT Short Term Goal 1 (Week 1): Pt will locate 1 grooming item at midline with max multimodal cues. OT Short Term Goal 2 (Week 1): Pt will complete toilet transfer with consistent min A OT Short Term Goal 3 (Week 1): Pt will complete bathing using L hand for 50% of task .  Skilled Therapeutic Interventions/Progress Updates:    Pt greeted seated in wc with interpreter and father present. Asked pt if he needed to use the restroom before showering and he stated yes. Stand-pivot to toilet on the R side with max multimodal cues, and mod A to facilitate pivot. Pt with successful BM. Pt tried to stand before performing hygiene and needed max cues to return to sitting. Max A for peri-care. Stand-pivot onto tub bench on R side with similar assistance needed. Utilized water positioning to help bring pt to midline as he has severe lean to the L and head turn to the L. Pt able to follow commands to wash body parts but needed min/mod A to maintain balance with lateral lean. Engaged pt in dressing tasks on the R side to try to bring pt to midline, but he was unable to bring head/neck to midline. Pt brought to therapy gym and OT utilized mirror feedback with number finding activity to help with body awareness and positioning. Pt unable to scan to locate numbers despite max multimodal cues and hand over hand A. Pt very distracted by itching as well. Engaged pt in ball toss activity with pt able to catch ball from straight ahead. Had pt lean down onto R elbow to try to decrease lean to the L. Pt held position for 1 minute x3. Pt returned to room and left seated in wc with safety belt on and father present.   Therapy Documentation Precautions:   Precautions Precautions: Fall Restrictions Weight Bearing Restrictions: No Pain: None/denies pain  Therapy/Group: Individual Therapy  Valma Cava 08/22/2018, 3:34 PM

## 2018-08-22 NOTE — Progress Notes (Signed)
   INFECTIOUS DISEASE PROGRESS NOTE  ID: Thomas Park is a 27 y.o. 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   Abtx:  Anti-infectives (From admission, onward)   Start     Dose/Rate Route Frequency Ordered Stop   08/18/18 1930  sulfaDIAZINE tablet 1,500 mg     1,500 mg Oral Every 6 hours 08/18/18 1922        Medications:  Scheduled: . feeding supplement  1 Container Oral TID BM  . multivitamins with iron  1 tablet Oral Daily  . Pyrimethamine 50mg / Leucovirin 20mg   1 capsule Oral Daily   And  . Pyrimethamine 25mg  / Leucovorin 5mg    1 capsule Oral Daily  . sulfaDIAZINE  1,500 mg Oral Q6H  . thiamine  100 mg Oral Daily    Objective: Vital signs in last 24 hours: Temp:  [98 F (36.7 C)-98.4 F (36.9 C)] 98.4 F (36.9 C) (12/09 1454) Pulse Rate:  [98-101] 98 (12/09 1454) Resp:  [16-18] 16 (12/09 1454) BP: (120-136)/(79-88) 120/79 (12/09 1454) SpO2:  [100 %] 100 % (12/09 1454)   General appearance: mild distress and syndromic appearance - encephalopathic Eyes: positive findings: conjunctiva: 4+ injection Resp: clear to auscultation bilaterally Cardio: regular rate and rhythm GI: normal findings: bowel sounds normal and soft, non-tender  Lab Results No results for input(s): WBC, HGB, HCT, NA, K, CL, CO2, BUN, CREATININE, GLU in the last 72 hours.  Invalid input(s): PLATELETS Liver Panel No results for input(s): PROT, ALBUMIN, AST, ALT, ALKPHOS, BILITOT, BILIDIR, IBILI in the last 72 hours. Sedimentation Rate No results for input(s): ESRSEDRATE in the last 72 hours. C-Reactive Protein No results for input(s): CRP in the last 72 hours.  Microbiology: No results found for this or any previous visit (from the past 240 hour(s)).  Studies/Results: No results found.   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  Could consider repeat CT head next 48h Explained to his sister that his confusion is not improving and may not improve.  Hold on ART til ~ 12-11         Bobby Rumpf MD, FACP Infectious Diseases (pager) [phone] [email or web address] 08/22/2018, 6:48 PM  LOS: 4 days

## 2018-08-22 NOTE — Progress Notes (Signed)
Speech Language Pathology Daily Session Note  Patient Details  Name: Thomas Park MRN: [id number] Date of Birth: May 27, 1991  Today's Date: 08/22/2018 SLP Individual Time: 0301-3143 SLP Individual Time Calculation (min): 55 min  Short Term Goals: Week 1: SLP Short Term Goal 1 (Week 1): Pt will orient to place and date with max verbal cues.   SLP Short Term Goal 2 (Week 1): Pt will sustain his attention to basic, functional tasks for 5 minute intervals with mod assist cues for redirection.   SLP Short Term Goal 3 (Week 1): Pt will complete basic, familiar tasks with max assist for functional problem solving.   SLP Short Term Goal 4 (Week 1): Pt will recall basic, daily information with max assist verbal cues.    Skilled Therapeutic Interventions: Skilled treatment session focused on cognitive goals. SLP facilitated session by providing Min-Mod A verbal and tactile cues for patient to follow commands during a basic dressing task (donning brief and shorts). Patient's father present and concerned patient did not eat enough breakfast. With Min encouragement and total A for self-feeding patient consumed his cereal without overt s/s of aspiration. Patient required total A for orientation to place and month but unable to utilize visual aids despite Max A multimodal cues. Patient able to turn head past midline to the right X 3 and Max A verbal cues throughout session. Patient required Max A multimodal cues for sustained attention to tasks and was distracted by constant itching. RN aware and administered medications. Patient left upright in bed with alarm on and family present. Continue with current plan of care.      Pain Pain Assessment Pain Scale: 0-10 Pain Score: 0-No pain  Therapy/Group: Individual Therapy  Kajah Santizo 08/22/2018, 12:29 PM

## 2018-08-23 ENCOUNTER — Inpatient Hospital Stay (HOSPITAL_COMMUNITY): Payer: Self-pay | Admitting: Speech Pathology

## 2018-08-23 ENCOUNTER — Inpatient Hospital Stay (HOSPITAL_COMMUNITY): Payer: Self-pay | Admitting: Occupational Therapy

## 2018-08-23 ENCOUNTER — Inpatient Hospital Stay (HOSPITAL_COMMUNITY): Payer: Self-pay | Admitting: Physical Therapy

## 2018-08-23 DIAGNOSIS — R339 Retention of urine, unspecified: Secondary | ICD-10-CM

## 2018-08-23 DIAGNOSIS — H547 Unspecified visual loss: Secondary | ICD-10-CM

## 2018-08-23 MED ORDER — BETHANECHOL CHLORIDE 10 MG PO TABS
10.0000 mg | ORAL_TABLET | Freq: Three times a day (TID) | ORAL | Status: DC
  Administered 2018-08-23 (×3): 10 mg via ORAL
  Filled 2018-08-23 (×3): qty 1

## 2018-08-23 NOTE — Progress Notes (Signed)
Patient has a generalized rash. He has been scratching his back, legs & arms excessively. He does have prn Eucerin cream & benadryl ordered but has now developed this rash with small papules. Both medications were given. Communication left for provider. Will continue to monitor for changes & try to provide some relief.

## 2018-08-23 NOTE — Plan of Care (Signed)
  Problem: Consults Goal: RH BRAIN INJURY PATIENT EDUCATION Description Description: See Patient Education module for eduction specifics Outcome: Progressing Goal: Skin Care Protocol Initiated - if Braden Score 18 or less Description If consults are not indicated, leave blank or document N/A Outcome: Progressing   Problem: RH BLADDER ELIMINATION Goal: RH STG MANAGE BLADDER WITH MEDICATION WITH ASSISTANCE Description STG Manage Bladder With Medication With mod Assistance.  Outcome: Progressing   Problem: RH SKIN INTEGRITY Goal: RH STG SKIN FREE OF INFECTION/BREAKDOWN Description Patients skin will remain free from further infection or breakdown with min assist.  Outcome: Progressing

## 2018-08-23 NOTE — Progress Notes (Signed)
Occupational Therapy Session Note  Patient Details  Name: Thomas Park MRN: [id number] Date of Birth: 06-28-1991  Today's Date: 08/23/2018 OT Individual Time: 7902-4097 OT Individual Time Calculation (min): 60 min    Short Term Goals: Week 1:  OT Short Term Goal 1 (Week 1): Pt will locate 1 grooming item at midline with max multimodal cues. OT Short Term Goal 2 (Week 1): Pt will complete toilet transfer with consistent min A OT Short Term Goal 3 (Week 1): Pt will complete bathing using L hand for 50% of task .  Skilled Therapeutic Interventions/Progress Updates:    OT session today with interpreter and sister present.  Patient denies pain.  He is pleasant and cooperative, occ smiles and receptive to correction.    ADL:  LB dressing S level in bed, oral care - CG and assist for set up in stance Functional transfers and short distance ambulation:  CG and tactile cues without AD UB motor activity - able to achieve full ROM - R is delayed and requires cues to reach potential (fatigues with repetition) Visual motor - B eyes with extreme left gaze preference (head rotated to left)  - No neck ROM deficits - with tactile and verbal cues patient able to hold head in midline and to right but requires ongoing cues to maintain.  Eyes are grossly symmetrical, activities completed binocular and monocular with eye patch - nystagmus noted at times, able to sustain gaze briefly at approximately 45 degrees from left - (unable to achieve midline or right gaze) - Has a tendency to move head and maintain the far left eye position but will respond with ongoing cues.  Able to place objects on the right side with moderate accuracy utilizing peripheral vision.  He is able to identify family members in phone pictures (he notes that vision is clear in left eye but he is unable to see with right eye - ongoing acuity assessment needed).  Attention to task if fair, Recall is fair.   Patient returned to bed for  a brief rest break before next therapy session with sister and interpreter present, Alarm set.    Therapy Documentation Precautions:  Precautions Precautions: Fall Restrictions Weight Bearing Restrictions: No General:   Vital Signs:   Pain: Pain Assessment Pain Scale: 0-10 Pain Score: 0-No pain Faces Pain Scale: No hurt   Therapy/Group: Individual Therapy  Carlos Levering 08/23/2018, 12:06 PM

## 2018-08-23 NOTE — Progress Notes (Signed)
Physical Therapy Session Note  Patient Details  Name: Thomas Park MRN: [id number] Date of Birth: [birth year]  Today's Date: 08/23/2018 PT Individual Time: 1000-1040 PT Individual Time Calculation (min): 40 min   Short Term Goals: Week 1:  PT Short Term Goal 1 (Week 1): pt will perform functional transfers with min A PT Short Term Goal 2 (Week 1): pt will perform gait with min A x 50' in controlled environment  Skilled Therapeutic Interventions/Progress Updates:    pt performs gait throughout unit initially with min A with HHA, mod A when fatigued.  Improved midline posture and balance during gait this session.  Standing balance with basketball toss with focus on head coming to midline and righting reactions.  Pt requires min/mod A for balance, max/total A for head coming to midline.  Seated reaching task with focus on eyes and head coming to midline, head able to come to midline 50% of trials, eyes unable to come to midline this session despite max cuing.  Floor transfer with mod A due to near fall when coming to stand due to Lt LOB.  Quadruped with focus on midline head positioning with alternate UE and LE lifts with mod A.  Pt left in bed with family present, alarm set, needs at hand.  Therapy Documentation Precautions:  Precautions Precautions: Fall Restrictions Weight Bearing Restrictions: No Pain: Pain Assessment Pain Scale: 0-10 Pain Score: 0-No pain Faces Pain Scale: No hurt    Therapy/Group: Individual Therapy  DONAWERTH,KAREN 08/23/2018, 10:41 AM

## 2018-08-23 NOTE — Progress Notes (Signed)
[facility] PHYSICAL MEDICINE & REHABILITATION PROGRESS NOTE   Subjective/Complaints: Vomited some of breakfast this morning. States he does not have belly pain or feel nauseas now. Does not want any medication for stomach  ROS: Limited due to cognitive/behavioral    Objective:   No results found. No results for input(s): WBC, HGB, HCT, PLT in the last 72 hours. No results for input(s): NA, K, CL, CO2, GLUCOSE, BUN, CREATININE, CALCIUM in the last 72 hours.  Intake/Output Summary (Last 24 hours) at 08/23/2018 0913 Last data filed at 08/23/2018 0252 Gross per 24 hour  Intake 168 ml  Output 2144 ml  Net -1976 ml     Physical Exam: Vital Signs Blood pressure 113/70, pulse 97, temperature 98.4 F (36.9 C), temperature source Oral, resp. rate 15, height 5\' 8"  (1.727 m), weight 72.3 kg, SpO2 100 %. Constitutional: No distress . Vital signs reviewed. HEENT: EOMI, oral membranes moist. Sclerae less irritated Neck: supple Cardiovascular: RRR without murmur. No JVD    Respiratory: CTA Bilaterally without wheezes or rales. Normal effort    GI: BS +, non-tender, non-distended  Neurological: Follows all commands when communicated in spanish.  Manual muscle testing at least 4/5 bilateral deltoid bicep tricep hip flexor knee extensor ankle dorsiflexor--no changes Able to maintain long sitting with minimal assistance    Psych: flat    Assessment/Plan: 1. 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  Physiatrist is providing close team supervision and 24 hour management of active medical problems listed below.  Physiatrist and rehab team continue to assess barriers to discharge/monitor patient progress toward functional and medical goals  Care Tool:  Bathing    Body parts bathed by patient: Right arm, Left arm, Chest, Abdomen, Front perineal area, Left upper leg,  Right upper leg, Right lower leg, Left lower leg, Face, Buttocks   Body parts bathed by helper: Buttocks     Bathing assist Assist Level: Moderate Assistance - Patient 50 - 74%     Upper Body Dressing/Undressing Upper body dressing   What is the patient wearing?: Pull over shirt    Upper body assist Assist Level: Minimal Assistance - Patient > 75%    Lower Body Dressing/Undressing Lower body dressing      What is the patient wearing?: Incontinence brief     Lower body assist Assist for lower body dressing: Moderate Assistance - Patient 50 - 74%     Toileting Toileting Toileting Activity did not occur (Clothing management and hygiene only): N/A (no void or bm)(Admitted late and did not occur on shift)  Toileting assist Assist for toileting: Moderate Assistance - Patient 50 - 74%     Transfers Chair/bed transfer  Transfers assist     Chair/bed transfer assist level: Minimal Assistance - Patient > 75%     Locomotion Ambulation   Ambulation assist      Assist level: Moderate Assistance - Patient 50 - 74% Assistive device: Hand held assist Max distance: 277ft   Walk 10 feet activity   Assist     Assist level: Moderate Assistance - Patient - 50 - 74% Assistive device: Hand held assist   Walk 50 feet activity   Assist Walk 50 feet with 2 turns activity did not occur: Safety/medical concerns  Assist level: Moderate Assistance - Patient - 50 - 74% Assistive device: Hand held assist    Walk 150 feet activity   Assist Walk 150 feet activity  did not occur: Safety/medical concerns  Assist level: Moderate Assistance - Patient - 50 - 74%      Walk 10 feet on uneven surface  activity   Assist Walk 10 feet on uneven surfaces activity did not occur: Safety/medical concerns         Wheelchair     Assist Will patient use wheelchair at discharge?: No             Wheelchair 50 feet with 2 turns activity    Assist             Wheelchair 150 feet activity     Assist          Medical Problem List and Plan: 1.Decreased functional mobility with cognitive deficitssecondary to acute encephalopathy related toxoplasmosis in the setting of immunosuppression due to recently diagnosed HIV.   -Interdisciplinary Team Conference today    -Follow-up per infectious disease.   -Currently completing a course of clindamycin and Mepron 2. DVT Prophylaxis/Anticoagulation: SCDs 3. Pain Management:Tylenol as needed 4. Mood:Provide emotional support 5. Neuropsych: This patientis notcapable of making decisions on hisown behalf. 6. Skin/Wound Care:Routine skin checks 7. Fluids/Electrolytes/Nutrition:  Encourage PO  - supp  For hypalbuminemia 8.HIV. Follow-up per infectious disease and await plan to begin anti-retroviral medications 9. Urinary retention. Voiding trial  -voiding some on his own  -I/O caths prn  -add urecholine 10mg  tid to help with emptying 10. History of polysubstance abuse. Urine drug screen negative 12.  Injected sclera  Naphcon  LOS: 5 days A FACE TO FACE EVALUATION WAS PERFORMED  Meredith Staggers 08/23/2018, 9:13 AM

## 2018-08-23 NOTE — Care Management (Signed)
Inpatient Rehabilitation Center Individual Statement of Services  Patient Name:  Thomas Park [facility]  Date:  08/23/2018  Welcome to the [facility].  Our goal is to provide you with an individualized program based on your diagnosis and situation, designed to meet your specific needs.  With this comprehensive rehabilitation program, you will be expected to participate in at least 3 hours of rehabilitation therapies Monday-Friday, with modified therapy programming on the weekends.  Your rehabilitation program will include the following services:  Physical Therapy (PT), Occupational Therapy (OT), Speech Therapy (ST), 24 hour per day rehabilitation nursing, Therapeutic Recreaction (TR), Neuropsychology, Case Management (Social Worker), Rehabilitation Medicine, Nutrition Services, Airline pilot and Pharmacy Services  Weekly team conferences will be held on Tuesdays to discuss your progress.  Your Social Worker will talk with you frequently to get your input and to update you on team discussions.  Team conferences with you and your family in attendance may also be held.  Expected length of stay: 14-17 days   Overall anticipated outcome: supervision  Depending on your progress and recovery, your program may change. Your Social Worker will coordinate services and will keep you informed of any changes. Your Social Worker's name and contact numbers are listed  below.  The following services may also be recommended but are not provided by the [facility] will be made to provide these services after discharge if needed.  Arrangements include referral to agencies that provide these services.  Your insurance has been verified to be:  None  Your primary doctor is: None  Pertinent information will be shared with  your doctor and your insurance company.  Social Worker:  [place], [place] or [phone]   Information discussed with and copy given to patient by: Lennart Pall, 08/23/2018, 1:37 PM

## 2018-08-23 NOTE — Progress Notes (Signed)
Occupational Therapy Session Note  Patient Details  Name: Thomas Park MRN: [id number] Date of Birth: [birth year]  Today's Date: 08/23/2018 OT Individual Time: 1300-1359 OT Individual Time Calculation (min): 59 min   Short Term Goals: Week 1:  OT Short Term Goal 1 (Week 1): Pt will locate 1 grooming item at midline with max multimodal cues. OT Short Term Goal 2 (Week 1): Pt will complete toilet transfer with consistent min A OT Short Term Goal 3 (Week 1): Pt will complete bathing using L hand for 50% of task .  Skilled Therapeutic Interventions/Progress Updates:    Pt greeted semi-reclined in bed and agreeable to OT treatment session. Sister and interpreter present throughout session. Dynavision activity with focus on visual scanning towards midline to locate lights. Pt able to hit 3/20 lights on L side with max multimodal cues. Had wc slightly turned to the R  2/2 gaze preference. Standing cup stacking activity in midline at counter height. Multimodal cues to initially to understand concept. Stretching and reaching across body with guided A from OT.  Soccer ball kicking seated in wc. Pt able to kick ball at midline 60% of the time. Pt returned to room and completed stand pivot back to bed with min A. Pt left semi-reclined with bed alarm on and family present.   Therapy Documentation Precautions:  Precautions Precautions: Fall Restrictions Weight Bearing Restrictions: No Pain: Pain Assessment Pain Scale: 0-10 Pain Score: 0-No pain   Therapy/Group: Individual Therapy  Valma Cava 08/23/2018, 2:01 PM

## 2018-08-23 NOTE — Progress Notes (Signed)
Subjective:  Seems improved from when I saw him in early December, November he is awake and looking leftward  Antibiotics:  Anti-infectives (From admission, onward)   Start     Dose/Rate Route Frequency Ordered Stop   08/18/18 1930  sulfaDIAZINE tablet 1,500 mg     1,500 mg Oral Every 6 hours 08/18/18 1922        Medications: Scheduled Meds: . bethanechol  10 mg Oral TID  . feeding supplement  1 Container Oral TID BM  . multivitamins with iron  1 tablet Oral Daily  . Pyrimethamine 50mg / Leucovirin 20mg   1 capsule Oral Daily   And  . Pyrimethamine 25mg  / Leucovorin 5mg    1 capsule Oral Daily  . sulfaDIAZINE  1,500 mg Oral Q6H  . thiamine  100 mg Oral Daily   Continuous Infusions:  PRN Meds:.acetaminophen **OR** acetaminophen, diphenhydrAMINE, hydrocerin, naphazoline-glycerin, ondansetron **OR** ondansetron (ZOFRAN) IV, polyvinyl alcohol, sorbitol    Objective: Weight change:   Intake/Output Summary (Last 24 hours) at 08/23/2018 1302 Last data filed at 08/23/2018 1100 Gross per 24 hour  Intake 290 ml  Output 2144 ml  Net -1854 ml   Blood pressure 113/70, pulse 97, temperature 98.4 F (36.9 C), temperature source Oral, resp. rate 15, height 5\' 8"  (1.727 m), weight 72.3 kg, SpO2 100 %. Temp:  [98.4 F (36.9 C)] 98.4 F (36.9 C) (12/09 2057) Pulse Rate:  [97-98] 97 (12/10 0253) Resp:  [15-16] 15 (12/10 0253) BP: (113-120)/(70-96) 113/70 (12/10 0253) SpO2:  [100 %] 100 % (12/10 0253)  Physical Exam: General: Confused but able to answer some questions. HEENT: anicteric sclera, EOMI, injected conjunctiva CVS regular rate, normal  Chest: , no wheezing, no respiratory distress Abdomen: soft non-distended,  Extremities: no edema or deformity noted bilaterally Neuro no new focal deficits  CBC:    BMET No results for input(s): NA, K, CL, CO2, GLUCOSE, BUN, CREATININE, CALCIUM in the last 72 hours.   Liver Panel  No results for input(s): PROT,  ALBUMIN, AST, ALT, ALKPHOS, BILITOT, BILIDIR, IBILI in the last 72 hours.     Sedimentation Rate No results for input(s): ESRSEDRATE in the last 72 hours. C-Reactive Protein No results for input(s): CRP in the last 72 hours.  Micro Results: Recent Results (from the past 720 hour(s))  Acid Fast Smear (AFB)     Status: None   Collection Time: 08/11/18 11:20 AM  Result Value Ref Range Status   AFB Specimen Processing Concentration  Final   Acid Fast Smear Negative  Final    Comment: (NOTE) Performed At: [facility] [place], [place] [id number] Rush Farmer MD ES:[id number]    Source (AFB) CSF  Final    Comment: Performed at [facility], [place] [address] [place], [place] [address]  Anaerobic culture     Status: None   Collection Time: 08/11/18 11:20 AM  Result Value Ref Range Status   Specimen Description CSF  Final   Special Requests NONE  Final   Culture   Final    NO ANAEROBES ISOLATED Performed at [facility], [place] [address] [place],  [address]    Report Status 08/15/2018 FINAL  Final  CSF culture     Status: None   Collection Time: 08/11/18 11:20 AM  Result Value Ref Range Status   Specimen Description CSF  Final   Special Requests NONE  Final   Gram Stain NO WBC SEEN NO ORGANISMS SEEN  Final   Culture   Final    NO GROWTH 3 DAYS Performed at [facility], [place] [address], [place], [place] [address]    Report Status 08/14/2018 FINAL  Final  Fungus Culture With Stain     Status: None (Preliminary result)   Collection Time: 08/11/18 11:20 AM  Result Value Ref Range Status   Fungus Stain Final report  Final    Comment: (NOTE) Performed At: [facility] [place], [place] [id number] Rush Farmer MD GX:[id number]    Fungus (Mycology) Culture PENDING  Incomplete   Fungal Source CSF  Final    Comment: Performed at [facility], [place] [address], [place], [place] [address]  Andee Poles, PCR     Status: Abnormal   Collection Time: 08/11/18 11:20 AM  Result Value Ref Range Status   Toxoplasma Gondii, PCR Positive (A) Negative Final    Comment: (NOTE) Toxoplasma gondii DNA Detected. This test was developed and its performance characteristics determined by [employer]. It has not been cleared or approved by the U.S. Food and Drug Administration. The FDA has determined that such clearance or approval is not necessary. This test is used for clinical purposes. It should not be regarded as investigational or research. Performed At: [facility] [name], [place] [id number] Rush Farmer MD YO:[id number]   Fungus Culture Result     Status: None   Collection Time: 08/11/18 11:20 AM  Result Value Ref Range Status   Result 1 Comment  Final    Comment: (NOTE) KOH/Calcofluor preparation:  no fungus observed. Performed At: [facility] [address] [place], [place] [id number] Rush Farmer MD EH:[id number]   Culture, blood (Routine X 2) w Reflex to ID Panel     Status: None   Collection Time: 08/12/18 11:12 AM  Result Value Ref Range Status   Specimen Description BLOOD RIGHT ANTECUBITAL  Final   Special Requests   Final    BOTTLES DRAWN AEROBIC AND ANAEROBIC Blood Culture adequate volume   Culture   Final    NO GROWTH 5 DAYS Performed at [facility], [address] [place], [place] [address]    Report Status 08/17/2018 FINAL  Final  Culture, blood (Routine X 2) w Reflex to ID Panel     Status: None   Collection Time: 08/12/18 11:19 AM  Result Value Ref Range Status   Specimen Description BLOOD LEFT ANTECUBITAL  Final   Special Requests   Final    BOTTLES DRAWN AEROBIC AND ANAEROBIC Blood Culture adequate volume   Culture   Final    NO GROWTH 5 DAYS Performed at [facility], [place] [address] [place],  [address]    Report Status 08/17/2018 FINAL  Final    Studies/Results: No results  found.    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  He also has peripheral vision loss  #1 Toxoplasmosis of CNS with Encephalitis:  Continue sulfadiazine and pyrimethamine  Very apprehensive about starting antiretrovirals in this patient as I think he is at extremely high risk for immune reconstitution syndrome which  could be catastrophic for him   2. HIV and AIDS: Above discussion.  Would also like to get him his HIV medication assistance program filled out while he is in the hospital so that he can obtain medications when he leaves through that program potentially.  Otherwise will need to procure medications through pharmaceutical assistance.   Bedside Spanish interpreter was available through out and provided translation for my discussions with the patient and his sister.    LOS: 5 days   Alcide Evener 08/23/2018, 1:02 PM

## 2018-08-23 NOTE — Progress Notes (Signed)
Speech Language Pathology Daily Session Note  Patient Details  Name: Lex Linhares MRN: [id number] Date of Birth: 10-Oct-1990  Today's Date: 08/23/2018 SLP Individual Time: 0935-1000 SLP Individual Time Calculation (min): 25 min  Short Term Goals: Week 1: SLP Short Term Goal 1 (Week 1): Pt will orient to place and date with max verbal cues.   SLP Short Term Goal 2 (Week 1): Pt will sustain his attention to basic, functional tasks for 5 minute intervals with mod assist cues for redirection.   SLP Short Term Goal 3 (Week 1): Pt will complete basic, familiar tasks with max assist for functional problem solving.   SLP Short Term Goal 4 (Week 1): Pt will recall basic, daily information with max assist verbal cues.    Skilled Therapeutic Interventions:  Pt was seen for skilled ST targeting cognitive goals.  Pt needed max cues/choice of three to reorient to place but total cues to reorient to date.  Attempted use of calendar for ongoing diagnostic treatment of visual function and use of external aids for orientation.  Pt needed hand over hand cues for use of finger tracking to identify numbers on calendar but suspect that pt was largely guessing and/or using contextual cues for number identification rather than locating them visually.  Pt reported after task, "that was complicated."  SLP provided skilled instruction regarding nature of task difficulty in the setting of significant attention and memory deficits.  Pt slightly restless as therapist was leaving and attempted to get out of bed unassisted but was redirected and left resting in bed with bed alarm set.  Continue per current plan of care.    Pain Pain Assessment Pain Scale: 0-10 Pain Score: 0-No pain Faces Pain Scale: No hurt  Therapy/Group: Individual Therapy  Laini Urick, Selinda Orion 08/23/2018, 10:21 AM

## 2018-08-24 ENCOUNTER — Inpatient Hospital Stay (HOSPITAL_COMMUNITY): Payer: Self-pay | Admitting: Physical Therapy

## 2018-08-24 ENCOUNTER — Inpatient Hospital Stay (HOSPITAL_COMMUNITY): Payer: Self-pay | Admitting: Speech Pathology

## 2018-08-24 ENCOUNTER — Inpatient Hospital Stay (HOSPITAL_COMMUNITY): Payer: Self-pay

## 2018-08-24 DIAGNOSIS — Z79899 Other long term (current) drug therapy: Secondary | ICD-10-CM

## 2018-08-24 DIAGNOSIS — R21 Rash and other nonspecific skin eruption: Secondary | ICD-10-CM

## 2018-08-24 DIAGNOSIS — L270 Generalized skin eruption due to drugs and medicaments taken internally: Secondary | ICD-10-CM

## 2018-08-24 MED ORDER — DIPHENHYDRAMINE HCL 12.5 MG/5ML PO ELIX
25.0000 mg | ORAL_SOLUTION | Freq: Four times a day (QID) | ORAL | Status: DC | PRN
  Administered 2018-08-24 – 2018-08-31 (×13): 25 mg via ORAL
  Filled 2018-08-24 (×14): qty 10

## 2018-08-24 MED ORDER — CLINDAMYCIN HCL 300 MG PO CAPS
600.0000 mg | ORAL_CAPSULE | Freq: Four times a day (QID) | ORAL | Status: DC
  Administered 2018-08-24 – 2018-09-02 (×35): 600 mg via ORAL
  Filled 2018-08-24 (×37): qty 2

## 2018-08-24 NOTE — Patient Care Conference (Signed)
Inpatient RehabilitationTeam Conference and Plan of Care Update Date: 08/23/2018   Time: 2:40 PM    Patient Name: Thomas Park Record Number: [id number]  Date of Birth: Sep 11, 1991 Sex: Male         Room/Bed: 4W09C/4W09C-01 Payor Info: Payor: MEDICAID POTENTIAL / Plan: MEDICAID POTENTIAL / Product Type: *No Product type* /    Admitting Diagnosis: Encephalitis  Admit Date/Time:  08/18/2018  5:22 PM Admission Comments: No comment available   Primary Diagnosis:  <principal problem not specified> Principal Problem: <principal problem not specified>  Patient Active Problem List   Diagnosis Date Noted  . Neurologic gait disorder   . Brain lesion   . Confusion   . Hyponatremia   . Substance abuse ([place])   . Cerebritis 08/10/2018  . AIDS ([place])   . CNS toxoplasmosis [facility])     Expected Discharge Date: Expected Discharge Date: 09/02/18  Team Members Present: Physician leading conference: Dr. 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  Patient with improving mobility.  Still impaired cognitively.  ID following patient for multiple considerations.    Ongoing medical management to maximize physical capacities      Bowel/Bladder   continenet of bowel & bladder, now has to be in/out cathed intermittently, LBM 08/23/18  regain ability to urinate  continue to encourage to void & cath as needed   Swallow/Nutrition/ Hydration             ADL's   Min/mod A overall for BADL tasks and transfers  supervision overall  visual scanning, positioning, modified bathing/dressing, attention,    Mobility   mod A transfers and gait with HHA  supervision overall   attention, visual deficits, balance   Communication             Safety/Cognition/ Behavioral Observations  max to total assist  mod assist   ongoing diagnostic treatment of vision, attention, memory, problem solving, basic cognition    Pain   no signs of pain or discomfort, has tylenol prn  pain scale <4  assess & treat as needed   Skin   has dry skin & a generalized rash, scratches excessively, has order for eucerin cream & benadryl  no new areas of skin break down  assess q shift    Rehab Goals Patient on target to meet rehab goals: Yes *See Care Plan and progress notes for long and short-term goals.     Barriers to Discharge  Current Status/Progress Possible Resolutions Date Resolved   Physician    Medical stability        Treat notable infectious disease considerations      Nursing  Medical stability;Medication compliance;Behavior               PT                    OT                  SLP                SW  Discharge Planning/Teaching Needs:  Plan home with sister and other family members able to provide 24/7 supervision  Teaching ongoing with assistance of interpreter   Team Discussion:  Significant cognitive impairment with strong left gaze - prognosis is guarded for visual improvement dependent on response to treatment for HIV.  Poor memory, carry over, recall and orientation.  Currently min-mod assist ADLs and mod for ambuation with supervision goals.    Revisions to Treatment Plan:  NA    Continued Need for Acute Rehabilitation Level of Care: The patient requires daily medical management by a physician with specialized training in physical medicine and rehabilitation for the following conditions: Daily direction of a multidisciplinary physical rehabilitation program to ensure safe treatment while eliciting the highest outcome that is of practical value to the patient.: Yes Daily medical management of patient stability for increased activity during  participation in an intensive rehabilitation regime.: Yes Daily analysis of laboratory values and/or radiology reports with any subsequent need for medication adjustment of medical intervention for : Neurological problems;Other   I attest that I was present, lead the team conference, and concur with the assessment and plan of the team.   Quinlan Vollmer 08/24/2018, 9:24 AM

## 2018-08-24 NOTE — Progress Notes (Signed)
Nutrition Follow-up  INTERVENTION:   - Continue Boost Breeze po TID, each supplement provides 250 kcal and 9 grams of protein  - Continue MVI with iron daily  - Took pt's food preferences  - Double protein with all meals  - Yogurt TID with all meals  NUTRITION DIAGNOSIS:   Increased nutrient needs related to chronic illness as evidenced by estimated needs.  Ongoing  GOAL:   Patient will meet greater than or equal to 90% of their needs  Progressing  MONITOR:   PO intake, Supplement acceptance, Labs, Weight trends  REASON FOR ASSESSMENT:   Malnutrition Screening Tool    ASSESSMENT:   27 yo male, admitted to rehab with CNS cytotoxoplasmosis. PMH significant for polysubstance abuse, recent HIV dx. Hx obtained from family at bedside. Transferred to rehab 12/5.  Discussed pt with RN who reports pt is eating ~50% of most meals and taking Boost Breeze supplements with family encouragement.  Spoke with pt and sister at bedside using [employer] interpreting services Shona Simpson [phone]). Pt's sister reports that pt completed 100% of breakfast meal but that the food was not enough and portions were small. Obtained food preferences (yogurt, fruit) and ordered double protein portions with all meals. Pt's sister confirms that pt is drinking Boost Breeze supplements with encouragement and reassurance from family.  Noted snacks in pt's room that pt's sister states are brought from home (juices, chips, etc.). Pt's sister states that pt snacks on these items throughout the day. RD provided encouragement to pt regarding PO intake and supplement acceptance.  Meal Completion: 40-100%  Medications reviewed and include: Boost Breeze TID (pt accepting ~50%), MVI with iron daily, thiamine  Labs reviewed.  UOP: 2200 ml x 24 hours  Diet Order:   Diet Order            Diet regular Room service appropriate? Yes; Fluid consistency: Thin; Fluid restriction: 1500 mL Fluid  Diet effective now               EDUCATION NEEDS:   Not appropriate for education at this time  Skin:  Skin Assessment: Reviewed RN Assessment  Last BM:  12/10 (small type 4)  Height:   Ht Readings from Last 1 Encounters:  08/18/18 5\' 8"  (1.727 m)    Weight:   Wt Readings from Last 1 Encounters:  08/18/18 72.3 kg    Ideal Body Weight:  70 kg  BMI:  Body mass index is 24.24 kg/m.  Estimated Nutritional Needs:   Kcal:  2100-2300  Protein:  100-115 grams  Fluid:  per MD    Gaynell Face, MS, RD, LDN Inpatient Clinical Dietitian Pager: [phone] Weekend/After Hours: [phone]

## 2018-08-24 NOTE — Plan of Care (Signed)
  Problem: Consults Goal: Nutrition Consult-if indicated Outcome: Progressing   Problem: RH BOWEL ELIMINATION Goal: RH STG MANAGE BOWEL WITH ASSISTANCE Description STG Manage Bowel with min Assistance.  Outcome: Progressing   Problem: RH SAFETY Goal: RH STG ADHERE TO SAFETY PRECAUTIONS W/ASSISTANCE/DEVICE Description STG Adhere to Safety Precautions With Mod/max Assistance/Device.  Outcome: Not Progressing   Problem: RH COGNITION-NURSING Goal: RH STG ANTICIPATES NEEDS/CALLS FOR ASSIST W/ASSIST/CUES Description STG Anticipates Needs/Calls for Assist With mod Assistance/Cues.  Outcome: Not Progressing   Problem: RH Vision Goal: RH LTG Vision (Specify) Outcome: Progressing

## 2018-08-24 NOTE — Progress Notes (Signed)
Subjective:  Developed an intensely pruritic rash Antibiotics:  Anti-infectives (From admission, onward)   Start     Dose/Rate Route Frequency Ordered Stop   08/24/18 1200  clindamycin (CLEOCIN) capsule 600 mg     600 mg Oral Every 6 hours 08/24/18 0930     08/18/18 1930  sulfaDIAZINE tablet 1,500 mg  Status:  Discontinued     1,500 mg Oral Every 6 hours 08/18/18 1922 08/24/18 0930      Medications: Scheduled Meds: . clindamycin  600 mg Oral Q6H  . feeding supplement  1 Container Oral TID BM  . multivitamins with iron  1 tablet Oral Daily  . Pyrimethamine 50mg / Leucovirin 20mg   1 capsule Oral Daily   And  . Pyrimethamine 25mg  / Leucovorin 5mg    1 capsule Oral Daily  . thiamine  100 mg Oral Daily   Continuous Infusions:  PRN Meds:.acetaminophen **OR** acetaminophen, diphenhydrAMINE, hydrocerin, naphazoline-glycerin, ondansetron **OR** ondansetron (ZOFRAN) IV, polyvinyl alcohol, sorbitol    Objective: Weight change:   Intake/Output Summary (Last 24 hours) at 08/24/2018 1141 Last data filed at 08/24/2018 0900 Gross per 24 hour  Intake 840 ml  Output 1800 ml  Net -960 ml   Blood pressure 126/79, pulse 96, temperature 98 F (36.7 C), temperature source Oral, resp. rate 19, height 5\' 8"  (1.727 m), weight 72.3 kg, SpO2 100 %. Temp:  [98 F (36.7 C)-98.1 F (36.7 C)] 98 F (36.7 C) (12/10 2001) Pulse Rate:  [96-98] 96 (12/10 2001) Resp:  [19-20] 19 (12/10 2001) BP: (121-126)/(79-82) 126/79 (12/10 2001) SpO2:  [100 %] 100 % (12/10 2001)  Physical Exam: General: Confused but able to answer some questions. HEENT: anicteric sclera, he is constantly gazing left occasionally able to move more towards midline but with staccato-like movements CVS regular rate, normal  Chest: , no wheezing, no respiratory distress Abdomen: soft non-distended,  Extremities: no edema or deformity noted bilaterally Neuro no new focal deficits  Use rash see picture  08/24/2018:      CBC:    BMET No results for input(s): NA, K, CL, CO2, GLUCOSE, BUN, CREATININE, CALCIUM in the last 72 hours.   Liver Panel  No results for input(s): PROT, ALBUMIN, AST, ALT, ALKPHOS, BILITOT, BILIDIR, IBILI in the last 72 hours.     Sedimentation Rate No results for input(s): ESRSEDRATE in the last 72 hours. C-Reactive Protein No results for input(s): CRP in the last 72 hours.  Micro Results: Recent Results (from the past 720 hour(s))  Acid Fast Smear (AFB)     Status: None   Collection Time: 08/11/18 11:20 AM  Result Value Ref Range Status   AFB Specimen Processing Concentration  Final   Acid Fast Smear Negative  Final    Comment: (NOTE) Performed At: [facility] [place], [place] [id number] Rush Farmer MD DX:[id number]    Source (AFB) CSF  Final    Comment: Performed at [facility], [place] [address] [place], [place] [address]  Anaerobic culture     Status: None   Collection Time: 08/11/18 11:20 AM  Result Value Ref Range Status   Specimen Description CSF  Final   Special Requests NONE  Final   Culture   Final    NO ANAEROBES ISOLATED Performed at [facility], [place] [address], [place], [place] [address]    Report Status 08/15/2018 FINAL  Final  CSF culture     Status: None   Collection Time:  08/11/18 11:20 AM  Result Value Ref Range Status   Specimen Description CSF  Final   Special Requests NONE  Final   Gram Stain NO WBC SEEN NO ORGANISMS SEEN   Final   Culture   Final    NO GROWTH 3 DAYS Performed at [facility], [place] [address], [place], [place] [address]    Report Status 08/14/2018 FINAL  Final  Fungus Culture With Stain     Status: None (Preliminary result)   Collection Time: 08/11/18 11:20 AM  Result Value Ref Range Status   Fungus Stain Final report  Final    Comment: (NOTE) Performed At: [facility] [place], [place] [id number] Rush Farmer MD  KN:[id number]    Fungus (Mycology) Culture PENDING  Incomplete   Fungal Source CSF  Final    Comment: Performed at [facility], [place] [address], [place], [place] [address]  Andee Poles, PCR     Status: Abnormal   Collection Time: 08/11/18 11:20 AM  Result Value Ref Range Status   Toxoplasma Gondii, PCR Positive (A) Negative Final    Comment: (NOTE) Toxoplasma gondii DNA Detected. This test was developed and its performance characteristics determined by [employer]. It has not been cleared or approved by the U.S. Food and Drug Administration. The FDA has determined that such clearance or approval is not necessary. This test is used for clinical purposes. It should not be regarded as investigational or research. Performed At: [facility] [place], [place] [id number] Rush Farmer MD ME:[id number]   Fungus Culture Result     Status: None   Collection Time: 08/11/18 11:20 AM  Result Value Ref Range Status   Result 1 Comment  Final    Comment: (NOTE) KOH/Calcofluor preparation:  no fungus observed. Performed At: [facility] [address] [place], [place] [id number] Rush Farmer MD HE:[id number]   Culture, blood (Routine X 2) w Reflex to ID Panel     Status: None   Collection Time: 08/12/18 11:12 AM  Result Value Ref Range Status   Specimen Description BLOOD RIGHT ANTECUBITAL  Final   Special Requests   Final    BOTTLES DRAWN AEROBIC AND ANAEROBIC Blood Culture adequate volume   Culture   Final    NO GROWTH 5 DAYS Performed at [facility], [address], [place], [place] [address]    Report Status 08/17/2018 FINAL  Final  Culture, blood (Routine X 2) w Reflex to ID Panel     Status: None   Collection Time: 08/12/18 11:19 AM  Result Value Ref Range Status   Specimen Description BLOOD LEFT ANTECUBITAL  Final   Special Requests   Final    BOTTLES DRAWN AEROBIC AND ANAEROBIC Blood Culture adequate volume   Culture    Final    NO GROWTH 5 DAYS Performed at [facility], [place] [address], [name], [place] [address]    Report Status 08/17/2018 FINAL  Final    Studies/Results: No results found.    Assessment/Plan:  INTERVAL HISTORY:   He has developed a rash likely due to the sulfadiazine.  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  He also has peripheral  vision loss  #1 Toxoplasmosis of CNS with Encephalitis:  DC sulfadiazene and place with clindamycin and continued pyrimethamine  Very apprehensive about starting antiretrovirals in this patient as I think he is at extremely high risk for immune reconstitution syndrome which could be catastrophic for him   2. HIV and AIDS: Above discussion.   We will ask Sharyn Lull from [facility] to see pt to apply for EMERGENCY ADAP so his meds will be covered BEFORE he leaves the hospital   Bedside Spanish interpreter was available through out and provided translation for my discussions with the patient and his sister.  We spent greater than 35  minutes with the patient including greater than 50% of time in face to face counsel of the patient and his sister regarding his care and in coordination of his care with our team at [facility].    LOS: 6 days   Alcide Evener 08/24/2018, 11:41 AM

## 2018-08-24 NOTE — Progress Notes (Signed)
Physical Therapy Session Note  Patient Details  Name: Thomas Park MRN: [id number] Date of Birth: March 15, 1991  Today's Date: 08/24/2018 PT Individual Time: 1300-1415 PT Individual Time Calculation (min): 75 min   Short Term Goals: Week 1:  PT Short Term Goal 1 (Week 1): pt will perform functional transfers with min A PT Short Term Goal 2 (Week 1): pt will perform gait with min A x 50' in controlled environment  Skilled Therapeutic Interventions/Progress Updates:     Patient received in bed, very pleasant and willing to participate in PT; professional interpreter present and facilitated communication today, sister observed session as well. Able to complete bed mobility with min guard today, initially required ModA for transfers to chair but this faded to [name] with ongoing practice and remained at [name] for majority of session. Attempted playing soccer in standing however required MaxA for balance and was very unsteady with L trunk lean, modified activity to seated soccer and seated catching activities to improve midline attention and orientation. Able to self-propel WC approximately 65f with Max cues for navigation due to drift to L, [place] and transferred to [name] with [place] riding [name] 5 minutes at level 4, then asked to stop due to fatigue. MinA/Max cues and manual facilitation to transfer back to WC. Transported him to day room and then worked on static standing balance which he was able to maintain with Min guard, then attempted side stepping, able to perform with min guard to R but MaxA due to poor coordination L LE to left. Also attempted stepping activity with L LE to improve coordination and motor control, performed 2 reps before patient asked to stop due to fear of falling. Max cues and manual facilitation for head turn to midline throughout session, however did note patient spontaneously turning head all the way to the right multiple  times during session although eyes remained to the L. MinA for transfer back to bed. He was left in bed with all needs met and family present, bed alarm active and all questions/concerns addressed.   Therapy Documentation Precautions:  Precautions Precautions: Fall Restrictions Weight Bearing Restrictions: No General:   Pain: Pain Assessment Pain Scale: 0-10 Pain Score: 0-No pain    Therapy/Group: Individual Therapy  KDeniece ReePT, DPT, CBIS  Supplemental Physical Therapist [facility]   Pager [phone] Rehab Office [phone]  08/24/2018, 2:26 PM

## 2018-08-24 NOTE — Progress Notes (Addendum)
[place] PHYSICAL MEDICINE & REHABILITATION PROGRESS NOTE   Subjective/Complaints: Developed rash over last 12 hours, diffuse. Pt states it "itches"  ROS: Limited due to language/cog  Objective:   No results found. No results for input(s): WBC, HGB, HCT, PLT in the last 72 hours. No results for input(s): NA, K, CL, CO2, GLUCOSE, BUN, CREATININE, CALCIUM in the last 72 hours.  Intake/Output Summary (Last 24 hours) at 08/24/2018 0923 Last data filed at 08/24/2018 0154 Gross per 24 hour  Intake 600 ml  Output 2200 ml  Net -1600 ml     Physical Exam: Vital Signs Blood pressure 126/79, pulse 96, temperature 98 F (36.7 C), temperature source Oral, resp. rate 19, height 5\' 8"  (1.727 m), weight 72.3 kg, SpO2 100 %. Constitutional: No distress . Vital signs reviewed. HEENT: EOMI, oral membranes moist, sclerae are improving Neck: supple Cardiovascular: RRR without murmur. No JVD    Respiratory: CTA Bilaterally without wheezes or rales. Normal effort    GI: BS +, non-tender, non-distended  Neurological:eyes still deviated to left. Follows all commands when communicated in [place].  Manual muscle testing at least 4/5 bilateral deltoid bicep tricep hip flexor knee extensor ankle dorsiflexor--no changes Skin: diffuse maculopapular rash     Psych: flat    Assessment/Plan: 1. 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  Physiatrist is providing close team supervision and 24 hour management of active medical problems listed below.  Physiatrist and rehab team continue to assess barriers to discharge/monitor patient progress toward functional and medical goals  Care Tool:  Bathing    Body parts bathed by patient: Right arm, Left arm, Chest, Abdomen, Front perineal area, Left upper leg, Right upper leg, Right lower leg, Left lower leg, Face, Buttocks   Body parts  bathed by helper: Buttocks     Bathing assist Assist Level: Moderate Assistance - Patient 50 - 74%     Upper Body Dressing/Undressing Upper body dressing   What is the patient wearing?: Pull over shirt    Upper body assist Assist Level: Minimal Assistance - Patient > 75%    Lower Body Dressing/Undressing Lower body dressing      What is the patient wearing?: Pants     Lower body assist Assist for lower body dressing: Supervision/Verbal cueing     Toileting Toileting Toileting Activity did not occur (Clothing management and hygiene only): N/A (no void or bm)(Admitted late and did not occur on shift)  Toileting assist Assist for toileting: Moderate Assistance - Patient 50 - 74%     Transfers Chair/bed transfer  Transfers assist     Chair/bed transfer assist level: Minimal Assistance - Patient > 75%     Locomotion Ambulation   Ambulation assist      Assist level: Moderate Assistance - Patient 50 - 74% Assistive device: Hand held assist Max distance: 229ft   Walk 10 feet activity   Assist     Assist level: Moderate Assistance - Patient - 50 - 74% Assistive device: Hand held assist   Walk 50 feet activity   Assist Walk 50 feet with 2 turns activity did not occur: Safety/medical concerns  Assist level: Moderate Assistance - Patient - 50 - 74% Assistive device: Hand held assist    Walk 150 feet activity   Assist Walk 150 feet activity did not occur: Safety/medical concerns  Assist level: Moderate Assistance - Patient - 50 - 74%  Walk 10 feet on uneven surface  activity   Assist Walk 10 feet on uneven surfaces activity did not occur: Safety/medical concerns         Wheelchair     Assist Will patient use wheelchair at discharge?: No             Wheelchair 50 feet with 2 turns activity    Assist            Wheelchair 150 feet activity     Assist          Medical Problem List and Plan: 1.Decreased  functional mobility with cognitive deficitssecondary to acute encephalopathy related toxoplasmosis in the setting of immunosuppression due to recently diagnosed HIV.   -Continue CIR therapies including PT, OT    -Follow-up per infectious disease.   -Currently completing a course of clindamycin and Mepron 2. DVT Prophylaxis/Anticoagulation: SCDs 3. Pain Management:Tylenol as needed 4. Mood:Provide emotional support 5. Neuropsych: This patientis notcapable of making decisions on hisown behalf. 6. Skin/Wound Care:likely drug related  -urecholine was started yesterday---I stopped this morning  -benadryl 25mg  q6 prn  -d/w ID re: abx 7. Fluids/Electrolytes/Nutrition:  Encourage PO  - supp  For hypalbuminemia 8.HIV. Follow-up per infectious disease and await plan to begin anti-retroviral medications 9. Urinary retention. Voiding trial  -voiding some on his own  -I/O caths prn  -hold urecholine as above 10. History of polysubstance abuse. Urine drug screen negative 12.  Injected sclera  Naphcon helping  LOS: 6 days A FACE TO FACE EVALUATION WAS PERFORMED  Meredith Staggers 08/24/2018, 9:23 AM

## 2018-08-24 NOTE — Progress Notes (Signed)
Occupational Therapy Session Note  Patient Details  Name: Thomas Park MRN: [id number] Date of Birth: [birth year]  Today's Date: 08/24/2018 OT Individual Time: 0930-1030 OT Individual Time Calculation (min): 60 min    Short Term Goals: Week 1:  OT Short Term Goal 1 (Week 1): Pt will locate 1 grooming item at midline with max multimodal cues. OT Short Term Goal 2 (Week 1): Pt will complete toilet transfer with consistent min A OT Short Term Goal 3 (Week 1): Pt will complete bathing using L hand for 50% of task .  Skilled Therapeutic Interventions/Progress Updates:    Session focused on visual scanning, attention to R side, and dynamic standing balance. Pt received supine in bed with translator and sister present, no c/o pain. Pt transferred into w/c with manual facilitation required for UE placement, min A overall. Pt completed bimanual propulsion of w/c to therapy gym with moderate cueing and HOH to facilitate proper technique. Pt stood in ADL kitchen with min-mod A and reached overhead into cabinets with verbal and auditory cueing for scanning to R visual field. Pt then transitioned to side lying on the L on mat and through max multimodal cueing was able to briefly scan to midline and to R visual field several times. Lastly, pt used dynavision to facilitate R visual scanning and head turn, with positioning of pt directly anterior and on the R side to encourage max head turning. By end of session pt spontaneously turned head to R multiple times for several seconds, but his eyes remained with strong L gaze preference. Pt was returned to his room and left sitting up in w/c with chair alarm belt fastened and all needs met.   Therapy Documentation Precautions:  Precautions Precautions: Fall Restrictions Weight Bearing Restrictions: No    Pain: Pain Assessment Pain Scale: 0-10 Pain Score: 0-No pain Faces Pain Scale: No hurt      Therapy/Group: Individual Therapy  Curtis Sites 08/24/2018, 12:08 PM

## 2018-08-24 NOTE — Progress Notes (Signed)
Social Work Patient ID: Thomas Park, male   DOB: 1991-07-01, 27 y.o.   MRN: [id number]  Met with pt and sister, [name], today to review team conference via interpreter assist.  Sister aware and agreeable with targeted d/c date of 12/20 and supervision goals overall.  She confirms that family will be sharing the 24/7 coverage.  Discussed f/u therapies and potential DME that I will assist with.  Sister without any questions at this time.  [name], LCSW

## 2018-08-24 NOTE — Progress Notes (Signed)
Speech Language Pathology Daily Session Note  Patient Details  Name: Jesper Stirewalt MRN: [id number] Date of Birth: 1991-03-08  Today's Date: 08/24/2018 SLP Individual Time: 8979-1504 SLP Individual Time Calculation (min): 55 min  Short Term Goals: Week 1: SLP Short Term Goal 1 (Week 1): Pt will orient to place and date with max verbal cues.   SLP Short Term Goal 2 (Week 1): Pt will sustain his attention to basic, functional tasks for 5 minute intervals with mod assist cues for redirection.   SLP Short Term Goal 3 (Week 1): Pt will complete basic, familiar tasks with max assist for functional problem solving.   SLP Short Term Goal 4 (Week 1): Pt will recall basic, daily information with max assist verbal cues.    Skilled Therapeutic Interventions: Skilled treatment session focused on cognitive goals. SLP facilitated session by providing Max A multimodal cues for patient to locate 1 to 3 items on his table to work on functional goal of feeding himself. Patient required Max A multimodal cues to utilize visual scanning and relied on feeling for the items rather than trying to look at them. Patient able to feed himself 2 bites of applesauce with Max A multimodal cues for both scanning and attention. Patient left upright in bed with alarm on and all needs within reach. Continue with current plan of care.      Pain Pain Assessment Pain Scale: 0-10 Pain Score: 0-No pain  Therapy/Group: Individual Therapy  Jahmiyah Dullea 08/24/2018, 3:03 PM

## 2018-08-25 ENCOUNTER — Inpatient Hospital Stay (HOSPITAL_COMMUNITY): Payer: Self-pay | Admitting: Occupational Therapy

## 2018-08-25 ENCOUNTER — Inpatient Hospital Stay (HOSPITAL_COMMUNITY): Payer: Self-pay | Admitting: Physical Therapy

## 2018-08-25 ENCOUNTER — Telehealth: Payer: Self-pay | Admitting: *Deleted

## 2018-08-25 ENCOUNTER — Inpatient Hospital Stay (HOSPITAL_COMMUNITY): Payer: Self-pay | Admitting: Speech Pathology

## 2018-08-25 DIAGNOSIS — B20 Human immunodeficiency virus [HIV] disease: Secondary | ICD-10-CM

## 2018-08-25 LAB — MISC LABCORP TEST (SEND OUT): Labcorp test code: 9985

## 2018-08-25 MED ORDER — BICTEGRAVIR-EMTRICITAB-TENOFOV 50-200-25 MG PO TABS: 1.0000 | ORAL_TABLET | Freq: Every day | ORAL | 1 refills | Status: DC

## 2018-08-25 MED ORDER — CAMPHOR-MENTHOL 0.5-0.5 % EX LOTN
TOPICAL_LOTION | CUTANEOUS | Status: DC | PRN
  Administered 2018-08-25 – 2018-08-30 (×8): via TOPICAL
  Filled 2018-08-25 (×2): qty 222

## 2018-08-25 MED ORDER — PREDNISONE 20 MG PO TABS
60.0000 mg | ORAL_TABLET | Freq: Every day | ORAL | Status: AC
  Administered 2018-08-25 – 2018-08-27 (×3): 60 mg via ORAL
  Filled 2018-08-25 (×3): qty 3

## 2018-08-25 MED ORDER — BICTEGRAVIR-EMTRICITAB-TENOFOV 50-200-25 MG PO TABS
1.0000 | ORAL_TABLET | Freq: Every day | ORAL | Status: DC
  Administered 2018-08-25 – 2018-09-02 (×9): 1 via ORAL
  Filled 2018-08-25 (×10): qty 1

## 2018-08-25 NOTE — Progress Notes (Addendum)
Subjective: Rash is worse. Antibiotics:  Anti-infectives (From admission, onward)   Start     Dose/Rate Route Frequency Ordered Stop   08/25/18 1300  bictegravir-emtricitabine-tenofovir AF (BIKTARVY) 50-200-25 MG per tablet 1 tablet     1 tablet Oral Daily 08/25/18 1050     08/24/18 1200  clindamycin (CLEOCIN) capsule 600 mg     600 mg Oral Every 6 hours 08/24/18 0930     08/18/18 1930  sulfaDIAZINE tablet 1,500 mg  Status:  Discontinued     1,500 mg Oral Every 6 hours 08/18/18 1922 08/24/18 0930      Medications: Scheduled Meds: . bictegravir-emtricitabine-tenofovir AF  1 tablet Oral Daily  . clindamycin  600 mg Oral Q6H  . feeding supplement  1 Container Oral TID BM  . multivitamins with iron  1 tablet Oral Daily  . predniSONE  60 mg Oral Q breakfast  . Pyrimethamine 50mg / Leucovirin 20mg   1 capsule Oral Daily   And  . Pyrimethamine 25mg  / Leucovorin 5mg    1 capsule Oral Daily  . thiamine  100 mg Oral Daily   Continuous Infusions:  PRN Meds:.acetaminophen **OR** acetaminophen, camphor-menthol, diphenhydrAMINE, hydrocerin, naphazoline-glycerin, ondansetron **OR** ondansetron (ZOFRAN) IV, polyvinyl alcohol, sorbitol    Objective: Weight change:   Intake/Output Summary (Last 24 hours) at 08/25/2018 1105 Last data filed at 08/25/2018 0800 Gross per 24 hour  Intake 120 ml  Output 933 ml  Net -813 ml   Blood pressure 116/89, Park 97, temperature (!) 97.5 F (36.4 C), temperature source Axillary, resp. rate 18, height 5\' 8"  (1.727 m), weight 72.3 kg, SpO2 100 %. Temp:  [97.5 F (36.4 C)-98.2 F (36.8 C)] 97.5 F (36.4 C) (12/12 0533) Park Rate:  [97-103] 97 (12/12 0533) Resp:  [18-20] 18 (12/12 0533) BP: (116-159)/(81-126) 116/89 (12/12 0533) SpO2:  [100 %] 100 % (12/12 0533)  Physical Exam: General: Confused but able to answer some questions. HEENT: anicteric sclera, he is constantly gazing left occasionally able to move more towards midline but with  staccato-like movements CVS regular rate, normal  Chest: , no wheezing, no respiratory distress Abdomen: soft non-distended,  Extremities: no edema or deformity noted bilaterally Neuro no new focal deficits  Use rash see picture 08/24/2018:    08/25/2018:          CBC:    BMET No results for input(s): NA, K, CL, CO2, GLUCOSE, BUN, CREATININE, CALCIUM in the last 72 hours.   Liver Panel  No results for input(s): PROT, ALBUMIN, AST, ALT, ALKPHOS, BILITOT, BILIDIR, IBILI in the last 72 hours.     Sedimentation Rate No results for input(s): ESRSEDRATE in the last 72 hours. C-Reactive Protein No results for input(s): CRP in the last 72 hours.  Micro Results: Recent Results (from the past 720 hour(s))  Acid Fast Smear (AFB)     Status: None   Collection Time: 08/11/18 11:20 AM  Result Value Ref Range Status   AFB Specimen Processing Concentration  Final   Acid Fast Smear Negative  Final    Comment: (NOTE) Performed At: [facility] [place], [place] [id number] Rush Farmer MD WU:[id number]    Source (AFB) CSF  Final    Comment: Performed at [facility], [place] [address] [place], [place] [address]  Anaerobic culture     Status: None   Collection Time: 08/11/18 11:20 AM  Result Value Ref Range Status   Specimen Description CSF  Final   Special  Requests NONE  Final   Culture   Final    NO ANAEROBES ISOLATED Performed at [facility], [place] [address] [place], [place] [address]    Report Status 08/15/2018 FINAL  Final  CSF culture     Status: None   Collection Time: 08/11/18 11:20 AM  Result Value Ref Range Status   Specimen Description CSF  Final   Special Requests NONE  Final   Gram Stain NO WBC SEEN NO ORGANISMS SEEN   Final   Culture   Final    NO GROWTH 3 DAYS Performed at [facility], [place] [address] [place], [place] [address]    Report Status 08/14/2018 FINAL  Final  Fungus Culture With Stain      Status: None (Preliminary result)   Collection Time: 08/11/18 11:20 AM  Result Value Ref Range Status   Fungus Stain Final report  Final    Comment: (NOTE) Performed At: [facility] [place], [place] [id number] Rush Farmer MD RW:[id number]    Fungus (Mycology) Culture PENDING  Incomplete   Fungal Source CSF  Final    Comment: Performed at [facility], [place] [address], [place], [place] [address]  Andee Poles, PCR     Status: Abnormal   Collection Time: 08/11/18 11:20 AM  Result Value Ref Range Status   Toxoplasma Gondii, PCR Positive (A) Negative Final    Comment: (NOTE) Toxoplasma gondii DNA Detected. This test was developed and its performance characteristics determined by [employer]. It has not been cleared or approved by the U.S. Food and Drug Administration. The FDA has determined that such clearance or approval is not necessary. This test is used for clinical purposes. It should not be regarded as investigational or research. Performed At: [facility] [place], [place] [id number] Rush Farmer MD YK:[id number]   Fungus Culture Result     Status: None   Collection Time: 08/11/18 11:20 AM  Result Value Ref Range Status   Result 1 Comment  Final    Comment: (NOTE) KOH/Calcofluor preparation:  no fungus observed. Performed At: [facility] [address] [place], [place] [id number] Rush Farmer MD FX:[id number]   Culture, blood (Routine X 2) w Reflex to ID Panel     Status: None   Collection Time: 08/12/18 11:12 AM  Result Value Ref Range Status   Specimen Description BLOOD RIGHT ANTECUBITAL  Final   Special Requests   Final    BOTTLES DRAWN AEROBIC AND ANAEROBIC Blood Culture adequate volume   Culture   Final    NO GROWTH 5 DAYS Performed at [facility], [address] [place], [place] [address]    Report Status 08/17/2018 FINAL  Final  Culture, blood (Routine X 2) w Reflex to  ID Panel     Status: None   Collection Time: 08/12/18 11:19 AM  Result Value Ref Range Status   Specimen Description BLOOD LEFT ANTECUBITAL  Final   Special Requests   Final    BOTTLES DRAWN AEROBIC AND ANAEROBIC Blood Culture adequate volume   Culture   Final    NO GROWTH 5 DAYS Performed at [facility], [place] [address] [place],  [address]    Report Status 08/17/2018 FINAL  Final    Studies/Results: No results found.    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  He also has peripheral vision loss  #1 Toxoplasmosis of CNS with Encephalitis:  DC sulfadiazene -->clindamycin and continued pyrimethamine  Very apprehensive about starting antiretrovirals in this patient as I think he is at extremely high risk for immune reconstitution syndrome which could be catastrophic for him but we will make that next step today   2. HIV and AIDS: Go ahead and initiate antiretroviral therapy with Biktarvy.  That way we can observe for immune reconstitution that may happen in the next week or 2 at least 1 of those weeks will be in the observed inpatient setting.  She developed evidence of immune reconstitution syndrome we will escalate corticosteroid therapy  Thomas Park to come and attempt to fill out paperwork for   EMERGENCY ADAP so his meds will be covered BEFORE he leaves the hospital  NOTE HE IS NOT COMPETENT TO MAKE DECISIONS so a NEXT OF KIN HAVE to fill up paperwork  #3 drug rash: The most likely offending agent was the sulfadiazine.  We will initiate some corticosteroid therapy to try to attenuate his rash.  I agree with other maneuvers such as  getting hypoallergenic sheets etc.  #4 Competency: She is currently not competent to make medical decisions.  It might be prudent to clearly designate who is going to be making decisions for him until and unless he recovers his to do function which I certainly hope will be the case. Bedside Spanish interpreter was available through out and provided translation for my discussions with the patient and his sister.  We spent greater than 35  minutes with the patient including greater than 50% of time in face to face counsel of the patient and his sister regarding his care and in coordination of his care with our team at [facility].  Dr. Johnnye Sima will be here tomorrow, Dr Baxter Flattery available for questions over the weekend and I will be back on Monday.    LOS: 7 days   Alcide Evener 08/25/2018, 11:05 AM

## 2018-08-25 NOTE — Telephone Encounter (Signed)
Prescriptions for emergency adap application. Landis Gandy, RN

## 2018-08-25 NOTE — Plan of Care (Signed)
  Problem: Consults Goal: RH BRAIN INJURY PATIENT EDUCATION Description Description: See Patient Education module for eduction specifics Outcome: Progressing   Problem: RH BOWEL ELIMINATION Goal: RH STG MANAGE BOWEL WITH ASSISTANCE Description STG Manage Bowel with min Assistance.  Outcome: Progressing   Problem: RH BLADDER ELIMINATION Goal: RH STG MANAGE BLADDER WITH ASSISTANCE Description STG Manage Bladder With mod Assistance  Outcome: Progressing   Problem: RH SKIN INTEGRITY Goal: RH STG SKIN FREE OF INFECTION/BREAKDOWN Description Patients skin will remain free from further infection or breakdown with min assist.  Outcome: Progressing Goal: RH STG MAINTAIN SKIN INTEGRITY WITH ASSISTANCE Description STG Maintain Skin Integrity With min Assistance.  Outcome: Progressing   Problem: RH SAFETY Goal: RH STG ADHERE TO SAFETY PRECAUTIONS W/ASSISTANCE/DEVICE Description STG Adhere to Safety Precautions With Mod/max Assistance/Device.  Outcome: Progressing   Problem: RH Vision Goal: RH LTG Vision (Specify) Outcome: Progressing

## 2018-08-25 NOTE — Progress Notes (Signed)
Occupational Therapy Session Note  Patient Details  Name: Thomas Park MRN: [id number] Date of Birth: 02-Feb-1991  Today's Date: 08/25/2018  Session 1 OT Individual Time: 0830-0900 OT Individual Time Calculation (min): 30 min   Session 2 OT Individual Time: 1000-1030 OT Individual Time Calculation (min): 30 min    Short Term Goals: Week 1:  OT Short Term Goal 1 (Week 1): Pt will locate 1 grooming item at midline with max multimodal cues. OT Short Term Goal 2 (Week 1): Pt will complete toilet transfer with consistent min A OT Short Term Goal 3 (Week 1): Pt will complete bathing using L hand for 50% of task .  Skilled Therapeutic Interventions/Progress Updates:    Session 1 Pt greeted sitting in wc and agreeable to OT treatment session. Pt brought to therapy gym and worked on side stepping at rail in front Water engineer. Min A overall for side stepping with max verbal and tactile cues to keep body at midline. 10 x 2 sets of sit<>stands while keeping head at midline 30% of the time. Pt spontaneously looked to midline multiple times throughout session. Pt returned to room and left seated in wc with family present and needs met.  Session 2 Pt greeted seated in wc and agreeable to OT treatment session, interpreter and sister present. Pt brought to the sink and worked on completing grooming task at midline. Pt able to locate hot water with instructional cues and tactile cues for head turns. Set-up A to wash upper body with min A for thoroughness. Pt given shirt and was able to don shirt in correct sequence without cues. Pt brought to day room and worked on cup stacking activity standing at raised table. Mirror placed in front of pt as well for mirror feedback and visual scanning. Pt initiated head turns more frequently this session. Pt also able to remember OTs name after 1 minute. Pt returned to bed and left semi-reclined with sister present and bed alarm on.    Therapy  Documentation Precautions:  Precautions Precautions: Fall Restrictions Weight Bearing Restrictions: No Pain:   none denies pain  Therapy/Group: Individual Therapy  Valma Cava 08/25/2018, 2:41 PM

## 2018-08-25 NOTE — Progress Notes (Signed)
[place] PHYSICAL MEDICINE & REHABILITATION PROGRESS NOTE   Subjective/Complaints: Still itching. Perhaps a little better.   ROS: Limited due to cognitive/behavioral    Objective:   No results found. No results for input(s): WBC, HGB, HCT, PLT in the last 72 hours. No results for input(s): NA, K, CL, CO2, GLUCOSE, BUN, CREATININE, CALCIUM in the last 72 hours.  Intake/Output Summary (Last 24 hours) at 08/25/2018 1256 Last data filed at 08/25/2018 0800 Gross per 24 hour  Intake 120 ml  Output 900 ml  Net -780 ml     Physical Exam: Vital Signs Blood pressure 116/89, pulse 97, temperature (!) 97.5 F (36.4 C), temperature source Axillary, resp. rate 18, height 5\' 8"  (1.727 m), weight 72.3 kg, SpO2 100 %.  Constitutional: No distress . Vital signs reviewed. HEENT: EOMI, oral membranes moist, injected sclerae Neck: supple Cardiovascular: RRR without murmur. No JVD    Respiratory: CTA Bilaterally without wheezes or rales. Normal effort    GI: BS +, non-tender, non-distended  Neurological:eyes still deviated to left. Follows all commands when communicated in [place].  Manual muscle testing at least 4/5 bilateral deltoid bicep tricep hip flexor knee extensor ankle dorsiflexor--no changes Skin: diffuse maculopapular rash still present    Psych: flat    Assessment/Plan: 1. 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  Physiatrist is providing close team supervision and 24 hour management of active medical problems listed below.  Physiatrist and rehab team continue to assess barriers to discharge/monitor patient progress toward functional and medical goals  Care Tool:  Bathing    Body parts bathed by patient: Right arm, Left arm, Chest, Abdomen, Front perineal area, Left upper leg, Right upper leg, Right lower leg, Left lower leg, Face, Buttocks   Body parts  bathed by helper: Buttocks     Bathing assist Assist Level: Moderate Assistance - Patient 50 - 74%     Upper Body Dressing/Undressing Upper body dressing   What is the patient wearing?: Pull over shirt    Upper body assist Assist Level: Minimal Assistance - Patient > 75%    Lower Body Dressing/Undressing Lower body dressing      What is the patient wearing?: Pants     Lower body assist Assist for lower body dressing: Supervision/Verbal cueing     Toileting Toileting Toileting Activity did not occur (Clothing management and hygiene only): N/A (no void or bm)(Admitted late and did not occur on shift)  Toileting assist Assist for toileting: Moderate Assistance - Patient 50 - 74%     Transfers Chair/bed transfer  Transfers assist     Chair/bed transfer assist level: Minimal Assistance - Patient > 75%     Locomotion Ambulation   Ambulation assist      Assist level: Moderate Assistance - Patient 50 - 74% Assistive device: Hand held assist Max distance: 226ft   Walk 10 feet activity   Assist     Assist level: Moderate Assistance - Patient - 50 - 74% Assistive device: Hand held assist   Walk 50 feet activity   Assist Walk 50 feet with 2 turns activity did not occur: Safety/medical concerns  Assist level: Moderate Assistance - Patient - 50 - 74% Assistive device: Hand held assist    Walk 150 feet activity   Assist Walk 150 feet activity did not occur: Safety/medical concerns  Assist level: Moderate Assistance - Patient - 50 - 74%  Walk 10 feet on uneven surface  activity   Assist Walk 10 feet on uneven surfaces activity did not occur: Safety/medical concerns         Wheelchair     Assist Will patient use wheelchair at discharge?: No             Wheelchair 50 feet with 2 turns activity    Assist            Wheelchair 150 feet activity     Assist          Medical Problem List and Plan: 1.Decreased  functional mobility with cognitive deficitssecondary to acute encephalopathy related toxoplasmosis in the setting of immunosuppression due to recently diagnosed HIV.   -Continue CIR therapies including PT, OT    -Follow-up per infectious disease.   -Currently completing a course of clindamycin and Mepron 2. DVT Prophylaxis/Anticoagulation: SCDs 3. Pain Management:Tylenol as needed 4. Mood:Provide emotional support 5. Neuropsych: This patientis notcapable of making decisions on hisown behalf. 6. Skin/Wound Care:likely drug related  -urecholine was started yesterday---I stopped this morning  -benadryl 25mg  q6 prn  -sulfa held   -added sarna for topical relief 7. Fluids/Electrolytes/Nutrition:  Encourage PO  - supp  For hypalbuminemia 8.HIV. Follow-up per infectious disease and await plan to begin anti-retroviral medications 9. Urinary retention. Voiding trial  -voiding some on his own  -I/O caths prn  -held urecholine as above 10. History of polysubstance abuse. Urine drug screen negative 12.  Injected sclera  Naphcon helping  LOS: 7 days A FACE TO FACE EVALUATION WAS PERFORMED  Meredith Staggers 08/25/2018, 12:56 PM

## 2018-08-25 NOTE — Progress Notes (Signed)
Physical Therapy Session Note  Patient Details  Name: Thomas Park MRN: [id number] Date of Birth: 07-Dec-1990  Today's Date: 08/25/2018 PT Individual Time: 1300-1400 PT Individual Time Calculation (min): 60 min   Short Term Goals: Week 1:  PT Short Term Goal 1 (Week 1): pt will perform functional transfers with min A PT Short Term Goal 2 (Week 1): pt will perform gait with min A x 50' in controlled environment  Skilled Therapeutic Interventions/Progress Updates:    pt performs gait throughout unit with min A initially, mod A when fatigued due to Lt lean and impaired attention.  Stair negotiation 4 steps x 3 with bilat handrails and focus on midline posture during stair training with min A.  Seated reaching task with focus on head and eyes crossing midline with max cuing, much improved head turn past midline, eyes continue with Lt deviation.  Standing balance with ball kick with pt stating he enjoys this activity, min/mod A for balance, pt able to find ball to right and in midline during task.  Standing folding towels with pt able to find towels with mod A and fold appropriately with mod cuing.  Standing kinetron for balance and strengthening with mod A for balance, pt fatigues easily at end of session.  Pt missed final 15 minutes of session due to financial counselor being present and unable to come back another time.  Therapy Documentation Precautions:  Precautions Precautions: Fall Restrictions Weight Bearing Restrictions: No General: PT Amount of Missed Time (min): 15 Minutes PT Missed Treatment Reason: Unavailable (Comment)(financial counseling) Pain:  no c/o pain   Therapy/Group: Individual Therapy  Romeka Scifres 08/25/2018, 2:02 PM

## 2018-08-25 NOTE — Progress Notes (Signed)
Speech Language Pathology Daily Session Note  Patient Details  Name: Thomas Park MRN: [id number] Date of Birth: [birth year]  Today's Date: 08/25/2018 SLP Individual Time: 5170-0174 SLP Individual Time Calculation (min): 55 min  Short Term Goals: Week 1: SLP Short Term Goal 1 (Week 1): Pt will orient to place and date with max verbal cues.   SLP Short Term Goal 2 (Week 1): Pt will sustain his attention to basic, functional tasks for 5 minute intervals with mod assist cues for redirection.   SLP Short Term Goal 3 (Week 1): Pt will complete basic, familiar tasks with max assist for functional problem solving.   SLP Short Term Goal 4 (Week 1): Pt will recall basic, daily information with max assist verbal cues.    Skilled Therapeutic Interventions: Skilled treatment session focused on cognitive goals. SLP facilitated session by providing Max A multimodal cues to scan to midline to identify pictures of familiar and popular soccer players, large numbers and drinking cup. Patient's function with task impacted by decreased attention due to constant itching. RN aware and administered medications. Patient left upright in chair with alarm on and family present. Continue with current plan of care.      Pain No/Denies Pain   Therapy/Group: Individual Therapy  Gerilyn Stargell 08/25/2018, 4:16 PM

## 2018-08-26 ENCOUNTER — Inpatient Hospital Stay (HOSPITAL_COMMUNITY): Payer: Self-pay | Admitting: Physical Therapy

## 2018-08-26 ENCOUNTER — Inpatient Hospital Stay (HOSPITAL_COMMUNITY): Payer: Self-pay | Admitting: Occupational Therapy

## 2018-08-26 ENCOUNTER — Ambulatory Visit (HOSPITAL_COMMUNITY): Payer: Self-pay | Admitting: Speech Pathology

## 2018-08-26 NOTE — Progress Notes (Signed)
   INFECTIOUS DISEASE PROGRESS NOTE  ID: Thomas Park [name] is a 27 y.o. 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 bictegravir-emtricitabine-tenofovir AF  1 tablet Oral Daily  . clindamycin  600 mg Oral Q6H  . feeding supplement  1 Container Oral TID BM  . multivitamins with iron  1 tablet Oral Daily  . predniSONE  60 mg Oral Q breakfast  . Pyrimethamine 50mg / Leucovirin 20mg   1 capsule Oral Daily   And  . Pyrimethamine 25mg  / Leucovorin 5mg    1 capsule Oral Daily  . thiamine  100 mg Oral Daily    Objective: Vital signs in last 24 hours: Temp:  [98.1 F (36.7 C)-98.6 F (37 C)] 98.6 F (37 C) (12/12 2042) Pulse Rate:  [98-110] 110 (12/12 2042) Resp:  [18] 18 (12/12 2042) BP: (129-131)/(78-86) 129/78 (12/12 2042) SpO2:  [99 %-100 %] 99 % (12/12 2042)   General appearance: alert, cooperative and no distress Skin: maculo-papular rash. mostly on back.   Lab Results No results for input(s): WBC, HGB, HCT, NA, K, CL, CO2, BUN, CREATININE, GLU in the last 72 hours.  Invalid input(s): PLATELETS Liver Panel No results for input(s): PROT, ALBUMIN, AST, ALT, ALKPHOS, BILITOT, BILIDIR, IBILI in the last 72 hours. Sedimentation Rate No results for input(s): ESRSEDRATE in the last 72 hours. C-Reactive Protein No results for input(s): CRP in the last 72  hours.  Microbiology: No results found for this or any previous visit (from the past 240 hour(s)).  Studies/Results: No results found.   Assessment/Plan: Toxoplasmic encephalitis AIDS ADR  Total days of antibiotics: 1 clinda/primaquine   1 biktarvy  So far no reconstitution syndrome from starting ART.  His rash is slightly better. Taper steroids as able.  Topicals for pruritis.  He is up doing PT. Better than I have seen him yet.          Bobby Rumpf MD, FACP Infectious Diseases (pager) [phone] [email or web address] 08/26/2018, 10:00 AM  LOS: 8 days

## 2018-08-26 NOTE — Progress Notes (Signed)
Speech Language Pathology Weekly Progress and Session Note  Patient Details  Name: Thomas Park MRN: [id number] Date of Birth: 05/11/1991  Beginning of progress report period: August 19, 2018 End of progress report period: August 26, 2018  Today's Date: 08/26/2018 SLP Individual Time: 1100-1200 SLP Individual Time Calculation (min): 60 min  Short Term Goals: Week 1: SLP Short Term Goal 1 (Week 1): Pt will orient to place and date with max verbal cues.   SLP Short Term Goal 1 - Progress (Week 1): Not met SLP Short Term Goal 2 (Week 1): Pt will sustain his attention to basic, functional tasks for 5 minute intervals with mod assist cues for redirection.   SLP Short Term Goal 2 - Progress (Week 1): Not met SLP Short Term Goal 3 (Week 1): Pt will complete basic, familiar tasks with max assist for functional problem solving.   SLP Short Term Goal 3 - Progress (Week 1): Not met SLP Short Term Goal 4 (Week 1): Pt will recall basic, daily information with max assist verbal cues.   SLP Short Term Goal 4 - Progress (Week 1): Not met    New Short Term Goals: Week 2: SLP Short Term Goal 1 (Week 2): Pt will sustain his attention to basic, functional tasks for 5 minute intervals with mod assist cues for redirection.   SLP Short Term Goal 2 (Week 2): Pt will orient to place and date with max verbal cues.   SLP Short Term Goal 3 (Week 2): Pt will complete basic, familiar tasks with max assist for functional problem solving.   SLP Short Term Goal 4 (Week 2): Pt will recall basic, daily information with max assist verbal cues.   SLP Short Term Goal 5 (Week 2): Patient will scan to midline to locate items during functional tasks with Max A multimodal cues.   Weekly Progress Updates: Patient has made minimal progress and has not met any STGs this reporting period. Patient continues to require overall Total A for functional problem solving, recall and orientation and Max A multimodal cues  for visual scanning to and past midline to locate items during functional tasks. Patient and family education ongoing and goals downgraded due to lack of cognitive gains. Patient would benefit from continued skilled SLP intervention to maximize his cognitive functioning prior to discharge.       Intensity: Minumum of 1-2 x/day, 30 to 90 minutes Frequency: 3 to 5 out of 7 days Duration/Length of Stay: 12/20  Treatment/Interventions: Cognitive remediation/compensation;Cueing hierarchy;Functional tasks;Patient/family education;Internal/external aids;Environmental controls;Therapeutic Activities   Daily Session  Skilled Therapeutic Interventions: Skilled treatment session focused on cognitive goals. SLP facilitated session by providing Max A multimodal cues for patient to scan to midline to locate playing cards and to not just utilize tactile cues to locate.  Max A verbal cues needed for sustained attention to tasks for ~2 minute intervals. Patient required Max-Total A to identify the number and color of the cards. Suspect function impacted by attention. Patient self-fed his lunch meal of regular textures with thin liquids with Max A verbal cues to locate items on his tray. However, Max-Total A verbal cues needed to decrease impulsivity with task and for attention (patient attempted to eat rolled up napkin thinking it was a tortilla). Patient left upright in wheelchair with father present. Continue with current plan of care.      Pain No/Denies Pain   Therapy/Group: Individual Therapy  PAYNE, COURTNEY 08/26/2018, 3:28 PM         

## 2018-08-26 NOTE — Progress Notes (Signed)
Occupational Therapy Weekly Progress Note  Patient Details  Name: Thomas Park MRN: [id number] Date of Birth: October 29, 1990  Beginning of progress report period: August 19, 2018 End of progress report period: August 26, 2018  Today's Date: 08/26/2018 OT Individual Time: 7703-4035 OT Individual Time Calculation (min): 57 min   Patient has met 3 of 3 short term goals.  Pt is making progress towards OT goals. He is able to complete functional tasks and transfers with more consistent min A, but continues to need multimodal cues to locate objects at midline and for visual scanning. Pt has demonstrated improved awareness with head turns to scan for objects but continues to have severe L gaze preference. Continue educating family and working towards functional goals.  Patient continues to demonstrate the following deficits: muscle weakness, motor apraxia, ataxia, decreased coordination and decreased motor planning, decreased visual perceptual skills and decreased visual motor skills, decreased midline orientation, decreased attention to right and right side neglect, decreased initiation, decreased attention, decreased awareness, decreased problem solving, decreased safety awareness, decreased memory and delayed processing and decreased sitting balance, decreased standing balance, decreased postural control and decreased balance strategies and therefore will continue to benefit from skilled OT intervention to enhance overall performance with BADL and Reduce care partner burden.  Patient progressing toward long term goals..  Continue plan of care.  OT Short Term Goals Week 1:  OT Short Term Goal 1 (Week 1): Pt will locate 1 grooming item at midline with max multimodal cues. OT Short Term Goal 1 - Progress (Week 1): Met OT Short Term Goal 2 (Week 1): Pt will complete toilet transfer with consistent min A OT Short Term Goal 2 - Progress (Week 1): Met OT Short Term Goal 3 (Week 1): Pt will  complete bathing using L hand for 50% of task . OT Short Term Goal 3 - Progress (Week 1): Met Week 2:  OT Short Term Goal 1 (Week 2): STG=LTG 2/2 ELOS  Skilled Therapeutic Interventions/Progress Updates:    Pt asleep upon OT arrival, easy to wake up but confused this morning trying to reach out of the bed and would not let go of pillow. Eventually able to redirect pt and he transferred to the wc with min A and max multimodal cues. Stand-pivot into shower with min A and continued multimodal cues. Pt able to initiate bathing tasks of UE and LEs but needed verbal cues to wash other body parts. Dressing completed with focus on trying to bring gaze to midline. Pt was able to reach to midline to collect clothing, but did not turn head to scan. Pt needed verbal cues for orientation of clothing, then was able to don shirt and pants. Toothbrushing task at the sink in midline with OT utilizing NDT techniques to bring body to midline. Set-up A and verbal cues for sequencing of applying toothpaste prior to brushing. Pt brought to dayroom and continued to work on visual scanning and color identification. Pt scanned eyes 60 degrees towards midline upon 2 instances today when looking for red, blue, or white tokens. Pt returned to room and end of session and left seated in wc with safety alarm belt on and needs met.   Therapy Documentation Precautions:  Precautions Precautions: Fall Restrictions Weight Bearing Restrictions: No Pain:   none/denies pain  Therapy/Group: Individual Therapy  Valma Cava 08/26/2018, 8:08 AM

## 2018-08-26 NOTE — Progress Notes (Signed)
[place] [facility] PHYSICAL MEDICINE & REHABILITATION PROGRESS NOTE   Subjective/Complaints: Rash better. No new complaints. Family at bedside  ROS: Patient denies fever, rash, sore throat, blurred vision, nausea, vomiting, diarrhea, cough, shortness of breath or chest pain, joint or back pain, headache, or mood change.   Objective:   No results found. No results for input(s): WBC, HGB, HCT, PLT in the last 72 hours. No results for input(s): NA, K, CL, CO2, GLUCOSE, BUN, CREATININE, CALCIUM in the last 72 hours.  Intake/Output Summary (Last 24 hours) at 08/26/2018 1310 Last data filed at 08/26/2018 1230 Gross per 24 hour  Intake 880 ml  Output 353 ml  Net 527 ml     Physical Exam: Vital Signs Blood pressure 129/78, pulse (!) 110, temperature 98.6 F (37 C), resp. rate 18, height 5\' 8"  (1.727 m), weight 72.3 kg, SpO2 99 %.  Constitutional: No distress . Vital signs reviewed. HEENT: EOMI, oral membranes moist Neck: supple Cardiovascular: RRR without murmur. No JVD    Respiratory: CTA Bilaterally without wheezes or rales. Normal effort    GI: BS +, non-tender, non-distended  Neurological:eyes still deviated to left. Follows all commands when communicated in [place].  Manual muscle testing at least 4/5 bilateral deltoid bicep tricep hip flexor knee extensor ankle dorsiflexor--no changes Skin: diffuse maculopapular rash still present    Psych: flat    Assessment/Plan: 1. 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  Physiatrist is providing close team supervision and 24 hour management of active medical problems listed below.  Physiatrist and rehab team continue to assess barriers to discharge/monitor patient progress toward functional and medical goals  Care Tool:  Bathing    Body parts bathed by patient: Right arm, Left arm, Chest, Abdomen, Front perineal area,  Left upper leg, Right upper leg, Right lower leg, Left lower leg, Face, Buttocks   Body parts bathed by helper: Buttocks     Bathing assist Assist Level: Moderate Assistance - Patient 50 - 74%     Upper Body Dressing/Undressing Upper body dressing   What is the patient wearing?: Pull over shirt    Upper body assist Assist Level: Minimal Assistance - Patient > 75%    Lower Body Dressing/Undressing Lower body dressing      What is the patient wearing?: Pants     Lower body assist Assist for lower body dressing: Supervision/Verbal cueing     Toileting Toileting Toileting Activity did not occur (Clothing management and hygiene only): N/A (no void or bm)(Admitted late and did not occur on shift)  Toileting assist Assist for toileting: Moderate Assistance - Patient 50 - 74%     Transfers Chair/bed transfer  Transfers assist     Chair/bed transfer assist level: Minimal Assistance - Patient > 75%     Locomotion Ambulation   Ambulation assist      Assist level: Minimal Assistance - Patient > 75% Assistive device: Hand held assist Max distance: 236ft   Walk 10 feet activity   Assist     Assist level: Minimal Assistance - Patient > 75% Assistive device: Hand held assist   Walk 50 feet activity   Assist Walk 50 feet with 2 turns activity did not occur: Safety/medical concerns  Assist level: Minimal Assistance - Patient > 75% Assistive device: Hand held assist    Walk 150 feet activity   Assist Walk 150 feet activity did not occur: Safety/medical concerns  Assist level:  Minimal Assistance - Patient > 75%      Walk 10 feet on uneven surface  activity   Assist Walk 10 feet on uneven surfaces activity did not occur: Safety/medical concerns         Wheelchair     Assist Will patient use wheelchair at discharge?: No             Wheelchair 50 feet with 2 turns activity    Assist            Wheelchair 150 feet activity      Assist          Medical Problem List and Plan: 1.Decreased functional mobility with cognitive deficitssecondary to acute encephalopathy related toxoplasmosis in the setting of immunosuppression due to recently diagnosed HIV.   -Continue CIR therapies including PT, OT    -Follow-up per infectious disease.   -Currently completing a course of clindamycin and Mepron 2. DVT Prophylaxis/Anticoagulation: SCDs 3. Pain Management:Tylenol as needed 4. Mood:Provide emotional support 5. Neuropsych: This patientis notcapable of making decisions on hisown behalf. 6. Skin/Wound Care:likely drug related---improving  -urecholine stopped  -benadryl 25mg  q6 prn  -sulfa held   -added sarna for topical relief 7. Fluids/Electrolytes/Nutrition:  Encourage PO  - supp  For hypalbuminemia 8.HIV. plan per ID 9. Urinary retention. Voiding trial  -voiding some on his own  -I/O caths prn  -held urecholine as above 10. History of polysubstance abuse. Urine drug screen negative 12.  Injected sclera  Naphcon helping  LOS: 8 days A FACE TO FACE EVALUATION WAS PERFORMED  Meredith Staggers 08/26/2018, 1:10 PM

## 2018-08-26 NOTE — Progress Notes (Signed)
Physical Therapy Weekly Progress Note  Patient Details  Name: Thomas Park MRN: [id number] Date of Birth: 1990/11/08  Beginning of progress report period: August 19, 2018 End of progress report period: August 26, 2018  Today's Date: 08/26/2018 PT Individual Time: 1594-5859 PT Individual Time Calculation (min): 68 min   Pt performs standing balance for grooming tasks with min A.  Gait throughout unit with min A, improving balance but continues with Lt lean when fatigued.  Soccer ball kick for scanning, posture and balance with pt improving balance reactions, continues to require min/mod A for dynamic standing balance.  Zoom ball with focus on pt watching ball and keeping head turned to midline with max multimodal cues to perform.  Seated scanning activity with cup stacking task with pt using compensatory methods with min A, max cuing for bringing head and eyes to midline.  nustep x 6 minutes while performing orientation questions with pt able to correctly state his name and birthday as well as his father and sisters names, much improved.  Pt left in w/c with alarm set, family present, needs at hand.   Patient has met 2 of 2 short term goals.  Pt continues with cognitive and visual deficits limiting functional progress. Pt is improving activity tolerance and balance and is improving midline posture.  Patient continues to demonstrate the following deficits muscle weakness, impaired timing and sequencing, motor apraxia, decreased coordination and decreased motor planning, decreased attention, decreased awareness, decreased problem solving, decreased safety awareness, decreased memory and delayed processing and decreased sitting balance, decreased standing balance, decreased postural control, hemiplegia and decreased balance strategies and therefore will continue to benefit from skilled PT intervention to increase functional independence with mobility.  Patient not progressing toward  long term goals.  See goal revision..  Continue plan of care.  PT Short Term Goals Week 1:  PT Short Term Goal 1 (Week 1): pt will perform functional transfers with min A PT Short Term Goal 2 (Week 1): pt will perform gait with min A x 50' in controlled environment Week 2:  PT Short Term Goal 1 (Week 2): = LTG  Skilled Therapeutic Interventions/Progress Updates:  Ambulation/gait training;Pain management;Stair training;Balance/vestibular training;DME/adaptive equipment instruction;Patient/family education;Therapeutic Activities;Wheelchair propulsion/positioning;Cognitive remediation/compensation;Therapeutic Exercise;Community reintegration;Functional mobility training;UE/LE Strength taining/ROM;Discharge planning;Neuromuscular re-education;Splinting/orthotics;UE/LE Coordination activities;Visual/perceptual remediation/compensation   Therapy Documentation Precautions:  Precautions Precautions: Fall Restrictions Weight Bearing Restrictions: No Pain:  no c/o pain  Therapy/Group: Individual Therapy  Correll Denbow 08/26/2018, 9:08 AM

## 2018-08-27 ENCOUNTER — Inpatient Hospital Stay (HOSPITAL_COMMUNITY): Payer: Self-pay | Admitting: Physical Therapy

## 2018-08-27 LAB — HIV-1 RNA QUANT-NO REFLEX-BLD
HIV 1 RNA Quant: 669000 copies/mL
LOG10 HIV-1 RNA: 5.825 log10copy/mL

## 2018-08-27 NOTE — Progress Notes (Signed)
Physical Therapy Session Note  Patient Details  Name: Thomas Park MRN: [id number] Date of Birth: [birth year]  Today's Date: 08/27/2018 PT Individual Time: 1003-1100 PT Individual Time Calculation (min): 57 min   Short Term Goals: Week 1:  PT Short Term Goal 1 (Week 1): pt will perform functional transfers with min A PT Short Term Goal 2 (Week 1): pt will perform gait with min A x 50' in controlled environment  Skilled Therapeutic Interventions/Progress Updates:  Pt was seen bedside in the am with father and interpretor at bedside. Pt transferred supine to edge of bed with S and verbal cues. Pt transferred edge of bed to w/c with min A and verbal cues. Pt performed multiple sit to stand transfers with min A and verbal cues. While standing worked on [employer] taps both and without ankle weights for [employer]. Pt ambulated 85 feet x 8 with min A and increasing assistance noted as pt fatigued. While ambulating worked on visual scanning with assistance of interpretor. Pt returned to room following treatment and left sitting up in w/c with chair alarm on and dad at bedside.   Therapy Documentation Precautions:  Precautions Precautions: Fall Restrictions Weight Bearing Restrictions: No General:   Pain: No c/o pain.    Therapy/Group: Individual Therapy  Dub Amis 08/27/2018, 12:26 PM

## 2018-08-28 NOTE — Progress Notes (Signed)
[place] PHYSICAL MEDICINE & REHABILITATION PROGRESS NOTE   Subjective/Complaints: Pt up and waliking to bathroom. No new issues  ROS: limited due to language/communication   Objective:   No results found. No results for input(s): WBC, HGB, HCT, PLT in the last 72 hours. No results for input(s): NA, K, CL, CO2, GLUCOSE, BUN, CREATININE, CALCIUM in the last 72 hours.  Intake/Output Summary (Last 24 hours) at 08/28/2018 1339 Last data filed at 08/27/2018 1748 Gross per 24 hour  Intake 480 ml  Output 23 ml  Net 457 ml     Physical Exam: Vital Signs Blood pressure 125/81, pulse 91, temperature 98.4 F (36.9 C), temperature source Oral, resp. rate 19, height 5\' 8"  (1.727 m), weight 72.3 kg, SpO2 100 %.  Constitutional: No distress . Vital signs reviewed. HEENT: EOMI, oral membranes moist Neck: supple Cardiovascular: RRR without murmur. No JVD    Respiratory: CTA Bilaterally without wheezes or rales. Normal effort    GI: BS +, non-tender, non-distended  Neurological:eyes still deviated to left., pt lists to left during stance Follows all commands when communicated in [place].  Manual muscle testing at least 4/5 bilateral deltoid bicep tricep hip flexor knee extensor ankle dorsiflexor--no changes Skin: diffuse maculopapular rash almost completely resolved    Psych: flat    Assessment/Plan: 1. 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  Physiatrist is providing close team supervision and 24 hour management of active medical problems listed below.  Physiatrist and rehab team continue to assess barriers to discharge/monitor patient progress toward functional and medical goals  Care Tool:  Bathing    Body parts bathed by patient: Right arm, Left arm, Chest, Abdomen, Front perineal area, Left upper leg, Right upper leg, Right lower leg, Left lower leg, Face,  Buttocks   Body parts bathed by helper: Buttocks     Bathing assist Assist Level: Moderate Assistance - Patient 50 - 74%     Upper Body Dressing/Undressing Upper body dressing   What is the patient wearing?: Pull over shirt    Upper body assist Assist Level: Minimal Assistance - Patient > 75%    Lower Body Dressing/Undressing Lower body dressing      What is the patient wearing?: Pants     Lower body assist Assist for lower body dressing: Supervision/Verbal cueing     Toileting Toileting Toileting Activity did not occur (Clothing management and hygiene only): N/A (no void or bm)(Admitted late and did not occur on shift)  Toileting assist Assist for toileting: Moderate Assistance - Patient 50 - 74%     Transfers Chair/bed transfer  Transfers assist     Chair/bed transfer assist level: Minimal Assistance - Patient > 75%     Locomotion Ambulation   Ambulation assist      Assist level: Moderate Assistance - Patient 50 - 74% Assistive device: Hand held assist Max distance: 85   Walk 10 feet activity   Assist     Assist level: Moderate Assistance - Patient - 50 - 74% Assistive device: Hand held assist   Walk 50 feet activity   Assist Walk 50 feet with 2 turns activity did not occur: Safety/medical concerns  Assist level: Moderate Assistance - Patient - 50 - 74% Assistive device: Hand held assist    Walk 150 feet activity   Assist Walk 150 feet activity did not occur: Safety/medical concerns  Assist level: Minimal Assistance - Patient > 75%  Walk 10 feet on uneven surface  activity   Assist Walk 10 feet on uneven surfaces activity did not occur: Safety/medical concerns         Wheelchair     Assist Will patient use wheelchair at discharge?: No             Wheelchair 50 feet with 2 turns activity    Assist            Wheelchair 150 feet activity     Assist          Medical Problem List and  Plan: 1.Decreased functional mobility with cognitive deficitssecondary to acute encephalopathy related toxoplasmosis in the setting of immunosuppression due to recently diagnosed HIV.   -Continue CIR therapies including PT, OT    -Follow-up per infectious disease.   -Currently completing a course of clindamycin and Mepron 2. DVT Prophylaxis/Anticoagulation: SCDs 3. Pain Management:Tylenol as needed 4. Mood:Provide emotional support 5. Neuropsych: This patientis notcapable of making decisions on hisown behalf. 6. Skin/Wound Care:likely drug related---improved  -urecholine stopped  -benadryl 25mg  q6 prn  -sulfa held   -added sarna for topical relief 7. Fluids/Electrolytes/Nutrition:  Encourage PO  - supp  For hypalbuminemia 8.HIV. plan per ID 9. Urinary retention. Voiding trial  -voiding some on his own, up to bathroom when possibl  -I/O caths prn    10. History of polysubstance abuse. Urine drug screen negative 12.  Injected sclera  Naphcon helping  LOS: 10 days A FACE TO FACE EVALUATION WAS PERFORMED  Meredith Staggers 08/28/2018, 1:39 PM

## 2018-08-28 NOTE — Plan of Care (Signed)
  Problem: Consults Goal: RH BRAIN INJURY PATIENT EDUCATION Description Description: See Patient Education module for eduction specifics Outcome: Progressing Goal: Skin Care Protocol Initiated - if Braden Score 18 or less Description If consults are not indicated, leave blank or document N/A Outcome: Progressing Goal: Nutrition Consult-if indicated Outcome: Progressing   Problem: RH BOWEL ELIMINATION Goal: RH STG MANAGE BOWEL WITH ASSISTANCE Description STG Manage Bowel with min Assistance.  Outcome: Progressing Goal: RH STG MANAGE BOWEL W/MEDICATION W/ASSISTANCE Description STG Manage Bowel with Medication with min Assistance.  Outcome: Progressing   Problem: RH BLADDER ELIMINATION Goal: RH STG MANAGE BLADDER WITH ASSISTANCE Description STG Manage Bladder With mod Assistance  Outcome: Progressing Goal: RH STG MANAGE BLADDER WITH MEDICATION WITH ASSISTANCE Description STG Manage Bladder With Medication With mod Assistance.  Outcome: Progressing   Problem: RH SKIN INTEGRITY Goal: RH STG SKIN FREE OF INFECTION/BREAKDOWN Description Patients skin will remain free from further infection or breakdown with min assist.  Outcome: Progressing Goal: RH STG MAINTAIN SKIN INTEGRITY WITH ASSISTANCE Description STG Maintain Skin Integrity With min Assistance.  Outcome: Progressing   Problem: RH Vision Goal: RH LTG Vision (Specify) Outcome: Progressing   Problem: RH SAFETY Goal: RH STG ADHERE TO SAFETY PRECAUTIONS W/ASSISTANCE/DEVICE Description STG Adhere to Safety Precautions With Mod/max Assistance/Device.  Outcome: Not Progressing   Problem: RH COGNITION-NURSING Goal: RH STG USES MEMORY AIDS/STRATEGIES W/ASSIST TO PROBLEM SOLVE Description STG Uses Memory Aids/Strategies With mod Assistance to Problem Solve.  Outcome: Not Progressing Goal: RH STG ANTICIPATES NEEDS/CALLS FOR ASSIST W/ASSIST/CUES Description STG Anticipates Needs/Calls for Assist With mod  Assistance/Cues.  Outcome: Not Progressing   Problem: RH KNOWLEDGE DEFICIT BRAIN INJURY Goal: RH STG INCREASE KNOWLEDGE OF SELF CARE AFTER BRAIN INJURY Description Mod assist  Outcome: Not Progressing

## 2018-08-29 ENCOUNTER — Inpatient Hospital Stay (HOSPITAL_COMMUNITY): Payer: Self-pay | Admitting: Speech Pathology

## 2018-08-29 ENCOUNTER — Inpatient Hospital Stay (HOSPITAL_COMMUNITY): Payer: Self-pay | Admitting: Physical Therapy

## 2018-08-29 ENCOUNTER — Inpatient Hospital Stay (HOSPITAL_COMMUNITY): Payer: Self-pay | Admitting: Occupational Therapy

## 2018-08-29 MED ORDER — BICTEGRAVIR-EMTRICITAB-TENOFOV 50-200-25 MG PO TABS: 1.0000 | ORAL_TABLET | Freq: Every day | ORAL | 0 refills | Status: DC

## 2018-08-29 MED ORDER — CLINDAMYCIN HCL 300 MG PO CAPS: 600.0000 mg | ORAL_CAPSULE | Freq: Four times a day (QID) | ORAL | 0 refills | Status: DC

## 2018-08-29 NOTE — Progress Notes (Signed)
Subjective: Rash is better  Antibiotics:  Anti-infectives (From admission, onward)   Start     Dose/Rate Route Frequency Ordered Stop   08/25/18 1300  bictegravir-emtricitabine-tenofovir AF (BIKTARVY) 50-200-25 MG per tablet 1 tablet     1 tablet Oral Daily 08/25/18 1050     08/24/18 1200  clindamycin (CLEOCIN) capsule 600 mg     600 mg Oral Every 6 hours 08/24/18 0930     08/18/18 1930  sulfaDIAZINE tablet 1,500 mg  Status:  Discontinued     1,500 mg Oral Every 6 hours 08/18/18 1922 08/24/18 0930      Medications: Scheduled Meds: . bictegravir-emtricitabine-tenofovir AF  1 tablet Oral Daily  . clindamycin  600 mg Oral Q6H  . feeding supplement  1 Container Oral TID BM  . multivitamins with iron  1 tablet Oral Daily  . Pyrimethamine 50mg / Leucovirin 20mg   1 capsule Oral Daily   And  . Pyrimethamine 25mg  / Leucovorin 5mg    1 capsule Oral Daily  . thiamine  100 mg Oral Daily   Continuous Infusions:  PRN Meds:.acetaminophen **OR** acetaminophen, camphor-menthol, diphenhydrAMINE, hydrocerin, naphazoline-glycerin, ondansetron **OR** ondansetron (ZOFRAN) IV, polyvinyl alcohol, sorbitol    Objective: Weight change:  No intake or output data in the 24 hours ending 08/29/18 1150 Blood pressure (!) 103/54, pulse 88, temperature (!) 95.9 F (35.5 C), temperature source Axillary, resp. rate 19, height 5\' 8"  (1.727 m), weight 72.3 kg, SpO2 100 %. Temp:  [95.9 F (35.5 C)-97.1 F (36.2 C)] 95.9 F (35.5 C) (12/15 2011) Pulse Rate:  [86-88] 88 (12/15 2011) Resp:  [19] 19 (12/15 2011) BP: (103-123)/(54-79) 103/54 (12/15 2011) SpO2:  [100 %] 100 % (12/15 2011)  Physical Exam: General: Confused but able to answer some questions. He thinks he is in the city in [place] in which he was born [birth year]: anicteric sclera, he is constantly gazing left  towards midline but with staccato-like movements CVS regular rate, normal  Chest: , no wheezing, no respiratory  distress Abdomen: soft non-distended,  Extremities: no edema or deformity noted bilaterally Neuro no new focal deficits  Use rash see picture 08/24/2018:    08/25/2018:          CBC:    BMET No results for input(s): NA, K, CL, CO2, GLUCOSE, BUN, CREATININE, CALCIUM in the last 72 hours.   Liver Panel  No results for input(s): PROT, ALBUMIN, AST, ALT, ALKPHOS, BILITOT, BILIDIR, IBILI in the last 72 hours.     Sedimentation Rate No results for input(s): ESRSEDRATE in the last 72 hours. C-Reactive Protein No results for input(s): CRP in the last 72 hours.  Micro Results: Recent Results (from the past 720 hour(s))  Acid Fast Smear (AFB)     Status: None   Collection Time: 08/11/18 11:20 AM  Result Value Ref Range Status   AFB Specimen Processing Concentration  Final   Acid Fast Smear Negative  Final    Comment: (NOTE) Performed At: [facility] [place], [place] [id number] Rush Farmer MD [id number]    Source (AFB) CSF  Final    Comment: Performed at [facility], [place] [address], [place], [place] [address]  Anaerobic culture     Status: None   Collection Time: 08/11/18 11:20 AM  Result Value Ref Range Status   Specimen Description CSF  Final   Special Requests NONE  Final   Culture   Final    NO ANAEROBES ISOLATED Performed  at [facility], [place] [address], [place], [place] [address]    Report Status 08/15/2018 FINAL  Final  CSF culture     Status: None   Collection Time: 08/11/18 11:20 AM  Result Value Ref Range Status   Specimen Description CSF  Final   Special Requests NONE  Final   Gram Stain NO WBC SEEN NO ORGANISMS SEEN   Final   Culture   Final    NO GROWTH 3 DAYS Performed at [facility], [place] [address], [place], [place] [address]    Report Status 08/14/2018 FINAL  Final  Fungus Culture With Stain     Status: None (Preliminary result)   Collection Time: 08/11/18 11:20 AM  Result Value Ref  Range Status   Fungus Stain Final report  Final    Comment: (NOTE) Performed At: [facility] [place], [place] [id number] Rush Farmer MD [id number]    Fungus (Mycology) Culture PENDING  Incomplete   Fungal Source CSF  Final    Comment: Performed at [facility], [place] [address], [place], [place] [address]  Andee Poles, PCR     Status: Abnormal   Collection Time: 08/11/18 11:20 AM  Result Value Ref Range Status   Toxoplasma Gondii, PCR Positive (A) Negative Final    Comment: (NOTE) Toxoplasma gondii DNA Detected. This test was developed and its performance characteristics determined by [employer]. It has not been cleared or approved by the U.S. Food and Drug Administration. The FDA has determined that such clearance or approval is not necessary. This test is used for clinical purposes. It should not be regarded as investigational or research. Performed At: [facility] [place], [place] [id number] Rush Farmer MD [id number]   Fungus Culture Result     Status: None   Collection Time: 08/11/18 11:20 AM  Result Value Ref Range Status   Result 1 Comment  Final    Comment: (NOTE) KOH/Calcofluor preparation:  no fungus observed. Performed At: [facility] [address] [place], [place] [id number] Rush Farmer MD [id number]   Culture, blood (Routine X 2) w Reflex to ID Panel     Status: None   Collection Time: 08/12/18 11:12 AM  Result Value Ref Range Status   Specimen Description BLOOD RIGHT ANTECUBITAL  Final   Special Requests   Final    BOTTLES DRAWN AEROBIC AND ANAEROBIC Blood Culture adequate volume   Culture   Final    NO GROWTH 5 DAYS Performed at [facility], [address], [place], [place] [address]    Report Status 08/17/2018 FINAL  Final  Culture, blood (Routine X 2) w Reflex to ID Panel     Status: None   Collection Time: 08/12/18 11:19 AM  Result Value Ref Range  Status   Specimen Description BLOOD LEFT ANTECUBITAL  Final   Special Requests   Final    BOTTLES DRAWN AEROBIC AND ANAEROBIC Blood Culture adequate volume   Culture   Final    NO GROWTH 5 DAYS Performed at [facility], [place] [address], [place], [place] [address]    Report Status 08/17/2018 FINAL  Final    Studies/Results: No results found.    Assessment/Plan:  INTERVAL HISTORY:   Have some improvement in his cognition compared to when I saw him last week.  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  He also has peripheral vision loss  #1 Toxoplasmosis of CNS with Encephalitis:  Continue clindamycin and continued pyrimethamine  We will provide him with 30 day supply of these meds but he will need to be treated for a year I believe  Monitor for immune reconstitution syndrome  2. HIV and AIDS: Initiated Biktarvy we will make sure he has a 30-day supplies of this as well.  Hopefully also his HMA P will be processed emergently.  He will need to fill meds in future from the [facility] [address]   #3 drug rash: Seems most likely of been due to the sulfadiazine  #4 Competency/Disability: Fortunately his father and sisters are going to provide 24-hour supervision of the patient.  I hope he has some improvement over the next coming months so that his care needs do not continue to be at the level they are right now.  I would hope that the toxoplasmosis lesions when they shrink would not help improve some of the problem such as his gaze paresis.  Hopefully his cognition will also improve and again there could be a bit of superimposed HIV encephalopathy that will get better with drop in his viral  load  I spent greater than 35  minutes with the patient including greater than 50% of time in face to face counsel of the patient and his father using the person Spanish translator and in coordination of his care.    LOS: 11 days   Alcide Evener 08/29/2018, 11:50 AM

## 2018-08-29 NOTE — Progress Notes (Signed)
Patient vomited x1 this morning at 0730. Family at bedside MD aware during his rounds. Oncoming nurse is aware.

## 2018-08-29 NOTE — Progress Notes (Signed)
Physical Therapy Session Note  Patient Details  Name: Thomas Park MRN: [id number] Date of Birth: [birth year]  Today's Date: 08/29/2018 PT Individual Time: 0930-1040 PT Individual Time Calculation (min): 70 min   Short Term Goals: Week 2:  PT Short Term Goal 1 (Week 2): = LTG  Skilled Therapeutic Interventions/Progress Updates:    pt/family education performed with pt's father. PT demonstrated and pt/father performed gait with min A in home and controlled environments, stair negotiation with min A, floor transfers and fall safety.  Pt's father continues to require cues for safety with assisting pt with gait and stairs.  Soccer ball kick in standing with pt requiring min/mod A due to fatigue and increased LT lean.  dynavision with hand over hand assist with focus on cuing for hand/eye coordination. Pt improves head in midline and able to bring eyes to midline with max cuing.  Pt performs nustep x 5 minutes for strength and endurance with orientation questions. Pt able to state his name and birthday, disoriented to time and situation.  Pt left in bed with alarm set, family present.  Therapy Documentation Precautions:  Precautions Precautions: Fall Restrictions Weight Bearing Restrictions: No Pain:  no c/o pain   Therapy/Group: Individual Therapy  DONAWERTH,KAREN 08/29/2018, 10:43 AM

## 2018-08-29 NOTE — Progress Notes (Signed)
Occupational Therapy Session Note  Patient Details  Name: Thomas Park MRN: [id number] Date of Birth: 04-24-1991  Today's Date: 08/29/2018 OT Individual Time: 2956-2130 OT Individual Time Calculation (min): 60 min   Short Term Goals: Week 2:  OT Short Term Goal 1 (Week 2): STG=LTG 2/2 ELOS  Skilled Therapeutic Interventions/Progress Updates:    Pt greeted semi-reclined in bed with father and interpreter present. Pt's father reports that pt is sleeping more at midline instead of being all the way to one side. Pt followed commands to come to sitting EOB with supervision. Stand-pivot to wc with min A and facilitation for pivot. Stand-pivot into shower with bench on R side with Mod A 2/2 difficulty locating tub bench and processing pivot. Pt noted to be sitting more at midline today during shower and initiated leaning head under water placed on R side. Dressing completed with overall set-up A to orient clothing and min A for standing balance when pulling up pants. Worked on grooming tasks at midline including toothbrushing and hair brushing. Set-up A to apply toothpaste and max verbal cues to locate hot water knob. Pt brought to therapy apartment in wc and educated pt's father on DME needs of 3-in-1 BSC and tub transfer bench. Pt needed max instructional cues and min A to pivot onto tub bench. Physical assist to lift 1 LE into tub to show pt what to do, then he was able to follow verbal commands to lift the only leg into tub. Pt returned to room at end of session and left seated in wc with safety belt on and needs met.   Therapy Documentation Precautions:  Precautions Precautions: Fall Restrictions Weight Bearing Restrictions: No Pain:  none/denies pain  Therapy/Group: Individual Therapy  Valma Cava 08/29/2018, 9:49 AM

## 2018-08-29 NOTE — Progress Notes (Signed)
Speech Language Pathology Daily Session Note  Patient Details  Name: Byford Schools MRN: [id number] Date of Birth: 12-05-1990  Today's Date: 08/29/2018 SLP Individual Time: 1300-1355 SLP Individual Time Calculation (min): 55 min  Short Term Goals: Week 2: SLP Short Term Goal 1 (Week 2): Pt will sustain his attention to basic, functional tasks for 5 minute intervals with mod assist cues for redirection.   SLP Short Term Goal 2 (Week 2): Pt will orient to place and date with max verbal cues.   SLP Short Term Goal 3 (Week 2): Pt will complete basic, familiar tasks with max assist for functional problem solving.   SLP Short Term Goal 4 (Week 2): Pt will recall basic, daily information with max assist verbal cues.   SLP Short Term Goal 5 (Week 2): Patient will scan to midline to locate items during functional tasks with Max A multimodal cues.   Skilled Therapeutic Interventions: Skilled treatment session focused on cognitive goals. SLP facilitated session by providing Max-Total A for patient to locate a specific item from a field of 2 and make a simple pattern by alternating 2 colors. Patient required total A to turn his head to midline in order to identify/find the pegs to complete the task. SLP also facilitated session by providing set-up assist for patient to feed himself a snack (yogurt, magic cup). Patient's father present and started education in regards to patient's severe cognitive impairments in strategies to utilize to maximize cognitive functioning like decreasing visual distractions, maintaining a routine and 24 hour close supervision to ensure safety. He verbalized understanding of all information. Patient left upright in bed with alarm on and all needs within reach. Continue with current plan of care.      Pain No/Denies Pain   Therapy/Group: Individual Therapy  Mckala Pantaleon 08/29/2018, 3:18 PM

## 2018-08-29 NOTE — Progress Notes (Signed)
[facility] PHYSICAL MEDICINE & REHABILITATION PROGRESS NOTE   Subjective/Complaints: Had a good weekend family states he's been more interactive and displaying improved memory. Vomited about 30" after I saw him this morning.   ROS: limited due to language/communication   Objective:   No results found. No results for input(s): WBC, HGB, HCT, PLT in the last 72 hours. No results for input(s): NA, K, CL, CO2, GLUCOSE, BUN, CREATININE, CALCIUM in the last 72 hours. No intake or output data in the 24 hours ending 08/29/18 0845   Physical Exam: Vital Signs Blood pressure (!) 103/54, pulse 88, temperature (!) 95.9 F (35.5 C), temperature source Axillary, resp. rate 19, height 5\' 8"  (1.727 m), weight 72.3 kg, SpO2 100 %.  Constitutional: No distress . Vital signs reviewed. HEENT: EOMI, oral membranes moist Neck: supple Cardiovascular: RRR without murmur. No JVD    Respiratory: CTA Bilaterally without wheezes or rales. Normal effort    GI: BS +, non-tender, non-distended  Neurological:eyes still deviated to left., more attentitve.  .  Manual muscle testing at least 4/5 bilateral deltoid bicep tricep hip flexor knee extensor ankle dorsiflexor--no changes Skin: rash resolved    Psych: flat    Assessment/Plan: 1. 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  Physiatrist is providing close team supervision and 24 hour management of active medical problems listed below.  Physiatrist and rehab team continue to assess barriers to discharge/monitor patient progress toward functional and medical goals  Care Tool:  Bathing    Body parts bathed by patient: Right arm, Left arm, Chest, Abdomen, Front perineal area, Left upper leg, Right upper leg, Right lower leg, Left lower leg, Face, Buttocks   Body parts bathed by helper: Buttocks     Bathing assist Assist Level: Moderate  Assistance - Patient 50 - 74%     Upper Body Dressing/Undressing Upper body dressing   What is the patient wearing?: Pull over shirt    Upper body assist Assist Level: Minimal Assistance - Patient > 75%    Lower Body Dressing/Undressing Lower body dressing      What is the patient wearing?: Pants     Lower body assist Assist for lower body dressing: Supervision/Verbal cueing     Toileting Toileting Toileting Activity did not occur (Clothing management and hygiene only): N/A (no void or bm)(Admitted late and did not occur on shift)  Toileting assist Assist for toileting: Moderate Assistance - Patient 50 - 74%     Transfers Chair/bed transfer  Transfers assist     Chair/bed transfer assist level: Minimal Assistance - Patient > 75%     Locomotion Ambulation   Ambulation assist      Assist level: Moderate Assistance - Patient 50 - 74% Assistive device: Hand held assist Max distance: 85   Walk 10 feet activity   Assist     Assist level: Moderate Assistance - Patient - 50 - 74% Assistive device: Hand held assist   Walk 50 feet activity   Assist Walk 50 feet with 2 turns activity did not occur: Safety/medical concerns  Assist level: Moderate Assistance - Patient - 50 - 74% Assistive device: Hand held assist    Walk 150 feet activity   Assist Walk 150 feet activity did not occur: Safety/medical concerns  Assist level: Minimal Assistance - Patient > 75%      Walk 10 feet on uneven surface  activity   Assist Walk 10  feet on uneven surfaces activity did not occur: Safety/medical concerns         Wheelchair     Assist Will patient use wheelchair at discharge?: No             Wheelchair 50 feet with 2 turns activity    Assist            Wheelchair 150 feet activity     Assist          Medical Problem List and Plan: 1.Decreased functional mobility with cognitive deficitssecondary to acute encephalopathy  related toxoplasmosis in the setting of immunosuppression due to recently diagnosed HIV.   -Continue CIR therapies including PT, OT    -Follow-up per infectious disease.   -Currently completing a course of clindamycin and Mepron 2. DVT Prophylaxis/Anticoagulation: SCDs 3. Pain Management:Tylenol as needed 4. Mood:Provide emotional support 5. Neuropsych: This patientis notcapable of making decisions on hisown behalf. 6. Skin/Wound Care:likely drug related---improved  -urecholine stopped  -benadryl 25mg  q6 prn  -sulfa held   -added sarna for topical relief 7. Fluids/Electrolytes/Nutrition:  Encourage PO  - supp  For hypalbuminemia 8.HIV. plan per ID 9. Urinary retention. Improved emptying and continence   10. History of polysubstance abuse. Urine drug screen negative 12.  Injected sclera  Naphcon helped  LOS: 11 days A FACE TO [place] 08/29/2018, 8:45 AM

## 2018-08-30 ENCOUNTER — Inpatient Hospital Stay (HOSPITAL_COMMUNITY): Payer: Self-pay | Admitting: Physical Therapy

## 2018-08-30 ENCOUNTER — Inpatient Hospital Stay (HOSPITAL_COMMUNITY): Payer: Self-pay | Admitting: Occupational Therapy

## 2018-08-30 ENCOUNTER — Inpatient Hospital Stay (HOSPITAL_COMMUNITY): Payer: Self-pay | Admitting: Speech Pathology

## 2018-08-30 MED ORDER — NONFORMULARY OR COMPOUNDED ITEM: 1.0000 | Freq: Every morning | 11 refills | Status: DC

## 2018-08-30 MED ORDER — BICTEGRAVIR-EMTRICITAB-TENOFOV 50-200-25 MG PO TABS: 1.0000 | ORAL_TABLET | Freq: Every day | ORAL | 11 refills | Status: DC

## 2018-08-30 MED ORDER — DAPSONE 100 MG PO TABS: 100.0000 mg | ORAL_TABLET | Freq: Every day | ORAL | 11 refills | Status: DC

## 2018-08-30 MED ORDER — DAPSONE 100 MG PO TABS: 100.0000 mg | ORAL_TABLET | Freq: Every day | ORAL | 0 refills | Status: DC

## 2018-08-30 MED ORDER — ONDANSETRON HCL 4 MG PO TABS: 4.0000 mg | ORAL_TABLET | Freq: Four times a day (QID) | ORAL | 1 refills | Status: DC | PRN

## 2018-08-30 MED ORDER — ONDANSETRON HCL 4 MG PO TABS: 4.0000 mg | ORAL_TABLET | Freq: Four times a day (QID) | ORAL | 6 refills | Status: DC | PRN

## 2018-08-30 MED ORDER — CLINDAMYCIN HCL 300 MG PO CAPS: 600.0000 mg | ORAL_CAPSULE | Freq: Four times a day (QID) | ORAL | 11 refills | Status: DC

## 2018-08-30 MED ORDER — DAPSONE 25 MG PO TABS
50.0000 mg | ORAL_TABLET | Freq: Every day | ORAL | Status: DC
  Administered 2018-08-30 – 2018-09-02 (×4): 50 mg via ORAL
  Filled 2018-08-30 (×5): qty 2

## 2018-08-30 NOTE — Progress Notes (Signed)
Physical Therapy Session Note  Patient Details  Name: Thomas Park MRN: [id number] Date of Birth: 1991-06-23  Today's Date: 08/30/2018 PT Individual Time: 0930-1040 PT Individual Time Calculation (min): 70 min   Short Term Goals: Week 2:  PT Short Term Goal 1 (Week 2): = LTG  Skilled Therapeutic Interventions/Progress Updates:    pt and sister participated in pt/family education with pt's sister educated on and performing transfers and gait at min A level.  Pt's sister educated on safety concerns for balance and mobility, sister demonstrates understanding.  Pt performs standing tap ups and step ups for balance training with min/mod A.  Seated ball toss with focus on midline with head and eyes. Pt able to have head in midline 75% of time and eyes able to come to midline 30% of time with max cuing.  Standing balance and midline vision with cornhole task with min/mod A for balance max cuing for midline.  Standing kinetron for strength and endurance with pt requiring encouragement but able to perform 3 x 2 minutes.  Seated card task for visual scanning with pt using compensatory methods and using tactile cues to locate cards. Once pt is holding cards he is able to see cards and identify which card has a higher value with 100% accuracy.  Pt left in w/c with alarm set, family present, needs at hand.  Therapy Documentation Precautions:  Precautions Precautions: Fall Restrictions Weight Bearing Restrictions: No Pain:  no c/o pain   Therapy/Group: Individual Therapy  Daneisha Surges 08/30/2018, 11:24 AM

## 2018-08-30 NOTE — Progress Notes (Signed)
Occupational Therapy Session Note  Patient Details  Name: Thomas Park MRN: [id number] Date of Birth: June 28, 1991  Today's Date: 08/30/2018 OT Individual Time: 1100-1155 OT Individual Time Calculation (min): 55 min   Short Term Goals: Week 2:  OT Short Term Goal 1 (Week 2): STG=LTG 2/2 ELOS  Skilled Therapeutic Interventions/Progress Updates:    Pt greeted sitting in wc with interpreter and sister present. Interpreter only present for 1/2 of session 2/2 scheduling. Pt completed stand-pivot into shower with min A and multimodal cues to locate tub bench on R side. Bathing completed with overall min A and verbal cues to wash all body parts. Improved ability to stay at midline for 30% of shower. Dressing completed with focus on visual scanning to locate clothing items.  Toothbrushing task seated in wc with use of mirror feedback to help bring pt awareness to midline. Pt brought to tub room and OT educated pts sister on tub bench transfer with pt completing with min A and multimodal cues. Standing balance and visual scanning with placing horse shoes on basketball goal at midline. Pt able to look to midline and place horse shoes with min verbal cues. Improved ability to maintain head and gaze at midline. Dynavision activity with pt able to hit 5 lights in a row on L side accurately without verbal cues from therapist. Pt returned to room and left seated in wc with safety belt on, sister present, and lunch set-up.   Therapy Documentation Precautions:  Precautions Precautions: Fall Restrictions Weight Bearing Restrictions: No Pain:   none/denies pain  Therapy/Group: Individual Therapy  Valma Cava 08/30/2018, 12:04 PM

## 2018-08-30 NOTE — Progress Notes (Signed)
Subjective: No new complaints Antibiotics:  Anti-infectives (From admission, onward)   Start     Dose/Rate Route Frequency Ordered Stop   08/30/18 1000  dapsone tablet 50 mg     50 mg Oral Daily 08/30/18 0952     08/30/18 0000  bictegravir-emtricitabine-tenofovir AF (BIKTARVY) 50-200-25 MG TABS tablet     1 tablet Oral Daily 08/29/18 1438     08/29/18 0000  bictegravir-emtricitabine-tenofovir AF (BIKTARVY) 50-200-25 MG TABS tablet    Note to Pharmacy:  ID: [id number] BIN: [id number] RxGroup: [id number] PCN: [id number]   1 tablet Oral Daily 08/29/18 1438     08/29/18 0000  clindamycin (CLEOCIN) 300 MG capsule     600 mg Oral Every 6 hours 08/29/18 1438 09/28/18 2359   08/25/18 1300  bictegravir-emtricitabine-tenofovir AF (BIKTARVY) 50-200-25 MG per tablet 1 tablet     1 tablet Oral Daily 08/25/18 1050     08/24/18 1200  clindamycin (CLEOCIN) capsule 600 mg     600 mg Oral Every 6 hours 08/24/18 0930     08/18/18 1930  sulfaDIAZINE tablet 1,500 mg  Status:  Discontinued     1,500 mg Oral Every 6 hours 08/18/18 1922 08/24/18 0930      Medications: Scheduled Meds: . bictegravir-emtricitabine-tenofovir AF  1 tablet Oral Daily  . clindamycin  600 mg Oral Q6H  . dapsone  50 mg Oral Daily  . feeding supplement  1 Container Oral TID BM  . multivitamins with iron  1 tablet Oral Daily  . Pyrimethamine 50mg / Leucovirin 20mg   1 capsule Oral Daily   And  . Pyrimethamine 25mg  / Leucovorin 5mg    1 capsule Oral Daily  . thiamine  100 mg Oral Daily   Continuous Infusions:  PRN Meds:.acetaminophen **OR** acetaminophen, camphor-menthol, diphenhydrAMINE, hydrocerin, naphazoline-glycerin, ondansetron **OR** ondansetron (ZOFRAN) IV, polyvinyl alcohol, sorbitol    Objective: Weight change:   Intake/Output Summary (Last 24 hours) at 08/30/2018 0955 Last data filed at 08/30/2018 0900 Gross per 24 hour  Intake 540 ml  Output 1000 ml  Net -460 ml   Blood pressure (!) 136/95, pulse 97,  temperature 98.2 F (36.8 C), temperature source Oral, resp. rate 15, height 5\' 8"  (1.727 m), weight 72.3 kg, SpO2 98 %. Temp:  [98.2 F (36.8 C)] 98.2 F (36.8 C) (12/16 2057) Pulse Rate:  [88-104] 97 (12/17 0554) Resp:  [15-16] 15 (12/17 0554) BP: (123-136)/(81-95) 136/95 (12/17 0554) SpO2:  [98 %-99 %] 98 % (12/17 0554)  Physical Exam: General: Confused but able to answer some questions. He thinks he is in a house but knows he is the [place] HEENT: anicteric sclera, he is constantly gazing left  towards midline but with staccato-like movements CVS regular rate, normal  Chest: , no wheezing, no respiratory distress Abdomen: soft non-distended,  Extremities: no edema or deformity noted bilaterally Neuro no new focal deficits  Use rash see picture 08/24/2018:    08/25/2018:          CBC:    BMET No results for input(s): NA, K, CL, CO2, GLUCOSE, BUN, CREATININE, CALCIUM in the last 72 hours.   Liver Panel  No results for input(s): PROT, ALBUMIN, AST, ALT, ALKPHOS, BILITOT, BILIDIR, IBILI in the last 72 hours.     Sedimentation Rate No results for input(s): ESRSEDRATE in the last 72 hours. C-Reactive Protein No results for input(s): CRP in the last 72 hours.  Micro Results: Recent Results (from the past 720 hour(s))  Acid Fast Smear (AFB)  Status: None   Collection Time: 08/11/18 11:20 AM  Result Value Ref Range Status   AFB Specimen Processing Concentration  Final   Acid Fast Smear Negative  Final    Comment: (NOTE) Performed At: [facility] [place], [place] [id number] Rush Farmer MD [id number]    Source (AFB) CSF  Final    Comment: Performed at [facility], [place] [address] [place], [place] [address]  Anaerobic culture     Status: None   Collection Time: 08/11/18 11:20 AM  Result Value Ref Range Status   Specimen Description CSF  Final   Special Requests NONE  Final   Culture   Final    NO ANAEROBES  ISOLATED Performed at [facility], [place] [address] [place], [place] [address]    Report Status 08/15/2018 FINAL  Final  CSF culture     Status: None   Collection Time: 08/11/18 11:20 AM  Result Value Ref Range Status   Specimen Description CSF  Final   Special Requests NONE  Final   Gram Stain NO WBC SEEN NO ORGANISMS SEEN   Final   Culture   Final    NO GROWTH 3 DAYS Performed at [facility], [place] [address] [place], [place] [address]    Report Status 08/14/2018 FINAL  Final  Fungus Culture With Stain     Status: None (Preliminary result)   Collection Time: 08/11/18 11:20 AM  Result Value Ref Range Status   Fungus Stain Final report  Final    Comment: (NOTE) Performed At: [facility] [place], [place] [id number] Rush Farmer MD [id number]    Fungus (Mycology) Culture PENDING  Incomplete   Fungal Source CSF  Final    Comment: Performed at [facility], [place] [address] [place], [place] [address]  Andee Poles, PCR     Status: Abnormal   Collection Time: 08/11/18 11:20 AM  Result Value Ref Range Status   Toxoplasma Gondii, PCR Positive (A) Negative Final    Comment: (NOTE) Toxoplasma gondii DNA Detected. This test was developed and its performance characteristics determined by [employer]. It has not been cleared or approved by the U.S. Food and Drug Administration. The FDA has determined that such clearance or approval is not necessary. This test is used for clinical purposes. It should not be regarded as investigational or research. Performed At: [facility] [facility] [place], [place] [id number] Rush Farmer MD JT:[id number]   Fungus Culture Result     Status: None   Collection Time: 08/11/18 11:20 AM  Result Value Ref Range Status   Result 1 Comment  Final    Comment: (NOTE) KOH/Calcofluor preparation:  no fungus observed. Performed At: [facility] [address] [place],  [place] [id number] Rush Farmer MD [id number]   Culture, blood (Routine X 2) w Reflex to ID Panel     Status: None   Collection Time: 08/12/18 11:12 AM  Result Value Ref Range Status   Specimen Description BLOOD RIGHT ANTECUBITAL  Final   Special Requests   Final    BOTTLES DRAWN AEROBIC AND ANAEROBIC Blood Culture adequate volume   Culture   Final    NO GROWTH 5 DAYS Performed at [facility], [address] [place], [place] [address]    Report Status 08/17/2018 FINAL  Final  Culture, blood (Routine X 2) w Reflex to ID Panel     Status: None   Collection  Time: 08/12/18 11:19 AM  Result Value Ref Range Status   Specimen Description BLOOD LEFT ANTECUBITAL  Final   Special Requests   Final    BOTTLES DRAWN AEROBIC AND ANAEROBIC Blood Culture adequate volume   Culture   Final    NO GROWTH 5 DAYS Performed at [facility], [address], [place], [place] [address]    Report Status 08/17/2018 FINAL  Final    Studies/Results: No results found.    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  He also has peripheral vision loss  #1 Toxoplasmosis of CNS with Encephalitis:  Continue clindamycin and continued pyrimethamine  We will provide him with 30 day supply of these meds but he will need to be treated for a year I believe  Monitor for immune reconstitution syndrome  2. HIV and AIDS: Initiated Biktarvy we will make sure he has a 30-day supplies of this as well.  Hopefully also his HMA P will be processed emergently.  He will need to fill meds in future from the [facility] on [address]  Add dapsone for PCP  prevention  Will bring pill box for sister to go over meds   #3 drug rash: Seems most likely of been due to the sulfadiazine  #4 Competency/Disability: Fortunately his father and sisters are going to provide 24-hour supervision of the patient.  I hope he has some improvement over the next coming months so that his care needs do not continue to be at the level they are right now.  I would hope that the toxoplasmosis lesions when they shrink would not help improve some of the problem such as his gaze paresis.  Hopefully his cognition will also improve and again there could be a bit of superimposed HIV encephalopathy that will get better with drop in his viral load      LOS: 12 days   Alcide Evener 08/30/2018, 9:55 AM

## 2018-08-30 NOTE — Plan of Care (Signed)
  Problem: Consults Goal: RH BRAIN INJURY PATIENT EDUCATION Description Description: See Patient Education module for eduction specifics Outcome: Progressing Goal: Skin Care Protocol Initiated - if Braden Score 18 or less Description If consults are not indicated, leave blank or document N/A Outcome: Progressing Goal: Nutrition Consult-if indicated Outcome: Progressing   Problem: RH BOWEL ELIMINATION Goal: RH STG MANAGE BOWEL WITH ASSISTANCE Description STG Manage Bowel with min Assistance.  Outcome: Progressing Goal: RH STG MANAGE BOWEL W/MEDICATION W/ASSISTANCE Description STG Manage Bowel with Medication with min Assistance.  Outcome: Progressing   Problem: RH BLADDER ELIMINATION Goal: RH STG MANAGE BLADDER WITH ASSISTANCE Description STG Manage Bladder With mod Assistance  Outcome: Progressing Goal: RH STG MANAGE BLADDER WITH MEDICATION WITH ASSISTANCE Description STG Manage Bladder With Medication With mod Assistance.  Outcome: Progressing   Problem: RH SKIN INTEGRITY Goal: RH STG SKIN FREE OF INFECTION/BREAKDOWN Description Patients skin will remain free from further infection or breakdown with min assist.  Outcome: Progressing Goal: RH STG MAINTAIN SKIN INTEGRITY WITH ASSISTANCE Description STG Maintain Skin Integrity With min Assistance.  Outcome: Progressing   Problem: RH SAFETY Goal: RH STG ADHERE TO SAFETY PRECAUTIONS W/ASSISTANCE/DEVICE Description STG Adhere to Safety Precautions With Mod/max Assistance/Device.  Outcome: Progressing   Problem: RH COGNITION-NURSING Goal: RH STG USES MEMORY AIDS/STRATEGIES W/ASSIST TO PROBLEM SOLVE Description STG Uses Memory Aids/Strategies With mod Assistance to Problem Solve.  Outcome: Progressing Goal: RH STG ANTICIPATES NEEDS/CALLS FOR ASSIST W/ASSIST/CUES Description STG Anticipates Needs/Calls for Assist With mod Assistance/Cues.  Outcome: Progressing   Problem: RH KNOWLEDGE DEFICIT BRAIN INJURY Goal: RH  STG INCREASE KNOWLEDGE OF SELF CARE AFTER BRAIN INJURY Description Mod assist  Outcome: Progressing   Problem: RH Vision Goal: RH LTG Vision (Specify) Outcome: Progressing

## 2018-08-30 NOTE — Discharge Instructions (Signed)
Inpatient Rehab Discharge Instructions  [place] Discharge date and time: No discharge date for patient encounter.   Activities/Precautions/ Functional Status: Activity: activity as tolerated Diet: regular diet Wound Care: none needed Functional status:  ___ No restrictions     ___ Walk up steps independently ___ 24/7 supervision/assistance   ___ Walk up steps with assistance ___ Intermittent supervision/assistance  ___ Bathe/dress independently ___ Walk with walker     _x__ Bathe/dress with assistance ___ Walk Independently    ___ Shower independently ___ Walk with assistance    ___ Shower with assistance ___ No alcohol     ___ Return to work/school ________    COMMUNITY REFERRALS UPON DISCHARGE:    Home Health:   PT     OT     ST    RN                      Agency:  [facility] Phone: [phone]   Medical Equipment/Items Ordered:  Wheelchair, commode and tub bench                                                      Agency/Supplier:  [facility] @ [phone]        Special Instructions:  no smoking alcohol or illicit drug use   My questions have been answered and I understand these instructions. I will adhere to these goals and the provided educational materials after my discharge from the hospital.  Patient/Caregiver Signature _______________________________ Date __________  Clinician Signature _______________________________________ Date __________  Please bring this form and your medication list with you to all your follow-up doctor's appointments.

## 2018-08-30 NOTE — Telephone Encounter (Signed)
Rx's provided for emergency ADAP:   1. Dapsone 100 mg QD PO   2. Clindamycin 600 mg Q6h PO  3. Zofran 4 mg ODT Q6h PRN   4. Pyrimethamine + leucovorin compound request (2 separate prescriptions)   His biktarvy has already been sent int.   Thomas Madeira, MSN, NP-C [facility] Infectious [facility] [email or web address] Pager: [phone] Office: [phone]

## 2018-08-30 NOTE — Progress Notes (Signed)
[place] PHYSICAL MEDICINE & REHABILITATION PROGRESS NOTE   Subjective/Complaints: No new problems.   ROS: Patient denies fever, rash, sore throat, blurred vision, nausea, vomiting, diarrhea, cough, shortness of breath or chest pain, joint or back pain, headache, or mood change.    Objective:   No results found. No results for input(s): WBC, HGB, HCT, PLT in the last 72 hours. No results for input(s): NA, K, CL, CO2, GLUCOSE, BUN, CREATININE, CALCIUM in the last 72 hours.  Intake/Output Summary (Last 24 hours) at 08/30/2018 0904 Last data filed at 08/29/2018 2140 Gross per 24 hour  Intake 300 ml  Output 1000 ml  Net -700 ml     Physical Exam: Vital Signs Blood pressure (!) 136/95, pulse 97, temperature 98.2 F (36.8 C), temperature source Oral, resp. rate 15, height 5\' 8"  (1.727 m), weight 72.3 kg, SpO2 98 %. Constitutional: No distress . Vital signs reviewed. HEENT: EOMI, oral membranes moist Neck: supple Cardiovascular: RRR without murmur. No JVD    Respiratory: CTA Bilaterally without wheezes or rales. Normal effort    GI: BS +, non-tender, non-distended  Neurological:eyes still deviated to left., more attentitve.  .  Manual muscle testing at least 4/5 bilateral deltoid bicep tricep hip flexor knee extensor ankle dorsiflexor--no changes Skin: rash resolved    Psych: flat    Assessment/Plan: 1. 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  Physiatrist is providing close team supervision and 24 hour management of active medical problems listed below.  Physiatrist and rehab team continue to assess barriers to discharge/monitor patient progress toward functional and medical goals  Care Tool:  Bathing    Body parts bathed by patient: Right arm, Left arm, Chest, Abdomen, Front perineal area, Left upper leg, Right upper leg, Right lower leg, Left lower  leg, Face, Buttocks   Body parts bathed by helper: Buttocks     Bathing assist Assist Level: Moderate Assistance - Patient 50 - 74%     Upper Body Dressing/Undressing Upper body dressing   What is the patient wearing?: Pull over shirt    Upper body assist Assist Level: Minimal Assistance - Patient > 75%    Lower Body Dressing/Undressing Lower body dressing      What is the patient wearing?: Pants     Lower body assist Assist for lower body dressing: Supervision/Verbal cueing     Toileting Toileting Toileting Activity did not occur (Clothing management and hygiene only): N/A (no void or bm)(Admitted late and did not occur on shift)  Toileting assist Assist for toileting: Moderate Assistance - Patient 50 - 74%     Transfers Chair/bed transfer  Transfers assist     Chair/bed transfer assist level: Minimal Assistance - Patient > 75%     Locomotion Ambulation   Ambulation assist      Assist level: Minimal Assistance - Patient > 75% Assistive device: Hand held assist Max distance: 100   Walk 10 feet activity   Assist     Assist level: Minimal Assistance - Patient > 75% Assistive device: Hand held assist   Walk 50 feet activity   Assist Walk 50 feet with 2 turns activity did not occur: Safety/medical concerns  Assist level: Minimal Assistance - Patient > 75% Assistive device: Hand held assist    Walk 150 feet activity   Assist Walk 150 feet activity did not occur: Safety/medical concerns  Assist level: Minimal Assistance - Patient > 75%  Walk 10 feet on uneven surface  activity   Assist Walk 10 feet on uneven surfaces activity did not occur: Safety/medical concerns         Wheelchair     Assist Will patient use wheelchair at discharge?: No             Wheelchair 50 feet with 2 turns activity    Assist            Wheelchair 150 feet activity     Assist          Medical Problem List and  Plan: 1.Decreased functional mobility with cognitive deficitssecondary to acute encephalopathy related toxoplasmosis in the setting of immunosuppression due to recently diagnosed HIV.   -Interdisciplinary Team Conference today     -Follow-up per infectious disease.   -Currently completing a course of clindamycin and Mepron 2. DVT Prophylaxis/Anticoagulation: SCDs 3. Pain Management:Tylenol as needed 4. Mood:Provide emotional support 5. Neuropsych: This patientis notcapable of making decisions on hisown behalf. 6. Skin/Wound Care:likely drug related---improved  -urecholine stopped  -benadryl 25mg  q6 prn  -sulfa held   -  sarna prn   7. Fluids/Electrolytes/Nutrition:  Encourage PO  - supp  For hypalbuminemia 8.HIV. plan per ID 9. Urinary retention. Improved emptying and continence   10. History of polysubstance abuse. Urine drug screen negative 12.  Injected sclera  Naphcon helped  LOS: 12 days A FACE TO FACE EVALUATION WAS PERFORMED  Meredith Staggers 08/30/2018, 9:04 AM

## 2018-08-30 NOTE — Progress Notes (Signed)
Speech Language Pathology Daily Session Note  Patient Details  Name: Thomas Park MRN: [id number] Date of Birth: 09/01/1991  Today's Date: 08/30/2018 SLP Individual Time: 1306-1401 SLP Individual Time Calculation (min): 55 min  Short Term Goals: Week 2: SLP Short Term Goal 1 (Week 2): Pt will sustain his attention to basic, functional tasks for 5 minute intervals with mod assist cues for redirection.   SLP Short Term Goal 2 (Week 2): Pt will orient to place and date with max verbal cues.   SLP Short Term Goal 3 (Week 2): Pt will complete basic, familiar tasks with max assist for functional problem solving.   SLP Short Term Goal 4 (Week 2): Pt will recall basic, daily information with max assist verbal cues.   SLP Short Term Goal 5 (Week 2): Patient will scan to midline to locate items during functional tasks with Max A multimodal cues.   Skilled Therapeutic Interventions:  Pt was seen for skilled ST targeting cognitive goals.  Upon arrival, pt was in bed but was agreeable to getting up for therapy.  He needed max assist for recovery from loss of balance due to standing up from bed too quickly.  SLP utilized the Dyn[employer]ision to address visual scanning.  It initially took pt an average of 36 seconds to identify targets; however, with pt driven goal setting and max to total assist for visual scanning, he was able to decrease his reaction speed to 5.45 seconds and found a total of 33 targets in 33 minutes.  Pt also demonstrated improved scanning to midline during functional self feeding tasks with mod assist multimodal cues and set up.  Pt was returned to bed and left with family at bedside, bed alarm set, and call bell within reach.  Continue per current plan of care.   Pain Pain Assessment Pain Scale: 0-10 Pain Score: 0-No pain  Therapy/Group: Individual Therapy  Tenasia Aull, Selinda Orion 08/30/2018, 2:01 PM

## 2018-08-30 NOTE — Addendum Note (Signed)
Addended by: [place] Callas on: 08/30/2018 12:16 PM   Modules accepted: Orders

## 2018-08-30 NOTE — Addendum Note (Signed)
Addended by: Landis Gandy on: 08/30/2018 12:27 PM   Modules accepted: Orders

## 2018-08-31 ENCOUNTER — Inpatient Hospital Stay (HOSPITAL_COMMUNITY): Payer: Self-pay | Admitting: Speech Pathology

## 2018-08-31 ENCOUNTER — Inpatient Hospital Stay (HOSPITAL_COMMUNITY): Payer: Self-pay

## 2018-08-31 NOTE — Progress Notes (Signed)
[place] PHYSICAL MEDICINE & REHABILITATION PROGRESS NOTE   Subjective/Complaints: Patient lying in bed.  Denies any pain.  Rested well apparently.  ROS: Patient denies fever, rash, sore throat, blurred vision, nausea, vomiting, diarrhea, cough, shortness of breath or chest pain, joint or back pain, headache, or mood change. .    Objective:   No results found. No results for input(s): WBC, HGB, HCT, PLT in the last 72 hours. No results for input(s): NA, K, CL, CO2, GLUCOSE, BUN, CREATININE, CALCIUM in the last 72 hours.  Intake/Output Summary (Last 24 hours) at 08/31/2018 1657 Last data filed at 08/31/2018 1233 Gross per 24 hour  Intake 780 ml  Output -  Net 780 ml     Physical Exam: Vital Signs Blood pressure 120/80, pulse 94, temperature 98 F (36.7 C), temperature source Oral, resp. rate 18, height 5\' 8"  (1.727 m), weight 72.1 kg, SpO2 100 %. Constitutional: No distress . Vital signs reviewed. HEENT: EOMI, oral membranes moist Neck: supple Cardiovascular: RRR without murmur. No JVD    Respiratory: CTA Bilaterally without wheezes or rales. Normal effort    GI: BS +, non-tender, non-distended  Neurological:eyes still deviated to left but could track almost to the midline today for me.  Attention improving.  Much more alert and initiating more.   Manual muscle testing at least 4/5 bilateral deltoid bicep tricep hip flexor knee extensor ankle dorsiflexor--stable exam Skin: rash resolved    Psych: More engaged    Assessment/Plan: 1. 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  Physiatrist is providing close team supervision and 24 hour management of active medical problems listed below.  Physiatrist and rehab team continue to assess barriers to discharge/monitor patient progress toward functional and medical goals  Care Tool:  Bathing    Body parts  bathed by patient: Right arm, Left arm, Chest, Abdomen, Front perineal area, Left upper leg, Right upper leg, Right lower leg, Left lower leg, Face, Buttocks   Body parts bathed by helper: Buttocks     Bathing assist Assist Level: Moderate Assistance - Patient 50 - 74%     Upper Body Dressing/Undressing Upper body dressing   What is the patient wearing?: Pull over shirt    Upper body assist Assist Level: Minimal Assistance - Patient > 75%    Lower Body Dressing/Undressing Lower body dressing      What is the patient wearing?: Pants     Lower body assist Assist for lower body dressing: Supervision/Verbal cueing     Toileting Toileting Toileting Activity did not occur (Clothing management and hygiene only): N/A (no void or bm)(Admitted late and did not occur on shift)  Toileting assist Assist for toileting: Moderate Assistance - Patient 50 - 74%     Transfers Chair/bed transfer  Transfers assist     Chair/bed transfer assist level: Contact Guard/Touching assist     Locomotion Ambulation   Ambulation assist      Assist level: Minimal Assistance - Patient > 75% Assistive device: Hand held assist Max distance: 100   Walk 10 feet activity   Assist     Assist level: Minimal Assistance - Patient > 75% Assistive device: Hand held assist   Walk 50 feet activity   Assist Walk 50 feet with 2 turns activity did not occur: Safety/medical concerns  Assist level: Minimal Assistance - Patient > 75% Assistive device: Hand held assist    Walk 150 feet activity  Assist Walk 150 feet activity did not occur: Safety/medical concerns  Assist level: Minimal Assistance - Patient > 75%      Walk 10 feet on uneven surface  activity   Assist Walk 10 feet on uneven surfaces activity did not occur: Safety/medical concerns         Wheelchair     Assist Will patient use wheelchair at discharge?: No             Wheelchair 50 feet with 2 turns  activity    Assist            Wheelchair 150 feet activity     Assist          Medical Problem List and Plan: 1.Decreased functional mobility with cognitive deficitssecondary to acute encephalopathy related toxoplasmosis in the setting of immunosuppression due to recently diagnosed HIV.   -Continue CIR therapies including PT, OT , SLP.  Making functional gains  -Follow-up per infectious disease.   -Currently completing a course of clindamycin and Mepron 2. DVT Prophylaxis/Anticoagulation: SCDs 3. Pain Management:Tylenol as needed 4. Mood:Provide emotional support 5. Neuropsych: This patientis notcapable of making decisions on hisown behalf. 6. Skin/Wound Care:Resolved  -urecholine stopped  -benadryl 25mg  q6 prn  -sulfa held   -  sarna prn   7. Fluids/Electrolytes/Nutrition:  Encourage PO  - supp  For hypalbuminemia 8.HIV. plan per ID 9. Urinary retention. Improved emptying and continence   10. History of polysubstance abuse. Urine drug screen negative 12.  Injected sclera  Naphcon helped  LOS: 13 days A FACE TO FACE EVALUATION WAS PERFORMED  Meredith Staggers 08/31/2018, 4:57 PM

## 2018-08-31 NOTE — Plan of Care (Signed)
  Problem: Consults Goal: RH BRAIN INJURY PATIENT EDUCATION Description Description: See Patient Education module for eduction specifics Outcome: Progressing Goal: Skin Care Protocol Initiated - if Braden Score 18 or less Description If consults are not indicated, leave blank or document N/A Outcome: Progressing Goal: Nutrition Consult-if indicated Outcome: Progressing   Problem: RH BOWEL ELIMINATION Goal: RH STG MANAGE BOWEL WITH ASSISTANCE Description STG Manage Bowel with min Assistance.  Outcome: Progressing Goal: RH STG MANAGE BOWEL W/MEDICATION W/ASSISTANCE Description STG Manage Bowel with Medication with min Assistance.  Outcome: Progressing   Problem: RH BLADDER ELIMINATION Goal: RH STG MANAGE BLADDER WITH ASSISTANCE Description STG Manage Bladder With mod Assistance  Outcome: Progressing Goal: RH STG MANAGE BLADDER WITH MEDICATION WITH ASSISTANCE Description STG Manage Bladder With Medication With mod Assistance.  Outcome: Progressing   Problem: RH SKIN INTEGRITY Goal: RH STG SKIN FREE OF INFECTION/BREAKDOWN Description Patients skin will remain free from further infection or breakdown with min assist.  Outcome: Progressing Goal: RH STG MAINTAIN SKIN INTEGRITY WITH ASSISTANCE Description STG Maintain Skin Integrity With min Assistance.  Outcome: Progressing   Problem: RH SAFETY Goal: RH STG ADHERE TO SAFETY PRECAUTIONS W/ASSISTANCE/DEVICE Description STG Adhere to Safety Precautions With Mod/max Assistance/Device.  Outcome: Progressing   Problem: RH COGNITION-NURSING Goal: RH STG USES MEMORY AIDS/STRATEGIES W/ASSIST TO PROBLEM SOLVE Description STG Uses Memory Aids/Strategies With mod Assistance to Problem Solve.  Outcome: Progressing Goal: RH STG ANTICIPATES NEEDS/CALLS FOR ASSIST W/ASSIST/CUES Description STG Anticipates Needs/Calls for Assist With mod Assistance/Cues.  Outcome: Progressing   Problem: RH KNOWLEDGE DEFICIT BRAIN INJURY Goal: RH  STG INCREASE KNOWLEDGE OF SELF CARE AFTER BRAIN INJURY Description Mod assist  Outcome: Progressing   Problem: RH Vision Goal: RH LTG Vision [employer] park manager) Outcome: Progressing   Problem: Consults Goal: RH BRAIN INJURY PATIENT EDUCATION Description Description: See Patient Education module for eduction specifics Outcome: Progressing Goal: Skin Care Protocol Initiated - if Braden Score 18 or less Description If consults are not indicated, leave blank or document N/A Outcome: Progressing Goal: Nutrition Consult-if indicated Outcome: Progressing   Problem: RH BOWEL ELIMINATION Goal: RH STG MANAGE BOWEL WITH ASSISTANCE Description STG Manage Bowel with min Assistance.  Outcome: Progressing Goal: RH STG MANAGE BOWEL W/MEDICATION W/ASSISTANCE Description STG Manage Bowel with Medication with min Assistance.  Outcome: Progressing   Problem: RH BLADDER ELIMINATION Goal: RH STG MANAGE BLADDER WITH ASSISTANCE Description STG Manage Bladder With mod Assistance  Outcome: Progressing Goal: RH STG MANAGE BLADDER WITH MEDICATION WITH ASSISTANCE Description STG Manage Bladder With Medication With mod Assistance.  Outcome: Progressing   Problem: RH SKIN INTEGRITY Goal: RH STG SKIN FREE OF INFECTION/BREAKDOWN Description Patients skin will remain free from further infection or breakdown with min assist.  Outcome: Progressing Goal: RH STG MAINTAIN SKIN INTEGRITY WITH ASSISTANCE Description STG Maintain Skin Integrity With min Assistance.  Outcome: Progressing   Problem: RH SAFETY Goal: RH STG ADHERE TO SAFETY PRECAUTIONS W/ASSISTANCE/DEVICE Description STG Adhere to Safety Precautions With Mod/max Assistance/Device.  Outcome: Progressing   Problem: RH COGNITION-NURSING Goal: RH STG USES MEMORY AIDS/STRATEGIES W/ASSIST TO PROBLEM SOLVE Description STG Uses Memory Aids/Strategies With mod Assistance to Problem Solve.  Outcome: Progressing Goal: RH STG ANTICIPATES NEEDS/CALLS FOR  ASSIST W/ASSIST/CUES Description STG Anticipates Needs/Calls for Assist With mod Assistance/Cues.  Outcome: Progressing   Problem: RH KNOWLEDGE DEFICIT BRAIN INJURY Goal: RH STG INCREASE KNOWLEDGE OF SELF CARE AFTER BRAIN INJURY Description Mod assist  Outcome: Progressing   Problem: RH Vision Goal: RH LTG Vision (Specify) Outcome: Progressing

## 2018-08-31 NOTE — Progress Notes (Signed)
Speech Language Pathology Daily Session Note  Patient Details  Name: Thomas Park MRN: [id number] Date of Birth: April 04, 1991  Today's Date: 08/31/2018 SLP Individual Time: 0935-1030 SLP Individual Time Calculation (min): 55 min  Short Term Goals: Week 2: SLP Short Term Goal 1 (Week 2): Pt will sustain his attention to basic, functional tasks for 5 minute intervals with mod assist cues for redirection.   SLP Short Term Goal 2 (Week 2): Pt will orient to place and date with max verbal cues.   SLP Short Term Goal 3 (Week 2): Pt will complete basic, familiar tasks with max assist for functional problem solving.   SLP Short Term Goal 4 (Week 2): Pt will recall basic, daily information with max assist verbal cues.   SLP Short Term Goal 5 (Week 2): Patient will scan to midline to locate items during functional tasks with Max A multimodal cues.   Skilled Therapeutic Interventions: Skilled treatment session focused on cognitive goals. SLP facilitated session by providing Max A verbal cues for orientation to month and Max-Total A for problem solving/scanning during a basic calendar task. However, patient appeared more engaged and socially interactive today. Patient's father present and asking questions in regards to d/c planning. All questions answered at this time. Patient left upright in bed with alarm on and all needs within reach. Continue with current plan of care.      Pain No/Denies Pain   Therapy/Group: Individual Therapy  Lilana Blasko 08/31/2018, 11:11 AM

## 2018-08-31 NOTE — Progress Notes (Signed)
Occupational Therapy Session Note  Patient Details  Name: Thomas Park MRN: [id number] Date of Birth: 08-06-1991  Today's Date: 08/31/2018 OT Individual Time: 3112-1624 OT Individual Time Calculation (min): 60 min    Short Term Goals: Week 2:  OT Short Term Goal 1 (Week 2): STG=LTG 2/2 ELOS  Skilled Therapeutic Interventions/Progress Updates:    Session focused on midline orientation, visual scanning, and bimanual tasks at midline. Pt completed SPT to w/c from bed with CGA. Pt with no c/o pain throughout session. Pt completed oral care at sink with set up, identifying objects in R visual field via reaching, not scanning despite heavy cueing. Pt sat EOM and completed beading threading task, with scanning to R visual field required to find bead and then bring to midline to loop through string. Heavy tactile and verbal cueing for R visual attention. Attempted to have pt completed line bisection task, unable to follow directions. Then had pt complete reading task to maximize visual scanning and not compensate with reaching. Pt ended session with dynavision, with poor results, unable to scan functionally R despite improved R head turning. Pt returned to room and left supine with bed alarm set and family present.   Therapy Documentation Precautions:  Precautions Precautions: Fall Restrictions Weight Bearing Restrictions: No      Therapy/Group: Individual Therapy  Curtis Sites 08/31/2018, 11:10 AM

## 2018-08-31 NOTE — Progress Notes (Signed)
Physical Therapy Session Note  Patient Details  Name: Thomas Park MRN: [id number] Date of Birth: 01-04-1991  Today's Date: 08/31/2018 PT Individual Time: 1300-1415 PT Individual Time Calculation (min): 75 min   Short Term Goals: Week 1:  PT Short Term Goal 1 (Week 1): pt will perform functional transfers with min A PT Short Term Goal 2 (Week 1): pt will perform gait with min A x 50' in controlled environment Week 2:  PT Short Term Goal 1 (Week 2): = LTG  Skilled Therapeutic Interventions/Progress Updates:  Pt resting in bed.  Bed mobility with supervision.  Medical interpreter Nicole Kindred present for session.  Pt's father here for further education for gait on level floor and carpet, transfers to furntiure, and up/down 4" step/threshold without rail,  to simulate entry at pt's home.  Transfer training with father for furniture transfers to low recliner, high regular bed, w/c and gait on level tile and carpet, 4" curb/step x 2.  Pt required mod assist on 4" curb/step, and max cues for sequencing.    PT recommended that pt's father put one hand around pt's waist from the back, and hold pt's upper arm with other hand.  Education on best position of helper as pt turns L/R/steps backwards to sit down, use of verbal instructions/manual cues to direct pt, asking pt if he sees bed/chair/etc and point to it to ensure pt is visualizing target he is going to, and sequencing on the threshold without railing that pt will use to enter/exit home. Pt's father improved in cueing pt during session, but would benefit from further education.  Pt continues to lose his balance forward while ambulating due to poor clearance of R foot, limited vision and narrow BOS.    Pt's father was concerned because his dtr was unable to come in for further training during therapy hours.  PT indicated that he can train dtr further at home, once pt is discharged. He was agreeable.  Therapeutic activity in sitting>  standing, kicking ball with L/R feet, mod assist for balance in standing.  Pt was able to visualize moving playground ball (rolling in from his R side) and kick with L foot accurately 50 % of attempts in sitting and standing, and kick with R foot accurately 90% of attempts.   In sitting, pt sorted by color playing cards (black or red) and placed them in correct stack 50%of attempts.  Errors seemed to be from lack of attention more than perception of color.   Pt left resting in bed with needs at hand and father in room, and bed alarm set.     Therapy Documentation Precautions:  Precautions Precautions: Fall Restrictions Weight Bearing Restrictions: No   Pain: pt denies      Therapy/Group: Individual Therapy  Naesha Buckalew 08/31/2018, 5:19 PM

## 2018-08-31 NOTE — Progress Notes (Signed)
Nutrition Follow-up  INTERVENTION:   - Continue Boost Breeze po TID, each supplement provides 250 kcal and 9 grams of protein  - Continue MVI daily  - Continue yogurt TID with all meals  - Continue double protein portions with all meals  NUTRITION DIAGNOSIS:   Increased nutrient needs related to chronic illness as evidenced by estimated needs.  Ongoing  GOAL:   Patient will meet greater than or equal to 90% of their needs  Progressing  MONITOR:   PO intake, Supplement acceptance, Labs, Weight trends  REASON FOR ASSESSMENT:   Malnutrition Screening Tool    ASSESSMENT:   27 yo male, admitted to rehab with CNS cytotoxoplasmosis. PMH significant for polysubstance abuse, recent HIV dx. Hx obtained from family at bedside. Transferred to rehab 12/5.  Weight stable since admission to CIR.  Spoke with pt and pt's father via NT interpreter. Pt's father reports that pt consumed 100% of breakfast meal and that pt just ate some yogurt.  Pt endorses having a good appetite, stating, "I get hungry." Pt also endorses consuming Boost Breeze supplements when offered.  RD encouraged continued PO intake and discussed importance of adequate kcal and protein intake given increased nutrient needs.  Meal Completion: 75-100% x last 4 meals  Medications reviewed and include: Boost Breeze TID (pt accepting >50%), MVI with iron, thiamine  Labs reviewed.  Diet Order:   Diet Order            Diet regular Room service appropriate? Yes; Fluid consistency: Thin; Fluid restriction: 1500 mL Fluid  Diet effective now              EDUCATION NEEDS:   Not appropriate for education at this time  Skin:  Skin Assessment: Reviewed RN Assessment (rash to bilateral arms, bilateral legs, and back)  Last BM:  12/17 (medium type 4)  Height:   Ht Readings from Last 1 Encounters:  08/18/18 5\' 8"  (1.727 m)    Weight:   Wt Readings from Last 1 Encounters:  08/31/18 72.1 kg    Ideal Body  Weight:  70 kg  BMI:  Body mass index is 24.18 kg/m.  Estimated Nutritional Needs:   Kcal:  2100-2300  Protein:  100-115 grams  Fluid:  per MD    Gaynell Face, MS, RD, LDN Inpatient Clinical Dietitian Pager: [phone] Weekend/After Hours: [phone]

## 2018-09-01 ENCOUNTER — Inpatient Hospital Stay (HOSPITAL_COMMUNITY): Payer: Self-pay | Admitting: Occupational Therapy

## 2018-09-01 ENCOUNTER — Inpatient Hospital Stay (HOSPITAL_COMMUNITY): Payer: Self-pay | Admitting: Speech Pathology

## 2018-09-01 ENCOUNTER — Inpatient Hospital Stay (HOSPITAL_COMMUNITY): Payer: Self-pay | Admitting: Physical Therapy

## 2018-09-01 LAB — COMPREHENSIVE METABOLIC PANEL
ALT: 47 U/L — ABNORMAL HIGH (ref 0–44)
AST: 44 U/L — ABNORMAL HIGH (ref 15–41)
Albumin: 3.7 g/dL (ref 3.5–5.0)
Alkaline Phosphatase: 74 U/L (ref 38–126)
Anion gap: 11 (ref 5–15)
BUN: 11 mg/dL (ref 6–20)
CO2: 24 mmol/L (ref 22–32)
Calcium: 9 mg/dL (ref 8.9–10.3)
Chloride: 100 mmol/L (ref 98–111)
Creatinine, Ser: 0.88 mg/dL (ref 0.61–1.24)
GFR calc non Af Amer: >60 mL/min (ref >60)
Glucose, Bld: 103 mg/dL — ABNORMAL HIGH (ref 70–99)
Potassium: 3.6 mmol/L (ref 3.5–5.1)
SODIUM: 135 mmol/L (ref 135–145)
Total Bilirubin: 1 mg/dL (ref 0.3–1.2)
Total Protein: 8.4 g/dL — ABNORMAL HIGH (ref 6.5–8.1)

## 2018-09-01 LAB — CBC
HCT: 41 % (ref 39.0–52.0)
Hemoglobin: 13.7 g/dL (ref 13.0–17.0)
MCH: 28 pg (ref 26.0–34.0)
MCHC: 33.4 g/dL (ref 30.0–36.0)
MCV: 83.8 fL (ref 80.0–100.0)
Platelets: 333 10*3/uL (ref 150–400)
RBC: 4.89 MIL/uL (ref 4.22–5.81)
RDW: 14.1 % (ref 11.5–15.5)
WBC: 4.9 10*3/uL (ref 4.0–10.5)
nRBC: 0 % (ref 0.0–0.2)

## 2018-09-01 NOTE — Progress Notes (Signed)
Occupational Therapy Discharge Summary  Patient Details  Name: Thomas Park MRN: [id number] Date of Birth: 10/09/1990  Today's Date: 09/01/2018 OT Individual Time: 4656-8127 OT Individual Time Calculation (min): 60 min   Pt greeted semi-reclined in bed with brother in law and interpreter present. Pt ambulated to bathroom with min HHA and verbal cues 2/2 visual deficits. Pt sat onto tub bench, doffed clothing without cues, and showered with set-up A and improved sitting balance with decreased lateral lean. Dressing and grooming tasks also completed with set-up A and CGA for balance when standing to pull up pants. Pt brought to dynavision room with pt demonstrating improved head turn to scan to the R and locate lights- up to 15 in a row with increased time and no verbal cues. Practiced ambulating into bathroom in therapy apartment with min HHA and practiced tub bench transfer with close supervision and min verbal cues for technique. Pt returned to room and left seated in wc with safety belt on and needs met.    Patient has met 13 of 13 long term goals due to improved activity tolerance, improved balance, postural control, ability to compensate for deficits, functional use of  RIGHT upper and RIGHT lower extremity, improved attention, improved awareness and improved coordination.  Patient to discharge at overall supervision/ Min Assist level.  Patient's care partner is independent to provide the necessary physical and cognitive assistance at discharge.    Reasons goals not met: n/a  Recommendation:  Patient will benefit from ongoing skilled OT services in home health setting to continue to advance functional skills in the area of BADL and Reduce care partner burden.  Equipment: tub bench, 3-in-1 BSC, wc  Reasons for discharge: treatment goals met and discharge from hospital  Patient/family agrees with progress made and goals achieved: Yes  OT Discharge Precautions/Restrictions   Precautions Precautions: Fall Restrictions Weight Bearing Restrictions: No Pain Pain Assessment Faces Pain Scale: No hurt ADL ADL Eating: Supervision/safety, Set up Grooming: Setup, Supervision/safety Upper Body Bathing: Supervision/safety Lower Body Bathing: Supervision/safety Upper Body Dressing: Supervision/safety, Setup Lower Body Dressing: Contact guard Toileting: Supervision/safety, Setup Toilet Transfer: Minimal assistance Tub/Shower Transfer: Minimal assistance Vision Eye Alignment: Impaired (comment) Ocular Range of Motion: Restricted on the right;Restricted looking up;Restricted looking down;Impaired-to be further tested in functional context Alignment/Gaze Preference: Gaze left;Head turned Additional Comments: Much improved since eval Perception  Perception: Impaired Inattention/Neglect: Does not attend to right visual field Cognition Overall Cognitive Status: Impaired/Different from baseline Arousal/Alertness: Awake/alert Orientation Level: Oriented to person Focused Attention: Appears intact Sustained Attention: Impaired Sustained Attention Impairment: Verbal basic;Functional basic Memory: Impaired Memory Impairment: Storage deficit Awareness: Impaired Awareness Impairment: Intellectual impairment Problem Solving: Impaired Problem Solving Impairment: Verbal basic;Functional basic Behaviors: Impulsive Safety/Judgment: Impaired Comments: significant visual deficits Sensation Sensation Light Touch: Impaired Detail Light Touch Impaired Details: Impaired LUE;Impaired LLE Proprioception: Impaired Detail Proprioception Impaired Details: Impaired LUE;Impaired LLE Coordination Gross Motor Movements are Fluid and Coordinated: No Fine Motor Movements are Fluid and Coordinated: No Coordination and Movement Description: Lt hemiplegia Motor  Motor Motor: Hemiplegia;Abnormal tone;Abnormal postural alignment and control Motor - Discharge Observations: Lt  hemiplegia, coordination deficits Mobility  Bed Mobility Bed Mobility: Supine to Sit Supine to Sit: Independent Transfers Sit to Stand: Minimal Assistance - Patient > 75%  Trunk/Postural Assessment  Cervical Assessment Cervical Assessment: (Lt rotation due to visual deficits) Thoracic Assessment Thoracic Assessment: Within Functional Limits Lumbar Assessment Lumbar Assessment: Within Functional Limits Postural Control Righting Reactions: delayed  Balance Balance Balance Assessed: Yes  Static Sitting Balance Static Sitting - Level of Assistance: 7: Independent Dynamic Sitting Balance Dynamic Sitting - Level of Assistance: 5: Stand by assistance Sitting balance - Comments: able to sit with min guard at times.  Static Standing Balance Static Standing - Level of Assistance: 4: Min assist Dynamic Standing Balance Dynamic Standing - Level of Assistance: 4: Min assist Extremity/Trunk Assessment RUE Assessment RUE Assessment: Within Functional Limits Brunstrum level for arm: Stage V Relative Independence from Synergy LUE Assessment LUE Assessment: Within Functional Limits Brunstrum level for arm: Stage V Relative Independence from Synergy   Daneen Schick Akayla Brass 09/01/2018, 1:57 PM

## 2018-09-01 NOTE — Discharge Summary (Signed)
NAME: Thomas Park, JAROSZEWSKI MEDICAL RECORD LN:[id number] ACCOUNT [id number] DATE OF BIRTH:03-Jun-1991 FACILITY: [facility] LOCATION: [facility] PHYSICIAN:ZACHARY SWARTZ, MD  DISCHARGE SUMMARY  DATE OF DISCHARGE:  09/02/2018  DATE OF DISCHARGE:  DISCHARGE DIAGNOSES: 1.  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 2.  Sequential compression devices for deep venous thrombosis prophylaxis.  HIV, urinary retention, improved.  History of polysubstance abuse.  HOSPITAL COURSE:  This is a 27 year old right-handed, limited English speaking male with history of polysubstance abuse, recently diagnosed HIV.  Per report lives with his sister.  Independent prior to admission.  Presented 08/10/2018 with altered mental  status unintentional weight loss times 3 months.  MRI of the brain revealed numerous masses.  Per report numerous, ring enhancing lesions greatest at the left basal ganglia classically representing toxoplasmosis.  Mild to moderate vasogenic edema with  regional mass effect greatest at the left basal ganglia.  Echocardiogram with ejection fraction of 65%, no wall motion abnormalities.  CD4 T-cell less than 10.  Managed for acute encephalopathy, likely related to toxoplasmosis in the setting of  immunosuppression due to HIV with followup for infectious disease.  Tolerating a regular diet.  Ophthalmology followup for peripheral vision loss as well as both eyes deviating towards the left lateral side felt to be secondary to intracranial lesions.   Currently advised continue to monitor.  Bouts of urinary retention slowly improved.  Therapy evaluations completed and the patient was admitted for comprehensive rehabilitation program.  PAST MEDICAL HISTORY:  See discharge diagnoses.  SOCIAL HISTORY:  Lives with his sister.  Independent prior to admission with good family support.  FUNCTIONAL STATUS:  Upon  admission to rehab services was moderate assist 50 feet 2 person handheld assist, minimal assist sit to stand, mod/max assist with ADLs.  PHYSICAL EXAMINATION: VITAL SIGNS:  Blood pressure 123/75, pulse 103, temperature 97, respirations 18. GENERAL:  Alert male, anxious.  Followed some simple commands.  Her exam limited by language barrier.  Both eyes deviated to the left.  Pupils reactive to light. NECK:  Supple, nontender, no JVD. CARDIOVASCULAR:  Rate controlled. ABDOMEN:  Soft, nontender, good bowel sounds. LUNGS:  Clear to auscultation without wheeze.  REHABILITATION HOSPITAL COURSE:  The patient was admitted to inpatient rehabilitation services.  Therapies initiated on a 3-hour daily basis, consisting of physical therapy, occupational therapy, speech therapy and rehabilitation nursing.  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  He continued to participate with his therapies.  Follow up per Infectious Disease.  Continuing medical  regimen as advised.  Currently on clindamycin and Mepron with courses completed.  Blood pressure is well controlled.    Urinary retention, improved with routine toileting.  History of polysubstance abuse.  Urine drug screen was negative.  All issues in regards to illicit drug products discussed with family.  Ongoing followup per infectious disease in light of HIV.    The patient provided with a 30-day supply of his meds as well as arranged as per case management.    He did develop a drug rash, most likely due to sulfadiazine and received Benadryl with improvement.    The patient received weekly collaborative interdisciplinary team conferences to discuss estimated length of stay, family teaching, any barriers to discharge.  Working with transfer training.  Performing transfers and  ambulation at a  minimal assist level.   Translator helped at time during Therapies performed standing taps up and down steps with min mod assist working with standing balance.  Activities of daily living and homemaking, completed stand pivot into the shower with minimal assist.  Bathing  completed overall minimal assist.  He was able to communicate simple needs and was discharged to home.  CURRENT DISCHARGE MEDICATIONS:  Included Biktarvy 50-200/25 mg 1 tab daily, clindamycin 600 mg every 6 hours, Dapsone 50 mg p.o. daily, multivitamin daily.  Pyrimethamine 1 capsule  50 mg /Leucovorin 20 mg 1 capsule daily, Tylenol as needed.  DIET:  Regular.  FOLLOWUP:  The patient would follow up with Dr. Alger Simons at the outpatient rehab service office as advised; Dr. Bobby Rumpf, infectious disease, call for appointment.  AN/NUANCE D:09/01/2018 T:09/01/2018 JOB:[id number]

## 2018-09-01 NOTE — Progress Notes (Signed)
[place] PHYSICAL MEDICINE & REHABILITATION PROGRESS NOTE   Subjective/Complaints: Patient lying in bed.  Denies any pain.  Rested well apparently.  ROS: Patient denies fever, rash, sore throat, blurred vision, nausea, vomiting, diarrhea, cough, shortness of breath or chest pain, joint or back pain, headache, or mood change. .    Objective:   No results found. No results for input(s): WBC, HGB, HCT, PLT in the last 72 hours. No results for input(s): NA, K, CL, CO2, GLUCOSE, BUN, CREATININE, CALCIUM in the last 72 hours.  Intake/Output Summary (Last 24 hours) at 09/01/2018 0959 Last data filed at 08/31/2018 2200 Gross per 24 hour  Intake 720 ml  Output -  Net 720 ml     Physical Exam: Vital Signs Blood pressure 132/88, pulse 80, temperature 99 F (37.2 C), temperature source Oral, resp. rate 19, height 5\' 8"  (1.727 m), weight 72.1 kg, SpO2 100 %. Constitutional: No distress . Vital signs reviewed. HEENT: EOMI, oral membranes moist Neck: supple,   Cardiovascular: RRR without murmur. No JVD    Respiratory: CTA Bilaterally without wheezes or rales. Normal effort    GI: BS +, non-tender, non-distended  Neurological:eyes can come more to midline.  Attention improving.  Much more alert and initiating more.   Manual muscle testing at least 4+/5 bilateral deltoid bicep tricep hip flexor knee extensor ankle dorsiflexor--gradual improvement Skin: rash resolved    Psych: More engaged    Assessment/Plan: 1. 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  Physiatrist is providing close team supervision and 24 hour management of active medical problems listed below.  Physiatrist and rehab team continue to assess barriers to discharge/monitor patient progress toward functional and medical goals  Care Tool:  Bathing    Body parts bathed by patient: Right arm, Chest,  Left arm, Front perineal area, Abdomen, Buttocks, Right upper leg, Left upper leg, Right lower leg, Left lower leg, Face   Body parts bathed by helper: Buttocks     Bathing assist Assist Level: Supervision/Verbal cueing     Upper Body Dressing/Undressing Upper body dressing   What is the patient wearing?: Pull over shirt    Upper body assist Assist Level: Set up assist    Lower Body Dressing/Undressing Lower body dressing      What is the patient wearing?: Pants     Lower body assist Assist for lower body dressing: Supervision/Verbal cueing     Toileting Toileting Toileting Activity did not occur (Clothing management and hygiene only): N/A (no void or bm)(Admitted late and did not occur on shift)  Toileting assist Assist for toileting: Minimal Assistance - Patient > 75%     Transfers Chair/bed transfer  Transfers assist     Chair/bed transfer assist level: Minimal Assistance - Patient > 75%     Locomotion Ambulation   Ambulation assist      Assist level: Minimal Assistance - Patient > 75% Assistive device: Hand held assist Max distance: 25   Walk 10 feet activity   Assist     Assist level: Minimal Assistance - Patient > 75% Assistive device: Hand held assist   Walk 50 feet activity   Assist Walk 50 feet with 2 turns activity did not occur: Safety/medical concerns  Assist level: Minimal Assistance - Patient > 75% Assistive device: Hand held assist    Walk 150 feet activity   Assist Walk 150 feet activity did not occur: Safety/medical concerns  Assist level: Minimal Assistance - Patient > 75%      Walk 10 feet on uneven surface  activity   Assist Walk 10 feet on uneven surfaces activity did not occur: Safety/medical concerns         Wheelchair     Assist Will patient use wheelchair at discharge?: No             Wheelchair 50 feet with 2 turns activity    Assist            Wheelchair 150 feet activity      Assist          Medical Problem List and Plan: 1.Decreased functional mobility with cognitive deficitssecondary to acute encephalopathy related toxoplasmosis in the setting of immunosuppression due to recently diagnosed HIV.   -Continue CIR therapies including PT, OT , SLP.  Making neurological gains  -ELOS 12/20  -Patient to see Rehab MD/provider in the office for transitional care encounter in 1-2 weeks.   -Follow-up per infectious disease.   -Currently completing a course of clindamycin and Mepron 2. DVT Prophylaxis/Anticoagulation: SCDs 3. Pain Management:Tylenol as needed 4. Mood:Provide emotional support 5. Neuropsych: This patientis notcapable of making decisions on hisown behalf. 6. Skin/Wound Care:Resolved  -urecholine stopped  -benadryl 25mg  q6 prn  -sulfa held   -  sarna prn   7. Fluids/Electrolytes/Nutrition:  Encourage PO  - supp  For hypalbuminemia 8.HIV. plan per ID, family appears unaware of this diagnosis 9. Urinary retention. Improved emptying and continence   10. History of polysubstance abuse. Urine drug screen negative 12.  Injected sclera  Naphcon helped  LOS: 14 days A FACE TO FACE EVALUATION WAS PERFORMED  Meredith Staggers 09/01/2018, 9:59 AM

## 2018-09-01 NOTE — Plan of Care (Signed)
  Problem: Consults Goal: RH BRAIN INJURY PATIENT EDUCATION Description Description: See Patient Education module for eduction specifics Outcome: Progressing Goal: Skin Care Protocol Initiated - if Braden Score 18 or less Description If consults are not indicated, leave blank or document N/A Outcome: Progressing Goal: Nutrition Consult-if indicated Outcome: Progressing   Problem: RH BOWEL ELIMINATION Goal: RH STG MANAGE BOWEL WITH ASSISTANCE Description STG Manage Bowel with min Assistance.  Outcome: Progressing Goal: RH STG MANAGE BOWEL W/MEDICATION W/ASSISTANCE Description STG Manage Bowel with Medication with min Assistance.  Outcome: Progressing   Problem: RH BLADDER ELIMINATION Goal: RH STG MANAGE BLADDER WITH ASSISTANCE Description STG Manage Bladder With mod Assistance  Outcome: Progressing Goal: RH STG MANAGE BLADDER WITH MEDICATION WITH ASSISTANCE Description STG Manage Bladder With Medication With mod Assistance.  Outcome: Progressing   Problem: RH SKIN INTEGRITY Goal: RH STG SKIN FREE OF INFECTION/BREAKDOWN Description Patients skin will remain free from further infection or breakdown with min assist.  Outcome: Progressing Goal: RH STG MAINTAIN SKIN INTEGRITY WITH ASSISTANCE Description STG Maintain Skin Integrity With min Assistance.  Outcome: Progressing   Problem: RH SAFETY Goal: RH STG ADHERE TO SAFETY PRECAUTIONS W/ASSISTANCE/DEVICE Description STG Adhere to Safety Precautions With Mod/max Assistance/Device.  Outcome: Progressing   Problem: RH COGNITION-NURSING Goal: RH STG USES MEMORY AIDS/STRATEGIES W/ASSIST TO PROBLEM SOLVE Description STG Uses Memory Aids/Strategies With mod Assistance to Problem Solve.  Outcome: Progressing Goal: RH STG ANTICIPATES NEEDS/CALLS FOR ASSIST W/ASSIST/CUES Description STG Anticipates Needs/Calls for Assist With mod Assistance/Cues.  Outcome: Progressing   Problem: RH KNOWLEDGE DEFICIT BRAIN INJURY Goal: RH  STG INCREASE KNOWLEDGE OF SELF CARE AFTER BRAIN INJURY Description Mod assist  Outcome: Progressing   Problem: RH Vision Goal: RH LTG Vision (Specify) Outcome: Progressing

## 2018-09-01 NOTE — Progress Notes (Signed)
Speech Language Pathology Discharge Summary  Patient Details  Name: Thomas Park MRN: [id number] Date of Birth: 1991/02/14  Today's Date: 09/01/2018 SLP Individual Time: 1300-1355 SLP Individual Time Calculation (min): 55 min   Skilled Therapeutic Interventions: Skilled treatment session focused on family education and cognitive goals. SLP facilitated session by providing education to the patient's sister in regards to his current cognitive functioning and strategies to utilize at home to maximize recall, problem solving and overall safety at home, especially the importance of close 24 hour supervision. She verbalized understanding of all information. SLP also facilitated session with a basic card game in which patient was able to identify the numbers on the card at midline and demonstrated basic problem solving throughout task with overall Min A verbal cues. Patient also demonstrated increased sustained attention to tasks for ~15 minutes with Min A verbal cues for redirection. Patient left upright in bed with alarm on and all needs within reach. Continue with current plan of care.   Patient has met 4 of 4 long term goals.  Patient to discharge at overall Max level.   Reasons goals not met: N/A   Clinical Impression/Discharge Summary: Patient has made gains and has met 4 of 4 LTGs this admission. Currently, patient requires overall Max A multimodal cues to complete functional and familiar tasks safely in regards to sustained attention, recall, functional problem solving, intellectual awareness and visual scanning. Patient and family education is complete and patient will discharge home with 24 hour supervision. Patient would benefit from f/u SLP services to maximize his cognitive functioning and overall functional independence in order to reduce caregiver burden.   Care Partner:     Type of Caregiver Assistance: Physical;Cognitive  Recommendation:  Home Health SLP;24 hour  supervision/assistance  Rationale for SLP Follow Up: Maximize cognitive function and independence;Reduce caregiver burden   Equipment: N/A   Reasons for discharge: Treatment goals met;Discharged from hospital   Patient/Family Agrees with Progress Made and Goals Achieved: Yes    [place], [place] 09/01/2018, 3:41 PM

## 2018-09-01 NOTE — Progress Notes (Signed)
Subjective: No new complaints Antibiotics:  Anti-infectives (From admission, onward)   Start     Dose/Rate Route Frequency Ordered Stop   08/30/18 1000  dapsone tablet 50 mg     50 mg Oral Daily 08/30/18 0952     08/30/18 0000  bictegravir-emtricitabine-tenofovir AF (BIKTARVY) 50-200-25 MG TABS tablet     1 tablet Oral Daily 08/29/18 1438     08/30/18 0000  dapsone 100 MG tablet  Status:  Discontinued     100 mg Oral Daily 08/30/18 1010 08/30/18    08/29/18 0000  bictegravir-emtricitabine-tenofovir AF (BIKTARVY) 50-200-25 MG TABS tablet  Status:  Discontinued    Note to Pharmacy:  ID: [id number] BIN: [id number] RxGroup: [id number] PCN: [id number]   1 tablet Oral Daily 08/29/18 1438 08/30/18    08/29/18 0000  clindamycin (CLEOCIN) 300 MG capsule  Status:  Discontinued     600 mg Oral Every 6 hours 08/29/18 1438 08/30/18    08/25/18 1300  bictegravir-emtricitabine-tenofovir AF (BIKTARVY) 50-200-25 MG per tablet 1 tablet     1 tablet Oral Daily 08/25/18 1050     08/24/18 1200  clindamycin (CLEOCIN) capsule 600 mg     600 mg Oral Every 6 hours 08/24/18 0930     08/18/18 1930  sulfaDIAZINE tablet 1,500 mg  Status:  Discontinued     1,500 mg Oral Every 6 hours 08/18/18 1922 08/24/18 0930      Medications: Scheduled Meds: . bictegravir-emtricitabine-tenofovir AF  1 tablet Oral Daily  . clindamycin  600 mg Oral Q6H  . dapsone  50 mg Oral Daily  . feeding supplement  1 Container Oral TID BM  . multivitamins with iron  1 tablet Oral Daily  . Pyrimethamine 50mg / Leucovirin 20mg   1 capsule Oral Daily   And  . Pyrimethamine 25mg  / Leucovorin 5mg    1 capsule Oral Daily  . thiamine  100 mg Oral Daily   Continuous Infusions:  PRN Meds:.acetaminophen **OR** acetaminophen, camphor-menthol, diphenhydrAMINE, hydrocerin, naphazoline-glycerin, ondansetron **OR** ondansetron (ZOFRAN) IV, polyvinyl alcohol, sorbitol    Objective: Weight change:   Intake/Output Summary (Last 24 hours) at  09/01/2018 1750 Last data filed at 09/01/2018 1230 Gross per 24 hour  Intake 960 ml  Output -  Net 960 ml   Blood pressure (!) 147/82, pulse 100, temperature 99 F (37.2 C), temperature source Oral, resp. rate 16, height 5\' 8"  (1.727 m), weight 72.1 kg, SpO2 99 %. Temp:  [97.7 F (36.5 C)-99 F (37.2 C)] 99 F (37.2 C) (12/19 0420) Pulse Rate:  [80-100] 100 (12/19 1616) Resp:  [16-19] 16 (12/19 1616) BP: (119-147)/(80-88) 147/82 (12/19 1616) SpO2:  [98 %-100 %] 99 % (12/19 1616)  Physical Exam: General: Confused but able to answer some questions. He thinks he is in a house but knows he is the [place] HEENT: anicteric sclera, he is constantly gazing left  towards midline but with staccato-like movements CVS regular rate, normal  Chest: , no wheezing, no respiratory distress Abdomen: soft non-distended,  Extremities: no edema or deformity noted bilaterally Neuro no new focal deficits He knew he was in [place] today and recognizes sister sitting next to him.    CBC:    BMET Recent Labs    09/01/18 1036  NA 135  K 3.6  CL 100  CO2 24  GLUCOSE 103*  BUN 11  CREATININE 0.88  CALCIUM 9.0     Liver Panel  Recent Labs    09/01/18 1036  PROT 8.4*  ALBUMIN 3.7  AST 44*  ALT 47*  ALKPHOS 74  BILITOT 1.0       Sedimentation Rate No results for input(s): ESRSEDRATE in the last 72 hours. C-Reactive Protein No results for input(s): CRP in the last 72 hours.  Micro Results: Recent Results (from the past 720 hour(s))  Acid Fast Smear (AFB)     Status: None   Collection Time: 08/11/18 11:20 AM  Result Value Ref Range Status   AFB Specimen Processing Concentration  Final   Acid Fast Smear Negative  Final    Comment: (NOTE) Performed At: [facility] [place], [place] [id number] Rush Farmer MD [id number]    Source (AFB) CSF  Final    Comment: Performed at [facility], [place] [address], [place], [place] [address]  Anaerobic culture      Status: None   Collection Time: 08/11/18 11:20 AM  Result Value Ref Range Status   Specimen Description CSF  Final   Special Requests NONE  Final   Culture   Final    NO ANAEROBES ISOLATED Performed at [facility], [place] [address], [place], [place] [address]    Report Status 08/15/2018 FINAL  Final  CSF culture     Status: None   Collection Time: 08/11/18 11:20 AM  Result Value Ref Range Status   Specimen Description CSF  Final   Special Requests NONE  Final   Gram Stain NO WBC SEEN NO ORGANISMS SEEN   Final   Culture   Final    NO GROWTH 3 DAYS Performed at [facility], [place] [address], [place], [place] [address]    Report Status 08/14/2018 FINAL  Final  Fungus Culture With Stain     Status: None (Preliminary result)   Collection Time: 08/11/18 11:20 AM  Result Value Ref Range Status   Fungus Stain Final report  Final    Comment: (NOTE) Performed At: [facility] [place], [place] [id number] Rush Farmer MD [id number]    Fungus (Mycology) Culture PENDING  Incomplete   Fungal Source CSF  Final    Comment: Performed at [facility], [place] [address], [place], [place] [address]  Andee Poles, PCR     Status: Abnormal   Collection Time: 08/11/18 11:20 AM  Result Value Ref Range Status   Toxoplasma Gondii, PCR Positive (A) Negative Final    Comment: (NOTE) Toxoplasma gondii DNA Detected. This test was developed and its performance characteristics determined by [employer]. It has not been cleared or approved by the U.S. Food and Drug Administration. The FDA has determined that such clearance or approval is not necessary. This test is used for clinical purposes. It should not be regarded as investigational or research. Performed At: [facility] [place], [place] [id number] Rush Farmer MD [id number]   Fungus Culture Result     Status: None   Collection Time: 08/11/18 11:20 AM    Result Value Ref Range Status   Result 1 Comment  Final    Comment: (NOTE) KOH/Calcofluor preparation:  no fungus observed. Performed At: [facility] [address] [place], [place] [id number] Rush Farmer MD GO:[id number]   Culture, blood (Routine X 2) w Reflex to ID Panel     Status: None   Collection Time: 08/12/18 11:12 AM  Result Value Ref Range Status   Specimen Description BLOOD RIGHT ANTECUBITAL  Final   Special Requests   Final  BOTTLES DRAWN AEROBIC AND ANAEROBIC Blood Culture adequate volume   Culture   Final    NO GROWTH 5 DAYS Performed at [facility], [place] [address], [place], [place] [address]    Report Status 08/17/2018 FINAL  Final  Culture, blood (Routine X 2) w Reflex to ID Panel     Status: None   Collection Time: 08/12/18 11:19 AM  Result Value Ref Range Status   Specimen Description BLOOD LEFT ANTECUBITAL  Final   Special Requests   Final    BOTTLES DRAWN AEROBIC AND ANAEROBIC Blood Culture adequate volume   Culture   Final    NO GROWTH 5 DAYS Performed at [facility], [place] [address] [place], [place] [address]    Report Status 08/17/2018 FINAL  Final    Studies/Results: No results found.    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  He also has peripheral vision loss  #1 Toxoplasmosis of CNS with Encephalitis:  Continue clindamycin and continued pyrimethamine  We will provide him with 30 day supply of these meds but he will need to be treated for a year I believe  Monitor for immune reconstitution syndrome  2. HIV and AIDS: Initiated  Biktarvy we will make sure he has a 30-day supplies of this as well.  Hopefully also his HMA P will be processed emergently.  He will need to fill meds in future from the [facility] [address]  dapsone for PCP prevention   #3 drug rash: Seems most likely of been due to the sulfadiazine  GREATLY APPRECIATE ID PHARMACY GOING OVER MEDS W PTS SISTER WITH PILLBOX  Patient has appointment with me on September 21, 2018 at 9:15 in the morning        LOS: 14 days   Alcide Evener 09/01/2018, 5:50 PM

## 2018-09-01 NOTE — Patient Care Conference (Signed)
Inpatient RehabilitationTeam Conference and Plan of Care Update Date: 08/30/2018   Time: 2:35 PM    Patient Name: Thomas Park Record Number: [id number]  Date of Birth: Jun 08, 1991 Sex: Male         Room/Bed: 4W09C/4W09C-01 Payor Info: Payor: MEDICAID POTENTIAL / Plan: MEDICAID POTENTIAL / Product Type: *No Product type* /    Admitting Diagnosis: Encephalitis  Admit Date/Time:  08/18/2018  5:22 PM Admission Comments: No comment available   Primary Diagnosis:  <principal problem not specified> Principal Problem: <principal problem not specified>  Patient Active Problem List   Diagnosis Date Noted  . Drug rash   . Neurologic gait disorder   . Brain lesion   . Confusion   . Hyponatremia   . Substance abuse ([place])   . Cerebritis 08/10/2018  . AIDS ([place])   . CNS toxoplasmosis [facility])     Expected Discharge Date: Expected Discharge Date: 09/02/18  Team Members Present: Physician leading conference: Dr. Alger Simons Social Worker Present: Lennart Pall, LCSW Nurse Present: Dorien Chihuahua, RN PT Present: Roderic Ovens, PT OT Present: Cherylynn Ridges, OT SLP Present: Weston Anna, SLP PPS Coordinator present : Daiva Nakayama, RN, CRRN;Melissa Gertie Fey     Current Status/Progress Goal Weekly Team Focus  Medical   Patient demonstrating improvement cognitively.  Ongoing deviation of eyes and balance dysfunction.  Patient developed a drug rash which improved with adjustment of medications     Cognition and nutrition.  ID management per the ID team   Bowel/Bladder   Contient of bowel and bladder. irregular pattern of voiding, voids large amounts at leaxst once a shift.    Regain regular bowel and urinary pattern.   Assist with toileting needs and monitor for need for intervention.    Swallow/Nutrition/ Hydration             ADL's   Supervision/set-up to min A overall for BADLs, min/mod A transfers,  Supervision overall  Positioning, visual scanning, modified  bathing/dressing, dc planning, pt family education   Mobility   min A gait and transfers  min A overall  family education, d/c planning   Communication             Safety/Cognition/ Behavioral Observations  max assist  mod assist   visual scanning, awareness of deficits, safety awareness, recall of daily information   Pain   Denies pain this shift.   Pain <3 on scale of 1-10.   Assess pain every shift and as needed   Skin   generalized rash and scratches receiving prn benadryl and sarna lotion.   No further areas of skin impairment.   Assess skin every shift and as needed.     Rehab Goals Patient on target to meet rehab goals: Yes *See Care Plan and progress notes for long and short-term goals.     Barriers to Discharge  Current Status/Progress Possible Resolutions Date Resolved   Physician    Medical stability        Ongoing cognitive deficits will require supervision at home      Nursing                  PT                    OT                  SLP  SW                Discharge Planning/Teaching Needs:  Plan home with sister and other family members able to provide 24/7 supervision  Teaching ongoing with assistance of interpreter   Team Discussion:  Medially improving;  ID continues to follow closely.  Visual deficits appear to be improving as well.  Making neurological progress.  Min assist overall with ADLs and gait.  Family ed being completed.  Revisions to Treatment Plan:  NA    Continued Need for Acute Rehabilitation Level of Care: The patient requires daily medical management by a physician with specialized training in physical medicine and rehabilitation for the following conditions: Daily direction of a multidisciplinary physical rehabilitation program to ensure safe treatment while eliciting the highest outcome that is of practical value to the patient.: Yes Daily medical management of patient stability for increased activity during  participation in an intensive rehabilitation regime.: Yes Daily analysis of laboratory values and/or radiology reports with any subsequent need for medication adjustment of medical intervention for : Neurological problems;Other   I attest that I was present, lead the team conference, and concur with the assessment and plan of the team.   Wai Minotti 09/01/2018, 2:33 PM

## 2018-09-01 NOTE — Progress Notes (Signed)
Social Work Patient ID: Aqil Goetting, male   DOB: 31-Jan-1991, 28 y.o.   MRN: [id number]   Met yesterday with pt and father, via interpreter, to review team conference and dc planning arrangements. Father reports family is seeing improvements and ready for d/c end of week.  Have explained that I have made arrangements for [facility], PT, OT, ST follow up and have ordered DME.  Have spoken with Janene Madeira, NP with ID about medications needed for d/c and medical follow up.  I have placed pt in the [facility] program and the [facility] pharmacy will provide with all needed meds for d/c.   I have, also, spoken with [facility], however, they are not taking new patients at this time.  They referred me to Primary Care @ [facility] who are able to take pt as a new referral. This location will serve as his primary medical "home".    Leng Montesdeoca, LCSW

## 2018-09-01 NOTE — Progress Notes (Signed)
Physical Therapy Discharge Summary  Patient Details  Name: Thomas Park MRN: [id number] Date of Birth: May 09, 1991  Today's Date: 09/01/2018 PT Individual Time: 0930-1040 PT Individual Time Calculation (min): 70 min   Session focused on pt/family education with pt's sister. Pt's sister was educated and performed gait, car transfers and fall recovery with pt.  Also was educated on energy conservation and safety ideas for home.  Pt able to perform gait, transfers, car transfer and floor transfer all with min A throughout session.  Standing and seated balance with horseshoe and ball toss and kick tasks with pt min/mod A for standing balance, mod/max cuing for tracking objects to midline with eyes.  Standing and seated dynavision task with pt much improved, able to locate lights without assistance 50% of trials.  Pt/sister state they feel prepared for d/c home tomorrow at this level of care.   Patient has met 6 of 9 long term goals due to improved activity tolerance, improved balance, improved postural control, increased strength, ability to compensate for deficits and improved attention.  Patient to discharge at an ambulatory level [place].   Patient's care partner is independent to provide the necessary physical and cognitive assistance at discharge.  Reasons goals not met: pt continues with deficits in attention and awareness as well as seated and standing balance  Recommendation:  Patient will benefit from ongoing skilled PT services in home health setting to continue to advance safe functional mobility, address ongoing impairments in balance, coordination, strength, gait, and minimize fall risk.  Equipment: w/c  Reasons for discharge: treatment goals met and discharge from hospital  Patient/family agrees with progress made and goals achieved: Yes  PT Discharge Precautions/Restrictions Precautions Precautions: Fall Restrictions Weight Bearing Restrictions:  No Pain Pain Assessment Faces Pain Scale: No hurt  Cognition Overall Cognitive Status: Impaired/Different from baseline Arousal/Alertness: Awake/alert Orientation Level: Oriented to person Focused Attention: Appears intact Sustained Attention: Impaired Sustained Attention Impairment: Verbal basic;Functional basic Memory: Impaired Memory Impairment: Storage deficit Awareness: Impaired Awareness Impairment: Intellectual impairment Problem Solving: Impaired Behaviors: Impulsive Safety/Judgment: Impaired Comments: significant visual deficits Sensation Sensation Light Touch Impaired Details: Impaired LUE;Impaired LLE Proprioception Impaired Details: Impaired LUE;Impaired LLE Coordination Gross Motor Movements are Fluid and Coordinated: No Fine Motor Movements are Fluid and Coordinated: No Coordination and Movement Description: Lt hemiplegia Motor  Motor Motor: Hemiplegia;Abnormal tone;Abnormal postural alignment and control Motor - Discharge Observations: Lt hemiplegia, coordination deficits  Mobility Bed Mobility Supine to Sit: Independent Transfers Sit to Stand: Minimal Assistance - Patient > 75% Stand Pivot Transfers: Minimal Assistance - Patient > 75% Transfer (Assistive device): None Locomotion  Gait Gait Assistance: Minimal Assistance - Patient > 75% Gait Distance (Feet): 150 Feet Assistive device: 1 person hand held assist Stairs / Additional Locomotion Stairs: Yes Stairs Assistance: Minimal Assistance - Patient > 75% Stair Management Technique: Two rails Number of Stairs: 8 Ramp: Minimal Assistance - Patient >75% Curb: Minimal Assistance - Patient >75% Wheelchair Mobility Wheelchair Mobility: No  Trunk/Postural Assessment  Cervical Assessment Cervical Assessment: (Lt rotation due to visual deficits) Thoracic Assessment Thoracic Assessment: Within Functional Limits Lumbar Assessment Lumbar Assessment: Within Functional Limits Postural Control Righting  Reactions: delayed  Balance Static Sitting Balance Static Sitting - Level of Assistance: 7: Independent Dynamic Sitting Balance Dynamic Sitting - Level of Assistance: 5: Stand by assistance Static Standing Balance Static Standing - Level of Assistance: 4: Min assist Dynamic Standing Balance Dynamic Standing - Level of Assistance: 4: Min assist Extremity Assessment  RLE Assessment RLE Assessment: Within Functional Limits LLE Assessment General Strength Comments: grossly 3/5    Madlyn Crosby 09/01/2018, 10:35 AM

## 2018-09-01 NOTE — Discharge Summary (Signed)
Discharge summary job [phone]

## 2018-09-02 MED ORDER — BICTEGRAVIR-EMTRICITAB-TENOFOV 50-200-25 MG PO TABS: 1.0000 | ORAL_TABLET | Freq: Every day | ORAL | 11 refills | Status: DC

## 2018-09-02 MED ORDER — NONFORMULARY OR COMPOUNDED ITEM: 4.0000 | Freq: Every morning | 1 refills | Status: DC

## 2018-09-02 MED ORDER — NONFORMULARY OR COMPOUNDED ITEM: 1.0000 | Freq: Every day | 1 refills | Status: DC

## 2018-09-02 MED ORDER — NONFORMULARY OR COMPOUNDED ITEM: 1.0000 | Freq: Every morning | 11 refills | Status: DC

## 2018-09-02 MED ORDER — DAPSONE 25 MG PO TABS: 50.0000 mg | ORAL_TABLET | Freq: Every day | ORAL | 1 refills | Status: DC

## 2018-09-02 MED ORDER — CLINDAMYCIN HCL 300 MG PO CAPS: 600.0000 mg | ORAL_CAPSULE | Freq: Four times a day (QID) | ORAL | 11 refills | Status: DC

## 2018-09-02 NOTE — Plan of Care (Signed)
  Problem: Consults Goal: RH BRAIN INJURY PATIENT EDUCATION Description Description: See Patient Education module for eduction specifics Outcome: Progressing Goal: Skin Care Protocol Initiated - if Braden Score 18 or less Description If consults are not indicated, leave blank or document N/A Outcome: Progressing Goal: Nutrition Consult-if indicated Outcome: Progressing   Problem: RH BOWEL ELIMINATION Goal: RH STG MANAGE BOWEL WITH ASSISTANCE Description STG Manage Bowel with min Assistance.  Outcome: Progressing Goal: RH STG MANAGE BOWEL W/MEDICATION W/ASSISTANCE Description STG Manage Bowel with Medication with min Assistance.  Outcome: Progressing   Problem: RH BLADDER ELIMINATION Goal: RH STG MANAGE BLADDER WITH ASSISTANCE Description STG Manage Bladder With mod Assistance  Outcome: Progressing Goal: RH STG MANAGE BLADDER WITH MEDICATION WITH ASSISTANCE Description STG Manage Bladder With Medication With mod Assistance.  Outcome: Progressing   Problem: RH SKIN INTEGRITY Goal: RH STG SKIN FREE OF INFECTION/BREAKDOWN Description Patients skin will remain free from further infection or breakdown with min assist.  Outcome: Progressing Goal: RH STG MAINTAIN SKIN INTEGRITY WITH ASSISTANCE Description STG Maintain Skin Integrity With min Assistance.  Outcome: Progressing   Problem: RH SAFETY Goal: RH STG ADHERE TO SAFETY PRECAUTIONS W/ASSISTANCE/DEVICE Description STG Adhere to Safety Precautions With Mod/max Assistance/Device.  Outcome: Progressing   Problem: RH COGNITION-NURSING Goal: RH STG USES MEMORY AIDS/STRATEGIES W/ASSIST TO PROBLEM SOLVE Description STG Uses Memory Aids/Strategies With mod Assistance to Problem Solve.  Outcome: Progressing Goal: RH STG ANTICIPATES NEEDS/CALLS FOR ASSIST W/ASSIST/CUES Description STG Anticipates Needs/Calls for Assist With mod Assistance/Cues.  Outcome: Progressing   Problem: RH KNOWLEDGE DEFICIT BRAIN INJURY Goal: RH  STG INCREASE KNOWLEDGE OF SELF CARE AFTER BRAIN INJURY Description Mod assist  Outcome: Progressing

## 2018-09-02 NOTE — Progress Notes (Addendum)
[place] PHYSICAL MEDICINE & REHABILITATION PROGRESS NOTE   Subjective/Complaints: Patient seen laying in bed this morning.  Family at bedside.  Interaction limited due to language.  No reported issues.  ROS: Limited due to language.  Objective:   No results found. Recent Labs    09/01/18 1036  WBC 4.9  HGB 13.7  HCT 41.0  PLT 333   Recent Labs    09/01/18 1036  NA 135  K 3.6  CL 100  CO2 24  GLUCOSE 103*  BUN 11  CREATININE 0.88  CALCIUM 9.0    Intake/Output Summary (Last 24 hours) at 09/02/2018 8563 Last data filed at 09/01/2018 2230 Gross per 24 hour  Intake 360 ml  Output -  Net 360 ml     Physical Exam: Vital Signs Blood pressure (!) 139/94, pulse 97, temperature (!) 97.5 F (36.4 C), temperature source Oral, resp. rate 17, height 5\' 8"  (1.727 m), weight 72.1 kg, SpO2 98 %. Constitutional: No distress . Vital signs reviewed. HENT: Normocephalic.  Atraumatic. Eyes: Disconjugate gaze. No discharge. Cardiovascular: RRR. No JVD. Respiratory: CTA Bilaterally. Normal effort. GI: BS +. Non-distended. Musc: No edema or tenderness in extremities. Neurological: Alert Limited due to language Manual muscle testing at least 4/5 bilateral deltoid bicep tricep hip flexor knee extensor ankle dorsiflexor Skin: rash resolved Psych: Distracted  Assessment/Plan: 1. 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  Physiatrist is providing close team supervision and 24 hour management of active medical problems listed below.  Physiatrist and rehab team continue to assess barriers to discharge/monitor patient progress toward functional and medical goals  Care Tool:  Bathing    Body parts bathed by patient: Right arm, Chest, Left arm, Front perineal area, Abdomen, Buttocks, Right upper leg, Left upper leg, Right lower leg, Left lower leg, Face   Body  parts bathed by helper: Buttocks     Bathing assist Assist Level: Supervision/Verbal cueing     Upper Body Dressing/Undressing Upper body dressing   What is the patient wearing?: Pull over shirt    Upper body assist Assist Level: Set up assist    Lower Body Dressing/Undressing Lower body dressing      What is the patient wearing?: Pants     Lower body assist Assist for lower body dressing: Supervision/Verbal cueing     Toileting Toileting Toileting Activity did not occur (Clothing management and hygiene only): N/A (no void or bm)(Admitted late and did not occur on shift)  Toileting assist Assist for toileting: Minimal Assistance - Patient > 75%     Transfers Chair/bed transfer  Transfers assist     Chair/bed transfer assist level: Contact Guard/Touching assist     Locomotion Ambulation   Ambulation assist      Assist level: Minimal Assistance - Patient > 75% Assistive device: Hand held assist Max distance: 150   Walk 10 feet activity   Assist     Assist level: Minimal Assistance - Patient > 75% Assistive device: Hand held assist   Walk 50 feet activity   Assist Walk 50 feet with 2 turns activity did not occur: Safety/medical concerns  Assist level: Minimal Assistance - Patient > 75% Assistive device: Hand held assist    Walk 150 feet activity   Assist Walk 150 feet activity did not occur: Safety/medical concerns  Assist level: Minimal Assistance - Patient > 75%      Walk 10 feet on uneven surface  activity   Assist Walk 10 feet on uneven surfaces activity did not occur: Safety/medical concerns   Assist level: Minimal Assistance - Patient > 75% Assistive device: Hand held assist   Wheelchair     Assist Will patient use wheelchair at discharge?: No             Wheelchair 50 feet with 2 turns activity    Assist            Wheelchair 150 feet activity     Assist          Medical Problem List and  Plan: 1.Decreased functional mobility with cognitive deficitssecondary to acute encephalopathy related toxoplasmosis in the setting of immunosuppression due to recently diagnosed HIV.   D/c today  -Patient to see Rehab MD/provider in the office for transitional care encounter in 1-2 weeks.   -Follow-up per infectious disease.   -Currently completing a course of clindamycin and pyrimethamine, appreciate ID recs 2. DVT Prophylaxis/Anticoagulation: SCDs 3. Pain Management:Tylenol as needed 4. Mood:Provide emotional support 5. Neuropsych: This patientis notcapable of making decisions on hisown behalf. 6. Skin/Wound Care:Resolved  -urecholine stopped  -benadryl 25mg  q6 prn  -sulfa held   -sarna prn   7. Fluids/Electrolytes/Nutrition:  Encourage PO  -supp  For hypalbuminemia 8.HIV. plan per ID, family appears unaware of this diagnosis 9. Urinary retention. Improved emptying and continence   10. History of polysubstance abuse. Urine drug screen negative 12.  Injected sclera  Naphcon helped  LOS: 15 days A FACE TO FACE EVALUATION WAS PERFORMED  Ankit Lorie Phenix 09/02/2018, 9:28 AM

## 2018-09-02 NOTE — Progress Notes (Signed)
Pt discharged home accompanied by family. Belongings sent home with pt. PRN tylenol and scheduled med given prior to discharge. Pt stable. No further question from pt or family.  Majorie Santee W Chimene Salo

## 2018-09-02 NOTE — Progress Notes (Signed)
Subjective: No new complaints Antibiotics:  Anti-infectives (From admission, onward)   Start     Dose/Rate Route Frequency Ordered Stop   09/02/18 0000  bictegravir-emtricitabine-tenofovir AF (BIKTARVY) 50-200-25 MG TABS tablet    Note to Pharmacy:  ID: [id number] BIN: [id number] RxGroup: [id number] PCN: [id number]   1 tablet Oral Daily 09/02/18 0608     09/02/18 0000  clindamycin (CLEOCIN) 300 MG capsule     600 mg Oral Every 6 hours 09/02/18 0608 08/28/19 2359   09/02/18 0000  dapsone 25 MG tablet     50 mg Oral Daily 09/02/18 0608     08/30/18 1000  dapsone tablet 50 mg     50 mg Oral Daily 08/30/18 0952     08/30/18 0000  bictegravir-emtricitabine-tenofovir AF (BIKTARVY) 50-200-25 MG TABS tablet     1 tablet Oral Daily 08/29/18 1438     08/30/18 0000  dapsone 100 MG tablet  Status:  Discontinued     100 mg Oral Daily 08/30/18 1010 08/30/18    08/29/18 0000  bictegravir-emtricitabine-tenofovir AF (BIKTARVY) 50-200-25 MG TABS tablet  Status:  Discontinued    Note to Pharmacy:  ID: [id number] BIN: [id number] RxGroup: [id number] PCN: [id number]   1 tablet Oral Daily 08/29/18 1438 08/30/18    08/29/18 0000  clindamycin (CLEOCIN) 300 MG capsule  Status:  Discontinued     600 mg Oral Every 6 hours 08/29/18 1438 08/30/18    08/25/18 1300  bictegravir-emtricitabine-tenofovir AF (BIKTARVY) 50-200-25 MG per tablet 1 tablet     1 tablet Oral Daily 08/25/18 1050     08/24/18 1200  clindamycin (CLEOCIN) capsule 600 mg     600 mg Oral Every 6 hours 08/24/18 0930     08/18/18 1930  sulfaDIAZINE tablet 1,500 mg  Status:  Discontinued     1,500 mg Oral Every 6 hours 08/18/18 1922 08/24/18 0930      Medications: Scheduled Meds: . bictegravir-emtricitabine-tenofovir AF  1 tablet Oral Daily  . clindamycin  600 mg Oral Q6H  . dapsone  50 mg Oral Daily  . feeding supplement  1 Container Oral TID BM  . multivitamins with iron  1 tablet Oral Daily  . Pyrimethamine 50mg / Leucovirin 20mg   1 capsule  Oral Daily   And  . Pyrimethamine 25mg  / Leucovorin 5mg    1 capsule Oral Daily  . thiamine  100 mg Oral Daily   Continuous Infusions:  PRN Meds:.acetaminophen **OR** acetaminophen, camphor-menthol, diphenhydrAMINE, hydrocerin, naphazoline-glycerin, ondansetron **OR** ondansetron (ZOFRAN) IV, polyvinyl alcohol, sorbitol    Objective: Weight change:   Intake/Output Summary (Last 24 hours) at 09/02/2018 0950 Last data filed at 09/01/2018 2230 Gross per 24 hour  Intake 360 ml  Output -  Net 360 ml   Blood pressure (!) 139/94, pulse 97, temperature (!) 97.5 F (36.4 C), temperature source Oral, resp. rate 17, height 5\' 8"  (1.727 m), weight 72.1 kg, SpO2 98 %. Temp:  [97.5 F (36.4 C)-98.7 F (37.1 C)] 97.5 F (36.4 C) (12/20 0618) Pulse Rate:  [97-104] 97 (12/20 0618) Resp:  [16-18] 17 (12/20 0618) BP: (124-147)/(82-94) 139/94 (12/20 0618) SpO2:  [98 %-99 %] 98 % (12/20 0618)  Physical Exam: General: Confused but able to answer some questions.  knows he is the [place] HEENT: anicteric sclera, he is constantly gazing left  towards midline but with staccato-like movements CVS regular rate, normal  Chest: , no wheezing, no respiratory distress Abdomen: soft non-distended,  Extremities: no edema or deformity noted bilaterally  Neuro no new focal deficits He knew he was in [place] today and recognizes sister sitting next to him.    CBC:    BMET Recent Labs    09/01/18 1036  NA 135  K 3.6  CL 100  CO2 24  GLUCOSE 103*  BUN 11  CREATININE 0.88  CALCIUM 9.0     Liver Panel  Recent Labs    09/01/18 1036  PROT 8.4*  ALBUMIN 3.7  AST 44*  ALT 47*  ALKPHOS 74  BILITOT 1.0       Sedimentation Rate No results for input(s): ESRSEDRATE in the last 72 hours. C-Reactive Protein No results for input(s): CRP in the last 72 hours.  Micro Results: Recent Results (from the past 720 hour(s))  Acid Fast Smear (AFB)     Status: None   Collection Time: 08/11/18 11:20 AM    Result Value Ref Range Status   AFB Specimen Processing Concentration  Final   Acid Fast Smear Negative  Final    Comment: (NOTE) Performed At: [facility] [place], [place] [id number] Rush Farmer MD [id number]    Source (AFB) CSF  Final    Comment: Performed at [facility], [place] [address], [place], [place] [address]  Anaerobic culture     Status: None   Collection Time: 08/11/18 11:20 AM  Result Value Ref Range Status   Specimen Description CSF  Final   Special Requests NONE  Final   Culture   Final    NO ANAEROBES ISOLATED Performed at [facility], [place] [address] [place], [place] [address]    Report Status 08/15/2018 FINAL  Final  CSF culture     Status: None   Collection Time: 08/11/18 11:20 AM  Result Value Ref Range Status   Specimen Description CSF  Final   Special Requests NONE  Final   Gram Stain NO WBC SEEN NO ORGANISMS SEEN   Final   Culture   Final    NO GROWTH 3 DAYS Performed at [facility], [place] [address], [place], [place] [address]    Report Status 08/14/2018 FINAL  Final  Fungus Culture With Stain     Status: None (Preliminary result)   Collection Time: 08/11/18 11:20 AM  Result Value Ref Range Status   Fungus Stain Final report  Final    Comment: (NOTE) Performed At: [facility] [place], [place] [id number] Rush Farmer MD [id number]    Fungus (Mycology) Culture PENDING  Incomplete   Fungal Source CSF  Final    Comment: Performed at [facility], [place] [address], [place], [place] [address]  Andee Poles, PCR     Status: Abnormal   Collection Time: 08/11/18 11:20 AM  Result Value Ref Range Status   Toxoplasma Gondii, PCR Positive (A) Negative Final    Comment: (NOTE) Toxoplasma gondii DNA Detected. This test was developed and its performance characteristics determined by [employer]. It has not been cleared or approved by the U.S. Food and Drug  Administration. The FDA has determined that such clearance or approval is not necessary. This test is used for clinical purposes. It should not be regarded as investigational or research. Performed At: [facility] [place], [place] [id number] Rush Farmer MD [id number]   Fungus Culture Result     Status: None   Collection Time: 08/11/18 11:20 AM  Result Value Ref Range Status   Result 1 Comment  Final  Comment: (NOTE) KOH/Calcofluor preparation:  no fungus observed. Performed At: [facility] [address] [place], [place] [id number] Rush Farmer MD [id number]   Culture, blood (Routine X 2) w Reflex to ID Panel     Status: None   Collection Time: 08/12/18 11:12 AM  Result Value Ref Range Status   Specimen Description BLOOD RIGHT ANTECUBITAL  Final   Special Requests   Final    BOTTLES DRAWN AEROBIC AND ANAEROBIC Blood Culture adequate volume   Culture   Final    NO GROWTH 5 DAYS Performed at [facility], [address], [place], [place] [address]    Report Status 08/17/2018 FINAL  Final  Culture, blood (Routine X 2) w Reflex to ID Panel     Status: None   Collection Time: 08/12/18 11:19 AM  Result Value Ref Range Status   Specimen Description BLOOD LEFT ANTECUBITAL  Final   Special Requests   Final    BOTTLES DRAWN AEROBIC AND ANAEROBIC Blood Culture adequate volume   Culture   Final    NO GROWTH 5 DAYS Performed at [facility], [place] [address], [place], [place] [address]    Report Status 08/17/2018 FINAL  Final    Studies/Results: No results found.    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 male with diagnosed HIV and AIDS resents with confusion headaches and multiple CNS lesions concerning for possible  toxoplasmosis versus other opportunistic infections.  He also has peripheral vision loss  #1 Toxoplasmosis of CNS with Encephalitis:  Continue clindamycin and continued pyrimethamine  We will provide him with 30 day supply of these meds but he will need to be treated for a year I believe  Monitor for immune reconstitution syndrome  2. HIV and AIDS: Initiated Biktarvy we will make sure he has a 30-day supplies of this as well.  Hopefully also his HMA P will be processed emergently.  He will need to fill meds in future from the [facility] [address]  dapsone for PCP prevention  I have contacted Barbette Hair from our clinic and he is going to call the sister today to be a good bridge of contact for them and our clinic with questions.  While he is not a provider he is very student at helping our patients who are Spanish-speaking in particular navigate our healthcare system and make sure that they know how to get to our clinic and take their medications.   #3 drug rash: Seems most likely of been due to the sulfadiazine  GREATLY APPRECIATE ID PHARMACY GOING OVER MEDS W PTS SISTER WITH PILLBOX, greatly appreciate Audelia Hives calling the patient today  Patient has appointment with me on September 21, 2018 at 9:15 in the morning  I spent greater than 35 minutes with the patient including greater than 50% of time in face to face counsel of the patient and his sister and father using telephonic translation in [place] regarding his diagnosis medications it would be filled and how he would come to our clinic and needs for renewal of HMA P biannually including in January and July and in coordination of his care.       LOS: 15 days   Alcide Evener 09/02/2018, 9:50 AM

## 2018-09-02 NOTE — Progress Notes (Signed)
Social Work  Discharge Note  The overall goal for the admission was met for:   Discharge location: Yes - home with sister.  2 sisters and father of pt are providing 24/7 supervision/ min assist  Length of Stay: Yes - 15 days  Discharge activity level: Yes - minimal assistance overall  Home/community participation: Yes  Services provided included: MD, RD, PT, OT, SLP, RN, TR, Pharmacy and SW and interpreter.  Financial Services: None - uninsured  Follow-up services arranged: Home Health: RN, PT, OT, ST via [place], DME: 18x18 lightweight w/c, cushion, 3n1 commode and tub bench via [facility], Other: enrolled pt in [facility] program and referred to Primary Care at [facility] and Patient/Family has no preference for HH/DME agencies  Comments (or additional information):  Patient/Family verbalized understanding of follow-up arrangements: Yes  Individual responsible for coordination of the follow-up plan: pt/ sister  Confirmed correct DME delivered: Lennart Pall 09/02/2018    Anisten Tomassi

## 2018-09-05 ENCOUNTER — Encounter: Payer: Self-pay | Admitting: Physical Medicine & Rehabilitation

## 2018-09-09 ENCOUNTER — Telehealth: Payer: Self-pay

## 2018-09-09 NOTE — Telephone Encounter (Signed)
Patrick Jupiter, RN notified.

## 2018-09-09 NOTE — Telephone Encounter (Signed)
lamisil is fine. thx

## 2018-09-09 NOTE — Telephone Encounter (Signed)
Donita, RN from [facility] called to get verbal orders 1wk4, orders given and approved per discharge summary. Also pt has fungal infection on foot and wanted to know what to put on it? Dr. Posey Pronto recommended Lamisil but I'm sending message to if any other is recommended.

## 2018-09-13 ENCOUNTER — Telehealth: Payer: Self-pay

## 2018-09-13 NOTE — Telephone Encounter (Signed)
Ritu, OT from [facility] called requesting verbal orders for HHOT 1wk1, 2wk2. Orders approved and given per discharge summary.

## 2018-09-13 NOTE — Telephone Encounter (Signed)
Cecilie Lowers, PT from [facility] called requesting verbal orders for HHPT 2wk2, 1wk1 and also requesting SW to get help with community resources. HHPT orders approved and given per discharge summary.  Is SW okay?

## 2018-09-14 NOTE — Telephone Encounter (Signed)
Yes, thx

## 2018-09-15 LAB — FUNGUS CULTURE WITH STAIN

## 2018-09-15 LAB — FUNGUS CULTURE RESULT

## 2018-09-15 LAB — FUNGAL ORGANISM REFLEX

## 2018-09-15 NOTE — Telephone Encounter (Signed)
Verbal order given for SW

## 2018-09-16 ENCOUNTER — Encounter: Payer: Self-pay | Admitting: Family Medicine

## 2018-09-19 ENCOUNTER — Telehealth: Payer: Self-pay | Admitting: *Deleted

## 2018-09-19 DIAGNOSIS — B20 Human immunodeficiency virus [HIV] disease: Secondary | ICD-10-CM

## 2018-09-19 DIAGNOSIS — B5800 Toxoplasma oculopathy, unspecified: Secondary | ICD-10-CM

## 2018-09-19 DIAGNOSIS — R634 Abnormal weight loss: Secondary | ICD-10-CM

## 2018-09-19 DIAGNOSIS — H547 Unspecified visual loss: Secondary | ICD-10-CM

## 2018-09-19 DIAGNOSIS — H518 Other specified disorders of binocular movement: Secondary | ICD-10-CM

## 2018-09-19 DIAGNOSIS — Z8659 Personal history of other mental and behavioral disorders: Secondary | ICD-10-CM

## 2018-09-19 DIAGNOSIS — R269 Unspecified abnormalities of gait and mobility: Secondary | ICD-10-CM

## 2018-09-19 DIAGNOSIS — G0490 Encephalitis and encephalomyelitis, unspecified: Secondary | ICD-10-CM

## 2018-09-19 DIAGNOSIS — L989 Disorder of the skin and subcutaneous tissue, unspecified: Secondary | ICD-10-CM

## 2018-09-19 DIAGNOSIS — B582 Toxoplasma meningoencephalitis: Secondary | ICD-10-CM

## 2018-09-19 NOTE — Telephone Encounter (Signed)
[facility] called and requested 1wk1 2 wk2 POC.[name] Kitchen  Approval given.

## 2018-09-19 NOTE — Telephone Encounter (Signed)
[name] dalled back and they are ready to d/c and transition to outpt therapy.  She is asking that we make the referral and let her know when the appt will be so they can discharge appropriately.  Orders placed. He is [facility] so I am not sure about the coverage. HE has follow up appt with Dr Naaman Plummer on 09/28/18

## 2018-09-20 ENCOUNTER — Ambulatory Visit (INDEPENDENT_AMBULATORY_CARE_PROVIDER_SITE_OTHER): Payer: Self-pay | Admitting: Family Medicine

## 2018-09-20 ENCOUNTER — Encounter: Payer: Self-pay | Admitting: Family Medicine

## 2018-09-20 VITALS — BP 130/95 | HR 106 | Temp 98.9°F | Resp 18 | Ht 65.0 in | Wt 163.0 lb

## 2018-09-20 DIAGNOSIS — M25531 Pain in right wrist: Secondary | ICD-10-CM

## 2018-09-20 DIAGNOSIS — B582 Toxoplasma meningoencephalitis: Secondary | ICD-10-CM

## 2018-09-20 DIAGNOSIS — M25539 Pain in unspecified wrist: Secondary | ICD-10-CM

## 2018-09-20 DIAGNOSIS — Z21 Asymptomatic human immunodeficiency virus [HIV] infection status: Secondary | ICD-10-CM

## 2018-09-20 DIAGNOSIS — R269 Unspecified abnormalities of gait and mobility: Secondary | ICD-10-CM

## 2018-09-20 DIAGNOSIS — R4189 Other symptoms and signs involving cognitive functions and awareness: Secondary | ICD-10-CM

## 2018-09-20 DIAGNOSIS — B20 Human immunodeficiency virus [HIV] disease: Secondary | ICD-10-CM

## 2018-09-20 DIAGNOSIS — Z7689 Persons encountering health services in other specified circumstances: Secondary | ICD-10-CM

## 2018-09-20 MED ORDER — GABAPENTIN 100 MG PO CAPS: 100.0000 mg | ORAL_CAPSULE | Freq: Two times a day (BID) | ORAL | 1 refills | Status: DC | PRN

## 2018-09-20 NOTE — Progress Notes (Signed)
[place] [facility], is a 28 y.o. male  BSJ:[id number]  TML:[id number]  DOB - 27-Apr-1991  CC:  Chief Complaint  Patient presents with  . Establish Care  . Hospitalization Follow-up    ED->Hosp 11/27-12/5: new diagnosed HIV, encephalitis, numerous brain lesions. inpatient rehab 12/5-12/20: CNS toxoplasmosis       HPI: [place] [facility] is a 28 y.o. male is here today to establish care.   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    Patient is accompanied today by family who is providing most of the history, father and brother.  Language interpreter also present to help facilitate visit.  Information also collected from previous HPI from recent hospitalizations as family has minimal knowledge to add to HPI.  Patient presented to the ER accompanied by his sister on 08/10/2018 with altered mental status, 3 months of unintentional weight loss, confusion. Was newly diagnosed with HIV and has not been started on any medication prior to presenting to the ER.  MRI confirmed multiple lesions of the left sided basal ganglia consistent with toxoplasmosis.  Patient was started on empiric antibiotic therapy under the rotation of ID.  CD4 count was less than 10 patient had ocular involvement with complete loss of peripheral vision and deviation of both eyes towards the left lateral side.  Ocular involvement was thought to be related to the presence of intracranial lesions.  Patient was discharged from the hospital on 08/18/2018 and admitted to [facility] inpatient rehab on 08/18/2018. While in rehab patient continued IV antibiotics, received occupational, speech, and rehabilitation nursing.  Well in inpatient therapy patient did suffer a period of urinary retention which resolved prior to discharge.  Family reports that discharge patient was started on  antiviral therapy and was able to start that particular medication on the day of discharge he is currently being managed for HIV/AIDS with Biktarvy.  [facility] reports since starting antiviral treatment and not returning home patient has been able to participate more in his self-care.  He is continent of both urine and stool.  Continues to have some speech deficits however is able to follow basic commands.  He is at home with family at all times and is never left alone.  He is scheduled to follow-up with infectious disease tomorrow.  He is uninsured family is concerned regarding the cost of medication and cost of care.  Family has been working with Education officer, museum from inpatient admission however has not been linked to any other resources.  Patient continues to have severe erythema of bilateral eyes however has not seen an ophthalmology outpatient given current uninsured status.  Second complaint, patient is complaining of right wrist pain.  Family has noticed increased swelling of the lateral right wrist.  Family is unaware of patient suffered a fall.  He complains that pain is shooting down into his hand.  No known history of carpal tunnel.  Patient has not taken anything other than Tylenol which has not helped with pain.  Current medications: Current Outpatient Medications:  .  acetaminophen (TYLENOL) 325 MG tablet, Take 2 tablets (650 mg total) by mouth every 6 (six) hours as needed for mild pain (or Fever >/= 101)., Disp: , Rfl:  .  bictegravir-emtricitabine-tenofovir AF (BIKTARVY) 50-200-25 MG TABS tablet, Take 1 tablet by mouth daily., Disp: 30 tablet, Rfl: 11 .  clindamycin (CLEOCIN) 300 MG  capsule, Take 2 capsules (600 mg total) by mouth every 6 (six) hours., Disp: 240 capsule, Rfl: 11 .  dapsone 25 MG tablet, Take 2 tablets (50 mg total) by mouth daily., Disp: 30 tablet, Rfl: 1 .  Multiple Vitamins-Iron (MULTIVITAMINS WITH IRON) TABS tablet, Take 1 tablet by mouth daily., Disp: , Rfl: 0 .   NONFORMULARY OR COMPOUNDED ITEM, Take 1 capsule by mouth every morning., Disp: 30 each, Rfl: 11 .  NONFORMULARY OR COMPOUNDED ITEM, Take 1 capsule by mouth every morning., Disp: 30 each, Rfl: 11   Pertinent family medical history: family history is not on file.   No Known Allergies  Social History   Socioeconomic History  . Marital status: Single    Spouse name: Not on file  . Number of children: Not on file  . Years of education: Not on file  . Highest education level: Not on file  Occupational History  . Not on file  Social Needs  . Financial resource strain: Not on file  . Food insecurity:    Worry: Not on file    Inability: Not on file  . Transportation needs:    Medical: Not on file    Non-medical: Not on file  Tobacco Use  . Smoking status: Never Smoker  . Smokeless tobacco: Never Used  Substance and Sexual Activity  . Alcohol use: Yes    Alcohol/week: 6.0 standard drinks    Types: 6 Cans of beer per week    Comment: 08/10/2018 "drink only on weekend"  . Drug use: Not Currently    Comment: 08/10/2018 "have tried coke; been a long time since I've usd any"  . Sexual activity: Not Currently  Lifestyle  . Physical activity:    Days per week: Not on file    Minutes per session: Not on file  . Stress: Not on file  Relationships  . Social connections:    Talks on phone: Not on file    Gets together: Not on file    Attends religious service: Not on file    Active member of club or organization: Not on file    Attends meetings of clubs or organizations: Not on file    Relationship status: Not on file  . Intimate partner violence:    Fear of current or ex partner: Not on file    Emotionally abused: Not on file    Physically abused: Not on file    Forced sexual activity: Not on file  Other Topics Concern  . Not on file  Social History Narrative  . Not on file    Review of Systems: See HPI   Objective:   Vitals:   09/20/18 1421  BP: (!) 130/95  Pulse:  (!) 106  Resp: 18  Temp: 98.9 F (37.2 C)  SpO2: 95%    BP Readings from Last 3 Encounters:  09/20/18 (!) 130/95  09/02/18 (!) 139/94  08/18/18 123/75    Filed Weights   09/20/18 1421  Weight: 163 lb (73.9 kg)     Physical Exam Constitutional:      General: He is not in acute distress.    Appearance: He is ill-appearing. He is not toxic-appearing or diaphoretic.  HENT:     Head: Normocephalic.     Mouth/Throat:     Tongue: Lesions present.     Pharynx: Uvula midline. No posterior oropharyngeal erythema.     Comments: Tongue has a purplish hue and is discolored.  Family reports since starting medication they  have noticed a discoloration of patient's tongue. Eyes:     General: Visual field deficit present.     Conjunctiva/sclera:     Right eye: Hemorrhage present.     Left eye: Hemorrhage present.     Comments: Extraocular movements impaired however this could be secondary to language barrier and or current neurological state  Cardiovascular:     Rate and Rhythm: Normal rate and regular rhythm.  Pulmonary:     Effort: Pulmonary effort is normal.     Breath sounds: Normal breath sounds.  Lymphadenopathy:     Cervical: Cervical adenopathy present.  Skin:    Comments: Multiple generalized macular type lesions bilateral upper extremities non-excoriated  Neurological:     Mental Status: He is alert.     Cranial Nerves: Cranial nerve deficit present. No facial asymmetry.     Gait: Gait abnormal.  Psychiatric:        Behavior: Behavior normal.        Thought Content: Thought content normal.     Comments: Patient was able to follow basic commands and had an appropriate affect during visit.   Lab Results (prior encounters)  Lab Results  Component Value Date   WBC 4.9 09/01/2018   HGB 13.7 09/01/2018   HCT 41.0 09/01/2018   MCV 83.8 09/01/2018   PLT 333 09/01/2018   Lab Results  Component Value Date   CREATININE 0.88 09/01/2018   BUN 11 09/01/2018   NA 135  09/01/2018   K 3.6 09/01/2018   CL 100 09/01/2018   CO2 24 09/01/2018    No results found for: HGBA1C  No results found for: CHOL, TRIG, HDL, CHOLHDL, VLDL, LDLCALC      Assessment and plan:  1. Encounter to establish care 2. 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[place]) Followed by ID, patient also given information to follow-up with an ophthalmologist specialist to follow-up on ocular impairments that have not completely resolved.  3. AIDS ([place]) Followed by ID  4. 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  Patient has worked with PT and will refer to physical therapy once patient has been approved for financial assistance and or Medicaid.  5. Impaired cognition -Secondary to toxoplasmosis will defer to ID as to whether or not patient would benefit from neurology.  6. Pain of radial side of wrist -For now will trial compression with a wrist splint For pain will trial gabapentin 100 mg twice daily. Family to follow-up if he continues to complain of pain   Return in about 6 months (around 03/21/2019) for routine wellness .   The patient was given clear instructions to go to ER or return to medical center if symptoms don't improve, worsen or new problems develop. The patient verbalized understanding. The patient was advised  to call and obtain lab results if they haven't heard anything from out office within 7-10 business days.  Molli Barrows, FNP Primary Care at [facility] [address], [place] [address] [phone]: [phone]    This note has been created with Dragon speech recognition software and Engineer, materials. Any transcriptional errors are unintentional.

## 2018-09-20 NOTE — Patient Instructions (Addendum)
  [place] por elegir Atencin Primaria en [employer] para que sea su hogar mdico!  [place] fue vista por Molli Barrows, FNP hoy.  El proveedor de atencin primaria de [place] es [place], [place], [place]. Para la mejor atencin posible, debe tratar de ver a Molli Barrows, FNP-C cada vez que venga a la clnica.  Esperamos verte pronto!  Si tiene [employer] su visita hoy, llmenos al [phone] o no dude en comunicarse con su proveedor de atencin primaria a travs de MyChart.   For wrist pain, pick-up a wrist splint and wear during the day while awake to reduce radial nerve pain. I have prescribed Gabapentin 100 mg twice daily as needed for pain. For further evaluation of visual problems and contact [place] at  [place], recoge una frula de [place] durante el da mientras ests despierto para reducir el dolor nervioso radial. He prescrito Gabapentin 100 mg [place] segn sea necesario para Conservation officer, historic buildings. Para una mayor evaluacin de los problemas visuales y pngase en contacto con [place] en  [phone]

## 2018-09-21 ENCOUNTER — Encounter: Payer: Self-pay | Admitting: Infectious Disease

## 2018-09-21 ENCOUNTER — Ambulatory Visit (INDEPENDENT_AMBULATORY_CARE_PROVIDER_SITE_OTHER): Payer: Self-pay | Admitting: Infectious Disease

## 2018-09-21 VITALS — BP 125/90 | HR 112 | Wt 161.1 lb

## 2018-09-21 DIAGNOSIS — H538 Other visual disturbances: Secondary | ICD-10-CM

## 2018-09-21 DIAGNOSIS — Z23 Encounter for immunization: Secondary | ICD-10-CM

## 2018-09-21 DIAGNOSIS — B582 Toxoplasma meningoencephalitis: Secondary | ICD-10-CM

## 2018-09-21 DIAGNOSIS — G629 Polyneuropathy, unspecified: Secondary | ICD-10-CM

## 2018-09-21 DIAGNOSIS — H5713 Ocular pain, bilateral: Secondary | ICD-10-CM

## 2018-09-21 DIAGNOSIS — K148 Other diseases of tongue: Secondary | ICD-10-CM

## 2018-09-21 DIAGNOSIS — B20 Human immunodeficiency virus [HIV] disease: Secondary | ICD-10-CM

## 2018-09-21 DIAGNOSIS — R531 Weakness: Secondary | ICD-10-CM

## 2018-09-21 DIAGNOSIS — D893 Immune reconstitution syndrome: Secondary | ICD-10-CM

## 2018-09-21 DIAGNOSIS — Q821 Xeroderma pigmentosum: Secondary | ICD-10-CM

## 2018-09-21 DIAGNOSIS — I75021 Atheroembolism of right lower extremity: Secondary | ICD-10-CM | POA: Insufficient documentation

## 2018-09-21 HISTORY — DX: Polyneuropathy, unspecified: G62.9

## 2018-09-21 HISTORY — DX: Immune reconstitution syndrome: D89.3

## 2018-09-21 HISTORY — DX: Other diseases of tongue: K14.8

## 2018-09-21 HISTORY — DX: Atheroembolism of right lower extremity: I75.021

## 2018-09-21 HISTORY — DX: Weakness: R53.1

## 2018-09-21 HISTORY — DX: Xeroderma pigmentosum: Q82.1

## 2018-09-21 MED ORDER — BICTEGRAVIR-EMTRICITAB-TENOFOV 50-200-25 MG PO TABS: 1.0000 | ORAL_TABLET | Freq: Every day | ORAL | 11 refills | Status: DC

## 2018-09-21 MED ORDER — PYRIMETHAMINE 25 MG PO TABS: 75.0000 mg | ORAL_TABLET | Freq: Every day | ORAL | 4 refills | Status: DC

## 2018-09-21 MED ORDER — DAPSONE 25 MG PO TABS: 50.0000 mg | ORAL_TABLET | Freq: Every day | ORAL | 11 refills | Status: DC

## 2018-09-21 MED ORDER — CLINDAMYCIN HCL 300 MG PO CAPS: 600.0000 mg | ORAL_CAPSULE | Freq: Four times a day (QID) | ORAL | 5 refills | Status: DC

## 2018-09-21 MED ORDER — GABAPENTIN 100 MG PO CAPS: 100.0000 mg | ORAL_CAPSULE | Freq: Two times a day (BID) | ORAL | 1 refills | Status: DC | PRN

## 2018-09-21 MED ORDER — LEUCOVORIN CALCIUM 25 MG PO TABS: 25.0000 mg | ORAL_TABLET | Freq: Every day | ORAL | 5 refills | Status: DC

## 2018-09-21 NOTE — Progress Notes (Signed)
Subjective:   Chief complaint: Eye pain and redness in the eyes   Patient ID: [place], male    DOB: 11-27-1990, 28 y.o.   MRN: [id number]  HPI  This is a 28 year old [name] with recently diagnosed HIV AIDS and CNS toxoplasmosis and toxoplasma encephalitis.  He was hospitalized on November 27 and initially treated with IV Bactrim.  His lumbar puncture revealed toxoplasma by PCR.  He developed intense pruritus on Bactrim which then also occurred when we challenge him with sulfadiazine with pyrimethamine.  Ultimately we changed him over to clindamycin with pyrimethamine and leucovorin and he did well on this regimen.  We started him on Biktarvy while he was in the hospital and also placed him on dapsone for PCP prophylaxis.  He had a protracted stay in the hospital including time in inpatient rehab.  He is currently receiving physical therapy at least twice a week via [facility] home [facility].  He comes to the clinic accompanied by his father and sister and his brother-in-law.  He is a very supportive family the been helping him with 24-hour supervision of his care.  He was approved for the HIV medication assistance program.  However this program does not cover Daraprim.  Therefore we will have to get there from via pharmaceutical assistance.  His vision seems improved and his extraocular movement is dramatically improved versus when I saw him last.  He knows he is in a clinic today but does not know the name of the city or the state where he is.  View of systems not reliably obtainable given his continued encephalopathy.  Past Medical History:  Diagnosis Date  . Anemia    [name] 08/10/2018  . HIV (human immunodeficiency virus infection) ([place])    dx'd 07/2018  . 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 NO PAST SURGERIES      No family history on file.    Social History   Socioeconomic History  . Marital  status: Single    Spouse name: Not on file  . Number of children: Not on file  . Years of education: Not on file  . Highest education level: Not on file  Occupational History  . Not on file  Social Needs  . Financial resource strain: Not on file  . Food insecurity:    Worry: Not on file    Inability: Not on file  . Transportation needs:    Medical: Not on file    Non-medical: Not on file  Tobacco Use  . Smoking status: Never Smoker  . Smokeless tobacco: Never Used  Substance and Sexual Activity  . Alcohol use: Yes    Alcohol/week: 6.0 standard drinks    Types: 6 Cans of beer per week    Comment: 08/10/2018 "drink only on weekend"  . Drug use: Not Currently    Comment: 08/10/2018 "have tried coke; been a long time since I've usd any"  . Sexual activity: Not Currently  Lifestyle  . Physical activity:    Days per week: Not on file    Minutes per session: Not on file  . Stress: Not on file  Relationships  . Social connections:    Talks on phone: Not on file    Gets together: Not on file    Attends religious service: Not on file    Active member of club or organization: Not on file    Attends meetings of  clubs or organizations: Not on file    Relationship status: Not on file  Other Topics Concern  . Not on file  Social History Narrative  . Not on file    No Known Allergies   Current Outpatient Medications:  .  acetaminophen (TYLENOL) 325 MG tablet, Take 2 tablets (650 mg total) by mouth every 6 (six) hours as needed for mild pain (or Fever >/= 101)., Disp: , Rfl:  .  bictegravir-emtricitabine-tenofovir AF (BIKTARVY) 50-200-25 MG TABS tablet, Take 1 tablet by mouth daily., Disp: 30 tablet, Rfl: 11 .  clindamycin (CLEOCIN) 300 MG capsule, Take 2 capsules (600 mg total) by mouth every 6 (six) hours., Disp: 240 capsule, Rfl: 5 .  dapsone 25 MG tablet, Take 2 tablets (50 mg total) by mouth daily., Disp: 30 tablet, Rfl: 11 .  gabapentin (NEURONTIN) 100 MG capsule, Take 1  capsule (100 mg total) by mouth 2 (two) times daily between meals as needed., Disp: 60 capsule, Rfl: 1 .  leucovorin (WELLCOVORIN) 25 MG tablet, Take 1 tablet (25 mg total) by mouth daily., Disp: 30 tablet, Rfl: 5 .  pyrimethamine (DARAPRIM) 25 MG tablet, Take 3 tablets (75 mg total) by mouth daily with breakfast., Disp: 90 tablet, Rfl: 4    Review of Systems  Unable to perform ROS: Other       Objective:   Physical Exam Constitutional:      General: He is not in acute distress.    Appearance: Normal appearance. He is well-developed. He is not ill-appearing or diaphoretic.  HENT:     Head: Normocephalic and atraumatic.     Right Ear: Hearing and external ear normal.     Left Ear: Hearing and external ear normal.     Nose: No nasal deformity or rhinorrhea.  Eyes:     General: No scleral icterus.    Conjunctiva/sclera:     Right eye: Right conjunctiva is injected.     Left eye: Left conjunctiva is injected.  Neck:     Musculoskeletal: Normal range of motion and neck supple.     Vascular: No JVD.  Cardiovascular:     Rate and Rhythm: Normal rate and regular rhythm.     Heart sounds: S1 normal and S2 normal.  Pulmonary:     Effort: Pulmonary effort is normal. No respiratory distress.  Abdominal:     Tenderness: There is no abdominal tenderness.  Musculoskeletal: Normal range of motion.     Right shoulder: Normal.     Left shoulder: Normal.     Right hip: Normal.     Left hip: Normal.     Right knee: Normal.     Left knee: Normal.  Lymphadenopathy:     Head:     Right side of head: No submandibular, preauricular or posterior auricular adenopathy.     Left side of head: No submandibular, preauricular or posterior auricular adenopathy.     Cervical:     Right cervical: No superficial or deep cervical adenopathy.    Left cervical: No superficial or deep cervical adenopathy.  Skin:    General: Skin is warm and dry.     Coloration: Skin is not pale.     Findings: No  abrasion, bruising, ecchymosis, erythema, lesion or rash.     Nails: There is no clubbing.   Neurological:     Mental Status: He is alert. He is disoriented and confused.     Cranial Nerves: Cranial nerve deficit present.     Sensory:  No sensory deficit.     Motor: Weakness present.     Coordination: Coordination abnormal.  Psychiatric:        Attention and Perception: He is attentive.        Speech: Speech normal.        Behavior: Behavior normal. Behavior is cooperative.     Ocular movement is still somewhat impaired      Assessment & Plan:   CNS toxoplasmosis: Cassie will acquire Daraprim from suitable company through patient assistance patient will get leucovorin from [facility] on [address] as well as clindamycin to continue both these drugs at current doses for an additional month.  At that point time I will down titrate his doses of therapy of toxoplasmosis treatment to secondary prophylaxis doses  HIV with AIDS: Continue Biktarvy checking labs today and continue dapsone for PCP prevention  Revision with eye pain: He was seen by Dr. Carolyn Stare in the hospital who performed a dilated funduscopic exam and found nothing wrong with his eyes.  She suspected his blurry vision and eye pain was largely due to his CNS lesions.  I suspect this is still the case but his sister wanted him to be seen by ophthalmology.  We will put a referral to ophthalmology though with a caution that there may be large bill attached to such a visit.  I spent greater than 40 minutes with the patient including greater than 50% of time in face to face counsel of the patient, sister father with regards to his CNS toxoplasmosis his HIV and AIDS plans for treatment of infection and of his HIV with lifelong antiretrovirals, need for vaccinations ways that he would obtain medications and in coordination  of his care with [facility] pharmacy.    Visit was complete the patient's sister pointed out that he had had some purplish  changes in his right foot and his left ankle that is been present since admission to the hospital.  He also new purplish lesions on the tongue as well.  These are pictured below and I took photographs of them  Right foot: September 21, 2018    Left foot January eighth 2020:    Left posterior heel January 2020:    Tongue September 21, 2018:    I suspect some of these lesions in particular the ones on the feet could be due to Kaposi's sarcoma.  I am hoping he does not have visceral Kaposi's involvement for now we will follow his clinical course and continue treatment for his CNS toxoplasmosis as for his HIV.  Certainly if there is evidence to suggest visceral Kaposi's will need referral to oncology for treatment with chemotherapy.  He also has a complaint of neuropathic numbness in the right arm which I suspect is due to his CNS toxoplasmosis and potentially with some superimposed immune reconstitution syndrome he also has weakness on that right side that is significant but it has been present since admission.

## 2018-09-21 NOTE — Progress Notes (Signed)
HPI: Thomas Park is a 28 y.o. male who presents to the [facility] clinic today to follow-up with Dr. Tommy Medal for his HIV and CNS toxoplasmosis infection.  Patient Active Problem List   Diagnosis Date Noted  . Drug rash   . Neurologic gait disorder   . Brain lesion   . Confusion   . Hyponatremia   . Substance abuse ([place])   . Cerebritis 08/10/2018  . AIDS ([place])   . 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   PYRIMETHAMINE (DARAPRIM) 25 MG TABLET    Take 3 tablets (75 mg total) by mouth daily with breakfast.  Previous Medications   ACETAMINOPHEN (TYLENOL) 325 MG TABLET    Take 2 tablets (650 mg total) by mouth every 6 (six) hours as needed for mild pain (or Fever >/= 101).  Modified Medications   Modified Medication Previous Medication   BICTEGRAVIR-EMTRICITABINE-TENOFOVIR AF (BIKTARVY) 50-200-25 MG TABS TABLET bictegravir-emtricitabine-tenofovir AF (BIKTARVY) 50-200-25 MG TABS tablet      Take 1 tablet by mouth daily.    Take 1 tablet by mouth daily.   CLINDAMYCIN (CLEOCIN) 300 MG CAPSULE clindamycin (CLEOCIN) 300 MG capsule      Take 2 capsules (600 mg total) by mouth every 6 (six) hours.    Take 2 capsules (600 mg total) by mouth every 6 (six) hours.   DAPSONE 25 MG TABLET dapsone 25 MG tablet      Take 2 tablets (50 mg total) by mouth daily.    Take 2 tablets (50 mg total) by mouth daily.   GABAPENTIN (NEURONTIN) 100 MG CAPSULE gabapentin (NEURONTIN) 100 MG capsule      Take 1 capsule (100 mg total) by mouth 2 (two) times daily between meals as needed.    Take 1 capsule (100 mg total) by mouth 2 (two) times daily between meals as needed.  Discontinued Medications   MULTIPLE VITAMINS-IRON (MULTIVITAMINS WITH IRON) TABS TABLET    Take 1 tablet by mouth daily.   NONFORMULARY OR COMPOUNDED ITEM    Take 1 capsule by mouth every morning.   NONFORMULARY OR  COMPOUNDED ITEM    Take 1 capsule by mouth every morning.    Allergies: Allergies  Allergen Reactions  . Sulfa Antibiotics Rash    Past Medical History: Past Medical History:  Diagnosis Date  . Anemia    [name] 08/10/2018  . HIV (human immunodeficiency virus infection) ([place])    dx'd 07/2018  . 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 Marital status: Single    Spouse name: Not on file  . Number of children: Not on file  . Years of education: Not on file  . Highest education level: Not on file  Occupational History  . Not on file  Social Needs  . Financial resource strain: Not on file  . Food insecurity:    Worry: Not on file    Inability: Not on file  . Transportation needs:    Medical: Not on file    Non-medical: Not on file  Tobacco Use  . Smoking status: Never Smoker  . Smokeless tobacco: Never Used  Substance and Sexual Activity  . Alcohol use: Yes    Alcohol/week: 6.0 standard drinks    Types: 6 Cans of beer per week  Comment: 08/10/2018 "drink only on weekend"  . Drug use: Not Currently    Comment: 08/10/2018 "have tried coke; been a long time since I've usd any"  . Sexual activity: Not Currently  Lifestyle  . Physical activity:    Days per week: Not on file    Minutes per session: Not on file  . Stress: Not on file  Relationships  . Social connections:    Talks on phone: Not on file    Gets together: Not on file    Attends religious service: Not on file    Active member of club or organization: Not on file    Attends meetings of clubs or organizations: Not on file    Relationship status: Not on file  Other Topics Concern  . Not on file  Social History Narrative  . Not on file    Labs: Lab Results  Component Value Date   HIV1RNAQUANT 669,000 08/25/2018   HIV1RNAQUANT 319,000 08/10/2018   CD4TABS 10 (L) 08/10/2018    RPR and STI Lab Results  Component Value Date   LABRPR  Non Reactive 08/12/2018   LABRPR Non Reactive 08/11/2018    No flowsheet data found.  Hepatitis B Lab Results  Component Value Date   HEPBSAG Negative 08/12/2018   Hepatitis C No results found for: HEPCAB, HCVRNAPCRQN Hepatitis A Lab Results  Component Value Date   HAV Positive (A) 08/12/2018   Lipids: No results found for: CHOL, TRIG, HDL, CHOLHDL, VLDL, LDLCALC  Current HIV Regimen: Biktarvy  Assessment: [name] is here today to follow-up with Dr. Tommy Medal after a prolonged hospital stay and diagnosis of HIV and CNS toxoplasmosis. He has been receiving pyrimethamine and leucovorin which was given to him before hospital discharge.  He has around 2-3 weeks left of his supply.  He does have [employer] and their formulary states that they do have pyrimethamine and leucovorin but when I called [facility], they stated that it is not on the formulary. I was able to apply to the drug company for his pyrimethamine and he will hopefully be approved to receive support from them.  Leucovorin is on the [facility] formulary (I verified with [facility]) and he will pick that up with them. Will follow this patient to make sure everything is approved.  Plan: - Apply for Daraprim patient support  Neidy Guerrieri L. Eliam Snapp, PharmD, BCIDP, AAHIVP, [facility] 09/21/2018, 10:34 AM

## 2018-09-22 ENCOUNTER — Telehealth: Payer: Self-pay | Admitting: Pharmacist

## 2018-09-22 LAB — T-HELPER CELL (CD4) - (RCID CLINIC ONLY)
CD4 % Helper T Cell: 6 % — ABNORMAL LOW (ref 33–55)
CD4 T Cell Abs: 90 /uL — ABNORMAL LOW (ref 400–2700)

## 2018-09-22 LAB — URINE CYTOLOGY ANCILLARY ONLY
CHLAMYDIA, DNA PROBE: NEGATIVE
NEISSERIA GONORRHEA: NEGATIVE

## 2018-09-22 NOTE — Telephone Encounter (Signed)
Ok VERY confusing eh?

## 2018-09-22 NOTE — Telephone Encounter (Signed)
It turns out that the pyrimethamine is actually covered by [employer]. It just has to come from the [facility] in [place]. He was initially approved for patient assistance but is now getting it from [employer]. Will call patient's family tomorrow and make sure they understand where the medication is coming from.

## 2018-09-23 LAB — CBC WITH DIFFERENTIAL/PLATELET
ABSOLUTE MONOCYTES: 902 {cells}/uL (ref 200–950)
BASOS PCT: 1.1 %
Basophils Absolute: 90 cells/uL (ref 0–200)
Eosinophils Absolute: 1952 cells/uL — ABNORMAL HIGH (ref 15–500)
Eosinophils Relative: 23.8 %
HEMATOCRIT: 37.5 % — AB (ref 38.5–50.0)
Hemoglobin: 12.5 g/dL — ABNORMAL LOW (ref 13.2–17.1)
Lymphs Abs: 1566 cells/uL (ref 850–3900)
MCH: 29.6 pg (ref 27.0–33.0)
MCHC: 33.3 g/dL (ref 32.0–36.0)
MCV: 88.9 fL (ref 80.0–100.0)
MPV: 9.6 fL (ref 7.5–12.5)
Monocytes Relative: 11 %
Neutro Abs: 3690 cells/uL (ref 1500–7800)
Neutrophils Relative %: 45 %
Platelets: 403 10*3/uL — ABNORMAL HIGH (ref 140–400)
RBC: 4.22 10*6/uL (ref 4.20–5.80)
RDW: 16.6 % — ABNORMAL HIGH (ref 11.0–15.0)
TOTAL LYMPHOCYTE: 19.1 %
WBC: 8.2 10*3/uL (ref 3.8–10.8)

## 2018-09-23 LAB — COMPLETE METABOLIC PANEL WITH GFR
AG Ratio: 1 (calc) (ref 1.0–2.5)
ALT: 33 U/L (ref 9–46)
AST: 32 U/L (ref 10–40)
Albumin: 4.4 g/dL (ref 3.6–5.1)
Alkaline phosphatase (APISO): 100 U/L (ref 40–115)
BUN: 14 mg/dL (ref 7–25)
CO2: 25 mmol/L (ref 20–32)
CREATININE: 0.9 mg/dL (ref 0.60–1.35)
Calcium: 9.6 mg/dL (ref 8.6–10.3)
Chloride: 100 mmol/L (ref 98–110)
GFR, Est African American: 134 mL/min/{1.73_m2} (ref >=60)
GFR, Est Non African American: 116 mL/min/{1.73_m2} (ref >=60)
Globulin: 4.3 g/dL (calc) — ABNORMAL HIGH (ref 1.9–3.7)
Glucose, Bld: 84 mg/dL (ref 65–99)
Potassium: 4.1 mmol/L (ref 3.5–5.3)
SODIUM: 134 mmol/L — AB (ref 135–146)
Total Bilirubin: 0.9 mg/dL (ref 0.2–1.2)
Total Protein: 8.7 g/dL — ABNORMAL HIGH (ref 6.1–8.1)

## 2018-09-23 LAB — LIPID PANEL
Cholesterol: 129 mg/dL (ref <200)
HDL: 25 mg/dL — ABNORMAL LOW (ref >40)
LDL Cholesterol (Calc): 64 mg/dL (calc)
Non-HDL Cholesterol (Calc): 104 mg/dL (calc) (ref <130)
Total CHOL/HDL Ratio: 5.2 (calc) — ABNORMAL HIGH (ref <5.0)
Triglycerides: 334 mg/dL — ABNORMAL HIGH (ref <150)

## 2018-09-23 LAB — HIV-1 RNA QUANT-NO REFLEX-BLD
HIV 1 RNA Quant: 199 copies/mL — ABNORMAL HIGH
HIV-1 RNA Quant, Log: 2.3 Log copies/mL — ABNORMAL HIGH

## 2018-09-23 NOTE — Telephone Encounter (Signed)
Yes! Very confusing.

## 2018-09-28 ENCOUNTER — Encounter: Payer: Self-pay | Admitting: Physical Medicine & Rehabilitation

## 2018-09-28 ENCOUNTER — Encounter: Payer: Self-pay | Attending: Physical Medicine & Rehabilitation | Admitting: Physical Medicine & Rehabilitation

## 2018-09-28 VITALS — BP 133/89 | HR 96 | Resp 14 | Ht 65.0 in | Wt 166.0 lb

## 2018-09-28 DIAGNOSIS — R531 Weakness: Secondary | ICD-10-CM | POA: Insufficient documentation

## 2018-09-28 DIAGNOSIS — B582 Toxoplasma meningoencephalitis: Secondary | ICD-10-CM | POA: Insufficient documentation

## 2018-09-28 LAB — ACID FAST CULTURE WITH REFLEXED SENSITIVITIES (MYCOBACTERIA): Acid Fast Culture: NEGATIVE

## 2018-09-28 NOTE — Progress Notes (Signed)
Subjective:    Patient ID: Thomas Park, male    DOB: 1990-11-18, 28 y.o.   MRN: [id number]  HPI   Patient is here in follow-up of his cerebral toxoplasmosis and rehab admission.  He was discharged from rehab on December 20.  He has received home health therapy through advanced.  They had contacted our office last week about transitioning to outpatient therapies.  Overall patient has report no new problems.  Appetite is good.  He denies any pain.  He is sleeping well.  Mood has been upbeat.  Family is with him and is supportive.  He still requires hands-on supervision for balance.  There have been no falls or mishaps.  Bowel and bladder both seem to be working fine.  He is maintain follow-up with the infectious disease service as well.  He does report that his vision still is blurry through his right eye, and that he still has double vision.  He has seen ophthalmology in follow-up who report per the family that he has nothing "wrong with the eye".  He is numb on left arm and leg.   Pain Inventory Average Pain 0 Pain Right Now 0 My pain is no pain  In the last 24 hours, has pain interfered with the following? General activity 0 Relation with others 0 Enjoyment of life 0 What TIME of day is your pain at its worst? daytime Sleep (in general) Good  Pain is worse with: sitting Pain improves with: rest Relief from Meds: no pain  Mobility walk with assistance use a walker ability to climb steps?  yes  Function retired  Neuro/Psych trouble walking  Prior Studies hospital follow up  Physicians involved in your care hospital follow up   History reviewed. No pertinent family history. Social History   Socioeconomic History  . Marital status: Single    Spouse name: Not on file  . Number of children: Not on file  . Years of education: Not on file  . Highest education level: Not on file  Occupational History  . Not on file  Social Needs  . Financial resource  strain: Not on file  . Food insecurity:    Worry: Not on file    Inability: Not on file  . Transportation needs:    Medical: Not on file    Non-medical: Not on file  Tobacco Use  . Smoking status: Never Smoker  . Smokeless tobacco: Never Used  Substance and Sexual Activity  . Alcohol use: Yes    Alcohol/week: 6.0 standard drinks    Types: 6 Cans of beer per week    Comment: 08/10/2018 "drink only on weekend"  . Drug use: Not Currently    Comment: 08/10/2018 "have tried coke; been a long time since I've usd any"  . Sexual activity: Not Currently  Lifestyle  . Physical activity:    Days per week: Not on file    Minutes per session: Not on file  . Stress: Not on file  Relationships  . Social connections:    Talks on phone: Not on file    Gets together: Not on file    Attends religious service: Not on file    Active member of club or organization: Not on file    Attends meetings of clubs or organizations: Not on file    Relationship status: Not on file  Other Topics Concern  . Not on file  Social History Narrative  . Not on file  Past Surgical History:  Procedure Laterality Date  . NO PAST SURGERIES     Past Medical History:  Diagnosis Date  . Anemia    [name] 08/10/2018  . HIV (human immunodeficiency virus infection) ([place])    dx'd 07/2018  . 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 Neuropathy 09/21/2018  . 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 Right sided weakness 09/21/2018  . Tongue lesion 09/21/2018  . 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  Review of Systems  Constitutional: Negative.   HENT: Negative.   Eyes: Negative.   Respiratory: Negative.   Cardiovascular: Negative.     Gastrointestinal: Negative.   Endocrine: Negative.   Genitourinary: Negative.   Musculoskeletal: Positive for gait problem.  Skin: Negative.   Allergic/Immunologic: Negative.   Hematological: Negative.   Psychiatric/Behavioral: Negative.   All other systems reviewed and are negative.      Objective:   Physical Exam  General: Alert and oriented x 3, No apparent distress HEENT: Head is normocephalic, atraumatic, PERRLA, EOMI, sclera appears slightly irritated in the right eye., oral mucosa pink and moist, dentition intact, ext ear canals clear,  Neck: Supple without JVD or lymphadenopathy Heart: Reg rate and rhythm. No murmurs rubs or gallops Chest: CTA bilaterally without wheezes, rales, or rhonchi; no distress Abdomen: Soft, non-tender, non-distended, bowel sounds positive. Extremities: No clubbing, cyanosis, or edema. Pulses are 2+ Skin: Clean and intact without signs of breakdown Neuro: Patient displays much better visual tracking.  Right eyes still lag somewhat during visual testing.  No frank nystagmus.  He lacks visual acuity with the right eye when we tested the eyes separately.  He reported fuzziness or blurriness.  Patient with improved attention and awareness.  He still struggles with basic processing and cognition.  He did follow most simple commands.  I had him walk for me today and he quickly loses balance to the left side.  He is a bit impulsive with movements.  Sensory exam is normal on right. Decreased sensation to LT and Pain on left. . Reflexes are 2+ in all 4's.  4-5 out of 5 right more than left. Musculoskeletal: Full ROM, No pain with AROM or PROM in the neck, trunk, or extremities. Posture appropriate Psych: Pt's affect is appropriate. Pt is cooperative         Assessment & Plan:  1.Decreased functional mobility with cognitive deficitssecondary to acute encephalopathy related toxoplasmosis in the setting of immunosuppression due to recently diagnosed HIV.    -He has made nice neurological and functional gains so far             -transition to outpt therapies.  (PT, OT, speech therapy) 2.  Pain Management:Tylenol as needed 3. Mood:Emotional support by family.  Patient appears upbeat at this point 4. Neuropsych: This patientis notcapable of making decisions on hisown behalf. 5. HIV. plan per ID, family appears unaware of this diagnosis 6.  Vision: Patient demonstrates much improvements in extraocular eye movements.  I would expect further recovery.  Defer to ophthalmology regarding visual acuity in the right eye.  I suspect some of his scleral irritation is causing  some of his visual difficulty.  Continue with eyedrops as directed          Otherwise the patient is doing quite well.  I did make any new recommendations.  Discussed the importance of regular sleep, medication/MD compliance and appropriate diet.  I will see him back here in about 2 months time. Fifteen minutes of face to face patient care time were spent during this visit. All questions were encouraged and answered.

## 2018-09-28 NOTE — Patient Instructions (Signed)
PLEASE FEEL FREE TO CALL OUR OFFICE WITH ANY PROBLEMS OR QUESTIONS ([phone])      

## 2018-10-06 ENCOUNTER — Ambulatory Visit (INDEPENDENT_AMBULATORY_CARE_PROVIDER_SITE_OTHER): Payer: Self-pay | Admitting: Pharmacist

## 2018-10-06 DIAGNOSIS — B582 Toxoplasma meningoencephalitis: Secondary | ICD-10-CM

## 2018-10-06 DIAGNOSIS — B20 Human immunodeficiency virus [HIV] disease: Secondary | ICD-10-CM

## 2018-10-06 NOTE — Progress Notes (Signed)
HPI: Thomas Park is a 28 y.o. male presenting to [facility] for a 2 week follow-up appointment to assess his HIV and CNS toxoplasmosis diagnoses.  Patient Active Problem List   Diagnosis Date Noted  . 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[place]) 09/21/2018  . 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 Tongue lesion 09/21/2018  . Right sided weakness 09/21/2018  . Neuropathy 09/21/2018  . 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 Drug rash   . Neurologic gait disorder   . Brain lesion   . Confusion   . Hyponatremia   . Substance abuse ([place])   . Cerebritis 08/10/2018  . AIDS ([place])   . 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   BICTEGRAVIR-EMTRICITABINE-TENOFOVIR AF (BIKTARVY) 50-200-25 MG TABS TABLET    Take 1 tablet by mouth daily.   CLINDAMYCIN (CLEOCIN) 300 MG CAPSULE    Take 2 capsules (600 mg total) by mouth every 6 (six) hours.   DAPSONE 25 MG TABLET    Take 2 tablets (50 mg total) by mouth daily.   GABAPENTIN (NEURONTIN) 100 MG CAPSULE    Take 1 capsule (100 mg total) by mouth 2 (two) times daily between meals as needed.   LEUCOVORIN (WELLCOVORIN) 25 MG TABLET    Take 1 tablet (25 mg total) by mouth daily.   ONDANSETRON (ZOFRAN) 4 MG TABLET    Take 4 mg by mouth every 8 (eight) hours as needed for nausea or vomiting.   PYRIMETHAMINE (DARAPRIM) 25 MG TABLET    Take 3 tablets (75 mg total) by mouth daily with breakfast.  Modified Medications   No medications on file  Discontinued Medications   No medications on file    Allergies: Allergies  Allergen Reactions  . Sulfa Antibiotics Rash    Past Medical History: Past Medical History:  Diagnosis Date  . Anemia    Thomas Park 08/10/2018  . HIV (human immunodeficiency virus  infection) ([place])    dx'd 07/2018  . 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 Neuropathy 09/21/2018  . 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[place]) 09/21/2018  . Right sided weakness 09/21/2018  . Tongue lesion 09/21/2018  . 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 Marital status: Single    Spouse name: Not on file  . Number of children: Not on file  . Years of education: Not on file  . Highest education level: Not on file  Occupational History  . Not on file  Social Needs  . Financial resource strain: Not on file  . Food insecurity:    Worry: Not on file    Inability: Not on file  . Transportation needs:    Medical: Not on file    Non-medical: Not on file  Tobacco Use  . Smoking status: Never Smoker  . Smokeless tobacco: Never Used  Substance and Sexual Activity  . Alcohol use: Yes    Alcohol/week: 6.0 standard drinks    Types: 6 Cans of beer per week    Comment: 08/10/2018 "drink only on weekend"  . Drug use: Not Currently    Comment: 08/10/2018 "have tried coke; been  a long time since I've usd any"  . Sexual activity: Not Currently  Lifestyle  . Physical activity:    Days per week: Not on file    Minutes per session: Not on file  . Stress: Not on file  Relationships  . Social connections:    Talks on phone: Not on file    Gets together: Not on file    Attends religious service: Not on file    Active member of club or organization: Not on file    Attends meetings of clubs or organizations: Not on file    Relationship status: Not on file  Other Topics Concern  . Not on file  Social History Narrative  . Not on file    Labs: Lab Results  Component Value Date   HIV1RNAQUANT 199 (H) 09/21/2018   HIV1RNAQUANT 669,000 08/25/2018   HIV1RNAQUANT 319,000 08/10/2018   CD4TABS 90 (L) 09/21/2018   CD4TABS 10 (L) 08/10/2018    RPR and STI Lab Results    Component Value Date   LABRPR Non Reactive 08/12/2018   LABRPR Non Reactive 08/11/2018    STI Results GC CT  09/21/2018 Negative Negative    Hepatitis B Lab Results  Component Value Date   HEPBSAG Negative 08/12/2018   Hepatitis C No results found for: HEPCAB, HCVRNAPCRQN Hepatitis A Lab Results  Component Value Date   HAV Positive (A) 08/12/2018   Lipids: Lab Results  Component Value Date   CHOL 129 09/21/2018   TRIG 334 (H) 09/21/2018   HDL 25 (L) 09/21/2018   CHOLHDL 5.2 (H) 09/21/2018   [place] 64 09/21/2018    Current HIV Regimen: Biktarvy  Assessment: Thomas Park is here today for a two week follow-up appointment for recent diagnosis of HIV and CNS toxoplasmosis infection. He is accompanied by two family members and an interpreter is here as well to facilitate the visit. His sister reports that he is doing well with his medications and does not miss any doses. He was initially experiencing some nausea but this is being controlled with Zofran. Patient denies having any diarrhea but encouraged to call the office right away if he does begin experiencing this. Spent some time going over each medication to ensure they were all being taken correctly and discussed what each medication is being used for. Encouraged calling the pharmacy when there is about 5-7 days left of medications to obtain refills to avoid any gaps in therapy. The clindamycin was originally filled at the [place] [facility] care pharmacy but informed the family that they will be able to get a refill at [facility] with the rest of his medications. His family reports that he is doing well otherwise and making improvements since we saw him two weeks ago.  His last HIV RNA on 08/25/2018 was 669,000 and his most recent CD4 count on 09/21/2018 was 90. Will reorder these labs today to ensure these numbers are improving with Biktarvy. Will also order CMET and CBC w/ differential for additional monitoring while on  treatment for toxoplasmosis infection. Reminded patient and family of his appointment with Dr. Tommy Medal next Thursday. They have no other questions or concerns at this time but encouraged to call the office should anything come up.    Plan: -HIV RNA, CD4, CMET, CBC w/ diff -Continue Biktarvy -Continue Dapsone -Continue clindamycin, leucovorin, pyrimethamine  -Follow-up with Dr. Tommy Medal on 1/30 at [place] 10/06/2018, 10:04 AM

## 2018-10-07 LAB — T-HELPER CELL (CD4) - (RCID CLINIC ONLY)
CD4 % Helper T Cell: 5 % — ABNORMAL LOW (ref 33–55)
CD4 T Cell Abs: 100 /uL — ABNORMAL LOW (ref 400–2700)

## 2018-10-08 LAB — COMPREHENSIVE METABOLIC PANEL
AG Ratio: 1.1 (calc) (ref 1.0–2.5)
ALT: 29 U/L (ref 9–46)
AST: 30 U/L (ref 10–40)
Albumin: 4.4 g/dL (ref 3.6–5.1)
Alkaline phosphatase (APISO): 92 U/L (ref 40–115)
BUN: 12 mg/dL (ref 7–25)
CO2: 26 mmol/L (ref 20–32)
Calcium: 9.6 mg/dL (ref 8.6–10.3)
Chloride: 102 mmol/L (ref 98–110)
Creat: 0.84 mg/dL (ref 0.60–1.35)
Globulin: 4.1 g/dL (calc) — ABNORMAL HIGH (ref 1.9–3.7)
Glucose, Bld: 88 mg/dL (ref 65–99)
Potassium: 4.4 mmol/L (ref 3.5–5.3)
Sodium: 137 mmol/L (ref 135–146)
Total Bilirubin: 0.7 mg/dL (ref 0.2–1.2)
Total Protein: 8.5 g/dL — ABNORMAL HIGH (ref 6.1–8.1)

## 2018-10-08 LAB — CBC WITH DIFFERENTIAL/PLATELET
Absolute Monocytes: 749 cells/uL (ref 200–950)
Basophils Absolute: 58 cells/uL (ref 0–200)
Basophils Relative: 0.8 %
Eosinophils Absolute: 1008 cells/uL — ABNORMAL HIGH (ref 15–500)
Eosinophils Relative: 14 %
HCT: 37.7 % — ABNORMAL LOW (ref 38.5–50.0)
Hemoglobin: 12.7 g/dL — ABNORMAL LOW (ref 13.2–17.1)
Lymphs Abs: 1836 cells/uL (ref 850–3900)
MCH: 31.8 pg (ref 27.0–33.0)
MCHC: 33.7 g/dL (ref 32.0–36.0)
MCV: 94.5 fL (ref 80.0–100.0)
MPV: 9.3 fL (ref 7.5–12.5)
Monocytes Relative: 10.4 %
Neutro Abs: 3550 cells/uL (ref 1500–7800)
Neutrophils Relative %: 49.3 %
Platelets: 395 10*3/uL (ref 140–400)
RBC: 3.99 10*6/uL — ABNORMAL LOW (ref 4.20–5.80)
RDW: 14.3 % (ref 11.0–15.0)
Total Lymphocyte: 25.5 %
WBC: 7.2 10*3/uL (ref 3.8–10.8)

## 2018-10-08 LAB — HIV-1 RNA QUANT-NO REFLEX-BLD
HIV 1 RNA Quant: 248 copies/mL — ABNORMAL HIGH
HIV-1 RNA Quant, Log: 2.39 Log copies/mL — ABNORMAL HIGH

## 2018-10-10 ENCOUNTER — Ambulatory Visit: Payer: Self-pay | Admitting: Speech Pathology

## 2018-10-10 ENCOUNTER — Encounter: Payer: Self-pay | Admitting: Physical Therapy

## 2018-10-10 ENCOUNTER — Ambulatory Visit: Payer: Self-pay | Admitting: Occupational Therapy

## 2018-10-10 ENCOUNTER — Other Ambulatory Visit: Payer: Self-pay

## 2018-10-10 ENCOUNTER — Ambulatory Visit: Payer: Self-pay | Attending: Family Medicine | Admitting: Physical Therapy

## 2018-10-10 ENCOUNTER — Encounter: Payer: Self-pay | Admitting: Occupational Therapy

## 2018-10-10 DIAGNOSIS — R4701 Aphasia: Secondary | ICD-10-CM

## 2018-10-10 DIAGNOSIS — R2681 Unsteadiness on feet: Secondary | ICD-10-CM | POA: Insufficient documentation

## 2018-10-10 DIAGNOSIS — M6281 Muscle weakness (generalized): Secondary | ICD-10-CM | POA: Insufficient documentation

## 2018-10-10 DIAGNOSIS — R29818 Other symptoms and signs involving the nervous system: Secondary | ICD-10-CM | POA: Insufficient documentation

## 2018-10-10 DIAGNOSIS — R2689 Other abnormalities of gait and mobility: Secondary | ICD-10-CM | POA: Insufficient documentation

## 2018-10-10 DIAGNOSIS — R41841 Cognitive communication deficit: Secondary | ICD-10-CM | POA: Insufficient documentation

## 2018-10-10 NOTE — Therapy (Signed)
[facility] [address] [place], [place], [address] Phone: [phone]   Fax:  [phone]  Physical Therapy Evaluation  Patient Details  Name: Thomas Park MRN: [id number] Date of Birth: [birth year] Referring Provider (PT): Meredith Staggers, MD   Encounter Date: 10/10/2018  PT End of Session - 10/10/18 0949    Visit Number  1    Number of Visits  13    Date for PT Re-Evaluation  11/09/18    Authorization Type  Self pay (per family has applied for Medicaid and told it can take 6-8 weeks)    PT Start Time  0810   interpreter late arrival   PT Stop Time  0846    PT Time Calculation (min)  36 min    Equipment Utilized During Treatment  Gait belt    Activity Tolerance  Patient tolerated treatment well    Behavior During Therapy  Impulsive       Past Medical History:  Diagnosis Date  . Anemia    [name] 08/10/2018  . HIV (human immunodeficiency virus infection) ([place])    dx'd 07/2018  . 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 Neuropathy 09/21/2018  . 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[place]) 09/21/2018  . Right sided weakness 09/21/2018  . Tongue lesion 09/21/2018  . 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 NO PAST SURGERIES      There were no vitals filed for this visit.   Subjective Assessment - 10/10/18 0813    Subjective  [name]; reports his weakness decr sensation are in his left leg. Reports walking on the lateral border of his right foot is also new. (Family agrees)    Patient is accompained by:  Family member   father, sister   Pertinent History  recently diagnosed HIV/AIDS, neuropathy, substance abuse, cerebritis    Diagnostic tests  MRI multiple lesions of the left sided basal ganglia consistent with toxoplasmosis; Mild to moderate vasogenic edema with regional mass effect  greatest at the left basal ganglia    Patient Stated Goals  be able to walk without falling    Currently in Pain?  No/denies    Pain Score  0-No pain         [facility] PT Assessment - 10/10/18 0815      Assessment   Medical Diagnosis  Right sided weakness; CNS toxoplasmosis    Referring Provider (PT)  Meredith Staggers, MD    Onset Date/Surgical Date  08/10/18   adm to hospital;    Prior Therapy  d/c'd CIR 12/20, HHPT recently d/c'd      Precautions   Precautions  Fall      Balance Screen   Has the patient fallen in the past 6 months  No   near falls   Has the patient had a decrease in activity level because of a fear of falling?   No    Is the patient reluctant to leave their home because of a fear of falling?   No      Home Film/video editor residence    Freight forwarder;Other relatives   father, 3 sisters   Available Help at Discharge  Family;Available 24 hours/day    Type of [place] entrance   back door; front door 2 steps  no rail   Home Layout  One level    English as a second language teacher - 2 wheels    Additional Comments  sister reports wide RW (compared to clinic RW); family walks with him vs using the RW      Prior Function   Level of Independence  Independent    Vocation  Full time employment    Music therapist      Cognition   Overall Cognitive Status  Impaired/Different from baseline   defer to SLP and OT     Observation/Other Assessments   Observations  walks from lobby with father holding onto his waistband and RUE    Focus on Therapeutic Outcomes (FOTO)   NA      Sensation   Light Touch  Impaired by gross assessment   LLE decr sensation compared to RLE (confirmed twice LLE)     Coordination   Gross Motor Movements are Fluid and Coordinated  No    Fine Motor Movements are Fluid and Coordinated  No    Coordination and Movement  Description  poor ability to quickly tap toes (becomes asynchronous); poor ability for heel to shin bil    Heel Shin Test  equally poor bil LEs      Posture/Postural Control   Posture/Postural Control  Postural limitations    Posture Comments  poor trunk control in turns      Tone   Assessment Location  Right Lower Extremity;Left Lower Extremity      ROM / Strength   AROM / PROM / Strength  AROM;Strength      AROM   Overall AROM   Within functional limits for tasks performed   LEs     Strength   Overall Strength  Deficits   LLE   Overall Strength Comments  LLE: knee ext 4, flex 3+, DF 5; RLE knee ext 4, knee flex 4, DF 5      Transfers   Transfers  Sit to Stand;Stand to Sit    Sit to Stand  4: Min guard    Five time sit to stand comments   11.93 sec   no UEs, chair; no norms for age; 6-29 yo male 8.4 sec    Stand to Sit  4: Min guard   does not fully turn prior to sitting     Ambulation/Gait   Ambulation/Gait  Yes    Ambulation/Gait Assistance  4: Min assist;3: Mod assist    Ambulation/Gait Assistance Details  generally min assist due to imbalance and veers off path (primarily to his right); turning 180 with severe LOB and required mod assist to prevent fall    Ambulation Distance (Feet)  80 Feet   120, 60 x 2, 60   Assistive device  Rolling walker;1 person hand held assist    Gait Pattern  Step-through pattern;Decreased step length - left;Decreased stride length;Decreased weight shift to left;Right steppage;Ataxic;Narrow base of support   rt foot on lateral border, then foot flat (new per pt)   Ambulation Surface  Level;Indoor    Gait velocity  32.8/11.72=2.8 ft/sec   3.57 norm for age   91  Yes    Stairs Assistance  4: Min guard    Stair Management Technique  Two rails;Alternating pattern;Forwards    Number of Stairs  8    Height of Stairs  4    Ramp  4: Min assist    Ramp Details (indicate cue type and reason)  walked very close to left edge (no rail, barrier)     Curb  4: Min assist    Curb Details (indicate cue type and reason)  minguard for safety, no LOB    Gait Comments  all testing without device; at end assessed with RW with instructional cues to stay close to RW      Functional Gait  Assessment   Gait assessed   Yes    Gait Level Surface  Walks 20 ft, slow speed, abnormal gait pattern, evidence for imbalance or deviates 10-15 in outside of the 12 in walkway width. Requires more than 7 sec to ambulate 20 ft.    Change in Gait Speed  Makes only minor adjustments to walking speed, or accomplishes a change in speed with significant gait deviations, deviates 10-15 in outside the 12 in walkway width, or changes speed but loses balance but is able to recover and continue walking.    Gait with Horizontal Head Turns  Performs head turns with moderate changes in gait velocity, slows down, deviates 10-15 in outside 12 in walkway width but recovers, can continue to walk.    Gait with Vertical Head Turns  Performs task with moderate change in gait velocity, slows down, deviates 10-15 in outside 12 in walkway width but recovers, can continue to walk.    Gait and Pivot Turn  Cannot turn safely, requires assistance to turn and stop.    Step Over Obstacle  Cannot perform without assistance.    Gait with Narrow Base of Support  Ambulates less than 4 steps heel to toe or cannot perform without assistance.    Gait with Eyes Closed  Cannot walk 20 ft without assistance, severe gait deviations or imbalance, deviates greater than 15 in outside 12 in walkway width or will not attempt task.    Ambulating Backwards  Cannot walk 20 ft without assistance, severe gait deviations or imbalance, deviates greater than 15 in outside 12 in walkway width or will not attempt task.    Steps  Cannot do safely.   2 rails, alternating, minguard assist   Total Score  4    FGA comment:  <23 high fall risk      RLE Tone   RLE Tone  Within Functional Limits      LLE Tone   LLE Tone   Within Functional Limits                Objective measurements completed on examination: See above findings.              PT Education - 10/10/18 0945    Education Details  results of testing and high fall risk; must have family member with him whenever he gets up (they have been doing this); safe use of RW and recommend he use RW for increased safety with walking (family notes it is too wide for their bathroom door--discussed going sideways thru door and that in small bathroom it may make sense to leave RW outside bathroom and use HHA); educated on recommended treatment frequency, do no know if Medicaid would go back and cover any visits prior to being approved, can speak to front desk re: applying for financial assistance; may need to choose what therapies are most important to them and possibly at lower frequency    Person(s) Educated  Patient;Parent(s);Other (comment)   sister   Methods  Explanation    Comprehension  Verbalized understanding       PT Short Term Goals -  10/10/18 1024      PT SHORT TERM GOAL #1   Title  Patient will safely complete HEP with assistance of family members (Target for all STGs 11/09/18)    Baseline  10/10/18 no current HEP    Time  4    Period  Weeks    Status  New    Target Date  11/09/18      PT SHORT TERM GOAL #2   Title  Patient will ambulate 150 ft in simulated home environment with LRAD and close supervision (no hands-on assist) avoiding obstacles on his right.     Baseline  10/10/18 Walks 120 ft requiring assist for balance and to stay on path >4 times.     Time  4    Period  Weeks    Status  New      PT SHORT TERM GOAL #3   Title  Patient will improve 5x sit to stand to <9 seconds     Baseline  09/30/18 11.93 sec    Time  4    Period  Weeks    Status  New      PT SHORT TERM GOAL #4   Title  Patient will improve FGA to >=15/30    Baseline  10/10/18  4/30    Time  4    Period  Weeks    Status  New        PT Long  Term Goals - 10/10/18 1029      PT LONG TERM GOAL #1   Title  Patient/family will be independent with advanced HEP (including walking program) and ways to continue community-based activity (Target for all LTGs 12/09/2018)    Time  8    Period  Weeks    Status  New    Target Date  12/09/18      PT LONG TERM GOAL #2   Title  Patient will ambulate independently in simulated home environment without device x 200 ft)    Time  8    Period  Weeks    Status  New      PT LONG TERM GOAL #3   Title  Patient will ambulate over unlevel outdoor terrain (including slopes, curbs, grass, mulch, etc) x 500 ft with supervision for safety.     Time  8    Period  Weeks    Status  New      PT LONG TERM GOAL #4   Title  Patient will improve FGA to >=23/30 demonstrating progress towards lesser fall risk.     Time  8    Period  Weeks    Status  New      PT LONG TERM GOAL #5   Title  Patient will complete 10 m gait velocity test at 3.5 ft/sec (norm for age)    Time  16    Period  Weeks    Status  New             Plan - 10/10/18 0951    Clinical Impression Statement  Patient referred to OPPT after completing both inpatient rehab and HHPT. 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 Patient has significant balance issues (even with near fall during PT evaluation) related to weakness, decr sensation, and decr vision. Patient also has decreased safety awareness putting him at an overall high fall risk. Patient can benefit from the  PT interventions listed below to address these deficits and the deficits listed below.     History and Personal Factors relevant to plan of care:  recently diagnosed HIV/AIDS, neuropathy, substance abuse, cerebritis    Clinical Presentation  Evolving    Clinical Presentation due to:  right eye remains blurry with family reporting he  nearly runs into things on the right; near falls occurring with family providing hands-on 24/7 assist    Clinical Decision Making  Moderate    Rehab Potential  Good    Clinical Impairments Affecting Rehab Potential  vision and cognitive impairments may hinder ability to become independent with mobility    PT Frequency  2x / week    PT Duration  8 weeks    PT Treatment/Interventions  ADLs/Self Care Home Management;DME Instruction;Gait training;Stair training;Functional mobility training;Therapeutic activities;Therapeutic exercise;Balance training;Orthotic Fit/Training;Patient/family education;Cognitive remediation;Neuromuscular re-education;Visual/perceptual remediation/compensation    PT Next Visit Plan  Have they had him practice walking with RW?;  ?need to give VORx1 or x2 based on OT assessment (and OT not picking pt up); establish balance HEP (try dynamic activities at counter vs static in corner); ?give pre-gait at counter     Consulted and Agree with Plan of Care  Patient;Family member/caregiver    Family Member Consulted  father and sister       Patient will benefit from skilled therapeutic intervention in order to improve the following deficits and impairments:  Abnormal gait, Decreased balance, Decreased cognition, Decreased coordination, Decreased safety awareness, Decreased mobility, Decreased knowledge of use of DME, Decreased strength, Impaired perceived functional ability, Impaired vision/preception, Impaired sensation  Visit Diagnosis: Other abnormalities of gait and mobility - Plan: PT plan of care cert/re-cert  Unsteadiness on feet - Plan: PT plan of care cert/re-cert  Muscle weakness (generalized) - Plan: PT plan of care cert/re-cert  Other symptoms and signs involving the nervous system - Plan: PT plan of care cert/re-cert     Problem List Patient Active Problem List   Diagnosis Date Noted  . 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[place]) 09/21/2018  . Kaposi's disease  09/21/2018  . Tongue lesion 09/21/2018  . Right sided weakness 09/21/2018  . Neuropathy 09/21/2018  . 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 Drug rash   . Neurologic gait disorder   . Brain lesion   . Confusion   . Hyponatremia   . Substance abuse ([place])   . Cerebritis 08/10/2018  . AIDS ([place])   . 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[place])     Rexanne Mano, PT 10/10/2018, 10:43 AM  [facility] [address] [place], [place], [address] Phone: [phone]   Fax:  [phone]  Name: Herny Scurlock MRN: [id number] Date of Birth: 1991-02-04

## 2018-10-10 NOTE — Therapy (Signed)
[facility] [address] [place], [place], [address] Phone: [phone]   Fax:  [phone]  Occupational Therapy Evaluation  Patient Details  Name: Marc Leichter MRN: [id number] Date of Birth: 1991-02-11 No data recorded  Encounter Date: 10/10/2018  OT End of Session - 10/10/18 1050    Visit Number  1    Number of Visits  1    Authorization Type  self pay;  pt applying for Medicaid and financial assistance    OT Start Time  0932    OT Stop Time  1013    OT Time Calculation (min)  41 min    Activity Tolerance  Patient tolerated treatment well       Past Medical History:  Diagnosis Date  . Anemia    [name] 08/10/2018  . HIV (human immunodeficiency virus infection) ([place])    dx'd 07/2018  . 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 Neuropathy 09/21/2018  . 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[place]) 09/21/2018  . Right sided weakness 09/21/2018  . Tongue lesion 09/21/2018  . 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 NO PAST SURGERIES      There were no vitals filed for this visit.  Subjective Assessment - 10/10/18 0935    Subjective   Sometimes I have pain in my left leg and my right shoulder but not right now.     Patient is accompained by:  Family member   father and sister   Pertinent History  Pt admitted to the ER/hospital on 08/10/2018 with alterned mental status.  Pt diagnosed with toxoplasmosis due to newly diagnosed HIV.  Pt discharged home after inpt stay on 09/01/2018.  PMH: poly substance abuse    Patient Stated Goals  I want to walk better    Currently in Pain?  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    Onset Date/Surgical Date  08/11/19   admit date to hospital    Hand Dominance  Right    Prior Therapy  inpt rehab PT, OT and ST      Precautions   Precautions  Fall      Restrictions   Weight Bearing Restrictions  No      Balance Screen   Has the patient fallen in the past 6 months  No   pt had PT eval today     Home  Environment   Family/patient expects to be discharged to:  Private residence    Living Arrangements  Other relatives   2 sisters, my dad and my brother in law, 3 nephews   Available Help at Discharge  Available 24 hours/day    Type of [place]  One level    Bathroom Insurance account manager    Additional Comments  shower chair, no grab bars,       Prior Function   Level of Independence  Independent    Vocation  Full time employment    Vocation Requirements  Armed forces operational officer      ADL   Eating/Feeding  Independent    Grooming  Independent    Upper Body Bathing  Supervision/safety  Lower Body Bathing  Min guard    Upper Body Dressing  Supervision/safety    Lower Body Dressing  Minimal assistance   for balance   Toilet Transfer  Min guard    Toileting - Clothing Manipulation  --   supervision for balance   Toileting -  Hygiene  Independent    Tub/Shower Transfer  Min guard    ADL comments  family reports pt is does not need vc's but does need assistance for balance whenever standing or walking      IADL   Shopping  Completely unable to shop    Light Housekeeping  Does not participate in any housekeeping tasks    Meal Prep  Needs to have meals prepared and served    [employer] on family or friends for transportation    Medication Management  Is not capable of dispensing or managing own medication      Mobility   Mobility Status  Needs assist    Mobility Status Comments  pt wth frequent LOB and high risk for falls - see PT eval      Written Expression   Dominant Hand  Right      Vision - History   Baseline Vision  No visual deficits     Additional Comments  blurry vision in R eye, both with static gaze and worse with eye movement.       Vision Assessment   Eye Alignment  Impaired (comment)    Ocular Range of Motion  Within Functional Limits    Tracking/Visual Pursuits  Able to track stimulus in all quads without difficulty    Acuity  --   pt reports blurry vision in R eye   Depth Perception  --   with R eye; pt reports burning in R eye worse with light.   Comment  Pt with gaze stabilization deficits greater with horizontal head turns than vertical head turns.  Blurriness in R eye increased with eye movement and head movement.  Pt reports occassional burning in R eye and light sensitivity.  R eye red and irritated.       Activity Tolerance   Activity Tolerance  Tolerate 30+ min activity without fatigue      Cognition   Overall Cognitive Status  Impaired/Different from baseline    Attention  Alternating;Divided    Memory  Impaired    Awareness  Impaired    Problem Solving  Impaired    Cognition Comments  see ST eval      Sensation   Light Touch  Appears Intact    Hot/Cold  Appears Intact    Proprioception  Appears Intact    Additional Comments  For RUE and LUE      Coordination   Gross Motor Movements are Fluid and Coordinated  Yes    Fine Motor Movements are Fluid and Coordinated  Yes    Other  Finger to Nose impaired by vision - when pt closes R eye finger to nose improves.       Tone   Assessment Location  Right Upper Extremity;Left Upper Extremity      ROM / Strength   AROM / PROM / Strength  AROM;Strength      AROM   Overall AROM   Within functional limits for tasks performed      Strength   Overall Strength  Within functional limits for tasks performed    Overall Strength Comments  BUE's      Hand  Function   Right Hand Gross Grasp  Functional    Right Hand Grip (lbs)  85    Left Hand Gross Grasp  Functional    Left Hand Grip (lbs)  85      RUE Tone   RUE Tone  Within Functional Limits                            OT Long Term Goals - 10/10/18 1045      OT LONG TERM GOAL #1   Title  n/a            Plan - 10/10/18 1045    Clinical Impression Statement  Pt is a 28 year old male s/p admission on 08/10/2018 for altered mental status. Pt diagnosed with toxoplasmosis due to newly diagnosed HIV, acute encephalopathy.  Pt discharged home with family after inpt rehab (PT, OT and ST) on 09/01/2018.  Pt presents today with decreased balance, visual vestibular deficits (gaze stabilization), blurry vision in R eye and cognitive deficits.  Pt and family have no insurance and very limited funding and wish to only do PT and ST at this time (Pt to address balance and visual vestibular and ST to address cognitive deficits). Pt does requires assist with ADL's however this is due to decreased balance which PT will addresss.  No further OT recommended at this time per pt and family request.     Occupational Profile and client history currently impacting functional performance  son, brother, uncle, employee. PMH:  poly substance abuse    Occupational performance deficits (Please refer to evaluation for details):  ADL's;IADL's;Work;Leisure;Social Participation    Rehab Potential  --   n/a   Plan  no further OT at this time per pt and family request.    Clinical Decision Making  Several treatment options, min-mod task modification necessary    Consulted and Agree with Plan of Care  Patient;Family member/caregiver    Family Member Consulted  sister, father       Patient will benefit from skilled therapeutic intervention in order to improve the following deficits and impairments:  Decreased coping skills(n/a)  Visit Diagnosis: Unsteadiness on feet    Problem List Patient Active Problem List   Diagnosis Date Noted  . 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[place]) 09/21/2018  . 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 Tongue lesion 09/21/2018  . Right sided weakness 09/21/2018  .  Neuropathy 09/21/2018  . 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 Drug rash   . Neurologic gait disorder   . Brain lesion   . Confusion   . Hyponatremia   . Substance abuse ([place])   . Cerebritis 08/10/2018  . AIDS ([place])   . CNS toxoplasmosis [facility])     Quay Burow, OTR/L 10/10/2018, 10:51 AM  [facility] [address] [place], [place], [address] Phone: [phone]   Fax:  [phone]  Name: Darrly Loberg MRN: [id number] Date of Birth: 1990-10-25

## 2018-10-12 NOTE — Therapy (Signed)
[facility] [address] [place], [place], [address] Phone: [phone]   Fax:  [phone]  Speech Language Pathology Evaluation  Patient Details  Name: Thomas Park MRN: [id number] Date of Birth: 1990-10-15 Referring Provider (SLP): Dr. Naaman Plummer   Encounter Date: 10/10/2018  End of Session - 10/12/18 1337    Visit Number  1    Number of Visits  17    Date for SLP Re-Evaluation  12/09/18    Authorization Type  self-pay; has applied for Medicaid, financial assistance    SLP Start Time  0848    SLP Stop Time   0930    SLP Time Calculation (min)  42 min    Activity Tolerance  Patient tolerated treatment well       Past Medical History:  Diagnosis Date  . Anemia    [name] 08/10/2018  . HIV (human immunodeficiency virus infection) ([place])    dx'd 07/2018  . 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 Neuropathy 09/21/2018  . 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 Right sided weakness 09/21/2018  . Tongue lesion 09/21/2018  . 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 NO PAST SURGERIES      There were no vitals filed for this visit.      SLP Evaluation [facility] - 10/12/18 0001      SLP Visit Information   SLP Received On  10/10/18    Referring Provider (SLP)  Dr. Naaman Plummer    Onset Date  08/10/18    Medical Diagnosis   CNS toxoplasmosis      Subjective   Patient/Family Stated Goal  for pt to be more independent      General Information   HPI  Thomas Park is a 28 year old right handed non-English-speaking male with history of polysubstance abuse, admitted to hospital 08/10/2018 and diagnosed with CNS toxoplasmosis d/t recently diagnosed HIV. D/c home after inpt rehab 09/01/2018.     Behavioral/Cognition  alert, distractible    Mobility Status  ambulated with gait  belt, family assistance      Balance Screen   Has the patient fallen in the past 6 months  No   see PT eval for details     Prior Functional Status   Cognitive/Linguistic Baseline  Within functional limits    Type of [place] With  Family    Available Support  Family;Available 24 hours/day    Vocation  Full time employment   [employer]     Cognition   Overall Cognitive Status  Impaired/Different from baseline    Attention  Selective;Alternating;Divided   sustained attention 15 minutes at time of d/c from rehab   Selective Attention  Impaired    Selective Attention Impairment  Verbal basic;Functional basic    Memory  Impaired    Memory Impairment  Decreased short term memory;Decreased long term memory;Decreased recall of new information    Decreased Long Term Memory  Functional basic;Verbal basic   x address, sister reports poor recall of past family events   Decreased Short Term Memory  Verbal basic;Functional basic   story retelling score 1/10 (recalled 0 details independently   Awareness  Impaired   pt aware of slow processing, decr'd attn, memory   Awareness Impairment  Emergent impairment;Anticipatory impairment   unaware of symbol cancellation,  clock drawing errors.   Problem Solving  Impaired    Problem Solving Impairment  Verbal basic;Functional basic   slow processing   Executive Function  --   clock drawing severely impaired (3/13)     Auditory Comprehension   Overall Auditory Comprehension  Impaired    Commands  Impaired    One Step Basic Commands  75-100% accurate    Conversation  Simple    Interfering Components  Attention;Processing speed;Working Statistician Comprehension Comments  difficult to assess via interpreter; pt appears to need rephrasing frequently      Visual Recognition/Discrimination   Discrimination  Not tested      Reading Comprehension   Reading Status  Not tested      Expression   Primary Mode of  Expression  Verbal      Verbal Expression   Overall Verbal Expression  Other (comment)   question some mild wordfinding deficits   Naming  Impairment    Confrontation  Other (comment)   necktie: extended time and semantic cues   Pragmatics  Impairment    Impairments  Abnormal affect;Eye contact    Interfering Components  Attention      Written Expression   Dominant Hand  Right    Written Expression  Not tested      Oral Motor/Sensory Function   Overall Oral Motor/Sensory Function  Appears within functional limits for tasks assessed      Standardized Assessments   Standardized Assessments   Cognitive Linguistic Quick Test   initiated today; will complete in subsequent sessions                     SLP Education - 10/12/18 1328    Education Details  deficit areas, proposed therapy goals     Person(s) Educated  Patient;Parent(s);Other (comment)   sister   Methods  Explanation;Other (comment)   interpreter   Comprehension  Verbalized understanding;Need further instruction       SLP Short Term Goals - 10/12/18 1339      SLP SHORT TERM GOAL #1   Title  Pt will complete standardized cognitive testing    Time  2    Period  Weeks    Status  New      SLP SHORT TERM GOAL #2   Title  Pt will demonstrate selective attention in a min distracting environment for 8 minutes to achieve 85% accuracy in simple cognitive task x 3 sessions     Time  4    Period  Weeks    Status  New      SLP SHORT TERM GOAL #3   Title  Pt will utilize external aids (paper or phone) to manage his daily schedule, appointments, medical/therapy information with occasional mod A x 3     Time  4    Period  Weeks    Status  New       SLP Long Term Goals - 10/12/18 1346      SLP LONG TERM GOAL #1   Title  Pt will demonstrate emergent awareness IDing 3/5 errors on simple-mod complex cognitive linguistic tasks with rare min A for double checking x 3 sessions     Time  8    Period  Weeks     Status  New      SLP LONG TERM GOAL #2   Title  Pt will demonstrate 15 minutes selective attention between 2 simple-mod complex cognitive-linguistic tasks 80% accuracy  x 3 sessions    Time  8    Period  Weeks    Status  New      SLP LONG TERM GOAL #3   Title  Pt will utilize external aids or phone to manage appointments, schedules, medical and therapy information with rare min A x 5 sessions     Time  8    Period  Weeks    Status  New       Plan - 10/12/18 1338    Clinical Impression Statement  Patient presents with what appear to be severe cognitive deficits (Cognitive Linguistic Quick Test initiated and to be completed in next 1-3 sessions, with goals added PRN) and possible language deficits as well. Short-term memory is severely impaired, and pt with deficits in selective and higher level attention. Sister reports pt needs frequent assist for orientation to date. Pt stated today that he is 61 or 28 years old (pt is 20). Difficult to determine language deficits vs cognitive deficits (attention, working memory, processing) impacting expression/comprehension given language barrier, despite use of an interpreter. Processing appears slow; interpreter noted to pause and rephrase instructions throughout evaluation. Family expresses a desire for pt to be more independent. They have limited financial resources but have applied for Medicaid and financial assistance. I recommend skilled ST to maximize cognition for improved independence, safety, quality of life and to decrease burden of care.     Speech Therapy Frequency  2x / week    Duration  --   8 weeks or 17 total visits   Treatment/Interventions  Cognitive reorganization;Environmental controls;Multimodal communcation approach;Compensatory strategies;Cueing hierarchy;Internal/external aids;Functional tasks;SLP instruction and feedback;Patient/family education    Potential to Achieve Goals  Good    Potential Considerations  Financial  resources;Severity of impairments    Consulted and Agree with Plan of Care  Patient;Family member/caregiver    Family Member Consulted  dad, sister       Patient will benefit from skilled therapeutic intervention in order to improve the following deficits and impairments:   Cognitive communication deficit  Aphasia    Problem List Patient Active Problem List   Diagnosis Date Noted  . 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[place]) 09/21/2018  . 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 Tongue lesion 09/21/2018  . Right sided weakness 09/21/2018  . Neuropathy 09/21/2018  . 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 Drug rash   . Neurologic gait disorder   . Brain lesion   . Confusion   . Hyponatremia   . Substance abuse ([place])   . Cerebritis 08/10/2018  . AIDS ([place])   . CNS toxoplasmosis [facility])    Deneise Lever, [place], CCC-SLP Speech-Language Pathologist  Aliene Altes 10/12/2018, 1:56 PM  [facility] [address] [place], [place], [address] Phone: [phone]   Fax:  [phone]  Name: Thomas Park MRN: [id number] Date of Birth: 29-Mar-1991

## 2018-10-12 NOTE — Therapy (Deleted)
[facility] [address] [place], [place], [address] Phone: [phone]   Fax:  [phone]  Speech Language Pathology Treatment  Patient Details  Name: Thomas Park MRN: [id number] Date of Birth: [birth year] Referring Provider (SLP): Dr. Naaman Plummer   Encounter Date: 10/10/2018  End of Session - 10/12/18 1337    Visit Number  1    Number of Visits  17    Date for SLP Re-Evaluation  12/09/18    Authorization Type  self-pay; has applied for Medicaid, financial assistance    SLP Start Time  0848    SLP Stop Time   0930    SLP Time Calculation (min)  42 min    Activity Tolerance  Patient tolerated treatment well       Past Medical History:  Diagnosis Date  . Anemia    [name] 08/10/2018  . HIV (human immunodeficiency virus infection) ([place])    dx'd 07/2018  . 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 Neuropathy 09/21/2018  . 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[place]) 09/21/2018  . Right sided weakness 09/21/2018  . Tongue lesion 09/21/2018  . 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 NO PAST SURGERIES      There were no vitals filed for this visit.     SLP Evaluation [facility] - 10/12/18 0001      SLP Visit Information   SLP Received On  10/10/18    Referring Provider (SLP)  Dr. Naaman Plummer    Onset Date  08/10/18    Medical Diagnosis   CNS toxoplasmosis      Subjective   Patient/Family Stated Goal  for pt to be more independent      General Information   HPI  Thomas Park is a 28 year old right handed non-English-speaking male with history of polysubstance abuse, admitted to hospital 08/10/2018 and diagnosed with CNS toxoplasmosis d/t recently diagnosed HIV. D/c home after inpt rehab 09/01/2018.     Behavioral/Cognition  alert, distractible    Mobility Status  ambulated with gait belt,  family assistance      Balance Screen   Has the patient fallen in the past 6 months  No   see PT eval for details     Prior Functional Status   Cognitive/Linguistic Baseline  Within functional limits    Type of [place] With  Family    Available Support  Family;Available 24 hours/day    Vocation  Full time employment   [employer]     Cognition   Overall Cognitive Status  Impaired/Different from baseline    Attention  Selective;Alternating;Divided   sustained attention 15 minutes at time of d/c from rehab   Selective Attention  Impaired    Selective Attention Impairment  Verbal basic;Functional basic    Memory  Impaired    Memory Impairment  Decreased short term memory;Decreased long term memory;Decreased recall of new information    Decreased Long Term Memory  Functional basic;Verbal basic   x address, sister reports poor recall of past family events   Decreased Short Term Memory  Verbal basic;Functional basic   story retelling score 1/10 (recalled 0 details independently   Awareness  Impaired   pt aware of slow processing, decr'd attn, memory   Awareness Impairment  Emergent impairment;Anticipatory impairment   unaware of symbol cancellation, clock  drawing errors.   Problem Solving  Impaired    Problem Solving Impairment  Verbal basic;Functional basic   slow processing   Executive Function  --   clock drawing severely impaired (3/13)     Auditory Comprehension   Overall Auditory Comprehension  Impaired    Commands  Impaired    One Step Basic Commands  75-100% accurate    Conversation  Simple    Interfering Components  Attention;Processing speed;Working Statistician Comprehension Comments  difficult to assess via interpreter; pt appears to need rephrasing frequently      Visual Recognition/Discrimination   Discrimination  Not tested      Reading Comprehension   Reading Status  Not tested      Expression   Primary Mode of Expression   Verbal      Verbal Expression   Overall Verbal Expression  Other (comment)   question some mild wordfinding deficits   Naming  Impairment    Confrontation  Other (comment)   necktie: extended time and semantic cues   Pragmatics  Impairment    Impairments  Abnormal affect;Eye contact    Interfering Components  Attention      Written Expression   Dominant Hand  Right    Written Expression  Not tested      Oral Motor/Sensory Function   Overall Oral Motor/Sensory Function  Appears within functional limits for tasks assessed      Standardized Assessments   Standardized Assessments   Cognitive Linguistic Quick Test   initiated today; will complete in subsequent sessions          SLP Education - 10/12/18 1328    Education Details  deficit areas, proposed therapy goals     Person(s) Educated  Patient;Parent(s);Other (comment)   sister   Methods  Explanation;Other (comment)   interpreter   Comprehension  Verbalized understanding;Need further instruction       SLP Short Term Goals - 10/12/18 1339      SLP SHORT TERM GOAL #1   Title  Pt will complete standardized cognitive testing    Time  2    Period  Weeks    Status  New      SLP SHORT TERM GOAL #2   Title  Pt will demonstrate selective attention in a min distracting environment for 8 minutes to achieve 85% accuracy in simple cognitive task x 3 sessions     Time  4    Period  Weeks    Status  New      SLP SHORT TERM GOAL #3   Title  Pt will utilize external aids (paper or phone) to manage his daily schedule, appointments, medical/therapy information with occasional mod A x 3     Time  4    Period  Weeks    Status  New       SLP Long Term Goals - 10/12/18 1346      SLP LONG TERM GOAL #1   Title  Pt will demonstrate emergent awareness IDing 3/5 errors on simple-mod complex cognitive linguistic tasks with rare min A for double checking x 3 sessions     Time  8    Period  Weeks    Status  New      SLP LONG TERM  GOAL #2   Title  Pt will demonstrate 15 minutes selective attention between 2 simple-mod complex cognitive-linguistic tasks 80% accuracy x 3 sessions    Time  8  Period  Weeks    Status  New      SLP LONG TERM GOAL #3   Title  Pt will utilize external aids or phone to manage appointments, schedules, medical and therapy information with rare min A x 5 sessions     Time  8    Period  Weeks    Status  New       Plan - 10/12/18 1338    Clinical Impression Statement  Patient presents with what appear to be severe cognitive deficits (Cognitive Linguistic Quick Test initiated and to be completed in next 1-3 sessions, with goals added PRN) and possible language deficits as well. Short-term memory is severely impaired, and pt with deficits in selective and higher level attention. Sister reports pt needs frequent assist for orientation to date. Pt stated today that he is 55 or 28 years old (pt is 50). Difficult to determine language deficits vs cognitive deficits (attention, working memory, processing) impacting expression/comprehension given language barrier, despite use of an interpreter. Processing appears slow; interpreter noted to pause and rephrase instructions throughout evaluation. Family expresses a desire for pt to be more independent. They have limited financial resources but have applied for Medicaid and financial assistance. I recommend skilled ST to maximize cognition for improved independence, safety, quality of life and to decrease burden of care.     Speech Therapy Frequency  2x / week    Duration  --   8 weeks or 17 total visits   Treatment/Interventions  Cognitive reorganization;Environmental controls;Multimodal communcation approach;Compensatory strategies;Cueing hierarchy;Internal/external aids;Functional tasks;SLP instruction and feedback;Patient/family education    Potential to Achieve Goals  Good    Potential Considerations  Financial resources;Severity of impairments     Consulted and Agree with Plan of Care  Patient;Family member/caregiver    Family Member Consulted  dad, sister       Patient will benefit from skilled therapeutic intervention in order to improve the following deficits and impairments:   Cognitive communication deficit  Aphasia    Problem List Patient Active Problem List   Diagnosis Date Noted  . 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[place]) 09/21/2018  . 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 Tongue lesion 09/21/2018  . Right sided weakness 09/21/2018  . Neuropathy 09/21/2018  . 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 Drug rash   . Neurologic gait disorder   . Brain lesion   . Confusion   . Hyponatremia   . Substance abuse ([place])   . Cerebritis 08/10/2018  . AIDS ([place])   . CNS toxoplasmosis [facility])     Aliene Altes 10/12/2018, 1:56 PM  [facility] [address] [place], [place], [address] Phone: [phone]   Fax:  [phone]   Name: Thomas Park MRN: [id number] Date of Birth: 1991-04-01

## 2018-10-13 ENCOUNTER — Ambulatory Visit (INDEPENDENT_AMBULATORY_CARE_PROVIDER_SITE_OTHER): Payer: Self-pay | Admitting: Infectious Disease

## 2018-10-13 VITALS — BP 151/85 | HR 100 | Temp 98.2°F | Wt 168.0 lb

## 2018-10-13 DIAGNOSIS — R269 Unspecified abnormalities of gait and mobility: Secondary | ICD-10-CM

## 2018-10-13 DIAGNOSIS — B582 Toxoplasma meningoencephalitis: Secondary | ICD-10-CM

## 2018-10-13 DIAGNOSIS — G0490 Encephalitis and encephalomyelitis, unspecified: Secondary | ICD-10-CM

## 2018-10-13 DIAGNOSIS — B20 Human immunodeficiency virus [HIV] disease: Secondary | ICD-10-CM

## 2018-10-13 DIAGNOSIS — Q821 Xeroderma pigmentosum: Secondary | ICD-10-CM

## 2018-10-13 DIAGNOSIS — G629 Polyneuropathy, unspecified: Secondary | ICD-10-CM

## 2018-10-13 DIAGNOSIS — I75021 Atheroembolism of right lower extremity: Secondary | ICD-10-CM

## 2018-10-13 DIAGNOSIS — K148 Other diseases of tongue: Secondary | ICD-10-CM

## 2018-10-13 MED ORDER — LEUCOVORIN CALCIUM 25 MG PO TABS: 25.0000 mg | ORAL_TABLET | Freq: Every day | ORAL | 5 refills | Status: DC

## 2018-10-13 MED ORDER — ONDANSETRON HCL 4 MG PO TABS: 4.0000 mg | ORAL_TABLET | Freq: Three times a day (TID) | ORAL | 2 refills | Status: DC | PRN

## 2018-10-13 MED ORDER — BICTEGRAVIR-EMTRICITAB-TENOFOV 50-200-25 MG PO TABS: 1.0000 | ORAL_TABLET | Freq: Every day | ORAL | 11 refills | Status: DC

## 2018-10-13 MED ORDER — DAPSONE 100 MG PO TABS: 100.0000 mg | ORAL_TABLET | Freq: Every day | ORAL | 5 refills | Status: DC

## 2018-10-13 MED ORDER — CLINDAMYCIN HCL 300 MG PO CAPS: 600.0000 mg | ORAL_CAPSULE | Freq: Three times a day (TID) | ORAL | 5 refills | Status: DC

## 2018-10-13 MED ORDER — PYRIMETHAMINE 25 MG PO TABS: 50.0000 mg | ORAL_TABLET | Freq: Every day | ORAL | 5 refills | Status: DC

## 2018-10-13 NOTE — Progress Notes (Signed)
Subjective:   Chief complaint: Follow-up for HIV disease and toxoplasmosis still with redness in left eye   Patient ID: [name], male    DOB: [birth year], 28 y.o.   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  He was hospitalized on November 27 and initially treated with IV Bactrim.  His lumbar puncture revealed toxoplasma by PCR.  He developed intense pruritus on Bactrim which then also occurred when we challenge him with sulfadiazine with pyrimethamine.  Ultimately we changed him over to clindamycin with pyrimethamine and leucovorin and he did well on this regimen.  We started him on Biktarvy while he was in the hospital and also placed him on dapsone for PCP prophylaxis.  He had a protracted stay in the hospital including time in inpatient rehab.  He is currently receiving physical therapy at least twice a week via advanced home care.  I saw him in the clinic roughly a month ago accompanied by his father and sister and his brother-in-law.  He is a very supportive family the been helping him with 24-hour supervision of his care.  He was approved for the HIV medication assistance program.    He was able to obtain Daraprim along with continued clindamycin to treat his toxoplasmosis.  He has been on dapsone for PCP prophylaxis.  He has dropped his viral load from 699,000 down to a few 100 copies though his most recent viral load went back up to the 200s.  CD4 count has jumped from 10-100.  He has been followed very closely by Dr. Tessa Lerner with rehab but now no longer can get any more treatment due to the fact that he has no insurance.  It looks like he has applied for Medicaid as an immigrant I believe.  His vision continues to improve though he is still unsteady with his gait.  He is oriented to being in the state of [place] but could not tell me what city he is  in.  He continues to have some purplish discoloration of his feet right worse than left though some of these skin lesions are no longer itching as they were before he simply has some purplish areas in his tongue as seen before.  These seem stable.  Past Medical History:  Diagnosis Date  . Anemia    Thomas Park 08/10/2018  . HIV (human immunodeficiency virus infection) ([place])    dx'd 07/2018  . 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 Neuropathy 09/21/2018  . 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[place]) 09/21/2018  . Right sided weakness 09/21/2018  . Tongue lesion 09/21/2018  . 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 NO PAST SURGERIES      No family history on file.    Social History   Socioeconomic History  . Marital status: Single    Spouse name: Not on file  . Number of children: Not on file  . Years of education: Not on file  . Highest education level: Not on file  Occupational History  . Not on file  Social Needs  . Financial resource strain: Not on file  . Food insecurity:    Worry: Not on file    Inability: Not on file  . Transportation needs:    Medical: Not on file    Non-medical: Not  on file  Tobacco Use  . Smoking status: Never Smoker  . Smokeless tobacco: Never Used  Substance and Sexual Activity  . Alcohol use: Yes    Alcohol/week: 6.0 standard drinks    Types: 6 Cans of beer per week    Comment: 08/10/2018 "drink only on weekend"  . Drug use: Not Currently    Comment: 08/10/2018 "have tried coke; been a long time since I've usd any"  . Sexual activity: Not Currently  Lifestyle  . Physical activity:    Days per week: Not on file    Minutes per session: Not on file  . Stress: Not on file  Relationships  . Social connections:    Talks on phone: Not on file    Gets together: Not on file    Attends religious service: Not on file    Active member of  club or organization: Not on file    Attends meetings of clubs or organizations: Not on file    Relationship status: Not on file  Other Topics Concern  . Not on file  Social History Narrative  . Not on file    Allergies  Allergen Reactions  . Sulfa Antibiotics Rash     Current Outpatient Medications:  .  acetaminophen (TYLENOL) 325 MG tablet, Take 2 tablets (650 mg total) by mouth every 6 (six) hours as needed for mild pain (or Fever >/= 101)., Disp: , Rfl:  .  bictegravir-emtricitabine-tenofovir AF (BIKTARVY) 50-200-25 MG TABS tablet, Take 1 tablet by mouth daily., Disp: 30 tablet, Rfl: 11 .  clindamycin (CLEOCIN) 300 MG capsule, Take 2 capsules (600 mg total) by mouth every 6 (six) hours., Disp: 240 capsule, Rfl: 5 .  dapsone 25 MG tablet, Take 2 tablets (50 mg total) by mouth daily., Disp: 30 tablet, Rfl: 11 .  gabapentin (NEURONTIN) 100 MG capsule, Take 1 capsule (100 mg total) by mouth 2 (two) times daily between meals as needed., Disp: 60 capsule, Rfl: 1 .  leucovorin (WELLCOVORIN) 25 MG tablet, Take 1 tablet (25 mg total) by mouth daily., Disp: 30 tablet, Rfl: 5 .  ondansetron (ZOFRAN) 4 MG tablet, Take 4 mg by mouth every 8 (eight) hours as needed for nausea or vomiting., Disp: , Rfl:  .  pyrimethamine (DARAPRIM) 25 MG tablet, Take 3 tablets (75 mg total) by mouth daily with breakfast., Disp: 90 tablet, Rfl: 4    Review of Systems  Unable to perform ROS: Other       Objective:   Physical Exam Constitutional:      General: He is not in acute distress.    Appearance: Normal appearance. He is well-developed. He is not ill-appearing or diaphoretic.  HENT:     Head: Normocephalic and atraumatic.     Right Ear: Hearing and external ear normal.     Left Ear: Hearing and external ear normal.     Nose: No nasal deformity or rhinorrhea.  Eyes:     General: No scleral icterus.    Conjunctiva/sclera:     Right eye: Right conjunctiva is injected.     Left eye: Left  conjunctiva is injected.  Neck:     Musculoskeletal: Normal range of motion and neck supple.     Vascular: No JVD.  Cardiovascular:     Rate and Rhythm: Normal rate and regular rhythm.     Heart sounds: S1 normal and S2 normal.  Pulmonary:     Effort: Pulmonary effort is normal. No respiratory distress.  Abdominal:  Tenderness: There is no abdominal tenderness.  Musculoskeletal: Normal range of motion.     Right shoulder: Normal.     Left shoulder: Normal.     Right hip: Normal.     Left hip: Normal.     Right knee: Normal.     Left knee: Normal.  Lymphadenopathy:     Head:     Right side of head: No submandibular, preauricular or posterior auricular adenopathy.     Left side of head: No submandibular, preauricular or posterior auricular adenopathy.     Cervical:     Right cervical: No superficial or deep cervical adenopathy.    Left cervical: No superficial or deep cervical adenopathy.  Skin:    General: Skin is warm and dry.     Coloration: Skin is not pale.     Findings: No abrasion, bruising, ecchymosis, erythema, lesion or rash.     Nails: There is no clubbing.   Neurological:     Mental Status: He is alert. He is disoriented and confused.     Cranial Nerves: Cranial nerve deficit present.     Sensory: No sensory deficit.     Motor: Weakness present.     Coordination: Coordination abnormal.  Psychiatric:        Attention and Perception: He is attentive.        Speech: Speech normal.        Behavior: Behavior normal. Behavior is cooperative.    Right foot: September 21, 2018    Right foot October 13, 2018:       Left foot January eighth 2020:    Left posterior heel January 2020:    Left posterior heel January 2020:      Tongue September 21, 2018:     Tongue October 13, 2018:         Assessment & Plan:   CNS toxoplasmosis: That he has had at least 2 months of therapy will drop the dose of clindamycin to 600 mg every 8 hours and the  dose of Daraprim down from 75 mg to 50 mg with continued leucovorin maintenance or secondary prophylaxis We will see [facility] with the rehab center to see if there is some way that he could continue to receive care there.    HIV with AIDS: Continue Biktarvy  and recheck a viral load today continue dapsone for PCP prevention  Purplish discoloration of his feet with the one lesion on the left posterior calf.  Stated before these could be potentially Kaposi's though there are no other lesions like them he has good pulses I do not think this is due to vascular disease and not anxious to stand off vascular studies that might be expensive for the patient.  We will continue observe this.  Tongue lesions: Not clear what these are either continue to observe them.    I spent greater than 40 minutes with the patient including greater than 50% of time in face to face counsel of the patient his sister and his father using in person Spanish translator to review all of his pertinent labs to review all of the pertinent medication changes for his CNS toxoplasmosis treatment, including writing these out in Spanish after being translated and in coordination of his care.

## 2018-10-13 NOTE — Patient Instructions (Signed)
Estos son los cambios en sus medicamentos:  Clindamicin 300mg :  tome DOS tabletas TRES veces al da (cada 8 horas)  Pirametamina 25 mg:  tome DOS tabletas UNA VEZ al da  Dapsone 100mg  toma UNA tableta Dollene Cleveland al da  Biktarvy TOMA UNDA tableta cada dia  Zofran TOMA una talbeta cada ocho horas si tiene nausea

## 2018-10-17 ENCOUNTER — Encounter: Payer: Self-pay | Admitting: Infectious Disease

## 2018-10-20 LAB — HIV RNA, RTPCR W/R GT (RTI, PI,INT)
HIV 1 RNA Quant: 260 copies/mL — ABNORMAL HIGH
HIV-1 RNA Quant, Log: 2.41 Log copies/mL — ABNORMAL HIGH

## 2018-10-24 ENCOUNTER — Ambulatory Visit: Payer: Self-pay | Admitting: Speech Pathology

## 2018-10-24 ENCOUNTER — Ambulatory Visit: Payer: Self-pay | Admitting: Physical Therapy

## 2018-10-28 ENCOUNTER — Ambulatory Visit: Payer: Self-pay | Admitting: Physical Therapy

## 2018-10-28 ENCOUNTER — Ambulatory Visit: Payer: Self-pay | Attending: Physical Medicine & Rehabilitation

## 2018-10-31 ENCOUNTER — Ambulatory Visit: Payer: Self-pay | Admitting: Speech Pathology

## 2018-10-31 ENCOUNTER — Ambulatory Visit: Payer: Self-pay | Admitting: Physical Therapy

## 2018-11-04 ENCOUNTER — Ambulatory Visit: Payer: Self-pay | Admitting: Physical Therapy

## 2018-11-07 ENCOUNTER — Ambulatory Visit: Payer: Self-pay | Admitting: Physical Therapy

## 2018-11-07 ENCOUNTER — Encounter: Payer: Self-pay | Admitting: Speech Pathology

## 2018-11-08 ENCOUNTER — Encounter: Payer: Self-pay | Admitting: Infectious Disease

## 2018-11-08 ENCOUNTER — Ambulatory Visit (INDEPENDENT_AMBULATORY_CARE_PROVIDER_SITE_OTHER): Payer: Self-pay | Admitting: Infectious Disease

## 2018-11-08 VITALS — BP 133/80 | HR 85 | Temp 98.6°F

## 2018-11-08 DIAGNOSIS — G939 Disorder of brain, unspecified: Secondary | ICD-10-CM

## 2018-11-08 DIAGNOSIS — K148 Other diseases of tongue: Secondary | ICD-10-CM

## 2018-11-08 DIAGNOSIS — R531 Weakness: Secondary | ICD-10-CM

## 2018-11-08 DIAGNOSIS — B582 Toxoplasma meningoencephalitis: Secondary | ICD-10-CM

## 2018-11-08 DIAGNOSIS — B20 Human immunodeficiency virus [HIV] disease: Secondary | ICD-10-CM

## 2018-11-08 DIAGNOSIS — Q821 Xeroderma pigmentosum: Secondary | ICD-10-CM

## 2018-11-08 DIAGNOSIS — Z23 Encounter for immunization: Secondary | ICD-10-CM

## 2018-11-08 DIAGNOSIS — D893 Immune reconstitution syndrome: Secondary | ICD-10-CM

## 2018-11-08 NOTE — Progress Notes (Signed)
Subjective:   Chief complaint: Follow-up for HIV disease and toxoplasmosis   Patient ID: [place], male    DOB: 09-30-1990, 28 y.o.   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  He was hospitalized on November 27 and initially treated with IV Bactrim.  His lumbar puncture revealed toxoplasma by PCR.  He developed intense pruritus on Bactrim which then also occurred when we challenge him with sulfadiazine with pyrimethamine.  Ultimately we changed him over to clindamycin with pyrimethamine and leucovorin and he did well on this regimen.  We started him on Biktarvy while he was in the hospital and also placed him on dapsone for PCP prophylaxis.  He had a protracted stay in the hospital including time in inpatient rehab.  He is currently receiving physical therapy at least twice a week via advanced home care.  I saw him in the clinic roughly a month ago accompanied by his father and sister and his brother-in-law.  He is a very supportive family the been helping him with 24-hour supervision of his care.  He was approved for the HIV medication assistance program.    He was able to obtain Daraprim along with continued clindamycin to treat his toxoplasmosis.  He has been on dapsone for PCP prophylaxis.  He has dropped his viral load from 699,000 down to a few 100 copies though his most recent viral load went back up to the 200s.  CD4 count has jumped from 10-100.  He has been followed very closely by Dr. Tessa Lerner with rehab but now no longer can get any more treatment due to the fact that he has no insurance.  It looks like he has applied for Medicaid as an immigrant I believe.  He continues to improve and today is aware of the fact that he is in [place] though he thinks he is in a pharmacy he could not give me the month but he could give me the year.  He was able to tie his  shoes while was in the exam with him  I am bothered however by the purplish lesions on his tongue and concerned these could represent visceral Kaposi's sarcoma.  Would like to refer him to ear nose and throat for a biopsy of his tongue.      Past Medical History:  Diagnosis Date  . Anemia    [name] 08/10/2018  . HIV (human immunodeficiency virus infection) ([place])    dx'd 07/2018  . 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 Neuropathy 09/21/2018  . 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[place]) 09/21/2018  . Right sided weakness 09/21/2018  . Tongue lesion 09/21/2018  . 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 NO PAST SURGERIES      No family history on file.    Social History   Socioeconomic History  . Marital status: Single    Spouse name: Not on file  . Number of children: Not on file  . Years of education: Not on file  . Highest education level: Not on file  Occupational History  . Not on file  Social Needs  . Financial resource strain: Not on file  . Food insecurity:    Worry: Not on file    Inability: Not on file  . Transportation needs:    Medical:  Not on file    Non-medical: Not on file  Tobacco Use  . Smoking status: Never Smoker  . Smokeless tobacco: Never Used  Substance and Sexual Activity  . Alcohol use: Yes    Alcohol/week: 6.0 standard drinks    Types: 6 Cans of beer per week    Comment: 08/10/2018 "drink only on weekend"  . Drug use: Not Currently    Comment: 08/10/2018 "have tried coke; been a long time since I've usd any"  . Sexual activity: Not Currently  Lifestyle  . Physical activity:    Days per week: Not on file    Minutes per session: Not on file  . Stress: Not on file  Relationships  . Social connections:    Talks on phone: Not on file    Gets together: Not on file    Attends religious service: Not on file    Active member of  club or organization: Not on file    Attends meetings of clubs or organizations: Not on file    Relationship status: Not on file  Other Topics Concern  . Not on file  Social History Narrative  . Not on file    Allergies  Allergen Reactions  . Sulfa Antibiotics Rash     Current Outpatient Medications:  .  acetaminophen (TYLENOL) 325 MG tablet, Take 2 tablets (650 mg total) by mouth every 6 (six) hours as needed for mild pain (or Fever >/= 101)., Disp: , Rfl:  .  bictegravir-emtricitabine-tenofovir AF (BIKTARVY) 50-200-25 MG TABS tablet, Take 1 tablet by mouth daily., Disp: 30 tablet, Rfl: 11 .  clindamycin (CLEOCIN) 300 MG capsule, Take 2 capsules (600 mg total) by mouth 3 (three) times daily., Disp: 240 capsule, Rfl: 5 .  dapsone 100 MG tablet, Take 1 tablet (100 mg total) by mouth daily., Disp: 30 tablet, Rfl: 5 .  gabapentin (NEURONTIN) 100 MG capsule, Take 1 capsule (100 mg total) by mouth 2 (two) times daily between meals as needed., Disp: 60 capsule, Rfl: 1 .  leucovorin (WELLCOVORIN) 25 MG tablet, Take 1 tablet (25 mg total) by mouth daily., Disp: 30 tablet, Rfl: 5 .  ondansetron (ZOFRAN) 4 MG tablet, Take 1 tablet (4 mg total) by mouth every 8 (eight) hours as needed for nausea or vomiting., Disp: 90 tablet, Rfl: 2 .  pyrimethamine (DARAPRIM) 25 MG tablet, Take 2 tablets (50 mg total) by mouth daily with breakfast., Disp: 60 tablet, Rfl: 5    Review of Systems  Unable to perform ROS: Intubated       Objective:   Physical Exam Constitutional:      General: He is not in acute distress.    Appearance: Normal appearance. He is well-developed. He is not ill-appearing or diaphoretic.  HENT:     Head: Normocephalic and atraumatic.     Right Ear: Hearing and external ear normal.     Left Ear: Hearing and external ear normal.     Nose: No nasal deformity or rhinorrhea.  Eyes:     General: No scleral icterus.    Conjunctiva/sclera:     Right eye: Right conjunctiva is  injected.     Left eye: Left conjunctiva is injected.  Neck:     Musculoskeletal: Normal range of motion and neck supple.     Vascular: No JVD.  Cardiovascular:     Rate and Rhythm: Normal rate and regular rhythm.     Heart sounds: S1 normal and S2 normal.  Pulmonary:  Effort: Pulmonary effort is normal. No respiratory distress.  Abdominal:     Tenderness: There is no abdominal tenderness.  Musculoskeletal: Normal range of motion.     Right shoulder: Normal.     Left shoulder: Normal.     Right hip: Normal.     Left hip: Normal.     Right knee: Normal.     Left knee: Normal.  Lymphadenopathy:     Head:     Right side of head: No submandibular, preauricular or posterior auricular adenopathy.     Left side of head: No submandibular, preauricular or posterior auricular adenopathy.     Cervical:     Right cervical: No superficial or deep cervical adenopathy.    Left cervical: No superficial or deep cervical adenopathy.  Skin:    General: Skin is warm and dry.     Coloration: Skin is not pale.     Findings: No abrasion, bruising, ecchymosis, erythema, lesion or rash.     Nails: There is no clubbing.   Neurological:     Mental Status: He is alert. He is disoriented and confused.     Cranial Nerves: Cranial nerve deficit present.     Sensory: No sensory deficit.     Motor: Weakness present.     Coordination: Coordination abnormal.  Psychiatric:        Attention and Perception: He is attentive.        Speech: Speech normal.        Behavior: Behavior is cooperative.    Right foot: September 21, 2018    Right foot October 13, 2018:     Right foot November 08, 2018:     Left foot January eighth 2020:    Left posterior heel January 2020:    Left posterior heel January 2020:     Posterior ankle the 25th 2020:     Tongue September 21, 2018:     Tongue October 13, 2018:    Tongue fiber 25th 2020:         Assessment & Plan:   CNS  toxoplasmosis: Continue on maintenance therapy   HIV with AIDS: Continue Biktarvy I have anxiety the his missing medications given that he has not suppressed his virus to undetectable levels and it remains in the 200 range.  His sister is adamant that he is not missing any doses.  We will recheck labs today  Purplish discoloration of his feet with the one lesion on the left posterior calf.  Stated before these could be potentially Kaposi's :   Tongue lesions: The better thing to do is to get a biopsy of his tongue to see if this might be visceral Kaposi's.  Biopsying the skin will not prove that the tongue is Kaposi's and would not change our management but if he has visceral Kaposi's will need chemotherapy.   I spent greater than 40 minutes with the patient including greater than 50% of time in face to face counsel of the patient in [place] translator regarding nature of his HIV disease treatment of this is CNS toxoplasmosis, his potential Kaposi's sarcoma insisting him also to pertain assistance to be able to see ear nose and throat and in coordination of his care. [name] Kitchen

## 2018-11-08 NOTE — Addendum Note (Signed)
Addended by: Reggy Eye on: 11/08/2018 05:29 PM   Modules accepted: Orders

## 2018-11-09 LAB — TIQ-NTM

## 2018-11-09 LAB — T-HELPER CELL (CD4) - (RCID CLINIC ONLY)
CD4 % Helper T Cell: 6 % — ABNORMAL LOW (ref 33–55)
CD4 T Cell Abs: 110 /uL — ABNORMAL LOW (ref 400–2700)

## 2018-11-10 LAB — TIQ-MISC

## 2018-11-16 ENCOUNTER — Ambulatory Visit: Payer: Self-pay | Admitting: Physical Therapy

## 2018-11-16 ENCOUNTER — Ambulatory Visit: Payer: Self-pay

## 2018-11-16 LAB — COMPLETE METABOLIC PANEL WITH GFR
AG Ratio: 1.3 (calc) (ref 1.0–2.5)
ALT: 25 U/L (ref 9–46)
AST: 22 U/L (ref 10–40)
Albumin: 4.4 g/dL (ref 3.6–5.1)
Alkaline phosphatase (APISO): 105 U/L (ref 36–130)
BUN: 12 mg/dL (ref 7–25)
CO2: 25 mmol/L (ref 20–32)
Calcium: 9.6 mg/dL (ref 8.6–10.3)
Chloride: 102 mmol/L (ref 98–110)
Creat: 0.95 mg/dL (ref 0.60–1.35)
GFR, Est African American: 126 mL/min/{1.73_m2} (ref >=60)
GFR, Est Non African American: 108 mL/min/{1.73_m2} (ref >=60)
Globulin: 3.5 g/dL (calc) (ref 1.9–3.7)
Glucose, Bld: 100 mg/dL — ABNORMAL HIGH (ref 65–99)
Potassium: 4.3 mmol/L (ref 3.5–5.3)
SODIUM: 136 mmol/L (ref 135–146)
Total Bilirubin: 0.6 mg/dL (ref 0.2–1.2)
Total Protein: 7.9 g/dL (ref 6.1–8.1)

## 2018-11-16 LAB — CBC WITH DIFFERENTIAL/PLATELET
Absolute Monocytes: 574 cells/uL (ref 200–950)
Basophils Absolute: 58 cells/uL (ref 0–200)
Basophils Relative: 1 %
Eosinophils Absolute: 760 cells/uL — ABNORMAL HIGH (ref 15–500)
Eosinophils Relative: 13.1 %
HCT: 40.9 % (ref 38.5–50.0)
Hemoglobin: 13.3 g/dL (ref 13.2–17.1)
Lymphs Abs: 1583 cells/uL (ref 850–3900)
MCH: 31.3 pg (ref 27.0–33.0)
MCHC: 32.5 g/dL (ref 32.0–36.0)
MCV: 96.2 fL (ref 80.0–100.0)
MPV: 9.3 fL (ref 7.5–12.5)
Monocytes Relative: 9.9 %
Neutro Abs: 2825 cells/uL (ref 1500–7800)
Neutrophils Relative %: 48.7 %
PLATELETS: 408 10*3/uL — AB (ref 140–400)
RBC: 4.25 10*6/uL (ref 4.20–5.80)
RDW: 12 % (ref 11.0–15.0)
Total Lymphocyte: 27.3 %
WBC: 5.8 10*3/uL (ref 3.8–10.8)

## 2018-11-16 LAB — HIV RNA, RTPCR W/R GT (RTI, PI,INT)
HIV 1 RNA QUANT: 142 {copies}/mL — AB
HIV-1 RNA Quant, Log: 2.15 Log copies/mL — ABNORMAL HIGH

## 2018-11-18 ENCOUNTER — Ambulatory Visit: Payer: Self-pay | Admitting: Physical Therapy

## 2018-11-18 ENCOUNTER — Ambulatory Visit: Payer: Self-pay

## 2018-11-23 ENCOUNTER — Ambulatory Visit: Payer: Self-pay

## 2018-11-23 ENCOUNTER — Ambulatory Visit: Payer: Self-pay | Admitting: Physical Therapy

## 2018-11-25 ENCOUNTER — Ambulatory Visit: Payer: Self-pay | Admitting: Physical Therapy

## 2018-11-28 ENCOUNTER — Ambulatory Visit: Payer: Self-pay | Admitting: Physical Therapy

## 2018-11-28 ENCOUNTER — Encounter: Payer: Self-pay | Admitting: Speech Pathology

## 2018-11-28 ENCOUNTER — Encounter: Payer: Self-pay | Attending: Physical Medicine & Rehabilitation | Admitting: Physical Medicine & Rehabilitation

## 2018-11-28 DIAGNOSIS — B582 Toxoplasma meningoencephalitis: Secondary | ICD-10-CM | POA: Insufficient documentation

## 2018-11-28 DIAGNOSIS — R531 Weakness: Secondary | ICD-10-CM | POA: Insufficient documentation

## 2018-12-02 ENCOUNTER — Ambulatory Visit: Payer: Self-pay | Admitting: Physical Therapy

## 2018-12-13 ENCOUNTER — Other Ambulatory Visit: Payer: Self-pay

## 2018-12-13 ENCOUNTER — Ambulatory Visit (INDEPENDENT_AMBULATORY_CARE_PROVIDER_SITE_OTHER): Payer: Self-pay | Admitting: Infectious Disease

## 2018-12-13 ENCOUNTER — Encounter: Payer: Self-pay | Admitting: Infectious Disease

## 2018-12-13 VITALS — BP 129/81 | HR 85 | Temp 97.8°F | Wt 176.0 lb

## 2018-12-13 DIAGNOSIS — G629 Polyneuropathy, unspecified: Secondary | ICD-10-CM

## 2018-12-13 DIAGNOSIS — R269 Unspecified abnormalities of gait and mobility: Secondary | ICD-10-CM

## 2018-12-13 DIAGNOSIS — B20 Human immunodeficiency virus [HIV] disease: Secondary | ICD-10-CM

## 2018-12-13 DIAGNOSIS — R531 Weakness: Secondary | ICD-10-CM

## 2018-12-13 DIAGNOSIS — I75021 Atheroembolism of right lower extremity: Secondary | ICD-10-CM

## 2018-12-13 DIAGNOSIS — D893 Immune reconstitution syndrome: Secondary | ICD-10-CM

## 2018-12-13 DIAGNOSIS — Z23 Encounter for immunization: Secondary | ICD-10-CM

## 2018-12-13 DIAGNOSIS — Q821 Xeroderma pigmentosum: Secondary | ICD-10-CM

## 2018-12-13 DIAGNOSIS — B582 Toxoplasma meningoencephalitis: Secondary | ICD-10-CM

## 2018-12-13 DIAGNOSIS — K148 Other diseases of tongue: Secondary | ICD-10-CM

## 2018-12-13 DIAGNOSIS — G939 Disorder of brain, unspecified: Secondary | ICD-10-CM

## 2018-12-13 NOTE — Progress Notes (Signed)
Subjective:   Chief complaint: Follow-up for HIV disease and toxoplasmosis   Patient ID: Thomas Park, male    DOB: 1991/08/19, 28 y.o.   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  He was hospitalized on November 27 and initially treated with IV Bactrim.  His lumbar puncture revealed toxoplasma by PCR.  He developed intense pruritus on Bactrim which then also occurred when we challenge him with sulfadiazine with pyrimethamine.  Ultimately we changed him over to clindamycin with pyrimethamine and leucovorin and he did well on this regimen.  We started him on Biktarvy while he was in the hospital and also placed him on dapsone for PCP prophylaxis.  He had a protracted stay in the hospital including time in inpatient rehab.  He is currently receiving physical therapy at least twice a week via advanced home care.  I saw him in the clinic roughly a month ago accompanied by his father and sister and his brother-in-law.  He is a very supportive family the been helping him with 24-hour supervision of his care.  He was approved for the HIV medication assistance program.    He was able to obtain Daraprim along with continued clindamycin to treat his toxoplasmosis.  He has been on dapsone for PCP prophylaxis.  He has dropped his viral load from 699,000 down to a few 100 copies though his most recent viral load went back up to the 200s.  CD4 count has jumped from 10-100.  He has been followed very closely by Dr. Tessa Lerner with rehab but now no longer can get any more treatment due to the fact that he has no insurance.  It looks like he has applied for Medicaid as an immigrant I believe.  He continues to improve and today is aware of the fact that he is in [place] though he thinks he is in a pharmacy he could not give me the month but he could give me the year.   I was bothered however  by the purplish lesions on his tongue and concerned these could represent visceral Kaposi's sarcoma.  I had wanted to refer him to ear nose and throat for a biopsy of his tongue.   However this did not happen.   He is however IMPROVING across the board including his cutaneous and tongue leaions.  He is able to walk without trouble. His vision is better.  He lives with one sister who has children but they are sheltering in place.  Otherwise he is only visited by sister who brought him to clinic today and his father more rarely now.    Past Medical History:  Diagnosis Date  . Anemia    [name] 08/10/2018  . HIV (human immunodeficiency virus infection) ([place])    dx'd 07/2018  . 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 Neuropathy 09/21/2018  . 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[place]) 09/21/2018  . Right sided weakness 09/21/2018  . Tongue lesion 09/21/2018  . 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 NO PAST SURGERIES      No family history on file.    Social History   Socioeconomic History  . Marital status: Single    Spouse name: Not on file  . Number of children: Not on file  . Years of education: Not on  file  . Highest education level: Not on file  Occupational History  . Not on file  Social Needs  . Financial resource strain: Not on file  . Food insecurity:    Worry: Not on file    Inability: Not on file  . Transportation needs:    Medical: Not on file    Non-medical: Not on file  Tobacco Use  . Smoking status: Never Smoker  . Smokeless tobacco: Never Used  Substance and Sexual Activity  . Alcohol use: Yes    Alcohol/week: 6.0 standard drinks    Types: 6 Cans of beer per week    Comment: 08/10/2018 "drink only on weekend"  . Drug use: Not Currently    Comment: 08/10/2018 "have tried coke; been a long time since I've usd any"  . Sexual activity: Not  Currently  Lifestyle  . Physical activity:    Days per week: Not on file    Minutes per session: Not on file  . Stress: Not on file  Relationships  . Social connections:    Talks on phone: Not on file    Gets together: Not on file    Attends religious service: Not on file    Active member of club or organization: Not on file    Attends meetings of clubs or organizations: Not on file    Relationship status: Not on file  Other Topics Concern  . Not on file  Social History Narrative  . Not on file    Allergies  Allergen Reactions  . Sulfa Antibiotics Rash     Current Outpatient Medications:  .  acetaminophen (TYLENOL) 325 MG tablet, Take 2 tablets (650 mg total) by mouth every 6 (six) hours as needed for mild pain (or Fever >/= 101)., Disp: , Rfl:  .  bictegravir-emtricitabine-tenofovir AF (BIKTARVY) 50-200-25 MG TABS tablet, Take 1 tablet by mouth daily., Disp: 30 tablet, Rfl: 11 .  clindamycin (CLEOCIN) 300 MG capsule, Take 2 capsules (600 mg total) by mouth 3 (three) times daily., Disp: 240 capsule, Rfl: 5 .  dapsone 100 MG tablet, Take 1 tablet (100 mg total) by mouth daily., Disp: 30 tablet, Rfl: 5 .  gabapentin (NEURONTIN) 100 MG capsule, Take 1 capsule (100 mg total) by mouth 2 (two) times daily between meals as needed., Disp: 60 capsule, Rfl: 1 .  leucovorin (WELLCOVORIN) 25 MG tablet, Take 1 tablet (25 mg total) by mouth daily., Disp: 30 tablet, Rfl: 5 .  ondansetron (ZOFRAN) 4 MG tablet, Take 1 tablet (4 mg total) by mouth every 8 (eight) hours as needed for nausea or vomiting., Disp: 90 tablet, Rfl: 2 .  pyrimethamine (DARAPRIM) 25 MG tablet, Take 2 tablets (50 mg total) by mouth daily with breakfast., Disp: 60 tablet, Rfl: 5    Review of Systems  Unable to perform ROS: Dementia       Objective:   Physical Exam Constitutional:      General: He is not in acute distress.    Appearance: Normal appearance. He is well-developed. He is not ill-appearing or  diaphoretic.  HENT:     Head: Normocephalic and atraumatic.     Right Ear: Hearing and external ear normal.     Left Ear: Hearing and external ear normal.     Nose: No nasal deformity or rhinorrhea.  Eyes:     General: No scleral icterus.    Conjunctiva/sclera:     Right eye: Right conjunctiva is not injected.     Left eye: Left  conjunctiva is not injected.  Neck:     Musculoskeletal: Normal range of motion and neck supple.     Vascular: No JVD.  Cardiovascular:     Rate and Rhythm: Normal rate and regular rhythm.     Heart sounds: S1 normal and S2 normal.  Pulmonary:     Effort: Pulmonary effort is normal. No respiratory distress.  Abdominal:     Tenderness: There is no abdominal tenderness.  Musculoskeletal: Normal range of motion.     Right shoulder: Normal.     Left shoulder: Normal.     Right hip: Normal.     Left hip: Normal.     Right knee: Normal.     Left knee: Normal.  Lymphadenopathy:     Head:     Right side of head: No submandibular, preauricular or posterior auricular adenopathy.     Left side of head: No submandibular, preauricular or posterior auricular adenopathy.     Cervical:     Right cervical: No superficial or deep cervical adenopathy.    Left cervical: No superficial or deep cervical adenopathy.  Skin:    General: Skin is warm and dry.     Coloration: Skin is not pale.     Findings: No abrasion, bruising, ecchymosis, erythema, lesion or rash.     Nails: There is no clubbing.   Neurological:     Mental Status: He is alert. He is disoriented and confused.     Cranial Nerves: Cranial nerve deficit present.     Sensory: No sensory deficit.     Motor: Weakness present.     Coordination: Coordination abnormal.  Psychiatric:        Attention and Perception: He is attentive.        Mood and Affect: Mood normal.        Speech: Speech normal.        Behavior: Behavior is cooperative.        Cognition and Memory: Cognition is impaired.    Right foot:  September 21, 2018    Right foot October 13, 2018:     Right foot November 08, 2018:    Right foot 12/13/2018:      Left foot January eighth 2020:    Left posterior heel January 2020:    Left posterior heel January 2020:     Posterior ankle the 25th 2020:    December 13, 2018:      Tongue September 21, 2018:     Tongue October 13, 2018:    Tongue fiber 25th 2020:     December 13, 2018:        Assessment & Plan:   CNS toxoplasmosis: Continue on maintenance therapy   HIV with AIDS: Continue Biktarvy IWe will recheck labs today  Purplish discoloration of his feet with the one lesion on the left posterior calf.  Stated before these could be potentially Kaposi's  But is getting better   Tongue lesions: Getting better so will not proceed with biopsy referral at present.  I spent greater than 25 minutes with the patient including greater than 50% of time in face to face counsel of the patient and his sister on who to take current medications correctly, how to shelter in place in context of [place] 2019 and on signs and symptoms of this illness, and potential ACTG trials in this arena and in coordination of his care using electronic ipad based Patent attorney .

## 2018-12-14 LAB — T-HELPER CELL (CD4) - (RCID CLINIC ONLY)
CD4 T CELL HELPER: 7 % — AB (ref 33–55)
CD4 T Cell Abs: 120 /uL — ABNORMAL LOW (ref 400–2700)

## 2018-12-14 LAB — URINE CYTOLOGY ANCILLARY ONLY
CHLAMYDIA, DNA PROBE: NEGATIVE
Neisseria Gonorrhea: NEGATIVE

## 2018-12-22 LAB — CBC WITH DIFFERENTIAL/PLATELET
Absolute Monocytes: 596 cells/uL (ref 200–950)
Basophils Absolute: 71 cells/uL (ref 0–200)
Basophils Relative: 1 %
Eosinophils Absolute: 767 cells/uL — ABNORMAL HIGH (ref 15–500)
Eosinophils Relative: 10.8 %
HCT: 40.5 % (ref 38.5–50.0)
Hemoglobin: 13.2 g/dL (ref 13.2–17.1)
Lymphs Abs: 1953 cells/uL (ref 850–3900)
MCH: 31.7 pg (ref 27.0–33.0)
MCHC: 32.6 g/dL (ref 32.0–36.0)
MCV: 97.1 fL (ref 80.0–100.0)
MPV: 9.6 fL (ref 7.5–12.5)
Monocytes Relative: 8.4 %
Neutro Abs: 3713 cells/uL (ref 1500–7800)
Neutrophils Relative %: 52.3 %
Platelets: 385 10*3/uL (ref 140–400)
RBC: 4.17 10*6/uL — ABNORMAL LOW (ref 4.20–5.80)
RDW: 12.6 % (ref 11.0–15.0)
Total Lymphocyte: 27.5 %
WBC: 7.1 10*3/uL (ref 3.8–10.8)

## 2018-12-22 LAB — COMPLETE METABOLIC PANEL WITH GFR
AG Ratio: 1.2 (calc) (ref 1.0–2.5)
ALKALINE PHOSPHATASE (APISO): 89 U/L (ref 36–130)
ALT: 33 U/L (ref 9–46)
AST: 31 U/L (ref 10–40)
Albumin: 4.5 g/dL (ref 3.6–5.1)
BUN: 7 mg/dL (ref 7–25)
CO2: 26 mmol/L (ref 20–32)
Calcium: 9.5 mg/dL (ref 8.6–10.3)
Chloride: 103 mmol/L (ref 98–110)
Creat: 0.91 mg/dL (ref 0.60–1.35)
GFR, Est African American: 132 mL/min/{1.73_m2} (ref >=60)
GFR, Est Non African American: 114 mL/min/{1.73_m2} (ref >=60)
Globulin: 3.8 g/dL (calc) — ABNORMAL HIGH (ref 1.9–3.7)
Glucose, Bld: 82 mg/dL (ref 65–99)
POTASSIUM: 4.3 mmol/L (ref 3.5–5.3)
Sodium: 137 mmol/L (ref 135–146)
Total Bilirubin: 0.9 mg/dL (ref 0.2–1.2)
Total Protein: 8.3 g/dL — ABNORMAL HIGH (ref 6.1–8.1)

## 2018-12-22 LAB — LIPID PANEL
Cholesterol: 132 mg/dL (ref <200)
HDL: 27 mg/dL — ABNORMAL LOW (ref >=40)
LDL CHOLESTEROL (CALC): 59 mg/dL
Non-HDL Cholesterol (Calc): 105 mg/dL (calc) (ref <130)
Total CHOL/HDL Ratio: 4.9 (calc) (ref <5.0)
Triglycerides: 383 mg/dL — ABNORMAL HIGH (ref <150)

## 2018-12-22 LAB — RPR: RPR: NONREACTIVE

## 2018-12-22 LAB — HIV-1 RNA QUANT-NO REFLEX-BLD
HIV 1 RNA Quant: 90 copies/mL — ABNORMAL HIGH
HIV-1 RNA Quant, Log: 1.95 Log copies/mL — ABNORMAL HIGH

## 2018-12-30 ENCOUNTER — Other Ambulatory Visit: Payer: Self-pay | Admitting: Infectious Disease

## 2019-03-03 ENCOUNTER — Other Ambulatory Visit: Payer: Self-pay | Admitting: Infectious Disease

## 2019-03-23 ENCOUNTER — Other Ambulatory Visit: Payer: Self-pay | Admitting: Infectious Disease

## 2019-03-23 DIAGNOSIS — B20 Human immunodeficiency virus [HIV] disease: Secondary | ICD-10-CM

## 2019-04-21 ENCOUNTER — Other Ambulatory Visit: Payer: Self-pay | Admitting: Infectious Disease

## 2019-05-08 ENCOUNTER — Encounter: Payer: Self-pay | Admitting: Internal Medicine

## 2019-05-08 ENCOUNTER — Ambulatory Visit (INDEPENDENT_AMBULATORY_CARE_PROVIDER_SITE_OTHER): Payer: Self-pay | Admitting: Internal Medicine

## 2019-05-08 ENCOUNTER — Other Ambulatory Visit: Payer: Self-pay

## 2019-05-08 ENCOUNTER — Ambulatory Visit: Payer: Self-pay | Admitting: Infectious Disease

## 2019-05-08 VITALS — BP 137/86 | HR 103

## 2019-05-08 DIAGNOSIS — L731 Pseudofolliculitis barbae: Secondary | ICD-10-CM

## 2019-05-08 DIAGNOSIS — B20 Human immunodeficiency virus [HIV] disease: Secondary | ICD-10-CM

## 2019-05-08 DIAGNOSIS — D7218 Eosinophilia in diseases classified elsewhere: Secondary | ICD-10-CM | POA: Insufficient documentation

## 2019-05-08 DIAGNOSIS — B582 Toxoplasma meningoencephalitis: Secondary | ICD-10-CM

## 2019-05-08 DIAGNOSIS — L738 Other specified follicular disorders: Secondary | ICD-10-CM

## 2019-05-08 NOTE — Progress Notes (Signed)
   Subjective:    Patient ID: [place], male    DOB: [birth year], 28 y.o.   MRN: [id number]  HPI Here for follow up of HIV and CNS toxoplasmosis. He continues on Biktarvy for his HV and Daraprim for his toxo and no new issues.  His last CD4 was 120 and his recent nadir was 10.  He is here with his sister who is his caregiver.  No missed doses and no new concerns.  Also on dapsone for PJP prophylaxis.  No associated diarrhea.  Some areas of pruritic rash on face and torso.  Some hyperpigmentation.       Review of Systems  Constitutional: Negative for fatigue and unexpected weight change.  Gastrointestinal: Negative for diarrhea and nausea.  Skin: Negative for rash.       Objective:   Physical Exam Eyes:     General: No scleral icterus.    Comments: + nystagmus  Cardiovascular:     Rate and Rhythm: Normal rate and regular rhythm.     Pulses: Normal pulses.     Heart sounds: Normal heart sounds. No murmur.  Pulmonary:     Effort: Pulmonary effort is normal.  Neurological:     Mental Status: He is alert.     Comments: He does not speak much   SH: lives with his sister        Assessment & Plan:

## 2019-05-08 NOTE — Assessment & Plan Note (Signed)
Continues on treatment and no changes.

## 2019-05-08 NOTE — Assessment & Plan Note (Signed)
Rash may be OF.  Concern for Kaposi's as well but small areas and itchy.  Discussed treatment is ARVs/improved immune system.

## 2019-05-08 NOTE — Assessment & Plan Note (Signed)
Improving.  Will check his labs today and he can rtc in 4 months.

## 2019-05-09 LAB — T-HELPER CELL (CD4) - (RCID CLINIC ONLY)
CD4 % Helper T Cell: 9 % — ABNORMAL LOW (ref 33–65)
CD4 T Cell Abs: 187 /uL — ABNORMAL LOW (ref 400–1790)

## 2019-05-11 LAB — COMPLETE METABOLIC PANEL WITH GFR
AG Ratio: 1.3 (calc) (ref 1.0–2.5)
ALT: 38 U/L (ref 9–46)
AST: 38 U/L (ref 10–40)
Albumin: 4.4 g/dL (ref 3.6–5.1)
Alkaline phosphatase (APISO): 94 U/L (ref 36–130)
BUN: 12 mg/dL (ref 7–25)
CO2: 24 mmol/L (ref 20–32)
Calcium: 9.5 mg/dL (ref 8.6–10.3)
Chloride: 102 mmol/L (ref 98–110)
Creat: 0.91 mg/dL (ref 0.60–1.35)
GFR, Est African American: 132 mL/min/{1.73_m2} (ref >=60)
GFR, Est Non African American: 113 mL/min/{1.73_m2} (ref >=60)
Globulin: 3.3 g/dL (calc) (ref 1.9–3.7)
Glucose, Bld: 110 mg/dL — ABNORMAL HIGH (ref 65–99)
Potassium: 4 mmol/L (ref 3.5–5.3)
Sodium: 135 mmol/L (ref 135–146)
Total Bilirubin: 1 mg/dL (ref 0.2–1.2)
Total Protein: 7.7 g/dL (ref 6.1–8.1)

## 2019-05-11 LAB — HIV-1 RNA QUANT-NO REFLEX-BLD
HIV 1 RNA Quant: 46 copies/mL — ABNORMAL HIGH
HIV-1 RNA Quant, Log: 1.66 Log copies/mL — ABNORMAL HIGH

## 2019-05-18 ENCOUNTER — Other Ambulatory Visit: Payer: Self-pay | Admitting: Infectious Disease

## 2019-05-24 ENCOUNTER — Other Ambulatory Visit: Payer: Self-pay | Admitting: Infectious Disease

## 2019-06-21 ENCOUNTER — Telehealth: Payer: Self-pay

## 2019-06-21 NOTE — Telephone Encounter (Signed)
Thomas Park, [employer] called office to interpret call for patient. Patient is complaining of nausea/vomitting, "Feeling full", and numbness in right arm. Patient states that the numbness only happened for one day,but has resolved. States that the other symptoms have been going on for five days now. Advised patient to contact PCP or urgent care. Patient verbalized understanding and will follow up with PCP. [name]

## 2019-07-04 ENCOUNTER — Other Ambulatory Visit: Payer: Self-pay | Admitting: Internal Medicine

## 2019-08-31 ENCOUNTER — Other Ambulatory Visit: Payer: Self-pay | Admitting: Infectious Disease

## 2019-08-31 DIAGNOSIS — B20 Human immunodeficiency virus [HIV] disease: Secondary | ICD-10-CM

## 2019-09-25 ENCOUNTER — Ambulatory Visit (INDEPENDENT_AMBULATORY_CARE_PROVIDER_SITE_OTHER): Payer: Self-pay | Admitting: Internal Medicine

## 2019-09-25 ENCOUNTER — Encounter: Payer: Self-pay | Admitting: Internal Medicine

## 2019-09-25 ENCOUNTER — Other Ambulatory Visit: Payer: Self-pay

## 2019-09-25 VITALS — BP 155/103 | HR 110 | Temp 98.9°F | Resp 12 | Wt 197.0 lb

## 2019-09-25 DIAGNOSIS — B20 Human immunodeficiency virus [HIV] disease: Secondary | ICD-10-CM

## 2019-09-25 DIAGNOSIS — B582 Toxoplasma meningoencephalitis: Secondary | ICD-10-CM

## 2019-09-25 DIAGNOSIS — Z23 Encounter for immunization: Secondary | ICD-10-CM

## 2019-09-25 NOTE — Progress Notes (Signed)
   Subjective:    Patient ID: [place], male    DOB: 03-28-1991, 29 y.o.   MRN: [id number]  HPI Here for follow up of HIV Started on [place] last year and has been on Daraprim, clinidamycin and leucovorin for CNS toxo.  Last CD4 was 187 and had a suppressed viral load of just 46 copies.  Taking his medication well and no new issues.  Sister present and gives a history of him spitting up phlegm.  No associated fever.   Some nausea and relieved with zofran     Review of Systems  Gastrointestinal: Positive for nausea.  Skin: Negative for rash.       Objective:   Physical Exam Constitutional:      Appearance: Normal appearance.  Eyes:     General: No scleral icterus. Cardiovascular:     Rate and Rhythm: Normal rate and regular rhythm.     Heart sounds: No murmur.  Pulmonary:     Effort: Pulmonary effort is normal. No respiratory distress.  Neurological:     Mental Status: He is alert.   SH: no tobacco        Assessment & Plan:

## 2019-09-26 LAB — T-HELPER CELL (CD4) - (RCID CLINIC ONLY)
CD4 % Helper T Cell: 10 % — ABNORMAL LOW (ref 33–65)
CD4 T Cell Abs: 262 /uL — ABNORMAL LOW (ref 400–1790)

## 2019-09-30 LAB — COMPLETE METABOLIC PANEL WITH GFR
AG Ratio: 1.3 (calc) (ref 1.0–2.5)
ALT: 36 U/L (ref 9–46)
AST: 30 U/L (ref 10–40)
Albumin: 4.6 g/dL (ref 3.6–5.1)
Alkaline phosphatase (APISO): 105 U/L (ref 36–130)
BUN: 15 mg/dL (ref 7–25)
CO2: 29 mmol/L (ref 20–32)
Calcium: 9.6 mg/dL (ref 8.6–10.3)
Chloride: 101 mmol/L (ref 98–110)
Creat: 0.92 mg/dL (ref 0.60–1.35)
GFR, Est African American: 130 mL/min/{1.73_m2} (ref >=60)
GFR, Est Non African American: 112 mL/min/{1.73_m2} (ref >=60)
Globulin: 3.6 g/dL (calc) (ref 1.9–3.7)
Glucose, Bld: 93 mg/dL (ref 65–99)
Potassium: 4.2 mmol/L (ref 3.5–5.3)
Sodium: 137 mmol/L (ref 135–146)
Total Bilirubin: 0.9 mg/dL (ref 0.2–1.2)
Total Protein: 8.2 g/dL — ABNORMAL HIGH (ref 6.1–8.1)

## 2019-09-30 LAB — HIV-1 RNA QUANT-NO REFLEX-BLD
HIV 1 RNA Quant: 110 copies/mL — ABNORMAL HIGH
HIV-1 RNA Quant, Log: 2.04 Log copies/mL — ABNORMAL HIGH

## 2019-10-31 ENCOUNTER — Other Ambulatory Visit: Payer: Self-pay

## 2019-10-31 ENCOUNTER — Encounter: Payer: Self-pay | Admitting: Infectious Disease

## 2019-10-31 ENCOUNTER — Ambulatory Visit: Payer: Self-pay

## 2019-11-07 ENCOUNTER — Other Ambulatory Visit: Payer: Self-pay

## 2019-11-07 MED ORDER — ONDANSETRON HCL 4 MG PO TABS: ORAL_TABLET | ORAL | 3 refills | Status: DC

## 2019-12-05 ENCOUNTER — Telehealth: Payer: Self-pay | Admitting: *Deleted

## 2019-12-05 NOTE — Telephone Encounter (Signed)
Yes- thanks so much

## 2019-12-05 NOTE — Telephone Encounter (Signed)
Barbette Hair called on patient's behalf asking if it is appropriate for the patient to get his COVID vaccine. RN relayed that it is recommended. Landis Gandy, RN

## 2019-12-22 ENCOUNTER — Other Ambulatory Visit: Payer: Self-pay | Admitting: Infectious Disease

## 2020-01-19 ENCOUNTER — Other Ambulatory Visit: Payer: Self-pay

## 2020-01-19 DIAGNOSIS — B20 Human immunodeficiency virus [HIV] disease: Secondary | ICD-10-CM

## 2020-01-19 MED ORDER — DAPSONE 100 MG PO TABS: 100.0000 mg | ORAL_TABLET | Freq: Every day | ORAL | 1 refills | Status: DC

## 2020-01-30 ENCOUNTER — Ambulatory Visit: Payer: Self-pay | Admitting: Infectious Disease

## 2020-02-06 ENCOUNTER — Encounter: Payer: Self-pay | Admitting: Infectious Disease

## 2020-02-06 ENCOUNTER — Ambulatory Visit (INDEPENDENT_AMBULATORY_CARE_PROVIDER_SITE_OTHER): Payer: Self-pay | Admitting: Infectious Disease

## 2020-02-06 ENCOUNTER — Other Ambulatory Visit: Payer: Self-pay

## 2020-02-06 VITALS — BP 133/88 | HR 92 | Temp 99.1°F | Wt 195.0 lb

## 2020-02-06 DIAGNOSIS — I75021 Atheroembolism of right lower extremity: Secondary | ICD-10-CM

## 2020-02-06 DIAGNOSIS — K148 Other diseases of tongue: Secondary | ICD-10-CM

## 2020-02-06 DIAGNOSIS — B20 Human immunodeficiency virus [HIV] disease: Secondary | ICD-10-CM

## 2020-02-06 DIAGNOSIS — B589 Toxoplasmosis, unspecified: Secondary | ICD-10-CM

## 2020-02-06 DIAGNOSIS — R1310 Dysphagia, unspecified: Secondary | ICD-10-CM | POA: Insufficient documentation

## 2020-02-06 DIAGNOSIS — R1319 Other dysphagia: Secondary | ICD-10-CM

## 2020-02-06 DIAGNOSIS — R269 Unspecified abnormalities of gait and mobility: Secondary | ICD-10-CM

## 2020-02-06 DIAGNOSIS — B582 Toxoplasma meningoencephalitis: Secondary | ICD-10-CM

## 2020-02-06 HISTORY — DX: Dysphagia, unspecified: R13.10

## 2020-02-06 MED ORDER — FLUCONAZOLE 100 MG PO TABS: 100.0000 mg | ORAL_TABLET | Freq: Every day | ORAL | 1 refills | Status: DC

## 2020-02-06 MED ORDER — ONDANSETRON HCL 4 MG PO TABS: ORAL_TABLET | ORAL | 3 refills | Status: DC

## 2020-02-06 NOTE — Progress Notes (Signed)
Subjective:   Chief complaint: Follow-up for HIV disease and toxoplasmosis, having trouble with unsteadiness again and also vomiting bilious material and sometimes bloodstained material  Patient ID: Thomas Park, male    DOB: 1991-07-23, 29 y.o.   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  He was hospitalized on and initially treated with IV Bactrim.  His lumbar puncture revealed toxoplasma by PCR.  He developed intense pruritus on Bactrim which then also occurred when we challenge him with sulfadiazine with pyrimethamine.  Ultimately we changed him over to clindamycin with pyrimethamine and leucovorin and he did well on this regimen.  We started him on Biktarvy while he was in the hospital and also placed him on dapsone for PCP prophylaxis.  He had a protracted stay in the hospital including time in inpatient rehab.  He is currently receiving physical therapy at least twice a week via advanced home care.    I was bothered however by the purplish lesions on his tongue and concerned these could represent visceral Kaposi's sarcoma.  Have not seen him myself since March 2020 though he has been seen by my partner Dr. Linus Salmons in the interim.  He seems to have been improving and certainly to me now is gained a lot more weight than when I saw him last.  His family though accompanies him including his sister says that he is more unsteady on his feet and that he was indeed unsteady when he walks for me today.  Also has been vomiting up material at times and having some dysphagia.  Viral load was reasonably suppressed though imperfectly so and CD4 count was in the upper 200s when last checked in January 2021.       Past Medical History:  Diagnosis Date  . Anemia    [name] 08/10/2018  . HIV (human immunodeficiency virus infection) ([place])    dx'd 07/2018  . IRIS (immune reconstitution  inflammatory syndrome) ([place]) 09/21/2018  . 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 Neuropathy 09/21/2018  . 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[place]) 09/21/2018  . Right sided weakness 09/21/2018  . Tongue lesion 09/21/2018  . 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 NO PAST SURGERIES      No family history on file.    Social History   Socioeconomic History  . Marital status: Single    Spouse name: Not on file  . Number of children: Not on file  . Years of education: Not on file  . Highest education level: Not on file  Occupational History  . Not on file  Tobacco Use  . Smoking status: Never Smoker  . Smokeless tobacco: Never Used  Substance and Sexual Activity  . Alcohol use: Yes    Alcohol/week: 6.0 standard drinks    Types: 6 Cans of beer per week    Comment: 08/10/2018 "drink only on weekend"  . Drug use: Not Currently    Comment: 08/10/2018 "have tried coke; been a long time since I've usd any"  . Sexual activity: Not Currently  Other Topics Concern  . Not on file  Social History Narrative  . Not on file   Social Determinants of Health   Financial Resource Strain:   . Difficulty of Paying Living Expenses:   Food Insecurity:   . Worried About Charity fundraiser in the Last Year:   .  Ran Out of Food in the Last Year:   Transportation Needs:   . Film/video editor (Medical):   [name] Kitchen Lack of Transportation (Non-Medical):   Physical Activity:   . Days of Exercise per Week:   . Minutes of Exercise per Session:   Stress:   . Feeling of Stress :   Social Connections:   . Frequency of Communication with Friends and Family:   . Frequency of Social Gatherings with Friends and Family:   . Attends Religious Services:   . Active Member of Clubs or Organizations:   . Attends Archivist Meetings:   [name] Kitchen Marital Status:     Allergies  Allergen Reactions  . Sulfa Antibiotics Rash     Current Outpatient Medications:    .  acetaminophen (TYLENOL) 325 MG tablet, Take 2 tablets (650 mg total) by mouth every 6 (six) hours as needed for mild pain (or Fever >/= 101)., Disp: , Rfl:  .  BIKTARVY 50-200-25 MG TABS tablet, TAKE 1 TABLET BY MOUTH DAILY, Disp: 30 tablet, Rfl: 11 .  clindamycin (CLEOCIN) 300 MG capsule, TAKE 2 CAPSULES BY MOUTH THREE TIMES DAILY, Disp: 240 capsule, Rfl: 5 .  dapsone 100 MG tablet, Take 1 tablet (100 mg total) by mouth daily., Disp: 30 tablet, Rfl: 1 .  DARAPRIM 25 MG tablet, TAKE 2 TABLETS BY MOUTH DAILY WITH BREAKFAST, Disp: 60 tablet, Rfl: 5 .  leucovorin (WELLCOVORIN) 25 MG tablet, TAKE 1 TABLET BY MOUTH DAILY, Disp: 30 tablet, Rfl: 5 .  ondansetron (ZOFRAN) 4 MG tablet, TAKE 1 TABLET BY MOUTH EVERY 8 HOURS AS NEEDED FOR NAUSEA AND VOMITING, Disp: 90 tablet, Rfl: 3 .  gabapentin (NEURONTIN) 100 MG capsule, Take 1 capsule (100 mg total) by mouth 2 (two) times daily between meals as needed. (Patient not taking: Reported on 09/25/2019), Disp: 60 capsule, Rfl: 1    Review of Systems  Unable to perform ROS: Dementia  HENT: Positive for trouble swallowing.   Gastrointestinal: Positive for nausea and vomiting.  Neurological: Positive for weakness.       Objective:   Physical Exam Constitutional:      General: He is not in acute distress.    Appearance: Normal appearance. He is well-developed. He is not ill-appearing or diaphoretic.  HENT:     Head: Normocephalic and atraumatic.     Right Ear: Hearing and external ear normal.     Left Ear: Hearing and external ear normal.     Nose: No nasal deformity or rhinorrhea.  Eyes:     General: No scleral icterus.    Conjunctiva/sclera:     Right eye: Right conjunctiva is not injected.     Left eye: Left conjunctiva is not injected.  Neck:     Vascular: No JVD.  Cardiovascular:     Rate and Rhythm: Normal rate and regular rhythm.     Heart sounds: S1 normal and S2 normal.  Pulmonary:     Effort: Pulmonary effort is normal. No  respiratory distress.  Abdominal:     Tenderness: There is no abdominal tenderness.  Musculoskeletal:        General: Normal range of motion.     Right shoulder: Normal.     Left shoulder: Normal.     Cervical back: Normal range of motion and neck supple.     Right hip: Normal.     Left hip: Normal.     Right knee: Normal.     Left knee: Normal.  Lymphadenopathy:  Head:     Right side of head: No submandibular, preauricular or posterior auricular adenopathy.     Left side of head: No submandibular, preauricular or posterior auricular adenopathy.     Cervical:     Right cervical: No superficial or deep cervical adenopathy.    Left cervical: No superficial or deep cervical adenopathy.  Skin:    General: Skin is warm and dry.     Coloration: Skin is not pale.     Findings: No abrasion, bruising, ecchymosis, erythema, lesion or rash.     Nails: There is no clubbing.  Neurological:     Mental Status: He is alert. He is disoriented and confused.     Sensory: No sensory deficit.     Motor: Weakness present.     Coordination: Coordination abnormal.  Psychiatric:        Attention and Perception: He is attentive.        Mood and Affect: Mood normal.        Speech: Speech normal.        Behavior: Behavior is cooperative.        Cognition and Memory: Cognition is impaired.     Progress lesions on legs have nearly disappeared similarly the ones on his tongue have almost nearly disappeared    Assessment & Plan:   CNS toxoplasmosis: Continue on maintenance therapy, refilled Zofran for nausea  Nausea and vomiting and dysphagia: We will give him fluconazole in case he has esophageal thrush he does not have oropharyngeal thrush on exam.  Some of the nausea may be related to his toxoplasmosis medications   HIV with AIDS: Continue Biktarvy IWe will recheck labs today  Purplish discoloration of his feet with the one lesion on the left posterior calf.  And worried about Kaposi's but  these are certainly getting better  Unsteady gait: I have ordered MRI of the brain and referring him back to PM&R

## 2020-02-07 LAB — T-HELPER CELL (CD4) - (RCID CLINIC ONLY)
CD4 % Helper T Cell: 11 % — ABNORMAL LOW (ref 33–65)
CD4 T Cell Abs: 291 /uL — ABNORMAL LOW (ref 400–1790)

## 2020-02-09 LAB — COMPLETE METABOLIC PANEL WITH GFR
AG Ratio: 1.5 (calc) (ref 1.0–2.5)
ALT: 24 U/L (ref 9–46)
AST: 24 U/L (ref 10–40)
Albumin: 4.4 g/dL (ref 3.6–5.1)
Alkaline phosphatase (APISO): 99 U/L (ref 36–130)
BUN: 13 mg/dL (ref 7–25)
CO2: 25 mmol/L (ref 20–32)
Calcium: 8.9 mg/dL (ref 8.6–10.3)
Chloride: 103 mmol/L (ref 98–110)
Creat: 0.94 mg/dL (ref 0.60–1.35)
GFR, Est African American: 126 mL/min/{1.73_m2} (ref >=60)
GFR, Est Non African American: 108 mL/min/{1.73_m2} (ref >=60)
Globulin: 2.9 g/dL (calc) (ref 1.9–3.7)
Glucose, Bld: 77 mg/dL (ref 65–99)
Potassium: 3.9 mmol/L (ref 3.5–5.3)
Sodium: 137 mmol/L (ref 135–146)
Total Bilirubin: 1.1 mg/dL (ref 0.2–1.2)
Total Protein: 7.3 g/dL (ref 6.1–8.1)

## 2020-02-09 LAB — CBC WITH DIFFERENTIAL/PLATELET
Absolute Monocytes: 827 cells/uL (ref 200–950)
Basophils Absolute: 87 cells/uL (ref 0–200)
Basophils Relative: 1 %
Eosinophils Absolute: 322 cells/uL (ref 15–500)
Eosinophils Relative: 3.7 %
HCT: 40.4 % (ref 38.5–50.0)
Hemoglobin: 12.9 g/dL — ABNORMAL LOW (ref 13.2–17.1)
Lymphs Abs: 2445 cells/uL (ref 850–3900)
MCH: 31.2 pg (ref 27.0–33.0)
MCHC: 31.9 g/dL — ABNORMAL LOW (ref 32.0–36.0)
MCV: 97.8 fL (ref 80.0–100.0)
MPV: 10.1 fL (ref 7.5–12.5)
Monocytes Relative: 9.5 %
Neutro Abs: 5020 cells/uL (ref 1500–7800)
Neutrophils Relative %: 57.7 %
Platelets: 338 10*3/uL (ref 140–400)
RBC: 4.13 10*6/uL — ABNORMAL LOW (ref 4.20–5.80)
RDW: 12.6 % (ref 11.0–15.0)
Total Lymphocyte: 28.1 %
WBC: 8.7 10*3/uL (ref 3.8–10.8)

## 2020-02-09 LAB — HIV RNA, RTPCR W/R GT (RTI, PI,INT)
HIV 1 RNA Quant: 61 copies/mL — ABNORMAL HIGH
HIV-1 RNA Quant, Log: 1.79 Log copies/mL — ABNORMAL HIGH

## 2020-02-09 LAB — RPR: RPR Ser Ql: NONREACTIVE

## 2020-02-13 ENCOUNTER — Other Ambulatory Visit: Payer: Self-pay | Admitting: Internal Medicine

## 2020-02-19 ENCOUNTER — Other Ambulatory Visit: Payer: Self-pay

## 2020-02-19 ENCOUNTER — Ambulatory Visit (HOSPITAL_COMMUNITY)
Admission: RE | Admit: 2020-02-19 | Discharge: 2020-02-19 | Disposition: A | Discharge status: Home / Self Care | Payer: Self-pay | Source: Ambulatory Visit | Attending: Infectious Disease | Admitting: Infectious Disease

## 2020-02-19 DIAGNOSIS — B589 Toxoplasmosis, unspecified: Secondary | ICD-10-CM | POA: Insufficient documentation

## 2020-02-19 DIAGNOSIS — R269 Unspecified abnormalities of gait and mobility: Secondary | ICD-10-CM | POA: Insufficient documentation

## 2020-02-19 MED ORDER — GADOBUTROL 1 MMOL/ML IV SOLN
9.0000 mL | Freq: Once | INTRAVENOUS | Status: AC | PRN
  Administered 2020-02-19: 9 mL via INTRAVENOUS

## 2020-02-20 ENCOUNTER — Other Ambulatory Visit: Payer: Self-pay

## 2020-02-20 ENCOUNTER — Telehealth: Payer: Self-pay

## 2020-02-20 MED ORDER — ONDANSETRON HCL 4 MG PO TABS: ORAL_TABLET | ORAL | 3 refills | Status: DC

## 2020-02-20 NOTE — Telephone Encounter (Signed)
Refill request for ondansetron approved. Thomas Park

## 2020-02-20 NOTE — Telephone Encounter (Signed)
Received call from [facility] with results regarding MRI.  IMPRESSION: 1. Motion degraded examination. 2. New extensive confluent T2 hyperintensity throughout most of the white matter of the cerebral hemispheres, without evidence of restricted diffusion, contrast enhancement or significant mass effect, suggestive of HIV encephalopathy. Will forward message to MD to review results under imaging. [facility]

## 2020-02-21 ENCOUNTER — Encounter: Payer: Self-pay | Attending: Physical Medicine & Rehabilitation | Admitting: Physical Medicine & Rehabilitation

## 2020-02-21 ENCOUNTER — Other Ambulatory Visit: Payer: Self-pay

## 2020-02-21 ENCOUNTER — Encounter: Payer: Self-pay | Admitting: Physical Medicine & Rehabilitation

## 2020-02-21 VITALS — BP 130/92 | HR 96 | Temp 98.7°F | Ht 65.0 in | Wt 194.0 lb

## 2020-02-21 DIAGNOSIS — B582 Toxoplasma meningoencephalitis: Secondary | ICD-10-CM | POA: Insufficient documentation

## 2020-02-21 DIAGNOSIS — R269 Unspecified abnormalities of gait and mobility: Secondary | ICD-10-CM | POA: Insufficient documentation

## 2020-02-21 NOTE — Telephone Encounter (Signed)
Thanks Lorna Few not sure what to make of these findings. I will review with radiology

## 2020-02-21 NOTE — Progress Notes (Signed)
Subjective:    Patient ID: Thomas Park, male    DOB: 1991/06/06, 29 y.o.   MRN: [id number]  HPI   Thomas Park is here in follow up of his his cns toxo. He never received any therapies after I last saw him in January.  He has been followed by infectious disease who referred him back over here given his ongoing issues with balance and mobility.  Repeat MRI was ordered which was completed on June 7 which showed improvement of his lesions related to the toxoplasmosis but a new confluent hyperintensity throughout the white matter of both cerebral hemispheres consistent with the HIV encephalopathy picture.  For the most part since I last saw him family reports improvement in his vision and balance although sometimes he still struggles with visual acuity although that is less impactful.  He has some issues with tremors of the right arm and sometimes the head as well.  He denies any pain.  He has not fallen.  Sleep is fair.  He usually goes to bed around midnight and sleeps to 5 or 6 AM.  Family reports that mood has been positive.  They deny any depression or agitation.  His bowels and bladder have been functional.  Appetite has been reasonable.  He has had some persistent nausea.  It may he was started on fluconazole for esophageal thrush but this did not really seem to have an impact on his nausea.  He takes Zofran as needed for nausea with some benefit.    Pain Inventory Average Pain 0 Pain Right Now 0 My pain is na  In the last 24 hours, has pain interfered with the following? General activity 0 Relation with others 0 Enjoyment of life 0 What TIME of day is your pain at its worst? na Sleep (in general) Good  Pain is worse with: na Pain improves with: na Relief from Meds: na  Mobility use a walker ability to climb steps?  no do you drive?  no  Function I need assistance with the following:  bathing, meal prep, household duties and  shopping  Neuro/Psych weakness trouble walking  Prior Studies Any changes since last visit?  no  Physicians involved in your care Any changes since last visit?  no   No family history on file. Social History   Socioeconomic History  . Marital status: Single    Spouse name: Not on file  . Number of children: Not on file  . Years of education: Not on file  . Highest education level: Not on file  Occupational History  . Not on file  Tobacco Use  . Smoking status: Never Smoker  . Smokeless tobacco: Never Used  Substance and Sexual Activity  . Alcohol use: Yes    Alcohol/week: 6.0 standard drinks    Types: 6 Cans of beer per week    Comment: 08/10/2018 "drink only on weekend"  . Drug use: Not Currently    Comment: 08/10/2018 "have tried coke; been a long time since I've usd any"  . Sexual activity: Not Currently  Other Topics Concern  . Not on file  Social History Narrative  . Not on file   Social Determinants of Health   Financial Resource Strain:   . Difficulty of Paying Living Expenses:   Food Insecurity:   . Worried About Charity fundraiser in the Last Year:   . Arboriculturist in the Last Year:   Transportation Needs:   .  Lack of Transportation (Medical):   [name] Kitchen Lack of Transportation (Non-Medical):   Physical Activity:   . Days of Exercise per Week:   . Minutes of Exercise per Session:   Stress:   . Feeling of Stress :   Social Connections:   . Frequency of Communication with Friends and Family:   . Frequency of Social Gatherings with Friends and Family:   . Attends Religious Services:   . Active Member of Clubs or Organizations:   . Attends Archivist Meetings:   [name] Kitchen Marital Status:    Past Surgical History:  Procedure Laterality Date  . NO PAST SURGERIES     Past Medical History:  Diagnosis Date  . Anemia    [name] 08/10/2018  . Dysphagia 02/06/2020  . HIV (human immunodeficiency virus infection) ([place])    dx'd 07/2018  . 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 Neuropathy 09/21/2018  . 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[place]) 09/21/2018  . Right sided weakness 09/21/2018  . Tongue lesion 09/21/2018  . 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  Neurological: Positive for weakness.  All other systems reviewed and are negative.      Objective:   Physical Exam  General: No acute distress HEENT: EOMI, oral membranes moist Cards: reg rate  Chest: normal effort Abdomen: Soft, NT, ND Skin: dry, intact Extremities: no edema Neuro: improved EOMI, no diplopia or blurred vision. Patient with improved attention and awareness.  Still has difficulties with processing simple information and following commands.  He can perseverate on certain activities as well. remains impulsive. Steppage gait on right side.  Leans quite a bit to the right side with gait due to the impulsive steppage movements he employs with the right leg.  Right arm and head with tremor, some dyskinesias.  Sensory exam is normal on right. Decreased sensation to LT and Pain on left. . Reflexes are 2+ in all 4's.  4-5 out of 5 right more than left. Musculoskeletal: Normal range of motion.  No pain with palpation in extremities or trunk. Psych: Pt's affect is appropriate. Pt is cooperative             Assessment & Plan:  1.  Decreased functional mobility with cognitive deficits secondary to acute encephalopathy related toxoplasmosis in the setting of immunosuppression due to recently diagnosed HIV.              -He has overall improved  from when I saw him in January but has some persistent cognitive and balance deficits.  -Persistent balance deficits.  Made referral to outpatient physical therapy at [facility] neuro rehab for assessment and treatment.  He really needs cognitive therapies as well but he will be limited in visits given his self-pay status unfortunately. 2.  Pain Management:  Tylenol as needed 3. Mood:  No active issues here 4. Neuropsych: This patient is not capable of making decisions on his own behalf. 5. HIV. plan  per ID.  I assume that Dr. Drucilla Schmidt will follow up with the patient regarding his MRI results. 6.  Vision: Patient demonstrates improvement in his visual acuity as well as his binocular vision.  Still some problems with tracking to extreme positions and intermittent blurred vision 7.  Right-sided tremors/dyskinesias.  I spoke to the patient and his father about the fact that while we could potentially help these somewhat I would be hesitant to add medication that would further impact his overall balance and cognitive abilities which already are impaired.  I think the best bet is to work with these in therapy for now and see where we go from there.          15 minutes was spent with the patient and family in the room today.  I will follow up with him in about 2 months time.

## 2020-02-21 NOTE — Patient Instructions (Signed)
PLEASE FEEL FREE TO CALL OUR OFFICE WITH ANY PROBLEMS OR QUESTIONS ([phone])      

## 2020-02-22 ENCOUNTER — Other Ambulatory Visit: Payer: Self-pay | Admitting: Infectious Disease

## 2020-02-22 DIAGNOSIS — A812 Progressive multifocal leukoencephalopathy: Secondary | ICD-10-CM

## 2020-02-23 ENCOUNTER — Telehealth: Payer: Self-pay | Admitting: *Deleted

## 2020-02-23 NOTE — Telephone Encounter (Signed)
Someone in our health care system needs to step it up with charity care. We cant say we are putting patients first and never tell pts to go to other hospitals if we cannot provide the care that they need. Thanks so much for all o fyour help this is a very weird case,

## 2020-02-23 NOTE — Telephone Encounter (Signed)
-----   Message from Truman Hayward, MD sent at 02/22/2020  2:47 PM EDT ----- I reviewed patients MRI with Neuroradiology. His Toxo lesions have resolved but he has  new EXTENSIVE WHITE matter disease which does NOT fit with this clinical  picture. 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 HIV directly causing this seems unlikelyi since virus is controlled  IRIS is possible but also doesn't fit well. I will refer him to Neurology. Can someone like  Audelia Hives explain to the patient that I have reviewed this  and am referring to Neuro

## 2020-02-23 NOTE — Telephone Encounter (Signed)
[name] at [facility] will speak with [name] and [name] to 1) bring patient into medical case management and 2) see if they can help.  Audelia Hives spoke with Thomas Park's sister. They as a family will come up with the $200 for this office visit.  Audelia Hives will connect them to [facility] on Monday, assist them in making the appointment. Will still need connections to coverage/care/insurance for future visits.

## 2020-02-23 NOTE — Telephone Encounter (Signed)
RN reached out to [place] for help communicating the following information to [place] (the toxo infection looks better, but there are new changes on his MRI and we need a neurology referral to help figure this out). Audelia Hives had to leave a message.   - Neurology is requiring a $200 copay to see the patient in office.  Alternatively, he could go to the Emergency Room and they will follow him in office once without copay if they are called for consult during his ER visit.  Audelia Hives is asking [name] if he is able to pay any of this.  RN will also connect him to [facility] to see if they can provide copay assistance. - Left message for Lanelle Bal at [facility] (funds for outpatient ambulatory visits, may need letter from Neuro with this amount), spoke with Chavanne at [facility] (no copay assistance available).  Will see if Kathy/Mitch can help with charity care (orange card/[facility], [facility]).  Karn Pickler will return to the office Tuesday. Landis Gandy, RN

## 2020-02-27 ENCOUNTER — Ambulatory Visit: Payer: Self-pay | Admitting: Neurology

## 2020-02-28 NOTE — Telephone Encounter (Signed)
Patient scheduled 02/29/2020 1:00 PM with Dr Felecia Shelling, [facility].

## 2020-02-29 ENCOUNTER — Other Ambulatory Visit: Payer: Self-pay

## 2020-02-29 ENCOUNTER — Encounter: Payer: Self-pay | Admitting: Neurology

## 2020-02-29 ENCOUNTER — Ambulatory Visit: Payer: Self-pay | Admitting: Neurology

## 2020-02-29 VITALS — BP 141/89 | HR 102 | Ht 65.0 in | Wt 195.3 lb

## 2020-02-29 DIAGNOSIS — H539 Unspecified visual disturbance: Secondary | ICD-10-CM | POA: Insufficient documentation

## 2020-02-29 DIAGNOSIS — R9082 White matter disease, unspecified: Secondary | ICD-10-CM | POA: Insufficient documentation

## 2020-02-29 DIAGNOSIS — R269 Unspecified abnormalities of gait and mobility: Secondary | ICD-10-CM

## 2020-02-29 DIAGNOSIS — R4189 Other symptoms and signs involving cognitive functions and awareness: Secondary | ICD-10-CM | POA: Insufficient documentation

## 2020-02-29 DIAGNOSIS — B20 Human immunodeficiency virus [HIV] disease: Secondary | ICD-10-CM

## 2020-02-29 NOTE — Progress Notes (Signed)
[facility]  PATIENT: Thomas Park DOB: Aug 06, 1991  REFERRING DOCTOR OR PCP:  Dr. Tommy Medal SOURCE: patient, sister, notes from ID, imaging and lab reports, MRI images personally reviewed  _________________________________   HISTORICAL  CHIEF COMPLAINT:  Chief Complaint  Patient presents with  . New Patient (Initial Visit)    Rm 12 with and sister interpreter- sister sts pt is here to discuss recent MRI that PCP completed.     HISTORY OF PRESENT ILLNESS:  I had the pleasure of seeing a Thomas Park, at [facility] for neurologic consultation regarding his neurologic symptoms and abnormal brain MRI.  He is a 29 year old male who is HIV positive.  He is currently on Biktarvy and also takes Daraprim and dapsone and fluconazole.  His last  HIV RNA test shows 61 copies (1.79 log copies).  CD4 count is 291.  For comparison, at the time of his HIV/toxoplasma diagnosis November 2019, the HIV RNA was 397,000 copies (log 5.59) and a CD4 count was only 10.  Recently, his eyes became red and he began to note a dry mouth last month.   He had more difficulty with clumsiness, especially on the left.   His sister notes that he has had some cognitive issues but only slightly worsened over the last few months.     He has not had any seizures.   He denies headaches.    Since his toxoplasma infection in late 2019, he has had handwringing/athetosis type movements.  These have slightly worsened over the last few months.  Additionally, his vision is mildly worse over the past few months.     He denies pain or numbness.  He has no bladder difficulty,  I personally reviewed the MRIs of the brain from 08/10/2018 and 02/19/2020.  The MRI from 2019 showed multiple enhancing lesions with edema/mass-effect.  These were located in the basal ganglia, cerebellum and right temporal lobe.  They were consistent with toxoplasma.  MRI of the brain  from 02/19/2020 showed resolution of the edema from these lesions though sequela of the foci were still present and there was a small amount of enhancement with a couple of them.  There was fairly symmetric T2 flair hyperintense changes involving the white matter in both hemispheres, extensively.  The new changes did not enhance.   REVIEW OF SYSTEMS: Constitutional: No fevers, chills, sweats, or change in appetite Eyes: He has had eye redness without pain over the last few months. Ear, nose and throat: No hearing loss, ear pain, nasal congestion, sore throat Cardiovascular: No chest pain, palpitations Respiratory: No shortness of breath at rest or with exertion.   No wheezes GastrointestinaI: No nausea, vomiting, diarrhea, abdominal pain, fecal incontinence Genitourinary: No dysuria, urinary retention or frequency.  No nocturia. Musculoskeletal: No neck pain, back pain Integumentary: No rash, pruritus, skin lesions Neurological: as above Psychiatric: No depression at this time.  No anxiety Endocrine: No palpitations, diaphoresis, change in appetite, change in weigh or increased thirst Hematologic/Lymphatic: No anemia, purpura, petechiae. Allergic/Immunologic: No itchy/runny eyes, nasal congestion, recent allergic reactions, rashes  ALLERGIES: Allergies  Allergen Reactions  . Sulfa Antibiotics Rash    HOME MEDICATIONS:  Current Outpatient Medications:  .  acetaminophen (TYLENOL) 325 MG tablet, Take 2 tablets (650 mg total) by mouth every 6 (six) hours as needed for mild pain (or Fever >/= 101)., Disp: , Rfl:  .  BIKTARVY 50-200-25 MG TABS tablet, TAKE 1 TABLET BY  MOUTH DAILY, Disp: 30 tablet, Rfl: 11 .  clindamycin (CLEOCIN) 300 MG capsule, TAKE 2 CAPSULES BY MOUTH THREE TIMES DAILY, Disp: 240 capsule, Rfl: 5 .  dapsone 100 MG tablet, Take 1 tablet (100 mg total) by mouth daily., Disp: 30 tablet, Rfl: 1 .  DARAPRIM 25 MG tablet, TAKE 2 TABLETS BY MOUTH DAILY WITH BREAKFAST, Disp: 60  tablet, Rfl: 5 .  fluconazole (DIFLUCAN) 100 MG tablet, Take 1 tablet (100 mg total) by mouth daily., Disp: 14 tablet, Rfl: 1 .  gabapentin (NEURONTIN) 100 MG capsule, Take 1 capsule (100 mg total) by mouth 2 (two) times daily between meals as needed., Disp: 60 capsule, Rfl: 1 .  leucovorin (WELLCOVORIN) 25 MG tablet, TAKE 1 TABLET BY MOUTH DAILY, Disp: 30 tablet, Rfl: 5 .  ondansetron (ZOFRAN) 4 MG tablet, TAKE 1 TABLET BY MOUTH EVERY 8 HOURS AS NEEDED FOR NAUSEA AND VOMITING, Disp: 90 tablet, Rfl: 3  PAST MEDICAL HISTORY: Past Medical History:  Diagnosis Date  . Anemia    Thomas Park 08/10/2018  . Dysphagia 02/06/2020  . HIV (human immunodeficiency virus infection) ([place])    dx'd 07/2018  . IRIS (immune reconstitution inflammatory syndrome) ([place]) 09/21/2018  . 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 Neuropathy 09/21/2018  . 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[place]) 09/21/2018  . Right sided weakness 09/21/2018  . Tongue lesion 09/21/2018  . 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 NO PAST SURGERIES      FAMILY HISTORY: No family history on file.  SOCIAL HISTORY:  Social History   Socioeconomic History  . Marital status: Single    Spouse name: Not on file  . Number of children: Not on file  . Years of education: Not on file  . Highest education level: Not on file  Occupational History  . Not on file  Tobacco Use  . Smoking status: Never Smoker  . Smokeless tobacco: Never Used  Vaping Use  . Vaping Use: Never used  Substance and Sexual Activity  . Alcohol use: Yes    Alcohol/week: 6.0 standard drinks    Types: 6 Cans of beer per week    Comment: 08/10/2018 "drink only on weekend"  . Drug use: Not Currently    Comment: 08/10/2018 "have tried coke; been a long time since I've usd any"  . Sexual activity: Not Currently  Other Topics Concern  . Not on file  Social History Narrative  . Not on file   Social  Determinants of Health   Financial Resource Strain:   . Difficulty of Paying Living Expenses:   Food Insecurity:   . Worried About Charity fundraiser in the Last Year:   . Arboriculturist in the Last Year:   Transportation Needs:   . Film/video editor (Medical):   [name] Kitchen Lack of Transportation (Non-Medical):   Physical Activity:   . Days of Exercise per Week:   . Minutes of Exercise per Session:   Stress:   . Feeling of Stress :   Social Connections:   . Frequency of Communication with Friends and Family:   . Frequency of Social Gatherings with Friends and Family:   . Attends Religious Services:   . Active Member of Clubs or Organizations:   . Attends Archivist Meetings:   [name] Kitchen Marital Status:   Intimate Partner Violence:   . Fear of Current or Ex-Partner:   . Emotionally Abused:   .  Physically Abused:   . Sexually Abused:      PHYSICAL EXAM  Vitals:   02/29/20 1257  BP: (!) 141/89  Pulse: (!) 102  Weight: 195 lb 5 oz (88.6 kg)  Height: 5\' 5"  (1.651 m)    Body mass index is 32.5 kg/m.   General: The patient is well-developed and well-nourished and in no acute distress  HEENT:  Head is [place]/AT.  Sclera are anicteric.  He has mild cornea erythema.  Funduscopic exam shows normal optic discs and retinal vessels.  Neck: No carotid bruits are noted.  The neck is nontender.  Cardiovascular: The heart has a regular rate and rhythm with a normal S1 and S2. There were no murmurs, gallops or rubs.    Skin: Extremities are without rash or  edema.  Musculoskeletal:  Back is nontender  Neurologic Exam  Mental status: The patient is alert and oriented x 3 at the time of the examination. The patient has apparent normal recent and remote memory, with an apparently normal attention span and concentration ability.   Speech is normal.  Cranial nerves: Extraocular movements are full. Pupils are equal, round, and reactive to light and accomodation.  Visual fields are  full.  He has no difficulty counting fingers.  He reports colors are symmetric.  Facial symmetry is present. There is good facial sensation to soft touch bilaterally.Facial strength is normal.  Trapezius and sternocleidomastoid strength is normal. No dysarthria is noted.  The tongue is midline, and the patient has symmetric elevation of the soft palate. No obvious hearing deficits are noted.  Motor:  Muscle bulk is normal.   Tone is normal. Strength is  5 / 5 in all 4 extremities.   Sensory: Sensory testing is intact to pinprick, soft touch and vibration sensation in all 4 extremities.  Coordination: He has athetosis, right greater than left most notable when he walks..  Cerebellar testing reveals reduced finger-nose-finger and heel-to-shin bilaterally.  Gait and station: Station is normal.   The gait is wide.  Tandem gait is markedly wide.  Romberg is negative.   Reflexes: Deep tendon reflexes are symmetric and 2 in the arms and 3 in the legs.  Plantar responses are flexor.    DIAGNOSTIC DATA (LABS, IMAGING, TESTING) - I reviewed patient records, labs, notes, testing and imaging myself where available.  Lab Results  Component Value Date   WBC 8.7 02/06/2020   HGB 12.9 (L) 02/06/2020   HCT 40.4 02/06/2020   MCV 97.8 02/06/2020   PLT 338 02/06/2020      Component Value Date/Time   NA 137 02/06/2020 0919   K 3.9 02/06/2020 0919   CL 103 02/06/2020 0919   CO2 25 02/06/2020 0919   GLUCOSE 77 02/06/2020 0919   BUN 13 02/06/2020 0919   CREATININE 0.94 02/06/2020 0919   CALCIUM 8.9 02/06/2020 0919   PROT 7.3 02/06/2020 0919   ALBUMIN 3.7 09/01/2018 1036   AST 24 02/06/2020 0919   ALT 24 02/06/2020 0919   ALKPHOS 74 09/01/2018 1036   BILITOT 1.1 02/06/2020 0919   GFRNONAA 108 02/06/2020 0919   GFRAA 126 02/06/2020 0919   Lab Results  Component Value Date   CHOL 132 12/13/2018   HDL 27 (L) 12/13/2018   LDLCALC 59 12/13/2018   TRIG 383 (H) 12/13/2018   CHOLHDL 4.9 12/13/2018        ASSESSMENT AND PLAN  AIDS ([place]) - Plan: DG FL GUIDED LUMBAR PUNCTURE  Neurologic gait disorder  White matter  abnormality on MRI of brain - Plan: DG FL GUIDED LUMBAR PUNCTURE  Cognitive decline - Plan: DG FL GUIDED LUMBAR PUNCTURE  Visual disturbance  In summary, the patient is a 29 year old man with HIV/AIDS with progressive neurologic worsening over the past few months accompanied by an MRI showing severe white matter changes.  There is involvement of both the deep white matter and the juxtacortical/U fiber white matter.  I had a discussion with his family about the significance of the MRI.  It is clearly abnormal.  The white matter changes are new compared to the 2019 MRI.  He does not neatly fit into any category.  The involvement of the juxtacortical/U fiber white matter is worrisome for PML though he does not have any hyperintensity on diffusion-weighted images.  Additionally, the widespread nature would be less typical.  Furthermore, he has had CD4 counts above 200 and very low HIV RNA copies for a while.  It would be unusual to develop PML in this circumstance.  The changes and his progressive neurologic decline could be due to HIV encephalopathy.  However, this usually spares the juxtacortical/U fibers white matter which is so clearly involved in his case.  He has been on HAART therapy a while and I do not think he would have ongoing IRIS, though it is certainly possible that the MRI changes were due to this condition shortly after beginning therapy in late 2019, early 2020.  To help sort out his current issues, I will check a lumbar puncture.  We will send CSF for JCV DNA PCR.  We will also check VDRL and crypto antigen.  We will let him know the results of the lumbar puncture and bring her back into see [place] afterwards.  If the JCV PCR is negative, then this would rule out PML and his changes would most likely be due to HIV encephalopathy.  Thank you for asking me to see Thomas Park.   Please let me know if I can be of further assistance with him or other patients in the future.   Lurene Shadow  [phone] Jonnathan Birman A. Felecia Shelling, MD, [facility] 6/38/4665, 9:93 PM Certified in Neurology, Clinical Neurophysiology, Sleep Medicine and Neuroimaging  [facility] [address], [place], [place] [address] [phone]

## 2020-03-01 ENCOUNTER — Emergency Department (HOSPITAL_COMMUNITY)
Admission: EM | Admit: 2020-03-01 | Discharge: 2020-03-02 | Disposition: A | Discharge status: Home / Self Care | Payer: Self-pay | Attending: Emergency Medicine | Admitting: Emergency Medicine

## 2020-03-01 ENCOUNTER — Other Ambulatory Visit: Payer: Self-pay

## 2020-03-01 ENCOUNTER — Encounter (HOSPITAL_COMMUNITY): Payer: Self-pay

## 2020-03-01 DIAGNOSIS — B20 Human immunodeficiency virus [HIV] disease: Secondary | ICD-10-CM | POA: Insufficient documentation

## 2020-03-01 DIAGNOSIS — Z79899 Other long term (current) drug therapy: Secondary | ICD-10-CM | POA: Insufficient documentation

## 2020-03-01 DIAGNOSIS — H5500 Unspecified nystagmus: Secondary | ICD-10-CM

## 2020-03-01 DIAGNOSIS — R9389 Abnormal findings on diagnostic imaging of other specified body structures: Secondary | ICD-10-CM

## 2020-03-01 DIAGNOSIS — R270 Ataxia, unspecified: Secondary | ICD-10-CM

## 2020-03-01 LAB — COMPREHENSIVE METABOLIC PANEL
ALT: 38 U/L (ref 0–44)
AST: 37 U/L (ref 15–41)
Albumin: 4.3 g/dL (ref 3.5–5.0)
Alkaline Phosphatase: 102 U/L (ref 38–126)
Anion gap: 9 (ref 5–15)
BUN: 14 mg/dL (ref 6–20)
CO2: 24 mmol/L (ref 22–32)
Calcium: 9.1 mg/dL (ref 8.9–10.3)
Chloride: 104 mmol/L (ref 98–111)
Creatinine, Ser: 0.99 mg/dL (ref 0.61–1.24)
GFR calc Af Amer: >60 mL/min (ref >60)
GFR calc non Af Amer: >60 mL/min (ref >60)
Glucose, Bld: 99 mg/dL (ref 70–99)
Potassium: 3.4 mmol/L — ABNORMAL LOW (ref 3.5–5.1)
Sodium: 137 mmol/L (ref 135–145)
Total Bilirubin: 0.8 mg/dL (ref 0.3–1.2)
Total Protein: 8.2 g/dL — ABNORMAL HIGH (ref 6.5–8.1)

## 2020-03-01 LAB — CBC
HCT: 45.6 % (ref 39.0–52.0)
Hemoglobin: 14.7 g/dL (ref 13.0–17.0)
MCH: 30.9 pg (ref 26.0–34.0)
MCHC: 32.2 g/dL (ref 30.0–36.0)
MCV: 95.8 fL (ref 80.0–100.0)
Platelets: 373 10*3/uL (ref 150–400)
RBC: 4.76 MIL/uL (ref 4.22–5.81)
RDW: 13.2 % (ref 11.5–15.5)
WBC: 10.3 10*3/uL (ref 4.0–10.5)
nRBC: 0 % (ref 0.0–0.2)

## 2020-03-01 NOTE — ED Triage Notes (Signed)
Pt BIB sister for eval per neurology request. Per Neuro note, pt had severely abnormal MRI concerning for PML in the setting of HIV/AIDS. Per note, pt had LP done w/ JCV PCR obtained, and if that was negative, that would rule out PML. Neurologist felt if PML was negative, MRI changes and AMS would be d/t HIV encephalopathy.

## 2020-03-02 LAB — CBG MONITORING, ED: Glucose-Capillary: 85 mg/dL (ref 70–99)

## 2020-03-02 NOTE — Discharge Instructions (Signed)
Llama la officina debajo para dar otra cita por el examen.  Regresa a la sala de emenergcia si tiene fiebre, si la mentacion cambia, o con nuevas sintomas.

## 2020-03-02 NOTE — ED Provider Notes (Signed)
[place] EMERGENCY DEPARTMENT Provider Note   CSN: [id number] Arrival date & time: 03/01/20  1702     History Chief Complaint  Patient presents with  . Altered Mental Status    Thomas Park is a 29 y.o. male presenting for evaluation of abnormal neurologic exam.  Patient states he was told to come to the emergency room to get an LP due to abnormal neuro exam.  He saw his neurologist 2 days ago.  At that time, they reviewed his recent MRI results, which showed improvement of previous toxoplasmosis infection, however he has severe white matter changes.  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  Patient reports since that visit, he has not had any change in his symptoms.  Denies fevers, chills, or acute worsening of mental status.  He states he has difficulty walking and difficulty with his vision for several months, this is staying the same but not improving or worsening.  He denies chest pain, shortness breath, nausea, vomiting abdominal pain.  He denies new numbness or tingling.  He follows with Dr. Felecia Shelling from neurology.   Additional history obtained from chart review.  Patient with a history of HIV diagnosed in 2019, history of toxoplasmosis encephalitis, history of white matter abnormality on brain MRI.  I reviewed patient's recent neurology note, in which they discussed plan to get an LP. IR LP was ordered by neruology at that time.   HPI     Past Medical History:  Diagnosis Date  . Anemia    Thomas Park 08/10/2018  . Dysphagia 02/06/2020  . HIV (human immunodeficiency virus infection) (Thomas Park)    dx'd 07/2018  . 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 Neuropathy 09/21/2018  . 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 Right sided weakness 09/21/2018  . Tongue lesion 09/21/2018  . 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 White matter abnormality on MRI of brain 02/29/2020  . Cognitive decline 02/29/2020  . Visual disturbance 02/29/2020  . Dysphagia 02/06/2020  . 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 Tongue lesion 09/21/2018  . Right sided weakness 09/21/2018  . Neuropathy 09/21/2018  . IRIS (immune reconstitution inflammatory syndrome) ([place]) 09/21/2018  . Drug rash   . Neurologic gait disorder   . Brain lesion   . Confusion   . Hyponatremia   . Substance abuse (Thomas Park)   . Cerebritis 08/10/2018  . AIDS (Thomas Park)   . CNS toxoplasmosis Thomas Park)     Past Surgical History:  Procedure Laterality Date  . NO PAST SURGERIES         History reviewed. No pertinent family history.  Social History   Tobacco Use  . Smoking status: Never Smoker  . Smokeless tobacco: Never Used  Vaping Use  . Vaping Use: Never used  Substance Use Topics  . Alcohol use: Yes    Alcohol/week: 6.0 standard drinks    Types: 6 Cans of beer per week    Comment: 08/10/2018 "drink only on weekend"  . Drug use: Not Currently    Comment: 08/10/2018 "have tried coke; been a long time since I've usd any"    Home Medications Prior to Admission medications   Medication Sig Start Date End Date Taking? Authorizing Provider  acetaminophen (TYLENOL) 325 MG  tablet Take 2 tablets (650 mg total) by mouth every 6 (six) hours as needed for mild pain (or Fever >/= 101). 08/18/18   Thomas Coss, MD  BIKTARVY [phone] MG TABS tablet TAKE 1 TABLET BY MOUTH DAILY 08/31/19   Thomas Basques, MD  clindamycin (CLEOCIN) 300 MG capsule TAKE 2 CAPSULES BY MOUTH THREE TIMES DAILY 12/22/19   Thomas Park, Thomas Islam, MD  dapsone 100 MG tablet Take 1 tablet (100 mg total) by mouth daily. 01/19/20   Thomas Hayward, MD  DARAPRIM 25 MG tablet TAKE 2 TABLETS BY MOUTH DAILY WITH BREAKFAST 02/13/20   Thomas Park, Thomas Islam, MD  fluconazole (DIFLUCAN)  100 MG tablet Take 1 tablet (100 mg total) by mouth daily. 02/06/20   Thomas Hayward, MD  gabapentin (NEURONTIN) 100 MG capsule Take 1 capsule (100 mg total) by mouth 2 (two) times daily between meals as needed. 09/21/18   Thomas Hayward, MD  leucovorin (WELLCOVORIN) 25 MG tablet TAKE 1 TABLET BY MOUTH DAILY 02/13/20   Thomas Park, Thomas Islam, MD  ondansetron (ZOFRAN) 4 MG tablet TAKE 1 TABLET BY MOUTH EVERY 8 HOURS AS NEEDED FOR NAUSEA AND VOMITING 02/20/20   Thomas Park, Thomas Regal, MD    Allergies    Sulfa antibiotics  Review of Systems   Review of Systems  Neurological:       Abnormal gait and vision  All other systems reviewed and are negative.   Physical Exam Updated Vital Signs BP (!) 142/102 (BP Location: Right Arm)   Pulse 90   Temp 98.1 F (36.7 C) (Oral)   Resp 16   Ht 5\' 5"  (1.651 m)   Wt 89 kg   SpO2 96%   BMI 32.65 kg/m   Physical Exam Vitals and nursing note reviewed.  Constitutional:      General: He is not in acute distress.    Appearance: He is well-developed.     Comments: nontoxic  HENT:     Head: Normocephalic and atraumatic.  Eyes:     Extraocular Movements:     Right eye: Nystagmus present.     Left eye: Nystagmus present.     Conjunctiva/sclera:     Right eye: Right conjunctiva is injected.     Left eye: Left conjunctiva is injected.     Pupils: Pupils are equal, round, and reactive to light.     Comments: Bilateral horizontal nystagmus.  Scleral injection, left worse than right  Cardiovascular:     Rate and Rhythm: Normal rate and regular rhythm.     Pulses: Normal pulses.  Pulmonary:     Effort: Pulmonary effort is normal. No respiratory distress.     Breath sounds: Normal breath sounds. No wheezing.  Abdominal:     General: There is no distension.     Palpations: Abdomen is soft. There is no mass.     Tenderness: There is no abdominal tenderness. There is no guarding or rebound.  Musculoskeletal:        General: Normal range of motion.      Cervical back: Normal range of motion and neck supple.  Skin:    General: Skin is warm and dry.  Neurological:     Mental Status: He is alert and oriented to person, place, and time.     GCS: GCS eye subscore is 4. GCS verbal subscore is 5. GCS motor subscore is 6.     Comments: Difficulty with finger-to-nose.  Negative pronator drift.  Sensation  intact x4.  Strength equal x4.  Facial symmetry     ED Results / Procedures / Treatments   Labs (all labs ordered are listed, but only abnormal results are displayed) Labs Reviewed  COMPREHENSIVE METABOLIC PANEL - Abnormal; Notable for the following components:      Result Value   Potassium 3.4 (*)    Total Protein 8.2 (*)    All other components within normal limits  CBC  CBG MONITORING, ED    EKG None  Radiology No results found.  Procedures Procedures (including critical care time)  Medications Ordered in ED Medications - No data to display  ED Course  I have reviewed the triage vital signs and the nursing notes.  Pertinent labs & imaging results that were available during my care of the patient were reviewed by me and considered in my medical decision making (see chart for details).   MDM Rules/Calculators/A&P                          Patient presenting for further evaluation of abnormal neurological exam and MRI.  On further investigation, this has been chronic, going on several months.  It is not worsening.  No acute changes.  No fever.  Patient was sent to get an LP, however I believe this was likely a miscommunication and he came to the ER instead of going to the radiology department to get his IR LP done.  As he is without acute neurologic changes or fever, I do not believe he needs an emergent LP in the ED.  I feel the risks outweigh the benefits.  Discussed with patient and father.  Discussed likely miscommunication, and importance of following up with neurology for rescheduling the LP.  Labs obtained from triage  viewed interpreted by me, no leukocytosis or concerning electrolyte abnormality.  Encouraged return to the ER with any worsening symptoms.  At this time, patient appears safe for discharge.  Return precautions given.  Patient states he understands and agrees to plan.  Final Clinical Impression(s) / ED Diagnoses Final diagnoses:  Ataxia  Nystagmus  Abnormal MRI    Rx / DC Orders ED Discharge Orders    None       Franchot Heidelberg, PA-C 03/02/20 0335    Ripley Fraise, MD 03/02/20 0451

## 2020-03-11 ENCOUNTER — Telehealth: Payer: Self-pay

## 2020-03-11 NOTE — Telephone Encounter (Signed)
Received a call from [facility] at GI. She needs to know the specific JCV order Dr. Felecia Shelling needs.  I spoke with Dr. Felecia Shelling. He would like the JCV [facility] order [place] be [id number] Quantitative Viral Load JCV.  I called Tillie Rung and gave her this order. She verbalized understanding.

## 2020-03-12 ENCOUNTER — Ambulatory Visit
Admission: RE | Admit: 2020-03-12 | Discharge: 2020-03-12 | Disposition: A | Discharge status: Home / Self Care | Payer: Self-pay | Source: Ambulatory Visit | Attending: Neurology | Admitting: Neurology

## 2020-03-12 ENCOUNTER — Other Ambulatory Visit: Payer: Self-pay

## 2020-03-12 ENCOUNTER — Other Ambulatory Visit (HOSPITAL_COMMUNITY)
Admission: RE | Admit: 2020-03-12 | Discharge: 2020-03-12 | Disposition: A | Discharge status: Home / Self Care | Payer: Self-pay | Source: Ambulatory Visit | Attending: Neurology | Admitting: Neurology

## 2020-03-12 VITALS — BP 135/87 | HR 93

## 2020-03-12 DIAGNOSIS — B20 Human immunodeficiency virus [HIV] disease: Secondary | ICD-10-CM

## 2020-03-12 DIAGNOSIS — R9082 White matter disease, unspecified: Secondary | ICD-10-CM

## 2020-03-12 DIAGNOSIS — R4181 Age-related cognitive decline: Secondary | ICD-10-CM | POA: Insufficient documentation

## 2020-03-12 DIAGNOSIS — B582 Toxoplasma meningoencephalitis: Secondary | ICD-10-CM

## 2020-03-12 DIAGNOSIS — B589 Toxoplasmosis, unspecified: Secondary | ICD-10-CM | POA: Insufficient documentation

## 2020-03-12 DIAGNOSIS — G0490 Encephalitis and encephalomyelitis, unspecified: Secondary | ICD-10-CM | POA: Insufficient documentation

## 2020-03-12 DIAGNOSIS — G939 Disorder of brain, unspecified: Secondary | ICD-10-CM

## 2020-03-12 DIAGNOSIS — R2689 Other abnormalities of gait and mobility: Secondary | ICD-10-CM | POA: Insufficient documentation

## 2020-03-12 DIAGNOSIS — R269 Unspecified abnormalities of gait and mobility: Secondary | ICD-10-CM

## 2020-03-12 DIAGNOSIS — R410 Disorientation, unspecified: Secondary | ICD-10-CM | POA: Insufficient documentation

## 2020-03-12 DIAGNOSIS — R4189 Other symptoms and signs involving cognitive functions and awareness: Secondary | ICD-10-CM

## 2020-03-12 LAB — CYTOLOGY - NON PAP

## 2020-03-12 NOTE — Discharge Instructions (Signed)
Puncin lumbar Lumbar Puncture La puncin lumbar es un procedimiento por el que se extrae una pequea cantidad del lquido que rodea el cerebro y la mdula espinal (lquido cefalorraqudeo o LCR). Luego, el lquido se examina en Equities trader. Este procedimiento se Optometrist para lo siguiente:  Ayudar a Insurance claims handler, como la meningitis, la encefalitis, la esclerosis mltiple y otras infecciones.  Drenar lquido y Human resources officer presin que se presenta con ciertos tipos de cefaleas.  Detectar hemorragias en las zonas del cerebro y la mdula espinal (sistema nervioso central).  Administrar medicamentos en el lquido cefalorraqudeo. Informe al mdico acerca de lo siguiente:  Cualquier alergia que tenga.  Todos los [employer] [place], incluso vitaminas, hierbas, gotas oftlmicas, cremas y medicamentos de venta libre, como la aspirina o lo antiinflamatorios no esteroideos (AINE).  Cualquier problema previo que usted o los miembros de su familia hayan tenido con anestsicos.  Cualquier enfermedad de la sangre que tenga.  Cirugas a las que se someti.  Cualquier enfermedad que tenga.  Si est embarazada o podra estarlo. Cules son los riesgos? En general, se trata de un procedimiento seguro. Sin embargo, pueden ocurrir complicaciones, por ejemplo:  Infeccin en el lugar de la insercin que se puede extender hasta el hueso o el lquido cefalorraqudeo.  Hemorragia.  Cefalea por puncin lumbar. Es una cefalea grave que se presenta cuando hay prdida de lquido cefalorraqudeo.  Prdida de LCR.  Reacciones alrgicas a los medicamentos o a los tintes.  Daos a Catering manager u otros rganos. Qu ocurre antes del procedimiento? Mantenerse hidratado Siga las indicaciones del mdico acerca de la hidratacin, las cuales pueden incluir lo siguiente:  Hasta 2horas antes del procedimiento, puede beber lquidos transparentes, como agua, jugos frutales  transparentes, caf negro y t solo. Restricciones en las comidas y bebidas Viola comidas y las bebidas. Si le van a Architectural technologist un medicamento para ayudarlo a Nurse, children's (sedante), las indicaciones pueden incluir lo siguiente:  Ocho horas antes del procedimiento, deje de ingerir comidas o alimentos pesados, como carne, alimentos fritos o alimentos grasos.  Seis horas antes del procedimiento, deje de ingerir comidas o alimentos livianos, como tostadas o cereales.  Seis horas antes del procedimiento, deje de beber [place] o bebidas que [employer].  Dos horas antes del procedimiento, deje de beber lquidos transparentes. Medicamentos  Consulte al mdico si debe hacer o no lo siguiente: ? Cambiar o suspender los medicamentos que toma habitualmente. Esto es especialmente importante si toma medicamentos para la diabetes o anticoagulantes. ? Tomar medicamentos como aspirina e ibuprofeno. Estos medicamentos pueden tener un efecto anticoagulante en la [place]. No tome estos medicamentos antes del procedimiento si su mdico le indica que no lo haga.  Pueden indicarle un antibitico para ayudar a prevenir infecciones. Si es as, tome los [employer] se lo haya indicado el mdico. Instrucciones generales  Pueden extraerle Truddie Coco de [place].  Le indicarn que se duche con un jabn antisptico.  Pregntele al mdico cmo se Scientist, clinical (histocompatibility and immunogenetics) o se Museum/gallery curator de la Leisure centre manager.  Haga que alguien lo lleve a su casa desde el hospital o la clnica. Esto es muy importante si le dan un sedante.  Si se ir a su casa inmediatamente despus del procedimiento, planifique que alguien se quede con usted durante 24horas. Qu ocurre durante el procedimiento?   Es posible que se recueste de costado con las rodillas flexionadas, o bien que se siente con la cabeza [employer]  una almohada que tendr [employer]. ? La posicin depende de la edad y la  contextura. ? Se lo colocar en una posicin que permita que los [employer] huesos de la columna (vrtebras) se separen todo lo posible. Esta posicin facilitar la introduccin de [employer] las vrtebras hasta el conducto raqudeo.  Se limpiar la piel de la parte inferior de la espalda (regin lumbar).  Le administrarn una inyeccin para adormecer la parte inferior de la espalda (anestesia local).  Pueden administrarle analgsicos o un sedante.  Luego, se insertar una pequea aguja en la parte inferior de la espalda, hasta que ingrese en el espacio que contiene el lquido cefalorraqudeo. La aguja no ingresar en la mdula espinal.  El lquido se recolectar en tubos. Se enviar al laboratorio para ser examinado.  Se retirar la aguja y se colocar un apsito (vendaje) en la zona. Este procedimiento puede variar segn el mdico y el hospital. Sander Nephew sucede despus del procedimiento?  Le controlarn la presin arterial, la frecuencia cardaca, la frecuencia respiratoria y Retail buyer de oxgeno en la sangre hasta que haya desaparecido el efecto de los medicamentos administrados.  Es posible que Patent attorney recostado un rato.  Tendr que beber mucho lquido y cafena para evitar sufrir una cefalea.  No conduzca durante 24horas si le administraron un sedante. Resumen  La puncin lumbar es un procedimiento que se realiza para extraer una pequea cantidad de lquido cefalorraqudeo (LCR). Se realiza para ayudar a diagnosticar una amplia variedad de enfermedades.  Antes del procedimiento, informe a su mdico todos los medicamentos que [place], incluso vitaminas, hierbas, gotas oftlmicas, cremas y medicamentos de venta libre.  Antes del procedimiento, hable con su mdico acerca de cambiar o suspender los medicamentos que toma habitualmente. Esto es especialmente importante si toma anticoagulantes.  Se anestesiar la parte inferior de la espalda con una inyeccin antes de  la insercin de la aguja en el conducto raqudeo.  Despus del procedimiento, permanecer recostado un tiempo y beber mucha cantidad de lquidos. Esta informacin no tiene Marine scientist el consejo del mdico. Asegrese de hacerle al mdico cualquier pregunta que tenga. Document Revised: 03/18/2017 Document Reviewed: 03/18/2017 Elsevier Patient Education  [place]. 1.

## 2020-03-14 LAB — CSF CELL COUNT WITH DIFFERENTIAL
Basophils, %: 0 %
Lymphs, CSF: 84 % — ABNORMAL HIGH (ref 40–80)
Monocyte/Macrophage: 16 % (ref 15–45)
RBC Count, CSF: 0 cells/uL
Segmented Neutrophils-CSF: 0 % (ref 0–6)
WBC, CSF: 18 cells/uL — ABNORMAL HIGH (ref 0–5)

## 2020-03-14 LAB — PROTEIN, CSF: Total Protein, CSF: 101 mg/dL — ABNORMAL HIGH (ref 15–45)

## 2020-03-14 LAB — CRYPTOCOCCAL AG, LTX SCR RFLX TITER
Cryptococcal Ag Screen: NOT DETECTED
MICRO NUMBER:: 10646731
SPECIMEN QUALITY:: ADEQUATE

## 2020-03-14 LAB — VDRL, CSF: VDRL Quant, CSF: NONREACTIVE

## 2020-03-14 LAB — JC POLYOMA VIRUS DNA, QUANTITATIVE, REAL-TIME PCR, CSF: JC POLYOMA VIRUS DNA, QN REAL TIME PCR, CSF: <500 copies/mL (ref <500)

## 2020-03-14 LAB — GLUCOSE, CSF: Glucose, CSF: 58 mg/dL (ref 40–80)

## 2020-03-18 ENCOUNTER — Other Ambulatory Visit: Payer: Self-pay | Admitting: Infectious Disease

## 2020-03-18 DIAGNOSIS — B20 Human immunodeficiency virus [HIV] disease: Secondary | ICD-10-CM

## 2020-03-19 ENCOUNTER — Telehealth: Payer: Self-pay | Admitting: *Deleted

## 2020-03-19 NOTE — Telephone Encounter (Signed)
Called interpreter services and spoke with interpreter ID [id number]. She LVM for Tressie Ellis (on [place]) relaying per Dr. Felecia Shelling note: "the spinal fluid did not show the JC virus infection that we were most concerned about.".

## 2020-03-19 NOTE — Telephone Encounter (Signed)
-----   Message from Britt Bottom, MD sent at 03/19/2020  2:42 PM EDT ----- Please let the patient or family know that the spinal fluid did not show the JC virus infection that we were most concerned about.

## 2020-03-21 ENCOUNTER — Telehealth: Payer: Self-pay

## 2020-03-21 NOTE — Telephone Encounter (Signed)
If the patient is still suffering from significant cognitive or neurological defects that are worsening or not sufficiently improving  then I would consider a trial of steroids. I cannot make such a decision from a distance as  I am getting ready to leave town and not be back in  the clinic for another month. Can you please schedule Thomas Park with one of my partners and have them call me to discuss. I think that there is a choice between doing nothing and steroids. Only other options would be referral to tertiary care center such as [facility] or [facility] or [facility] but I personally think it would be easier  to just observe or do steroids in monitored setting. I dont think this conversation can be had over the phone. I reallly think they need to be seeen in the clinic

## 2020-03-21 NOTE — Telephone Encounter (Signed)
Patient's sister called office today to inform MD that spinal fluid back name normal. Would like to know what next steps would be. Patient has spoken with Neurology team regarding results. [place]

## 2020-03-22 NOTE — Telephone Encounter (Signed)
Reached out to patient; sister states that patient is still the same since last seen. Only change is patient's eyes are red. Will schedule follow up appointment with provider with earliest opening. Is scheduled with Dr. Linus Salmons on 7/28. Patient's sister will call office if any additional concerns arise. Thomas Park

## 2020-03-26 ENCOUNTER — Ambulatory Visit: Payer: Self-pay

## 2020-03-26 ENCOUNTER — Ambulatory Visit: Payer: Self-pay | Admitting: Infectious Disease

## 2020-04-11 ENCOUNTER — Ambulatory Visit (INDEPENDENT_AMBULATORY_CARE_PROVIDER_SITE_OTHER): Payer: Self-pay | Admitting: Internal Medicine

## 2020-04-11 ENCOUNTER — Inpatient Hospital Stay (HOSPITAL_COMMUNITY): Payer: Self-pay

## 2020-04-11 ENCOUNTER — Other Ambulatory Visit: Payer: Self-pay

## 2020-04-11 ENCOUNTER — Ambulatory Visit: Payer: Self-pay | Admitting: Neurology

## 2020-04-11 ENCOUNTER — Encounter: Payer: Self-pay | Admitting: Internal Medicine

## 2020-04-11 ENCOUNTER — Inpatient Hospital Stay (HOSPITAL_COMMUNITY)
Admission: AD | Admit: 2020-04-11 | Discharge: 2020-04-21 | DRG: 976 | Disposition: A | Discharge status: Home Health | Payer: Self-pay | Source: Ambulatory Visit | Attending: Internal Medicine | Admitting: Internal Medicine

## 2020-04-11 DIAGNOSIS — D72829 Elevated white blood cell count, unspecified: Secondary | ICD-10-CM | POA: Diagnosis not present

## 2020-04-11 DIAGNOSIS — Z882 Allergy status to sulfonamides status: Secondary | ICD-10-CM

## 2020-04-11 DIAGNOSIS — B20 Human immunodeficiency virus [HIV] disease: Secondary | ICD-10-CM | POA: Diagnosis present

## 2020-04-11 DIAGNOSIS — H509 Unspecified strabismus: Secondary | ICD-10-CM | POA: Diagnosis not present

## 2020-04-11 DIAGNOSIS — R9082 White matter disease, unspecified: Secondary | ICD-10-CM

## 2020-04-11 DIAGNOSIS — F419 Anxiety disorder, unspecified: Secondary | ICD-10-CM | POA: Diagnosis present

## 2020-04-11 DIAGNOSIS — H532 Diplopia: Secondary | ICD-10-CM | POA: Diagnosis not present

## 2020-04-11 DIAGNOSIS — H538 Other visual disturbances: Secondary | ICD-10-CM

## 2020-04-11 DIAGNOSIS — Z6833 Body mass index (BMI) 33.0-33.9, adult: Secondary | ICD-10-CM

## 2020-04-11 DIAGNOSIS — G0490 Encephalitis and encephalomyelitis, unspecified: Secondary | ICD-10-CM | POA: Diagnosis present

## 2020-04-11 DIAGNOSIS — R000 Tachycardia, unspecified: Secondary | ICD-10-CM | POA: Diagnosis not present

## 2020-04-11 DIAGNOSIS — Z79899 Other long term (current) drug therapy: Secondary | ICD-10-CM

## 2020-04-11 DIAGNOSIS — G9340 Encephalopathy, unspecified: Secondary | ICD-10-CM

## 2020-04-11 DIAGNOSIS — R270 Ataxia, unspecified: Secondary | ICD-10-CM | POA: Diagnosis present

## 2020-04-11 DIAGNOSIS — R4189 Other symptoms and signs involving cognitive functions and awareness: Secondary | ICD-10-CM | POA: Diagnosis present

## 2020-04-11 DIAGNOSIS — G9349 Other encephalopathy: Principal | ICD-10-CM | POA: Diagnosis present

## 2020-04-11 DIAGNOSIS — B582 Toxoplasma meningoencephalitis: Secondary | ICD-10-CM | POA: Diagnosis present

## 2020-04-11 DIAGNOSIS — Z20822 Contact with and (suspected) exposure to covid-19: Secondary | ICD-10-CM | POA: Diagnosis present

## 2020-04-11 DIAGNOSIS — I10 Essential (primary) hypertension: Secondary | ICD-10-CM | POA: Diagnosis present

## 2020-04-11 DIAGNOSIS — C469 Kaposi's sarcoma, unspecified: Secondary | ICD-10-CM | POA: Diagnosis present

## 2020-04-11 LAB — COMPREHENSIVE METABOLIC PANEL
ALT: 33 U/L (ref 0–44)
AST: 31 U/L (ref 15–41)
Albumin: 3.7 g/dL (ref 3.5–5.0)
Alkaline Phosphatase: 99 U/L (ref 38–126)
Anion gap: 8 (ref 5–15)
BUN: 11 mg/dL (ref 6–20)
CO2: 23 mmol/L (ref 22–32)
Calcium: 8.7 mg/dL — ABNORMAL LOW (ref 8.9–10.3)
Chloride: 103 mmol/L (ref 98–111)
Creatinine, Ser: 0.74 mg/dL (ref 0.61–1.24)
GFR calc Af Amer: >60 mL/min (ref >60)
GFR calc non Af Amer: >60 mL/min (ref >60)
Glucose, Bld: 119 mg/dL — ABNORMAL HIGH (ref 70–99)
Potassium: 3.7 mmol/L (ref 3.5–5.1)
Sodium: 134 mmol/L — ABNORMAL LOW (ref 135–145)
Total Bilirubin: 0.6 mg/dL (ref 0.3–1.2)
Total Protein: 7.1 g/dL (ref 6.5–8.1)

## 2020-04-11 LAB — CBC WITH DIFFERENTIAL/PLATELET
Abs Immature Granulocytes: 0.2 10*3/uL — ABNORMAL HIGH (ref 0.00–0.07)
Basophils Absolute: 0.1 10*3/uL (ref 0.0–0.1)
Basophils Relative: 1 %
Eosinophils Absolute: 0.3 10*3/uL (ref 0.0–0.5)
Eosinophils Relative: 3 %
HCT: 42.8 % (ref 39.0–52.0)
Hemoglobin: 14.4 g/dL (ref 13.0–17.0)
Immature Granulocytes: 2 %
Lymphocytes Relative: 32 %
Lymphs Abs: 3 10*3/uL (ref 0.7–4.0)
MCH: 30.9 pg (ref 26.0–34.0)
MCHC: 33.6 g/dL (ref 30.0–36.0)
MCV: 91.8 fL (ref 80.0–100.0)
Monocytes Absolute: 1 10*3/uL (ref 0.1–1.0)
Monocytes Relative: 10 %
Neutro Abs: 4.7 10*3/uL (ref 1.7–7.7)
Neutrophils Relative %: 52 %
Platelets: 338 10*3/uL (ref 150–400)
RBC: 4.66 MIL/uL (ref 4.22–5.81)
RDW: 13.4 % (ref 11.5–15.5)
WBC: 9.2 10*3/uL (ref 4.0–10.5)
nRBC: 0 % (ref 0.0–0.2)

## 2020-04-11 LAB — SARS CORONAVIRUS 2 BY RT PCR (HOSPITAL ORDER, PERFORMED IN ~~LOC~~ HOSPITAL LAB): SARS Coronavirus 2: NEGATIVE

## 2020-04-11 MED ORDER — ONDANSETRON HCL 4 MG/2ML IJ SOLN: 4.0000 mg | Freq: Four times a day (QID) | INTRAMUSCULAR | Status: DC | PRN

## 2020-04-11 MED ORDER — SODIUM CHLORIDE 0.9% FLUSH
3.0000 mL | INTRAVENOUS | Status: DC | PRN
  Administered 2020-04-12: 3 mL via INTRAVENOUS

## 2020-04-11 MED ORDER — ACETAMINOPHEN 325 MG PO TABS
650.0000 mg | ORAL_TABLET | Freq: Four times a day (QID) | ORAL | Status: DC | PRN
  Filled 2020-04-11: qty 2

## 2020-04-11 MED ORDER — SODIUM CHLORIDE 0.9% FLUSH
3.0000 mL | Freq: Two times a day (BID) | INTRAVENOUS | Status: DC
  Administered 2020-04-11 – 2020-04-21 (×15): 3 mL via INTRAVENOUS

## 2020-04-11 MED ORDER — SODIUM CHLORIDE 0.9 % IV SOLN
250.0000 mL | INTRAVENOUS | Status: DC | PRN
  Administered 2020-04-17: 250 mL via INTRAVENOUS

## 2020-04-11 MED ORDER — DEXAMETHASONE SODIUM PHOSPHATE 4 MG/ML IJ SOLN
4.0000 mg | INTRAMUSCULAR | Status: DC
  Administered 2020-04-11 – 2020-04-12 (×3): 4 mg via INTRAVENOUS
  Filled 2020-04-11 (×2): qty 1

## 2020-04-11 MED ORDER — CLINDAMYCIN HCL 300 MG PO CAPS
600.0000 mg | ORAL_CAPSULE | Freq: Three times a day (TID) | ORAL | Status: DC
  Administered 2020-04-12 – 2020-04-20 (×24): 600 mg via ORAL
  Filled 2020-04-11 (×28): qty 2

## 2020-04-11 MED ORDER — ENOXAPARIN SODIUM 40 MG/0.4ML ~~LOC~~ SOLN
40.0000 mg | SUBCUTANEOUS | Status: DC
  Administered 2020-04-12 – 2020-04-13 (×2): 40 mg via SUBCUTANEOUS
  Filled 2020-04-11 (×2): qty 0.4

## 2020-04-11 MED ORDER — ONDANSETRON HCL 4 MG PO TABS: 4.0000 mg | ORAL_TABLET | Freq: Four times a day (QID) | ORAL | Status: DC | PRN

## 2020-04-11 MED ORDER — ACETAMINOPHEN 650 MG RE SUPP: 650.0000 mg | Freq: Four times a day (QID) | RECTAL | Status: DC | PRN

## 2020-04-11 MED ORDER — LEUCOVORIN CALCIUM 25 MG PO TABS
25.0000 mg | ORAL_TABLET | Freq: Every day | ORAL | Status: DC
  Administered 2020-04-12 – 2020-04-20 (×9): 25 mg via ORAL
  Filled 2020-04-11 (×9): qty 1

## 2020-04-11 MED ORDER — BICTEGRAVIR-EMTRICITAB-TENOFOV 50-200-25 MG PO TABS
1.0000 | ORAL_TABLET | Freq: Every day | ORAL | Status: DC
  Administered 2020-04-12 – 2020-04-21 (×10): 1 via ORAL
  Filled 2020-04-11 (×10): qty 1

## 2020-04-11 MED ORDER — PYRIMETHAMINE 25 MG PO TABS
50.0000 mg | ORAL_TABLET | Freq: Every day | ORAL | Status: DC
  Administered 2020-04-12 – 2020-04-20 (×9): 50 mg via ORAL
  Filled 2020-04-11 (×11): qty 2

## 2020-04-11 MED ORDER — DAPSONE 100 MG PO TABS
100.0000 mg | ORAL_TABLET | Freq: Every day | ORAL | Status: DC
  Administered 2020-04-12 – 2020-04-20 (×9): 100 mg via ORAL
  Filled 2020-04-11 (×9): qty 1

## 2020-04-11 NOTE — Progress Notes (Signed)
° °  Subjective:    Patient ID: Thomas Park, male    DOB: Sep 25, 1990, 29 y.o.   MRN: [id number]  HPI He is here for follow up of neurologic decline. He has a history of HIV, previous AIDS and has had issues with bilateral leg weakness, ataxia and recent MRI with new extensive confluent T2 hyperintensity throughout the white matter.  This is somewhat a surprise with his good immune reconstitution.  He continues to have issues with his ataxia.  No improvement overall.     Review of Systems  Constitutional: Negative for fatigue.  Gastrointestinal: Negative for diarrhea and nausea.  Skin: Negative for rash.       Objective:   Physical Exam Eyes:     General: No scleral icterus. Skin:    Findings: No rash.  Neurological:     Mental Status: He is alert.     Comments: Ataxia at baseline   SH: no tobacco        Assessment & Plan:

## 2020-04-11 NOTE — Progress Notes (Signed)
Note when LP done please perform  LARGE VOLUME LP  Obtain at least 24-32 cv CSF  CSF for repeat JC virus PCR CSF for CMV PCR  CSF for cytology w flow   CSF WHICH we will need to send to [facility] lab to test for HIV RNA on CSF and resistance testing   Latter specimen will likely have to be frozen promptly prior to transport   I have connected Amanda Pea from [place] lab w [place] lab  Please alert me when CSF obtained

## 2020-04-11 NOTE — Assessment & Plan Note (Signed)
Really unclear etiology.  I have discussed this with Dr. Tommy Medal, outside ID providers at academic institutions and we have agreed that will get him started on IV steroids and continue with further work up including a repeat LP. He will need to be admitted for this.  30 minutes spent on the visit.

## 2020-04-11 NOTE — H&P (Addendum)
[facility] History and Physical  Thomas Park TOI:[id number] DOB: [birth year] DOA: 04/11/2020   PCP: Thomas Park, Thomas Islam, MD  Specialists: Has been seen by Thomas Park with neurology  Chief Complaint: Difficulty walking  HPI: Thomas Park is a 29 y.o. 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  Patient's main concern is ataxia which does not appear to be improving.  Patient has also been noted to have cognitive deficits which has been's progressively getting worse.  Patient was seen by neurology, Thomas Park about a month ago.  He had MRI brain in June.  He had an LP done end of June which ruled out JC virus.  Cryptococcal antigen was negative, VDRL was negative as well.  Due to lack of improvement and perhaps some worsening patient was directly admitted to the hospital for further evaluation.  Patient is accompanied by his sister.  Neither of them speak [place].  Interpreter services were utilized.  Patient denies any headaches.  No vision disturbances currently.  No fever or chills recently.  No nausea vomiting.  He just mentions that he has been having difficulty walking for the last year and a half.  According to sister he has also been having some memory issues.  Recommendation was for the patient to be admitted and start steroids and to repeat MRI brain and to repeat LP.  Home Medications: Prior to Admission medications   Medication Sig Start Date End Date Taking? Authorizing Provider  acetaminophen (TYLENOL) 325 MG tablet Take 2 tablets (650 mg total) by mouth every 6 (six) hours as needed for mild pain (or Fever >/= 101). Patient not taking: Reported on 04/11/2020 08/18/18   Shelly Coss, MD  BIKTARVY 50-200-25 MG TABS tablet TAKE 1 TABLET BY MOUTH DAILY 08/31/19   Carlyle Basques, MD  clindamycin  (CLEOCIN) 300 MG capsule TAKE 2 CAPSULES BY MOUTH THREE TIMES DAILY 12/22/19   Thomas Park, Thomas Islam, MD  dapsone 100 MG tablet TAKE 1 TABLET(100 MG) BY MOUTH DAILY 03/19/20   Thomas Park, Thomas Islam, MD  DARAPRIM 25 MG tablet TAKE 2 TABLETS BY MOUTH DAILY WITH BREAKFAST 02/13/20   Thomas Park, Thomas Islam, MD  fluconazole (DIFLUCAN) 100 MG tablet TAKE 1 TABLET(100 MG) BY MOUTH DAILY Patient not taking: Reported on 04/11/2020 03/19/20   Thomas Park, Thomas Islam, MD  gabapentin (NEURONTIN) 100 MG capsule Take 1 capsule (100 mg total) by mouth 2 (two) times daily between meals as needed. Patient not taking: Reported on 04/11/2020 09/21/18   Thomas Park, Thomas Islam, MD  leucovorin [facility]) 25 MG tablet TAKE 1 TABLET BY MOUTH DAILY 02/13/20   Thomas Park, Thomas Islam, MD  ondansetron (ZOFRAN) 4 MG tablet TAKE 1 TABLET BY MOUTH EVERY 8 HOURS AS NEEDED FOR NAUSEA AND VOMITING Patient not taking: Reported on 04/11/2020 02/20/20   Thayer Headings, MD    Allergies:  Allergies  Allergen Reactions  . Sulfa Antibiotics Rash    Past Medical History: Past Medical History:  Diagnosis Date  . Anemia    Thomas Park 08/10/2018  . Dysphagia 02/06/2020  . HIV (human immunodeficiency virus infection) ([place])    dx'd 07/2018  . 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 Neuropathy 09/21/2018  . 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  [place]) 09/21/2018  . Right sided weakness 09/21/2018  . Tongue lesion 09/21/2018  . 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 NO PAST SURGERIES      Social History: Lives with family.  No history of smoking alcohol use illicit drug use.  Family History: Patient and his sister denies any health problems in the family.  Review of Systems - History obtained from the patient General ROS: negative Psychological ROS: negative Ophthalmic ROS: negative ENT ROS: negative Allergy and Immunology ROS:  negative Hematological and Lymphatic ROS: negative Endocrine ROS: negative Respiratory ROS: no cough, shortness of breath, or wheezing Cardiovascular ROS: no chest pain or dyspnea on exertion Gastrointestinal ROS: no abdominal pain, change in bowel habits, or black or bloody stools Genito-Urinary ROS: no dysuria, trouble voiding, or hematuria Musculoskeletal ROS: negative Neurological ROS: Difficulty walking Dermatological ROS: negative  Physical Examination  Pulse rate 100.  Blood pressure 148/88.  Saturation 94% on room air.  Afebrile  General appearance: alert, cooperative, appears stated age and no distress Head: Normocephalic, without obvious abnormality, atraumatic Eyes: Mild redness noted in the left eye.  Pupils equal.  Extraocular movement intact Throat: lips, mucosa, and tongue normal; teeth and gums normal Neck: no adenopathy, no carotid bruit, no JVD, supple, symmetrical, trachea midline and thyroid not enlarged, symmetric, no tenderness/mass/nodules Resp: clear to auscultation bilaterally Cardio: regular rate and rhythm, S1, S2 normal, no murmur, click, rub or gallop GI: soft, non-tender; bowel sounds normal; no masses,  no organomegaly Extremities: extremities normal, atraumatic, no cyanosis or edema Pulses: 2+ and symmetric Skin: Skin color, texture, turgor normal. No rashes or lesions Lymph nodes: Cervical, supraclavicular, and axillary nodes normal. Neurologic: Cranial nerves II to XII intact.  Motor strength equal bilateral upper and lower extremity.  No pronator drift.    Labs on Admission: Patient was a direct admit.  Labs are all pending at this time.    Problem List  Active Problems:   AIDS ([place])   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[place])   White matter abnormality on MRI of brain   Cognitive decline   Assessment: This is a 29 year old male with a history of HIV AIDS and CNS toxoplasmosis with concern for IRIS who has had ataxia and cognitive decline due to his  disease.  Patient has been admitted for intravenous steroids and further evaluation.  Plan:  1. Progressive ataxia in the setting of CNS toxoplasmosis and concern for IRIS: Patient seen by neurology recently in the outpatient setting.  Patient followed at the ID clinic.  Discussed with Dr. Baxter Flattery this evening.  MRI brain will be repeated.  Discussed with neurology who will arrange for LP tomorrow morning.  Dexamethasone 4 mg every 4 hours will be initiated.  Blood work will be done.  Patient may benefit from PT and OT evaluation.  2.  HIV/AIDS: Continue with his antiretroviral treatment, Biktarvy. CD4 was 291 in May.  3.  CNS toxoplasmosis: Patient noted to be on clindamycin, dapsone, leucovorin and pyrimethamine which will be continued.  DVT Prophylaxis: Lovenox Code Status: Full code Family Communication: Discussed with the patient and his sister Disposition: Hopefully return home in improved Consults called: ID has been notified.  Neurology notified regarding need for LP.   Admission Status: Status is: Inpatient  Remains inpatient appropriate because:Ongoing diagnostic testing needed not appropriate for outpatient work up and IV treatments appropriate due to intensity of illness or inability to take PO   Dispo: The patient is from:  Home              Anticipated d/c is to: Home              Anticipated d/c date is: 3 days              Patient currently is not medically stable to d/c.      Severity of Illness: The appropriate patient status for this patient is INPATIENT. Inpatient status is judged to be reasonable and necessary in order to provide the required intensity of service to ensure the patient's safety. The patient's presenting symptoms, physical exam findings, and initial radiographic and laboratory data in the context of their chronic comorbidities is felt to place them at high risk for further clinical deterioration. Furthermore, it is not anticipated that the patient will  be medically stable for discharge from the hospital within 2 midnights of admission. The following factors support the patient status of inpatient.   " The patient's presenting symptoms include difficulty walking. " The worrisome physical exam findings include ataxia. " The initial radiographic and laboratory data are worrisome because of CNS toxoplasmosis and concern for IRIS. " The chronic co-morbidities include HIV/AIDS.   * I certify that at the point of admission it is my clinical judgment that the patient will require inpatient hospital care spanning beyond 2 midnights from the point of admission due to high intensity of service, high risk for further deterioration and high frequency of surveillance required.*   Further management decisions will depend on results of further testing and patient's response to treatment.   Tyisha Cressy [employer]  [facility] operational officer on [email or web address]  04/11/2020, 7:29 PM

## 2020-04-12 ENCOUNTER — Inpatient Hospital Stay (HOSPITAL_COMMUNITY): Payer: Self-pay

## 2020-04-12 ENCOUNTER — Encounter (HOSPITAL_COMMUNITY): Payer: Self-pay | Admitting: Internal Medicine

## 2020-04-12 MED ORDER — GADOBUTROL 1 MMOL/ML IV SOLN
10.0000 mL | Freq: Once | INTRAVENOUS | Status: AC | PRN
  Administered 2020-04-12: 10 mL via INTRAVENOUS

## 2020-04-12 NOTE — Progress Notes (Signed)
Per radiology they request for the patient to have a lumbar puncture at bedside first,. Neurology provider Cheral Marker was page.

## 2020-04-12 NOTE — Consult Note (Signed)
Date of Admission:  04/11/2020          Reason for Consult:  Ataxia and unexplained white extensive matter lesions in CNS in patient with hx of HIV/AIDS who has responded to teratm Referring Dr. Sloan Leiter    Assessment:   1. Extensive confluent T2/FLAIR hyperintensity throughout the cerebral white matter that is not c/w trajectory of his HIV virus and his immune system 2. Hx of Toxoplasmosis including cephalitis diagnosed in November 2019 that has responded clinically and radiographically to therapy 3. HIV and AIDS which has responded also to therapy with suppression of his virus and reconstitution of his CD4 count numerically  Plan:  1. 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 After lumbar puncture has been performed initiate Solu-Medrol 500 mg every 12 hours  3.  After LP add Doravirine to his Biktarvy  4. Ask if ophthalmology can see him early in the week next week to perform a dilated funduscopic exam  5. Check with Neurology ? Could be MS other tests to run  6. Monitor response to therapy  7. Repeat his HIV RNA in blood and CD4  8. Continue his Toxoplamosis medications for now  9. PT/OT  Active Problems:   AIDS ([place])   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[place])   White matter abnormality on MRI of brain   Cognitive decline   Scheduled Meds: . bictegravir-emtricitabine-tenofovir AF  1 tablet Oral Daily  . clindamycin  600 mg Oral TID  . dapsone  100 mg Oral Daily  . enoxaparin (LOVENOX) injection  40 mg Subcutaneous Q24H  . leucovorin  25 mg Oral Daily  . pyrimethamine  50 mg Oral Q breakfast  .  sodium chloride flush  3 mL Intravenous Q12H   Continuous Infusions: . sodium chloride     PRN Meds:.sodium chloride, acetaminophen **OR** acetaminophen, ondansetron **OR** ondansetron (ZOFRAN) IV, sodium chloride flush  HPI: Franklin is a 29 y.o. 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  During that hospitalization he was treated with IV Bactrim but developed intense pruritus.  He also had similar symptoms with some challenges sulfadiazine and pyrimethamine.  He was changed ultimately over to clindamycin and pyrimethamine with leucovorin.  He was also initiated on Biktarvy.  I first met him he was floridly confused and completely unable to walk.  Over time his toxoplasmosis responded nicely to therapy and his HIV became rapidly controlled albeit imperfectly suppressed with low level viremia 100-200 range.  He continued to improve neurologically was able to walk and converse and put on weight.  Viral load became better suppressed and CD4 count has been above 200 January 2021 as high as 297 in May.  When I saw him in late May 2021 his family and he recounted to me that he had suddenly developed ataxia that had previously disappeared.  We obtained an MRI of the brain which showed:  New extensive confluent T2 hyperintensity throughout most of the white matter of the cerebral hemispheres, without evidence of  restricted diffusion, contrast enhancement or significant mass effect, suggestive of HIV encephalopathy.  His CNS toxoplasma lesions had improved  I referred him to Neurology and discussed the case with his outpaitent Neurologist.   Outpatient lumbar puncture was performed and CSF sent for various studies including JC virus which was negative.  CSF was otherwise unremarkable.  He is continue to have ataxia without worsening but without improvement.  The diagnosis of typical HIV encephalopathy did  not make sense to me given the fact that his virus has been nicely controlled in his blood.  The idea that he would have PML seems very strange to in the context of a reconstituted immune system.  I had entertained the idea of immune reconstitution syndrome in response to JC virus.  I had consulted via email with my mentor at [facility], MD, with Charolotte Capuchin MD at [facility] who has especial expertise in HIV and Neurological disorders, along with Lubertha Basque, MD at the [facility] and [facility].   Most felt Iris to seem unlikely given the description of his MRIs without significant inflammatory changes.  Several possible diagnoses were entertained including CD8 mediated encephalitis, HIV " escape" with HIV in the CNS reservoir having acquired potential resistance to his regimen and replicating and causing pathology there locally.  Other opportunistic infection such as CMV were raised  They had recommended LP with CSF to be sent for cytology with flow cytology to see if this might be CD8 mediated encephalitis, to send for CSF JC virus as there have been cases where this virus was culprit and did not show up on initial lumbar punctures, tent sending for CSF for CMV and sending CSF to [facility] so that the spinal fluid can be assayed for HIV quantitatively in the virus analyzed for resistance.  That also recommended dilated funduscopic exam to see if there is anything suggestive for example of CMV pathology.  In the interim it was recommended that patient be started on steroids and   My partner Dr. Megan Salon raised question of whether the patient could have MS when I briefly discussed the case with him.  Patient was seen yesterday by my partner Dr. Linus Salmons and arrangements were made for the patient to be admitted to the hospital so LP can be performed.  It has not yet been performed and I am apprehensive he will not be able to have CSF examined by Pathology and Flow  cytometry.  If it is done at least can send for other studies.  Micro is going to send frozen samples to [facility] on Monday.  I can see that he was started on decadron but stopped it today  If he has LP before path can look at CSF will add Doravirine and debate adding steroids in absence of being able to get that data.  I will discuss with my partner Dr. Baxter Flattery and ask her to check in on him over the weekend.  While he probably meets criteria for stopping his toxoplasmosis therapy with his CD4 count being above 200 for probably at least 6 months I think I had like to keep him on therapy while we get him high-dose corticosteroids      Review of Systems: Review of Systems  Constitutional: Negative for chills, diaphoresis, fever, malaise/fatigue and weight loss.  HENT: Negative for congestion, hearing loss, sore throat and tinnitus.   Eyes: Negative for blurred vision and double vision.  Respiratory: Negative for cough, sputum production, shortness of breath  and wheezing.   Cardiovascular: Negative for chest pain, palpitations and leg swelling.  Gastrointestinal: Negative for abdominal pain, blood in stool, constipation, diarrhea, heartburn, melena, nausea and vomiting.  Genitourinary: Negative for dysuria, flank pain and hematuria.  Musculoskeletal: Negative for back pain, falls, joint pain and myalgias.  Skin: Negative for itching and rash.  Neurological: Positive for weakness. Negative for dizziness, sensory change, focal weakness, loss of consciousness and headaches.  Endo/Heme/Allergies: Does not bruise/bleed easily.  Psychiatric/Behavioral: Negative for depression, memory loss and suicidal ideas. The patient is not nervous/anxious.     Past Medical History:  Diagnosis Date  . Anemia    [name] 08/10/2018  . Dysphagia 02/06/2020  . HIV (human immunodeficiency virus infection) ([place])    dx'd 07/2018  . 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 Kaposi's  disease 09/21/2018  . Neuropathy 09/21/2018  . 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[place]) 09/21/2018  . Right sided weakness 09/21/2018  . Tongue lesion 09/21/2018  . 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 Smoking status: Never Smoker  . Smokeless tobacco: Never Used  Vaping Use  . Vaping Use: Never used  Substance Use Topics  . Alcohol use: Yes    Alcohol/week: 6.0 standard drinks    Types: 6 Cans of beer per week    Comment: 08/10/2018 "drink only on weekend"  . Drug use: Not Currently    Comment: 08/10/2018 "have tried coke; been a long time since I've usd any"    History reviewed. No pertinent family history. Allergies  Allergen Reactions  . Sulfa Antibiotics Rash    OBJECTIVE: Blood pressure (!) 145/120, pulse 73, temperature 97.9 F (36.6 C), temperature source Oral, resp. rate 18, height 5' 5" (1.651 m), weight 91.5 kg, SpO2 93 %.  Physical Exam Constitutional:      Appearance: He is well-developed.  HENT:     Head: Normocephalic and atraumatic.  Eyes:     General:        Right eye: No discharge.        Left eye: No discharge.     Conjunctiva/sclera: Conjunctivae normal.  Cardiovascular:     Rate and Rhythm: Normal rate and regular rhythm.  Pulmonary:     [place]: Pulmonary effort is normal. No respiratory distress.     Breath sounds: No wheezing.  Abdominal:     General: There is no distension.     Palpations: Abdomen is soft.  Musculoskeletal:        General: No tenderness. Normal range of motion.     Cervical back: Normal range of motion and neck supple.  Skin:    General: Skin is warm and dry.     Coloration: Skin is not pale.     Findings: No erythema or rash.  Neurological:     Mental Status: He is alert and oriented to person, place, and time.  Psychiatric:        Mood and Affect: Mood normal.        Behavior: Behavior normal.     Lab Results Lab Results  Component Value Date   WBC 9.2 04/11/2020   HGB 14.4  04/11/2020   HCT 42.8 04/11/2020   MCV 91.8 04/11/2020   PLT 338 04/11/2020    Lab Results  Component Value Date   CREATININE 0.74 04/11/2020   BUN 11 04/11/2020   NA 134 (L) 04/11/2020   K 3.7 04/11/2020   CL 103 04/11/2020  CO2 23 04/11/2020    Lab Results  Component Value Date   ALT 33 04/11/2020   AST 31 04/11/2020   ALKPHOS 99 04/11/2020   BILITOT 0.6 04/11/2020     Microbiology: Recent Results (from the past 240 hour(s))  SARS Coronavirus 2 by RT PCR (hospital order, performed in [facility] hospital lab) Nasopharyngeal Nasopharyngeal Swab     Status: None   Collection Time: 04/11/20 10:11 PM   Specimen: Nasopharyngeal Swab  Result Value Ref Range Status   SARS Coronavirus 2 NEGATIVE NEGATIVE Final    Comment: (NOTE) SARS-CoV-2 target nucleic acids are NOT DETECTED.  The SARS-CoV-2 RNA is generally detectable in upper and lower respiratory specimens during the acute phase of infection. The lowest concentration of SARS-CoV-2 viral copies this assay can detect is 250 copies / mL. A negative result does not preclude SARS-CoV-2 infection and should not be used as the sole basis for treatment or other patient management decisions.  A negative result may occur with improper specimen collection / handling, submission of specimen other than nasopharyngeal swab, presence of viral mutation(s) within the areas targeted by this assay, and inadequate number of viral copies (<250 copies / mL). A negative result must be combined with clinical observations, patient history, and epidemiological information.  Fact Sheet for Patients:   StrictlyIdeas.no  Fact Sheet for Healthcare Providers: BankingDealers.co.za  This test is not yet approved or  cleared by the [place] FDA and has been authorized for detection and/or diagnosis of SARS-CoV-2 by FDA under an Emergency Use Authorization (EUA).  This EUA will remain in effect  (meaning this test can be used) for the duration of the COVID-19 declaration under Section 564(b)(1) of the Act, 21 U.S.C. section 360bbb-3(b)(1), unless the authorization is terminated or revoked sooner.  Performed at [facility], [place] [address], [place], [place] [address]     Alcide Evener, [facility] [phone] pager  04/12/2020, 2:55 PM

## 2020-04-12 NOTE — Progress Notes (Signed)
° ° ° ° °  INFECTIOUS DISEASE ATTENDING ADDENDUM:   Date: 04/12/2020  Patient name: Thomas Park record number: [id number]  Date of birth: 15-Feb-1991   It is clear were not going to build to have a lumbar puncture in time for specimen to be sent to pathology for cytometry and flow cytology given that is now Friday at 4:50 PM.  We can consider waiting until Monday for LP so that this can be properly sent to Pathology and CSF also to [facility].  If LP done over the weekend the CSF to [facility] needs to be frozen as it will not be sent until Monday  Mel Mohr from lab and team aware of this.  His Neuro exam while worse in May vs before then seems stable in the interim  I would NOT be in favor of DC back to home given the trouble we took to bring him in to the hospital.  I will discuss with Dr. Baxter Flattery.   Alcide Evener 04/12/2020, 4:53 PM

## 2020-04-12 NOTE — Progress Notes (Signed)
   04/12/20 1816  Assess: MEWS Score  Temp (!) 97.5 F (36.4 C)  BP (!) 158/98  Pulse Rate (!) 117  Resp 20  Level of Consciousness Alert  SpO2 100 %  O2 Device Room Air  Assess: MEWS Score  MEWS Temp 0  MEWS Systolic 0  MEWS Pulse 2  MEWS RR 0  MEWS LOC 0  MEWS Score 2  MEWS Score Color Yellow  Assess: if the MEWS score is Yellow or Red  Were vital signs taken at a resting state? Yes  Focused Assessment No change from prior assessment  Early Detection of Sepsis Score *See Row Information* Low  MEWS guidelines implemented *See Row Information* No, previously yellow, continue vital signs every 4 hours  Take Vital Signs  Increase Vital Sign Frequency  Yellow: Q 2hr X 2 then Q 4hr X 2, if remains yellow, continue Q 4hrs  Escalate  MEWS: Escalate Yellow: discuss with charge nurse/RN and consider discussing with provider and RRT  Notify: Charge Nurse/RN  Name of Charge Nurse/RN Notified Erin RN  Date Charge Nurse/RN Notified 04/12/20  Time Charge Nurse/RN Notified 1720  Document  Patient Outcome Other (Comment) (pt stable and will continue to monitor vitals)  Progress note created (see row info) Yes  patient is alert and stable and will continue to monitors vital for a Yellow MEWS,due to increased HR 117, and BP 158/98 no complaint of pain or discomfort at this time.

## 2020-04-12 NOTE — Progress Notes (Signed)
PROGRESS NOTE        PATIENT DETAILS Name: Thomas Park Age: 29 y.o. Sex: male Date of Birth: 01-29-1991 Admit Date: 04/11/2020 Admitting Physician Bonnielee Haff, MD PCP:Van Dam, Lavell Islam, MD  Brief Narrative: Patient is a 29 y.o. 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  See below for further details.  Significant events: 7/29>> referred to the ED for admission from infectious disease clinic for evaluation of ataxia/cognitive deficits.  Significant studies: 7/29>> MRI brain: Interval decrease in size of multiple lesions involving the bilateral cerebral hemispheres/brainstem and cerebellum-Constellation of findings suggest continued response to therapy.  HIV encephalopathy  Antimicrobial therapy:   Microbiology data: None  Procedures : None  Consults: Neuro, ID  DVT Prophylaxis : enoxaparin (LOVENOX) injection 40 mg Start: 04/12/20 2200  Subjective: No major issues overnight  Assessment/Plan: Progressive ataxia/cognitive dysfunction: MRI brain as above-neurology planning on LP-await further recommendations from ID.  CNS toxoplasmosis: Continue maintenance medications-remains on clindamycin, dapsone, leucovorin, pyrimethamine.  HIV/AIDS: Remains on antiretrovirals  Morbid Obesity: Estimated body mass index is 33.57 kg/m as calculated from the following:   Height as of this encounter: 5\' 5"  (1.651 m).   Weight as of this encounter: 91.5 kg.    Diet: Diet Order            Diet regular Room service appropriate? Yes; Fluid consistency: Thin  Diet effective now                  Code Status: Full code  Family Communication: Family member at bedside  Disposition Plan: Status is: Inpatient  Remains inpatient appropriate because:Inpatient level of care appropriate due to severity of illness   Dispo: The  patient is from: Home              Anticipated d/c is to: Home              Anticipated d/c date is: 2 days              Patient currently is not medically stable to d/c.  Barriers to Discharge: HIV AIDS with encephalopathy/ataxia-needing inpatient work-up including LP/MRI  Antimicrobial agents: Anti-infectives (From admission, onward)   Start     Dose/Rate Route Frequency Ordered Stop   04/12/20 1000  dapsone tablet 100 mg     Discontinue     100 mg Oral Daily 04/11/20 1901     04/12/20 0800  pyrimethamine (DARAPRIM) tablet 50 mg     Discontinue     50 mg Oral Daily with breakfast 04/11/20 1901     04/11/20 2200  clindamycin (CLEOCIN) capsule 600 mg     Discontinue     600 mg Oral 3 times daily 04/11/20 1901     04/11/20 1915  bictegravir-emtricitabine-tenofovir AF (BIKTARVY) 50-200-25 MG per tablet 1 tablet     Discontinue     1 tablet Oral Daily 04/11/20 1901         Time spent: 25- minutes-Greater than 50% of this time was spent in counseling, explanation of diagnosis, planning of further management, and coordination of care.  MEDICATIONS: Scheduled Meds: . bictegravir-emtricitabine-tenofovir AF  1 tablet Oral Daily  . clindamycin  600 mg Oral TID  . dapsone  100 mg Oral Daily  . dexamethasone (DECADRON) injection  4  mg Intravenous Q4H  . enoxaparin (LOVENOX) injection  40 mg Subcutaneous Q24H  . leucovorin  25 mg Oral Daily  . pyrimethamine  50 mg Oral Q breakfast  . sodium chloride flush  3 mL Intravenous Q12H   Continuous Infusions: . sodium chloride     PRN Meds:.sodium chloride, acetaminophen **OR** acetaminophen, ondansetron **OR** ondansetron (ZOFRAN) IV, sodium chloride flush   PHYSICAL EXAM: Vital signs: Vitals:   04/11/20 2029 04/12/20 0400 04/12/20 0451 04/12/20 0722  BP: (!) 137/84  (!) 133/86 (!) 139/83  Pulse: 80  92 93  Resp: 16  18 17   Temp: 98.9 F (37.2 C)  97.8 F (36.6 C) 98 F (36.7 C)  TempSrc: Oral   Oral  SpO2: 98%  95% 93%  Weight:   91.5 kg    Height:  5\' 5"  (1.651 m)     Filed Weights   04/12/20 0400  Weight: 91.5 kg   Body mass index is 33.57 kg/m.   Gen Exam:Alert awake-not in any distress HEENT:atraumatic, normocephalic Chest: B/L clear to auscultation anteriorly CVS:S1S2 regular Abdomen:soft non tender, non distended Extremities:no edema Neurology: Non focal Skin: no rash  I have personally reviewed following labs and imaging studies  LABORATORY DATA: CBC: Recent Labs  Lab 04/11/20 2004  WBC 9.2  NEUTROABS 4.7  HGB 14.4  HCT 42.8  MCV 91.8  PLT 161    Basic Metabolic Panel: Recent Labs  Lab 04/11/20 2004  NA 134*  K 3.7  CL 103  CO2 23  GLUCOSE 119*  BUN 11  CREATININE 0.74  CALCIUM 8.7*    GFR: Estimated Creatinine Clearance: 140.4 mL/min (by C-G formula based on SCr of 0.74 mg/dL).  Liver Function Tests: Recent Labs  Lab 04/11/20 2004  AST 31  ALT 33  ALKPHOS 99  BILITOT 0.6  PROT 7.1  ALBUMIN 3.7   No results for input(s): LIPASE, AMYLASE in the last 168 hours. No results for input(s): AMMONIA in the last 168 hours.  Coagulation Profile: No results for input(s): INR, PROTIME in the last 168 hours.  Cardiac Enzymes: No results for input(s): CKTOTAL, CKMB, CKMBINDEX, TROPONINI in the last 168 hours.  BNP (last 3 results) No results for input(s): PROBNP in the last 8760 hours.  Lipid Profile: No results for input(s): CHOL, HDL, LDLCALC, TRIG, CHOLHDL, LDLDIRECT in the last 72 hours.  Thyroid Function Tests: No results for input(s): TSH, T4TOTAL, FREET4, T3FREE, THYROIDAB in the last 72 hours.  Anemia Panel: No results for input(s): VITAMINB12, FOLATE, FERRITIN, TIBC, IRON, RETICCTPCT in the last 72 hours.  Urine analysis:    Component Value Date/Time   COLORURINE YELLOW 08/12/2018 1918   APPEARANCEUR CLEAR 08/12/2018 1918   LABSPEC 1.010 08/12/2018 1918   PHURINE 6.0 08/12/2018 1918   GLUCOSEU NEGATIVE 08/12/2018 1918   HGBUR NEGATIVE 08/12/2018 1918    BILIRUBINUR NEGATIVE 08/12/2018 1918   KETONESUR NEGATIVE 08/12/2018 1918   PROTEINUR NEGATIVE 08/12/2018 1918   NITRITE NEGATIVE 08/12/2018 1918   LEUKOCYTESUR NEGATIVE 08/12/2018 1918    Sepsis Labs: Lactic Acid, Venous No results found for: LATICACIDVEN  MICROBIOLOGY: Recent Results (from the past 240 hour(s))  SARS Coronavirus 2 by RT PCR (hospital order, performed in [place] hospital lab) Nasopharyngeal Nasopharyngeal Swab     Status: None   Collection Time: 04/11/20 10:11 PM   Specimen: Nasopharyngeal Swab  Result Value Ref Range Status   SARS Coronavirus 2 NEGATIVE NEGATIVE Final    Comment: (NOTE) SARS-CoV-2 target nucleic acids are NOT DETECTED.  The SARS-CoV-2 RNA is generally detectable in upper and lower respiratory specimens during the acute phase of infection. The lowest concentration of SARS-CoV-2 viral copies this assay can detect is 250 copies / mL. A negative result does not preclude SARS-CoV-2 infection and should not be used as the sole basis for treatment or other patient management decisions.  A negative result may occur with improper specimen collection / handling, submission of specimen other than nasopharyngeal swab, presence of viral mutation(s) within the areas targeted by this assay, and inadequate number of viral copies (<250 copies / mL). A negative result must be combined with clinical observations, patient history, and epidemiological information.  Fact Sheet for Patients:   StrictlyIdeas.no  Fact Sheet for Healthcare Providers: BankingDealers.co.za  This test is not yet approved or  cleared by the [place] FDA and has been authorized for detection and/or diagnosis of SARS-CoV-2 by FDA under an Emergency Use Authorization (EUA).  This EUA will remain in effect (meaning this test can be used) for the duration of the COVID-19 declaration under Section 564(b)(1) of the Act, 21  U.S.C. section 360bbb-3(b)(1), unless the authorization is terminated or revoked sooner.  Performed at [facility], [address] [place], [place] [address]     RADIOLOGY STUDIES/RESULTS: MR BRAIN W WO CONTRAST  Result Date: 04/12/2020 CLINICAL DATA:  Initial evaluation for progressively worsening cognitive defects, ataxia, not improving. History of HIV/aids, CNS toxoplasmosis, on treatment. Last CD4 count = 291 on 02/06/2020. Concern for possible IRIS. EXAM: MRI HEAD WITHOUT AND WITH CONTRAST TECHNIQUE: Multiplanar, multiecho pulse sequences of the brain and surrounding structures were obtained without and with intravenous contrast. CONTRAST:  56mL GADAVIST GADOBUTROL 1 MMOL/ML IV SOLN COMPARISON:  Comparison made with most recent brain MRI from 02/19/2020 as well as previous brain MRI from 08/10/2018. FINDINGS: Brain: Examination moderately degraded by motion artifact. Advanced cerebral atrophy for age, stable from previous. Again seen is fairly extensive confluent T2/FLAIR signal abnormality throughout the cerebral white matter without discernible enhancement or mass effect, most characteristic of HIV encephalopathy. Overall, appearance is similar to perhaps mildly improved from previous. Again seen are multiple scattered T2 hyperintense lesions involving the bilateral basal ganglia, thalami, right temporal occipital region, left dorsal midbrain and pons, and cerebellum, most characteristic of CNS toxoplasmosis infection. In comparison with previous exam, these lesions are stable to perhaps slightly decreased from prior. For example, the largest of these lesions is positioned at the left basal ganglia and measures 15 x 9 mm, previously 16 x 10 mm (series 10, image 13). Again seen are scattered foci of peripheral and nodular enhancement about several of these lesions, most pronounced at the left basal ganglia and right temporal occipital region. Overall, the degree of enhancement appears  slightly decreased and improved from previous. No new significant surrounding edema. No new lesions identified. Mild diffuse pachymeningeal enhancement noted overlying both cerebral convexities, similar to previous, and could be related to prior lumbar punctures. No significant left a meningeal enhancement. No evidence for acute or subacute infarct. Gray-white matter differentiation maintained. No evidence for acute intracranial hemorrhage. No mass effect or midline shift. No hydrocephalus or extra-axial fluid collection. Vascular: Major intracranial vascular flow voids are maintained. Skull and upper cervical spine: Craniocervical junction within normal limits. No focal marrow replacing lesion. No scalp soft tissue abnormality. Sinuses/Orbits: Globes and orbital soft tissues demonstrate no acute finding. Paranasal sinuses are largely clear. No significant mastoid effusion. Other: Non. IMPRESSION: 1. Motion degraded exam. 2. Stable to perhaps  slight interval decrease in size of multiple lesions involving the bilateral cerebral hemispheres, brainstem, and cerebellum, most pronounced of which are positioned about the basal ganglia. Associated ring and nodular enhancement is stable to slightly improved. Constellation of findings suggest continued interval response to therapy. No imaging findings to suggest IRIS. 3. Underlying extensive confluent T2/FLAIR hyperintensity throughout the cerebral white matter, most characteristic of HIV encephalopathy. 4. Advanced atrophy for age. Electronically Signed   By: Jeannine Boga M.D.   On: 04/12/2020 04:29     LOS: 1 day   Oren Binet, MD  [facility]    To contact the attending provider between 7A-7P or the covering provider during after hours 7P-7A, please log into the web site [email or web address] and access using universal [place] password for that web site. If you do not have the password, please call the hospital operator.  04/12/2020, 11:06  AM

## 2020-04-12 NOTE — Plan of Care (Signed)

## 2020-04-12 NOTE — Progress Notes (Signed)
Occupational Therapy Evaluation Patient Details Name: Thomas Park MRN: [id number] DOB: 08-04-1991 Today's Date: 04/12/2020    History of Present Illness 29 y.o. 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 Pt admitted on 7/29 with concern for IRIS, ataxia and memory decline.   Clinical Impression   Patient lives with family and typically requires assistance with mobility and ADLs.  Patient presenting today with decreased balance, coordination, vision, and cognition.  Patient requiring increased cueing for safety as he is impulsive and has trouble sequencing.  Able to stand with min guard though very unsteady walking, requiring min/mod assist and RW.  Patient also presenting with double vision.  Will continue to follow with OT acutely to address the deficits listed below.      Follow Up Recommendations   HHOT, Supervision 24 hours (though family uninsured and cannot cover this -- PT discussed pro bono services)   Equipment Recommendations  Other (comment) (RW)    Recommendations for Other Services       Precautions / Restrictions Precautions Precautions: Fall Restrictions Weight Bearing Restrictions: No      Mobility Bed Mobility Overal bed mobility: Needs Assistance Bed Mobility: Supine to Sit;Sit to Supine     Supine to sit: Supervision Sit to supine: Supervision   General bed mobility comments: for safety, pt moves quickly and with incoordination.  Transfers Overall transfer level: Needs assistance Equipment used: Rolling walker (2 wheeled) Transfers: Sit to/from Stand Sit to Stand: Min guard;From elevated surface              Balance Overall balance assessment: Needs assistance;History of Falls Sitting-balance support: No upper extremity supported Sitting balance-Leahy Scale: Good     Standing balance support: Bilateral upper extremity supported;During functional  activity Standing balance-Leahy Scale: Poor Standing balance comment: ataxic, unsteady, requires RW use and PT min-mod assist to maintain balance                           ADL either performed or assessed with clinical judgement   ADL Overall ADL's : Needs assistance/impaired Eating/Feeding: Independent;Sitting   Grooming: Min guard;Standing   Upper Body Bathing: Min guard;Sitting   Lower Body Bathing: Minimal assistance;Sit to/from stand   Upper Body Dressing : Min guard;Sitting   Lower Body Dressing: Minimal assistance;Sit to/from stand   Toilet Transfer: Minimal assistance;Ambulation;Regular Toilet;RW           Functional mobility during ADLs: Minimal assistance;+2 for safety/equipment;Rolling walker       Vision Baseline Vision/History: No visual deficits Patient Visual Report: Diplopia Vision Assessment?: Vision impaired- to be further tested in functional context Additional Comments: Patient reporting double vision beginning recently.  With R eye ocluded he is able to see normally, L eye ocluded it is blurry     Perception     Praxis      Pertinent Vitals/Pain Pain Assessment: No/denies pain     Hand Dominance Right   Extremity/Trunk Assessment Upper Extremity Assessment Upper Extremity Assessment: RUE deficits/detail;LUE deficits/detail RUE Deficits / Details: Strength nd ROM WFL, though coordination slightly impaired.. Increased tiem for finger to nose RUE Coordination: decreased fine motor LUE Deficits / Details: Strength nd ROM WFL, though coordination slightly impaired.. Increased tiem for finger to nose LUE Coordination: decreased fine motor   Lower Extremity Assessment Lower Extremity Assessment: Defer to PT evaluation  Communication Communication Communication: Prefers language other than English   Cognition Arousal/Alertness: Awake/alert Behavior During Therapy: WFL for tasks assessed/performed Overall Cognitive Status:  Impaired/Different from baseline Area of Impairment: Orientation;Attention;Memory;Following commands;Safety/judgement;Awareness;Problem solving                 Orientation Level: Place;Time;Disoriented to Current Attention Level: Selective Memory: Decreased short-term memory Following Commands: Follows one step commands with increased time;Follows one step commands inconsistently Safety/Judgement: Decreased awareness of safety;Decreased awareness of deficits Awareness: Intellectual Problem Solving: Slow processing;Difficulty sequencing;Requires verbal cues;Requires tactile cues;Decreased initiation General Comments: Language barrier may have added to difficulty, but patient is impulsive and often going to do things bfore listening to instructions from interpreter.  Not oriented to time and place, not able to recall any part of his home address.  Laughing often throughout session even when nothing necessarily funny happening.     General Comments       Exercises     Shoulder Instructions      Home Living Family/patient expects to be discharged to:: Private residence Living Arrangements: Parent;Other (Comment) (sister, father; both work, but pt states he is rarely alone) Available Help at Discharge: Family Type of Home: House Home Access: Stairs to enter Technical brewer of Steps: a few   Home Layout: One level     Bathroom Shower/Tub: Teacher, early years/pre: [place]: Environmental consultant - 2 wheels          Prior Functioning/Environment Level of Independence: Needs assistance  Gait / Transfers Assistance Needed: pt reports using RW "sometimes" at home, reports 2 falls in the past 6 months ADL's / Homemaking Assistance Needed: pt reports needing assist for bathing from father            OT Problem List: Impaired balance (sitting and/or standing);Impaired vision/perception;Decreased coordination;Decreased cognition;Decreased safety  awareness;Decreased knowledge of use of DME or AE      OT Treatment/Interventions: Self-care/ADL training;Therapeutic exercise;Energy conservation;DME and/or AE instruction;Therapeutic activities;Visual/perceptual remediation/compensation;Cognitive remediation/compensation;Patient/family education;Balance training    OT Goals(Current goals can be found in the care plan section) Acute Rehab OT Goals Patient Stated Goal: go home OT Goal Formulation: With patient Time For Goal Achievement: 04/26/20 Potential to Achieve Goals: Good  OT Frequency: Min 2X/week   Barriers to D/C:            Co-evaluation              AM-PAC OT "6 Clicks" Daily Activity     Outcome Measure Help from another person eating meals?: None Help from another person taking care of personal grooming?: A Little Help from another person toileting, which includes using toliet, bedpan, or urinal?: A Little Help from another person bathing (including washing, rinsing, drying)?: A Little Help from another person to put on and taking off regular upper body clothing?: A Little Help from another person to put on and taking off regular lower body clothing?: A Little 6 Click Score: 19   End of Session Equipment Utilized During Treatment: Gait belt;Rolling walker Nurse Communication: Mobility status  Activity Tolerance: Patient tolerated treatment well Patient left: in bed;with call bell/phone within reach;with bed alarm set;with family/visitor present  OT Visit Diagnosis: Unsteadiness on feet (R26.81);Other abnormalities of gait and mobility (R26.89);Repeated falls (R29.6);History of falling (Z91.81);Ataxia, unspecified (R27.0);Other symptoms and signs involving cognitive function                Time: 2426-8341 OT Time Calculation (min): 18 min Charges:  OT General  Charges $OT Visit: 1 Visit OT Evaluation $OT Eval Moderate Complexity: 1 [address], OTR/L   Thomas Park 04/12/2020, 3:21 PM

## 2020-04-12 NOTE — Evaluation (Signed)
Physical Therapy Evaluation Patient Details Name: Thomas Park MRN: [id number] DOB: 07/21/1991 Today's Date: 04/12/2020   History of Present Illness  29 y.o. 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 Pt admitted on 7/29 with concern for IRIS, ataxia and memory decline.  Clinical Impression   Pt presents with impaired coordination, ataxic gait, poor standing balance with history of recent falls, and decreased activity tolerance. Pt to benefit from acute PT to address deficits. Pt ambulated hallway distance requiring min-mod assist for correcting LOB and addressing gait form and safety during hallway navigation. Per pt, he feels much more unsteady now vs at baseline, and pt and family have been trying to get him PT services but limited given lack of insurance. PT recommending [facility]), contact information administered to pt and family. CSW also informed of PT recommendation. PT to progress mobility as tolerated, and will continue to follow acutely.      Follow Up Recommendations Outpatient PT;Supervision for mobility/OOB ([facility] services in [facility] - information administered)    Equipment Recommendations  None recommended by PT    Recommendations for Other Services       Precautions / Restrictions Precautions Precautions: Fall Restrictions Weight Bearing Restrictions: No      Mobility  Bed Mobility Overal bed mobility: Needs Assistance Bed Mobility: Supine to Sit;Sit to Supine     Supine to sit: Supervision Sit to supine: Supervision   General bed mobility comments: for safety, pt moves quickly and with incoordination.  Transfers Overall transfer level: Needs assistance Equipment used: Rolling walker (2 wheeled) Transfers: Sit to/from Stand Sit to Stand: Min guard;From elevated surface          General transfer comment: min guard for safety, pt reaching for RW once standing.  Ambulation/Gait Ambulation/Gait assistance: Min assist;Mod assist Gait Distance (Feet): 250 Feet Assistive device: Rolling walker (2 wheeled) Gait Pattern/deviations: Step-through pattern;Decreased stride length;Wide base of support;Drifts right/left;Ataxic;Staggering right Gait velocity: decr   General Gait Details: Min assist to steady and guide RW, with mod assist to correct R posterolateral LOB x3. Pt holds RLE in PF/inversion and appears to roll ankle multiple times (per father this is how pt typically walks). verbal cuing for upright posture, positioning in RW, turning with keeping RW in close proximity.  Stairs            Wheelchair Mobility    Modified Rankin (Stroke Patients Only)       Balance Overall balance assessment: Needs assistance;History of Falls Sitting-balance support: No upper extremity supported Sitting balance-Leahy Scale: Good     Standing balance support: Bilateral upper extremity supported;During functional activity Standing balance-Leahy Scale: Poor Standing balance comment: ataxic, unsteady, requires RW use and PT min-mod assist to maintain balance                             Pertinent Vitals/Pain Pain Assessment: No/denies pain    Home Living Family/patient expects to be discharged to:: Private residence Living Arrangements: Parent;Other relatives (sister, father; both work, but pt states he is rarely alone) Available Help at Discharge: Family Type of Home: House Home Access: Stairs to enter   [employer] of Steps: a few Home Layout: One level [place]: [place] - 2 wheels      Prior Function Level  of Independence: Needs assistance   Gait / Transfers Assistance Needed: pt reports using RW "sometimes" at home, reports 2 falls in the past 6 months  ADL's / Homemaking Assistance Needed: pt reports needing assist for bathing  from father        Hand Dominance   Dominant Hand: Right    Extremity/Trunk Assessment        Lower Extremity Assessment Lower Extremity Assessment: Generalized weakness;RLE deficits/detail;LLE deficits/detail RLE Deficits / Details: MMT reflects 5/5 hip abd/add, hip flex, knee ext/flex, but functionally weak during gait. Heel to shin [facility] RLE Coordination: decreased gross motor LLE Deficits / Details: MMT reflects 5/5 hip abd/add, hip flex, knee ext/flex, but functionally weak during gait. Heel to shin WFL LLE Coordination: decreased gross motor    Cervical / Trunk Assessment Cervical / Trunk Assessment: Normal  Communication   Communication: Interpreter utilized Roselie Awkward [phone])  Cognition Arousal/Alertness: Awake/alert Behavior During Therapy: WFL for tasks assessed/performed Overall Cognitive Status: Within Functional Limits for tasks assessed                                 General Comments: Increased processing time, but suspect due to use of interpeter. Answers questions appropriately, follows all commands well, and A&Ox4.      General Comments      Exercises     Assessment/Plan    PT Assessment Patient needs continued PT services  PT Problem List Decreased strength;Decreased mobility;Decreased activity tolerance;Decreased balance;Decreased knowledge of use of DME;Decreased coordination       PT Treatment Interventions DME instruction;Therapeutic activities;Gait training;Therapeutic exercise;Patient/family education;Balance training;Stair training;Functional mobility training;Neuromuscular re-education    PT Goals (Current goals can be found in the Care Plan section)  Acute Rehab PT Goals Patient Stated Goal: go home PT Goal Formulation: With patient/family Time For Goal Achievement: 04/26/20 Potential to Achieve Goals: Good    Frequency Min 3X/week   Barriers to discharge        Co-evaluation               AM-PAC PT "6  Clicks" Mobility  Outcome Measure Help needed turning from your back to your side while in a flat bed without using bedrails?: None Help needed moving from lying on your back to sitting on the side of a flat bed without using bedrails?: None Help needed moving to and from a bed to a chair (including a wheelchair)?: A Little Help needed standing up from a chair using your arms (e.g., wheelchair or bedside chair)?: A Little Help needed to walk in hospital room?: A Lot Help needed climbing 3-5 steps with a railing? : A Lot 6 Click Score: 18    End of Session Equipment Utilized During Treatment: Gait belt Activity Tolerance: Patient tolerated treatment well;Patient limited by fatigue Patient left: in bed;with call bell/phone within reach;with family/visitor present Nurse Communication: Mobility status;Other (comment) (pt and family requesting morning meds) PT Visit Diagnosis: Ataxic gait (R26.0);Difficulty in walking, not elsewhere classified (R26.2);Unsteadiness on feet (R26.81)    Time: 4665-9935 PT Time Calculation (min) (ACUTE ONLY): 32 min   Charges:   PT Evaluation $PT Eval Low Complexity: 1 Low PT Treatments $Gait Training: 8-22 mins        Leslieann Whisman E, PT Acute Rehabilitation Services Pager [phone]  Office [phone]   Taysen Bushart D Saraya Tirey 04/12/2020, 9:18 AM

## 2020-04-13 LAB — GLUCOSE, CAPILLARY: Glucose-Capillary: 134 mg/dL — ABNORMAL HIGH (ref 70–99)

## 2020-04-13 NOTE — Progress Notes (Signed)
PROGRESS NOTE        PATIENT DETAILS Name: Thomas Park Age: 29 y.o. Sex: male Date of Birth: Jun 11, 1991 Admit Date: 04/11/2020 Admitting Physician Bonnielee Haff, MD PCP:Van Dam, Lavell Islam, MD  Brief Narrative: Patient is a 29 y.o. 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  See below for further details.  Significant events: 7/29>> referred to the ED for admission from infectious disease clinic for evaluation of ataxia/cognitive deficits.  Significant studies: 7/29>> MRI brain: Interval decrease in size of multiple lesions involving the bilateral cerebral hemispheres/brainstem and cerebellum-Constellation of findings suggest continued response to therapy.  HIV encephalopathy  Antimicrobial therapy:   Microbiology data: None  Procedures : None  Consults: Neuro, ID  DVT Prophylaxis : enoxaparin (LOVENOX) injection 40 mg Start: 04/12/20 2200  Subjective: No major issues overnight  Assessment/Plan: Progressive ataxia/cognitive dysfunction: Etiology unknown-but given HIV-concern for infection with JC virus/CMV/HIV encephalitis etc.  MRI brain as above-neurology advising LP to be done under fluoroscopy-given large volume CSF-and special studies that are needed-ID advising that this be done on 8/2.  Given difficulties to get him to be inpatient-ID advising that he remain inpatient until inpatient work-up is complete.  CNS toxoplasmosis: Continue maintenance medications-remains on clindamycin, dapsone, leucovorin, pyrimethamine.  HIV/AIDS: Remains on antiretrovirals  Morbid Obesity: Estimated body mass index is 33.57 kg/m as calculated from the following:   Height as of this encounter: 5\' 5"  (1.651 m).   Weight as of this encounter: 91.5 kg.    Diet: Diet Order            Diet regular Room service appropriate? No; Fluid  consistency: Thin  Diet effective now                  Code Status: Full code  Family Communication: Family member at bedside  Disposition Plan: Status is: Inpatient  Remains inpatient appropriate because:Inpatient level of care appropriate due to severity of illness   Dispo: The patient is from: Home              Anticipated d/c is to: Home              Anticipated d/c date is: 2 days              Patient currently is not medically stable to d/c.  Barriers to Discharge: HIV AIDS with encephalopathy/ataxia-needing inpatient work-up including LP/MRI  Antimicrobial agents: Anti-infectives (From admission, onward)   Start     Dose/Rate Route Frequency Ordered Stop   04/12/20 1000  dapsone tablet 100 mg     Discontinue     100 mg Oral Daily 04/11/20 1901     04/12/20 0800  pyrimethamine (DARAPRIM) tablet 50 mg     Discontinue     50 mg Oral Daily with breakfast 04/11/20 1901     04/11/20 2200  clindamycin (CLEOCIN) capsule 600 mg     Discontinue     600 mg Oral 3 times daily 04/11/20 1901     04/11/20 1915  bictegravir-emtricitabine-tenofovir AF (BIKTARVY) 50-200-25 MG per tablet 1 tablet     Discontinue     1 tablet Oral Daily 04/11/20 1901         Time spent: 15- minutes-Greater than 50% of this time was spent in counseling,  explanation of diagnosis, planning of further management, and coordination of care.  MEDICATIONS: Scheduled Meds: . bictegravir-emtricitabine-tenofovir AF  1 tablet Oral Daily  . clindamycin  600 mg Oral TID  . dapsone  100 mg Oral Daily  . enoxaparin (LOVENOX) injection  40 mg Subcutaneous Q24H  . leucovorin  25 mg Oral Daily  . pyrimethamine  50 mg Oral Q breakfast  . sodium chloride flush  3 mL Intravenous Q12H   Continuous Infusions: . sodium chloride     PRN Meds:.sodium chloride, acetaminophen **OR** acetaminophen, ondansetron **OR** ondansetron (ZOFRAN) IV, sodium chloride flush   PHYSICAL EXAM: Vital signs: Vitals:   04/12/20  1706 04/12/20 1816 04/12/20 2059 04/13/20 0439  BP: (!) 126/90 (!) 158/98 (!) 139/77 120/72  Pulse: (!) 116 (!) 117 83 93  Resp: 18 20 16 16   Temp: 98.1 F (36.7 C) (!) 97.5 F (36.4 C) 98.3 F (36.8 C) 98.2 F (36.8 C)  TempSrc: Oral Oral Oral Oral  SpO2: 96% 100% 100% 95%  Weight:      Height:       Filed Weights   04/12/20 0400  Weight: 91.5 kg   Body mass index is 33.57 kg/m.   Gen Exam:Alert awake-not in any distress HEENT:atraumatic, normocephalic Chest: B/L clear to auscultation anteriorly CVS:S1S2 regular Abdomen:soft non tender, non distended Extremities:no edema Neurology: Non focal Skin: no rash  I have personally reviewed following labs and imaging studies  LABORATORY DATA: CBC: Recent Labs  Lab 04/11/20 2004  WBC 9.2  NEUTROABS 4.7  HGB 14.4  HCT 42.8  MCV 91.8  PLT 836    Basic Metabolic Panel: Recent Labs  Lab 04/11/20 2004  NA 134*  K 3.7  CL 103  CO2 23  GLUCOSE 119*  BUN 11  CREATININE 0.74  CALCIUM 8.7*    GFR: Estimated Creatinine Clearance: 140.4 mL/min (by C-G formula based on SCr of 0.74 mg/dL).  Liver Function Tests: Recent Labs  Lab 04/11/20 2004  AST 31  ALT 33  ALKPHOS 99  BILITOT 0.6  PROT 7.1  ALBUMIN 3.7   No results for input(s): LIPASE, AMYLASE in the last 168 hours. No results for input(s): AMMONIA in the last 168 hours.  Coagulation Profile: No results for input(s): INR, PROTIME in the last 168 hours.  Cardiac Enzymes: No results for input(s): CKTOTAL, CKMB, CKMBINDEX, TROPONINI in the last 168 hours.  BNP (last 3 results) No results for input(s): PROBNP in the last 8760 hours.  Lipid Profile: No results for input(s): CHOL, HDL, LDLCALC, TRIG, CHOLHDL, LDLDIRECT in the last 72 hours.  Thyroid Function Tests: No results for input(s): TSH, T4TOTAL, FREET4, T3FREE, THYROIDAB in the last 72 hours.  Anemia Panel: No results for input(s): VITAMINB12, FOLATE, FERRITIN, TIBC, IRON, RETICCTPCT in the  last 72 hours.  Urine analysis:    Component Value Date/Time   COLORURINE YELLOW 08/12/2018 1918   APPEARANCEUR CLEAR 08/12/2018 1918   LABSPEC 1.010 08/12/2018 1918   PHURINE 6.0 08/12/2018 1918   GLUCOSEU NEGATIVE 08/12/2018 1918   HGBUR NEGATIVE 08/12/2018 1918   BILIRUBINUR NEGATIVE 08/12/2018 1918   KETONESUR NEGATIVE 08/12/2018 1918   PROTEINUR NEGATIVE 08/12/2018 1918   NITRITE NEGATIVE 08/12/2018 1918   LEUKOCYTESUR NEGATIVE 08/12/2018 1918    Sepsis Labs: Lactic Acid, Venous No results found for: LATICACIDVEN  MICROBIOLOGY: Recent Results (from the past 240 hour(s))  SARS Coronavirus 2 by RT PCR (hospital order, performed in [facility] hospital lab) Nasopharyngeal Nasopharyngeal Swab     Status: None  Collection Time: 04/11/20 10:11 PM   Specimen: Nasopharyngeal Swab  Result Value Ref Range Status   SARS Coronavirus 2 NEGATIVE NEGATIVE Final    Comment: (NOTE) SARS-CoV-2 target nucleic acids are NOT DETECTED.  The SARS-CoV-2 RNA is generally detectable in upper and lower respiratory specimens during the acute phase of infection. The lowest concentration of SARS-CoV-2 viral copies this assay can detect is 250 copies / mL. A negative result does not preclude SARS-CoV-2 infection and should not be used as the sole basis for treatment or other patient management decisions.  A negative result may occur with improper specimen collection / handling, submission of specimen other than nasopharyngeal swab, presence of viral mutation(s) within the areas targeted by this assay, and inadequate number of viral copies (<250 copies / mL). A negative result must be combined with clinical observations, patient history, and epidemiological information.  Fact Sheet for Patients:   StrictlyIdeas.no  Fact Sheet for Healthcare Providers: BankingDealers.co.za  This test is not yet approved or  cleared by the [place] FDA and has  been authorized for detection and/or diagnosis of SARS-CoV-2 by FDA under an Emergency Use Authorization (EUA).  This EUA will remain in effect (meaning this test can be used) for the duration of the COVID-19 declaration under Section 564(b)(1) of the Act, 21 U.S.C. section 360bbb-3(b)(1), unless the authorization is terminated or revoked sooner.  Performed at [facility], [place] [address] [place], [place] [address]     RADIOLOGY STUDIES/RESULTS: MR BRAIN W WO CONTRAST  Result Date: 04/12/2020 CLINICAL DATA:  Initial evaluation for progressively worsening cognitive defects, ataxia, not improving. History of HIV/aids, CNS toxoplasmosis, on treatment. Last CD4 count = 291 on 02/06/2020. Concern for possible IRIS. EXAM: MRI HEAD WITHOUT AND WITH CONTRAST TECHNIQUE: Multiplanar, multiecho pulse sequences of the brain and surrounding structures were obtained without and with intravenous contrast. CONTRAST:  66mL GADAVIST GADOBUTROL 1 MMOL/ML IV SOLN COMPARISON:  Comparison made with most recent brain MRI from 02/19/2020 as well as previous brain MRI from 08/10/2018. FINDINGS: Brain: Examination moderately degraded by motion artifact. Advanced cerebral atrophy for age, stable from previous. Again seen is fairly extensive confluent T2/FLAIR signal abnormality throughout the cerebral white matter without discernible enhancement or mass effect, most characteristic of HIV encephalopathy. Overall, appearance is similar to perhaps mildly improved from previous. Again seen are multiple scattered T2 hyperintense lesions involving the bilateral basal ganglia, thalami, right temporal occipital region, left dorsal midbrain and pons, and cerebellum, most characteristic of CNS toxoplasmosis infection. In comparison with previous exam, these lesions are stable to perhaps slightly decreased from prior. For example, the largest of these lesions is positioned at the left basal ganglia and measures 15 x 9 mm,  previously 16 x 10 mm (series 10, image 13). Again seen are scattered foci of peripheral and nodular enhancement about several of these lesions, most pronounced at the left basal ganglia and right temporal occipital region. Overall, the degree of enhancement appears slightly decreased and improved from previous. No new significant surrounding edema. No new lesions identified. Mild diffuse pachymeningeal enhancement noted overlying both cerebral convexities, similar to previous, and could be related to prior lumbar punctures. No significant left a meningeal enhancement. No evidence for acute or subacute infarct. Gray-white matter differentiation maintained. No evidence for acute intracranial hemorrhage. No mass effect or midline shift. No hydrocephalus or extra-axial fluid collection. Vascular: Major intracranial vascular flow voids are maintained. Skull and upper cervical spine: Craniocervical junction within normal limits. No focal marrow  replacing lesion. No scalp soft tissue abnormality. Sinuses/Orbits: Globes and orbital soft tissues demonstrate no acute finding. Paranasal sinuses are largely clear. No significant mastoid effusion. Other: Non. IMPRESSION: 1. Motion degraded exam. 2. Stable to perhaps slight interval decrease in size of multiple lesions involving the bilateral cerebral hemispheres, brainstem, and cerebellum, most pronounced of which are positioned about the basal ganglia. Associated ring and nodular enhancement is stable to slightly improved. Constellation of findings suggest continued interval response to therapy. No imaging findings to suggest IRIS. 3. Underlying extensive confluent T2/FLAIR hyperintensity throughout the cerebral white matter, most characteristic of HIV encephalopathy. 4. Advanced atrophy for age. Electronically Signed   By: Jeannine Boga M.D.   On: 04/12/2020 04:29     LOS: 2 days   Oren Binet, MD  [facility]    To contact the attending provider  between 7A-7P or the covering provider during after hours 7P-7A, please log into the web site [email or web address] and access using universal [place] password for that web site. If you do not have the password, please call the hospital operator.  04/13/2020, 3:14 PM

## 2020-04-14 DIAGNOSIS — G9340 Encephalopathy, unspecified: Secondary | ICD-10-CM

## 2020-04-14 LAB — HIV-1 RNA QUANT-NO REFLEX-BLD
HIV 1 RNA Quant: 110 copies/mL
LOG10 HIV-1 RNA: 2.041 log10copy/mL

## 2020-04-14 MED ORDER — ENOXAPARIN SODIUM 40 MG/0.4ML ~~LOC~~ SOLN
40.0000 mg | SUBCUTANEOUS | Status: DC
  Administered 2020-04-16 – 2020-04-20 (×6): 40 mg via SUBCUTANEOUS
  Filled 2020-04-14 (×6): qty 0.4

## 2020-04-14 MED ORDER — LABETALOL HCL 5 MG/ML IV SOLN: 5.0000 mg | Freq: Four times a day (QID) | INTRAVENOUS | Status: DC | PRN

## 2020-04-14 MED ORDER — HYDRALAZINE HCL 20 MG/ML IJ SOLN: 10.0000 mg | Freq: Four times a day (QID) | INTRAMUSCULAR | Status: DC | PRN

## 2020-04-14 MED ORDER — AMLODIPINE BESYLATE 5 MG PO TABS
5.0000 mg | ORAL_TABLET | Freq: Every day | ORAL | Status: DC
  Administered 2020-04-14 – 2020-04-15 (×2): 5 mg via ORAL
  Filled 2020-04-14 (×2): qty 1

## 2020-04-14 NOTE — Plan of Care (Signed)
  Problem: Coping: Goal: Level of anxiety will decrease Outcome: Progressing   Problem: Pain Managment: Goal: General experience of comfort will improve Outcome: Progressing   Problem: Safety: Goal: Ability to remain free from injury will improve Outcome: Progressing   Problem: Skin Integrity: Goal: Risk for impaired skin integrity will decrease Outcome: Progressing   

## 2020-04-14 NOTE — Plan of Care (Signed)

## 2020-04-14 NOTE — Progress Notes (Addendum)
PROGRESS NOTE        PATIENT DETAILS Name: Thomas Park Age: 29 y.o. Sex: male Date of Birth: 01/24/1991 Admit Date: 04/11/2020 Admitting Physician Bonnielee Haff, MD PCP:[place], Lavell Islam, MD  Brief Narrative: Patient is a 29 y.o. 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  See below for further details.  Significant events: 7/29>> referred to the ED for admission from infectious disease clinic for evaluation of ataxia/cognitive deficits.  Significant studies: 7/29>> MRI brain: Interval decrease in size of multiple lesions involving the bilateral cerebral hemispheres/brainstem and cerebellum-Constellation of findings suggest continued response to therapy.  HIV encephalopathy  Consults: Neuro, ID  DVT Prophylaxis : enoxaparin (LOVENOX) injection 40 mg Start: 04/16/20 0000  Subjective: No overnight events  Assessment/Plan: Progressive ataxia/cognitive dysfunction: Etiology unknown-but given HIV-concern for infection with JC virus/CMV/HIV encephalitis etc.   -MRI brain --neurology advising LP to be done under fluoroscopy-given large volume CSF-and special studies that are needed-ID advising that this be done on 8/2.   -Given difficulties to get him to be inpatient-ID advising that he remain inpatient until inpatient work-up is complete.  CNS toxoplasmosis: Continue maintenance medications-remains on clindamycin, dapsone, leucovorin, pyrimethamine.  HIV/AIDS: Remains on antiretrovirals  HTN -PRNs with close adjustment/following -does not appear to have meds from home  Morbid Obesity: Estimated body mass index is 33.57 kg/m as calculated from the following:   Height as of this encounter: 5\' 5"  (1.651 m).   Weight as of this encounter: 91.5 kg.    Diet: Diet Order            Diet regular Room service appropriate? No; Fluid  consistency: Thin  Diet effective now                  Code Status: Full code   Disposition Plan: Status is: Inpatient  Remains inpatient appropriate because:Inpatient level of care appropriate due to severity of illness   Dispo: The patient is from: Home              Anticipated d/c is to: Home              Anticipated d/c date is: 2 days              Patient currently is not medically stable to d/c.  Barriers to Discharge: HIV AIDS with encephalopathy/ataxia-needing inpatient work-up including LP/MRI  Antimicrobial agents: Anti-infectives (From admission, onward)   Start     Dose/Rate Route Frequency Ordered Stop   04/12/20 1000  dapsone tablet 100 mg     Discontinue     100 mg Oral Daily 04/11/20 1901     04/12/20 0800  pyrimethamine (DARAPRIM) tablet 50 mg     Discontinue     50 mg Oral Daily with breakfast 04/11/20 1901     04/11/20 2200  clindamycin (CLEOCIN) capsule 600 mg     Discontinue     600 mg Oral 3 times daily 04/11/20 1901     04/11/20 1915  bictegravir-emtricitabine-tenofovir AF (BIKTARVY) 50-200-25 MG per tablet 1 tablet     Discontinue     1 tablet Oral Daily 04/11/20 1901         Time spent: 15- minutes-Greater than 50% of this time was spent in counseling, explanation of diagnosis, planning  of further management, and coordination of care.  MEDICATIONS: Scheduled Meds: . amLODipine  5 mg Oral Daily  . bictegravir-emtricitabine-tenofovir AF  1 tablet Oral Daily  . clindamycin  600 mg Oral TID  . dapsone  100 mg Oral Daily  . [START ON 04/16/2020] enoxaparin (LOVENOX) injection  40 mg Subcutaneous Q24H  . leucovorin  25 mg Oral Daily  . pyrimethamine  50 mg Oral Q breakfast  . sodium chloride flush  3 mL Intravenous Q12H   Continuous Infusions: . sodium chloride     PRN Meds:.sodium chloride, acetaminophen **OR** acetaminophen, labetalol, ondansetron **OR** ondansetron (ZOFRAN) IV, sodium chloride flush   PHYSICAL EXAM: Vital signs: Vitals:    04/13/20 1639 04/13/20 1927 04/14/20 0300 04/14/20 1005  BP:  (!) 139/86 (!) 132/82 (!) 185/160  Pulse: 90 96 91 (!) 107  Resp: 16 19 18 18   Temp: 98.2 F (36.8 C) 98 F (36.7 C) 98.3 F (36.8 C) 98.4 F (36.9 C)  TempSrc: Oral Oral Axillary Oral  SpO2: 98% 94% 95% 92%  Weight:      Height:       Filed Weights   04/12/20 0400  Weight: 91.5 kg   Body mass index is 33.57 kg/m.   In bed, NAD No increased work of breathing rrr    I have personally reviewed following labs and imaging studies  LABORATORY DATA: CBC: Recent Labs  Lab 04/11/20 2004  WBC 9.2  NEUTROABS 4.7  HGB 14.4  HCT 42.8  MCV 91.8  PLT 623    Basic Metabolic Panel: Recent Labs  Lab 04/11/20 2004  NA 134*  K 3.7  CL 103  CO2 23  GLUCOSE 119*  BUN 11  CREATININE 0.74  CALCIUM 8.7*    GFR: Estimated Creatinine Clearance: 140.4 mL/min (by C-G formula based on SCr of 0.74 mg/dL).  Liver Function Tests: Recent Labs  Lab 04/11/20 2004  AST 31  ALT 33  ALKPHOS 99  BILITOT 0.6  PROT 7.1  ALBUMIN 3.7   No results for input(s): LIPASE, AMYLASE in the last 168 hours. No results for input(s): AMMONIA in the last 168 hours.  Coagulation Profile: No results for input(s): INR, PROTIME in the last 168 hours.  Cardiac Enzymes: No results for input(s): CKTOTAL, CKMB, CKMBINDEX, TROPONINI in the last 168 hours.  BNP (last 3 results) No results for input(s): PROBNP in the last 8760 hours.  Lipid Profile: No results for input(s): CHOL, HDL, LDLCALC, TRIG, CHOLHDL, LDLDIRECT in the last 72 hours.  Thyroid Function Tests: No results for input(s): TSH, T4TOTAL, FREET4, T3FREE, THYROIDAB in the last 72 hours.  Anemia Panel: No results for input(s): VITAMINB12, FOLATE, FERRITIN, TIBC, IRON, RETICCTPCT in the last 72 hours.  Urine analysis:    Component Value Date/Time   COLORURINE YELLOW 08/12/2018 1918   APPEARANCEUR CLEAR 08/12/2018 1918   LABSPEC 1.010 08/12/2018 1918   PHURINE  6.0 08/12/2018 1918   GLUCOSEU NEGATIVE 08/12/2018 1918   HGBUR NEGATIVE 08/12/2018 1918   BILIRUBINUR NEGATIVE 08/12/2018 1918   KETONESUR NEGATIVE 08/12/2018 1918   PROTEINUR NEGATIVE 08/12/2018 1918   NITRITE NEGATIVE 08/12/2018 1918   LEUKOCYTESUR NEGATIVE 08/12/2018 1918    Sepsis Labs: Lactic Acid, Venous No results found for: LATICACIDVEN  MICROBIOLOGY: Recent Results (from the past 240 hour(s))  SARS Coronavirus 2 by RT PCR (hospital order, performed in [place] hospital lab) Nasopharyngeal Nasopharyngeal Swab     Status: None   Collection Time: 04/11/20 10:11 PM   Specimen: Nasopharyngeal Swab  Result Value Ref Range Status   SARS Coronavirus 2 NEGATIVE NEGATIVE Final    Comment: (NOTE) SARS-CoV-2 target nucleic acids are NOT DETECTED.  The SARS-CoV-2 RNA is generally detectable in upper and lower respiratory specimens during the acute phase of infection. The lowest concentration of SARS-CoV-2 viral copies this assay can detect is 250 copies / mL. A negative result does not preclude SARS-CoV-2 infection and should not be used as the sole basis for treatment or other patient management decisions.  A negative result may occur with improper specimen collection / handling, submission of specimen other than nasopharyngeal swab, presence of viral mutation(s) within the areas targeted by this assay, and inadequate number of viral copies (<250 copies / mL). A negative result must be combined with clinical observations, patient history, and epidemiological information.  Fact Sheet for Patients:   StrictlyIdeas.no  Fact Sheet for Healthcare Providers: BankingDealers.co.za  This test is not yet approved or  cleared by the [place] FDA and has been authorized for detection and/or diagnosis of SARS-CoV-2 by FDA under an Emergency Use Authorization (EUA).  This EUA will remain in effect (meaning this test can be used) for  the duration of the COVID-19 declaration under Section 564(b)(1) of the Act, 21 U.S.C. section 360bbb-3(b)(1), unless the authorization is terminated or revoked sooner.  Performed at [facility], [place] [address] [place], [place] [address]     RADIOLOGY STUDIES/RESULTS: No results found.   LOS: 3 days   Geradine Girt, DO [facility]    To contact the attending provider between 7A-7P or the covering provider during after hours 7P-7A, please log into the web site [email or web address] and access using universal [place] password for that web site. If you do not have the password, please call the hospital operator.  04/14/2020, 2:35 PM

## 2020-04-15 ENCOUNTER — Inpatient Hospital Stay (HOSPITAL_COMMUNITY): Payer: Self-pay

## 2020-04-15 LAB — CD4/CD8 (T-HELPER/T-SUPPRESSOR CELL)
CD4 absolute: 455 /uL (ref 400–1790)
CD4%: 14 % — ABNORMAL LOW (ref 33–65)
CD8 T Cell Abs: 1901 /uL — ABNORMAL HIGH (ref 190–1000)
CD8tox: 60 % — ABNORMAL HIGH (ref 12–40)
Ratio: 0.24 — ABNORMAL LOW (ref 1.0–3.0)
Total lymphocyte count: 3143 /uL (ref 1000–4000)

## 2020-04-15 LAB — CSF CELL COUNT WITH DIFFERENTIAL
RBC Count, CSF: 24 /mm3 — ABNORMAL HIGH
RBC Count, CSF: 88 /mm3 — ABNORMAL HIGH
Tube #: 1
Tube #: 4
WBC, CSF: 3 /mm3 (ref 0–5)
WBC, CSF: 4 /mm3 (ref 0–5)

## 2020-04-15 LAB — CMV DNA, QUANTITATIVE, PCR
CMV DNA Quant: POSITIVE IU/mL
Log10 CMV Qn DNA Pl: UNDETERMINED log10 IU/mL

## 2020-04-15 LAB — GLUCOSE, CAPILLARY
Glucose-Capillary: 106 mg/dL — ABNORMAL HIGH (ref 70–99)
Glucose-Capillary: 185 mg/dL — ABNORMAL HIGH (ref 70–99)

## 2020-04-15 LAB — BASIC METABOLIC PANEL
Anion gap: 9 (ref 5–15)
BUN: 12 mg/dL (ref 6–20)
CO2: 26 mmol/L (ref 22–32)
Calcium: 9.1 mg/dL (ref 8.9–10.3)
Chloride: 104 mmol/L (ref 98–111)
Creatinine, Ser: 0.93 mg/dL (ref 0.61–1.24)
GFR calc Af Amer: >60 mL/min (ref >60)
GFR calc non Af Amer: >60 mL/min (ref >60)
Glucose, Bld: 90 mg/dL (ref 70–99)
Potassium: 4.1 mmol/L (ref 3.5–5.1)
Sodium: 139 mmol/L (ref 135–145)

## 2020-04-15 LAB — CBC
HCT: 44.7 % (ref 39.0–52.0)
Hemoglobin: 14.4 g/dL (ref 13.0–17.0)
MCH: 29.6 pg (ref 26.0–34.0)
MCHC: 32.2 g/dL (ref 30.0–36.0)
MCV: 91.8 fL (ref 80.0–100.0)
Platelets: 310 10*3/uL (ref 150–400)
RBC: 4.87 MIL/uL (ref 4.22–5.81)
RDW: 13.7 % (ref 11.5–15.5)
WBC: 9.9 10*3/uL (ref 4.0–10.5)
nRBC: 0 % (ref 0.0–0.2)

## 2020-04-15 LAB — GLUCOSE, CSF: Glucose, CSF: 48 mg/dL (ref 40–70)

## 2020-04-15 LAB — PROTEIN, CSF: Total  Protein, CSF: 59 mg/dL — ABNORMAL HIGH (ref 15–45)

## 2020-04-15 MED ORDER — LIDOCAINE HCL (PF) 1 % IJ SOLN
5.0000 mL | Freq: Once | INTRAMUSCULAR | Status: AC
  Administered 2020-04-15: 8 mL via INTRADERMAL

## 2020-04-15 MED ORDER — DORAVIRINE 100 MG PO TABS
100.0000 mg | ORAL_TABLET | Freq: Every day | ORAL | Status: DC
  Administered 2020-04-15 – 2020-04-20 (×6): 100 mg via ORAL
  Filled 2020-04-15 (×10): qty 1

## 2020-04-15 MED ORDER — SODIUM CHLORIDE 0.9 % IV SOLN
500.0000 mg | Freq: Two times a day (BID) | INTRAVENOUS | Status: DC
  Administered 2020-04-15 – 2020-04-20 (×10): 500 mg via INTRAVENOUS
  Filled 2020-04-15 (×13): qty 4

## 2020-04-15 MED ORDER — METOPROLOL TARTRATE 25 MG PO TABS
12.5000 mg | ORAL_TABLET | Freq: Two times a day (BID) | ORAL | Status: DC
  Administered 2020-04-15 – 2020-04-21 (×13): 12.5 mg via ORAL
  Filled 2020-04-15 (×12): qty 1

## 2020-04-15 MED ORDER — LABETALOL HCL 5 MG/ML IV SOLN: 5.0000 mg | Freq: Four times a day (QID) | INTRAVENOUS | Status: DC | PRN

## 2020-04-15 NOTE — Progress Notes (Signed)
Subjective: No new complaints   Antibiotics:  Anti-infectives (From admission, onward)   Start     Dose/Rate Route Frequency Ordered Stop   04/15/20 1500  doravirine (PIFELTRO) tablet 100 mg     Discontinue     100 mg Oral Daily 04/15/20 1428     04/12/20 1000  dapsone tablet 100 mg     Discontinue     100 mg Oral Daily 04/11/20 1901     04/12/20 0800  pyrimethamine (DARAPRIM) tablet 50 mg     Discontinue     50 mg Oral Daily with breakfast 04/11/20 1901     04/11/20 2200  clindamycin (CLEOCIN) capsule 600 mg     Discontinue     600 mg Oral 3 times daily 04/11/20 1901     04/11/20 1915  bictegravir-emtricitabine-tenofovir AF (BIKTARVY) 50-200-25 MG per tablet 1 tablet     Discontinue     1 tablet Oral Daily 04/11/20 1901        Medications: Scheduled Meds: . bictegravir-emtricitabine-tenofovir AF  1 tablet Oral Daily  . clindamycin  600 mg Oral TID  . dapsone  100 mg Oral Daily  . doravirine  100 mg Oral Daily  . [START ON 04/16/2020] enoxaparin (LOVENOX) injection  40 mg Subcutaneous Q24H  . leucovorin  25 mg Oral Daily  . metoprolol tartrate  12.5 mg Oral BID  . pyrimethamine  50 mg Oral Q breakfast  . sodium chloride flush  3 mL Intravenous Q12H   Continuous Infusions: . sodium chloride    . methylPREDNISolone (SOLU-MEDROL) injection     PRN Meds:.sodium chloride, acetaminophen **OR** acetaminophen, labetalol, ondansetron **OR** ondansetron (ZOFRAN) IV, sodium chloride flush    Objective: Weight change:   Intake/Output Summary (Last 24 hours) at 04/15/2020 1500 Last data filed at 04/15/2020 1300 Gross per 24 hour  Intake 480 ml  Output --  Net 480 ml   Blood pressure (!) 116/100, pulse 88, temperature 97.9 F (36.6 C), temperature source Oral, resp. rate 19, height 5\' 5"  (1.651 m), weight 91.5 kg, SpO2 96 %. Temp:  [97.5 F (36.4 C)-98.3 F (36.8 C)] 97.9 F (36.6 C) (08/02 0814) Pulse Rate:  [88-103] 88 (08/02 0814) Resp:  [18-19] 19 (08/02  0814) BP: (116-145)/(70-111) 116/100 (08/02 0814) SpO2:  [96 %-97 %] 96 % (08/02 0814)  Physical Exam: General: Alert and awake HEENT: anicteric sclera, EOMI CVS regular rate, normal  Chest: , no wheezing, no respiratory distress Abdomen: soft non-distended,  Extremities: no edema or deformity noted bilaterally Skin: no rashes Neuro: nonfocal  CBC:    BMET Recent Labs    04/15/20 0737  NA 139  K 4.1  CL 104  CO2 26  GLUCOSE 90  BUN 12  CREATININE 0.93  CALCIUM 9.1     Liver Panel  No results for input(s): PROT, ALBUMIN, AST, ALT, ALKPHOS, BILITOT, BILIDIR, IBILI in the last 72 hours.     Sedimentation Rate No results for input(s): ESRSEDRATE in the last 72 hours. C-Reactive Protein No results for input(s): CRP in the last 72 hours.  Micro Results: Recent Results (from the past 720 hour(s))  SARS Coronavirus 2 by RT PCR (hospital order, performed in [facility] hospital lab) Nasopharyngeal Nasopharyngeal Swab     Status: None   Collection Time: 04/11/20 10:11 PM   Specimen: Nasopharyngeal Swab  Result Value Ref Range Status   SARS Coronavirus 2 NEGATIVE NEGATIVE Final    Comment: (NOTE) SARS-CoV-2 target nucleic  acids are NOT DETECTED.  The SARS-CoV-2 RNA is generally detectable in upper and lower respiratory specimens during the acute phase of infection. The lowest concentration of SARS-CoV-2 viral copies this assay can detect is 250 copies / mL. A negative result does not preclude SARS-CoV-2 infection and should not be used as the sole basis for treatment or other patient management decisions.  A negative result may occur with improper specimen collection / handling, submission of specimen other than nasopharyngeal swab, presence of viral mutation(s) within the areas targeted by this assay, and inadequate number of viral copies (<250 copies / mL). A negative result must be combined with clinical observations, patient history, and epidemiological  information.  Fact Sheet for Patients:   StrictlyIdeas.no  Fact Sheet for Healthcare Providers: BankingDealers.co.za  This test is not yet approved or  cleared by the [place] FDA and has been authorized for detection and/or diagnosis of SARS-CoV-2 by FDA under an Emergency Use Authorization (EUA).  This EUA will remain in effect (meaning this test can be used) for the duration of the COVID-19 declaration under Section 564(b)(1) of the Act, 21 U.S.C. section 360bbb-3(b)(1), unless the authorization is terminated or revoked sooner.  Performed at [facility], [place] [address] [place], [place] [address]   CSF culture     Status: None (Preliminary result)   Collection Time: 04/15/20 12:17 PM   Specimen: PATH Cytology CSF; Cerebrospinal Fluid  Result Value Ref Range Status   Specimen Description CSF  Final   Special Requests NONE  Final   Gram Stain   Final    NO WBC SEEN NO ORGANISMS SEEN CYTOSPIN SMEAR Performed at [facility], [place] [address], [place], [place] [address]    Culture PENDING  Incomplete   Report Status PENDING  Incomplete    Studies/Results: DG FL GUIDED LUMBAR PUNCTURE  Result Date: 04/15/2020 CLINICAL DATA:  Encephalopathy. EXAM: DIAGNOSTIC LUMBAR PUNCTURE UNDER FLUOROSCOPIC GUIDANCE FLUOROSCOPY TIME:  Radiation Exposure Index (if provided by the fluoroscopic device): 29.4 mGy. PROCEDURE: Informed consent was obtained from the patient prior to the procedure, through the use of a translator, including potential complications of headache, allergy, and pain. With the patient prone, the lower back was prepped with Betadine. 1% Lidocaine was used for local anesthesia. Lumbar puncture was performed at the L4-5 level using a 20 gauge needle with return of clear CSF with an opening pressure of 14 cm water. 19 ml of CSF were obtained for laboratory studies. The patient tolerated the procedure well and there were  no apparent complications. IMPRESSION: Under fluoroscopic guidance, lumbar puncture was performed. Electronically Signed   By: Marijo Conception M.D.   On: 04/15/2020 12:20      Assessment/Plan:  INTERVAL HISTORY: pt is sp LP   Active Problems:   AIDS ([place])   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[place])   White matter abnormality on MRI of brain   Cognitive decline    Thomas Park is a 29 y.o. 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  1.  White matter changes on MRI:  Differential includes JC virus related PML though with successful viral suppression systemically seems unlikely  IRIS to JC? Possible though would have shown iris much earlier in his course.  He was diagnosed in 2019.  In December of that year he had a viral load of 669,000 but within a month of antegrade strand transfer inhibitor therapy his viral load was down to 199 and CD4 did come up from 10 to 90.  His virus was imperfectly suppressed and CD4 count did take a while to come up but is now been above 200 since January in fact it now nearing 500  CD8 mediated encephalopathy possible  CMV seems unlikely  CSF has been taken as a 3 white cells seen.  CSF has been sent to pathology for flow cytology  CSF also sent for JC virus and CMV.  CSF will be sent to [facility] for HIV RNA since HIV viral escape is also a leading possible cause here  Starting steroids in case he does have something such as IRIS, CD8 encephalitis that would respond to therapy  Intensifying his ARV with Doravarine in case there is CSF escape virus  2 HIV: Perfectly but fairly well controlled most recent viral load 110 and CD4 count nearly 500.  See above discussion we are intensifying  regimen with DOravirine  3 Toxoplasmosis: He has successfully completed therapy for this but I am going to continue him on treatment while we are giving him high-dose immunosuppressive therapy   LOS: 4 days   Alcide Evener 04/15/2020, 3:00 PM

## 2020-04-15 NOTE — Progress Notes (Signed)
Notified TRH, Dr Marlowe Sax that Patient in the bathroom and heart rate increased to 160 and slowly decreased to 120. Pt voices no complaints upon assessment and heart rate down to 99-100 at 12.36 AM.  RN will continue to monitor patient's status.

## 2020-04-15 NOTE — Progress Notes (Signed)
OT Cancellation Note  Patient Details Name: Thomas Park MRN: [id number] DOB: 12/17/1990   Cancelled Treatment:    Reason Eval/Treat Not Completed: Patient at procedure or test/ unavailable (Pt at lumbar puncture. OT to continue to follow.)   Jefferey Pica, OTR/L Acute Rehabilitation Services Pager: [phone] Office: [phone]  Buford Bremer C 04/15/2020, 12:05 PM

## 2020-04-15 NOTE — Progress Notes (Signed)
PROGRESS NOTE        PATIENT DETAILS Name: Thomas Park Age: 29 y.o. Sex: male Date of Birth: 08-08-1991 Admit Date: 04/11/2020 Admitting Physician Bonnielee Haff, MD PCP:Van Dam, Lavell Islam, MD  Brief Narrative: Patient is a 29 y.o. 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  See below for further details.  Significant events: 7/29>> referred to the ED for admission from infectious disease clinic for evaluation of ataxia/cognitive deficits.  Significant studies: 7/29>> MRI brain: Interval decrease in size of multiple lesions involving the bilateral cerebral hemispheres/brainstem and cerebellum-Constellation of findings suggest continued response to therapy.  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   -MRI brain --neurology advising LP to be done under fluoroscopy-given large volume CSF-and special studies that are needed-ID advising that this be done on 8/2.   -per Dr. 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 After lumbar puncture has been performed initiate Solu-Medrol 500 mg every 12 hours 3.  After LP add Doravirine to his  [employer]ill[facility] [employer]or -Given difficulties to get him to be inpatient-ID advising that he remain inpatient until inpatient work-up is complete.  CNS toxoplasmosis: Continue maintenance medications-remains on clindamycin, dapsone, leucovorin, pyrimethamine.  HIV/AIDS: Remains on antiretrovirals  HTN/sinus tachy -will start low dose BB -monitor closely  Morbid Obesity: Estimated body mass index is 33.57 kg/m as calculated from the following:   Height as of this encounter: 5\' 5"  (1.651 m).   Weight as of this encounter: 91.5 kg.    Diet: Diet Order            Diet regular Room service appropriate? No; Fluid consistency: Thin  Diet effective now                  Code Status: Full code   Disposition Plan: Status is: Inpatient  Remains inpatient appropriate because:Inpatient level of care appropriate due to severity of illness   Dispo: The patient is from: Home              Anticipated d/c is to: Home              Anticipated d/c date is: when work up complete              Patient currently is not medically stable to d/c.  Barriers to Discharge: HIV AIDS with encephalopathy/ataxia-needing inpatient work-up including LP/MRI  Antimicrobial agents: Anti-infectives (From admission, onward)   Start     Dose/Rate Route Frequency Ordered Stop   04/12/20 1000  dapsone tablet 100 mg     Discontinue     100 mg Oral Daily 04/11/20 1901  04/12/20 0800  pyrimethamine (DARAPRIM) tablet 50 mg     Discontinue     50 mg Oral Daily with breakfast 04/11/20 1901     04/11/20 2200  clindamycin (CLEOCIN) capsule 600 mg     Discontinue     600 mg Oral 3 times daily 04/11/20 1901     04/11/20 1915  bictegravir-emtricitabine-tenofovir AF (BIKTARVY) 50-200-25 MG per tablet 1 tablet     Discontinue     1 tablet Oral Daily 04/11/20 1901         Time spent: 25- minutes-Greater than 50% of this time was spent in counseling, explanation of diagnosis, planning of further management, and coordination of  care.  MEDICATIONS: Scheduled Meds: . bictegravir-emtricitabine-tenofovir AF  1 tablet Oral Daily  . clindamycin  600 mg Oral TID  . dapsone  100 mg Oral Daily  . [START ON 04/16/2020] enoxaparin (LOVENOX) injection  40 mg Subcutaneous Q24H  . leucovorin  25 mg Oral Daily  . metoprolol tartrate  12.5 mg Oral BID  . pyrimethamine  50 mg Oral Q breakfast  . sodium chloride flush  3 mL Intravenous Q12H   Continuous Infusions: . sodium chloride     PRN Meds:.sodium chloride, acetaminophen **OR** acetaminophen, labetalol, ondansetron **OR** ondansetron (ZOFRAN) IV, sodium chloride flush   PHYSICAL EXAM: Vital signs: Vitals:   04/14/20 1739 04/14/20 2008 04/15/20 0300 04/15/20 0814  BP: (!) 145/111 (!) 131/81 (!) 130/70 (!) 116/100  Pulse: 103 97 91 88  Resp: 18 19 18 19   Temp: (!) 97.5 F (36.4 C) 98.1 F (36.7 C) 98.3 F (36.8 C) 97.9 F (36.6 C)  TempSrc: Oral Oral Oral Oral  SpO2: 97% 97% 96% 96%  Weight:      Height:       Filed Weights   04/12/20 0400  Weight: 91.5 kg   Body mass index is 33.57 kg/m.   In chair, NAD Answer questions appropriately rrr No increased work of breathing No LE edema  I have personally reviewed following labs and imaging studies  LABORATORY DATA: CBC: Recent Labs  Lab 04/11/20 2004 04/15/20 0737  WBC 9.2 9.9  NEUTROABS 4.7  --   HGB 14.4 14.4  HCT 42.8 44.7  MCV 91.8 91.8  PLT 338 654    Basic Metabolic Panel: Recent Labs  Lab 04/11/20 2004 04/15/20 0737  NA 134* 139  K 3.7 4.1  CL 103 104  CO2 23 26  GLUCOSE 119* 90  BUN 11 12  CREATININE 0.74 0.93  CALCIUM 8.7* 9.1    GFR: Estimated Creatinine Clearance: 120.7 mL/min (by C-G formula based on SCr of 0.93 mg/dL).  Liver Function Tests: Recent Labs  Lab 04/11/20 2004  AST 31  ALT 33  ALKPHOS 99  BILITOT 0.6  PROT 7.1  ALBUMIN 3.7   No results for input(s): LIPASE, AMYLASE in the last 168 hours. No results for input(s): AMMONIA in the last 168  hours.  Coagulation Profile: No results for input(s): INR, PROTIME in the last 168 hours.  Cardiac Enzymes: No results for input(s): CKTOTAL, CKMB, CKMBINDEX, TROPONINI in the last 168 hours.  BNP (last 3 results) No results for input(s): PROBNP in the last 8760 hours.  Lipid Profile: No results for input(s): CHOL, HDL, LDLCALC, TRIG, CHOLHDL, LDLDIRECT in the last 72 hours.  Thyroid Function Tests: No results for input(s): TSH, T4TOTAL, FREET4, T3FREE, THYROIDAB in the last 72 hours.  Anemia Panel: No results for input(s): VITAMINB12, FOLATE, FERRITIN, TIBC, IRON, RETICCTPCT in the  last 72 hours.  Urine analysis:    Component Value Date/Time   COLORURINE YELLOW 08/12/2018 1918   APPEARANCEUR CLEAR 08/12/2018 1918   LABSPEC 1.010 08/12/2018 1918   PHURINE 6.0 08/12/2018 1918   GLUCOSEU NEGATIVE 08/12/2018 1918   HGBUR NEGATIVE 08/12/2018 1918   BILIRUBINUR NEGATIVE 08/12/2018 1918   KETONESUR NEGATIVE 08/12/2018 1918   PROTEINUR NEGATIVE 08/12/2018 1918   NITRITE NEGATIVE 08/12/2018 1918   LEUKOCYTESUR NEGATIVE 08/12/2018 1918    Sepsis Labs: Lactic Acid, Venous No results found for: LATICACIDVEN  MICROBIOLOGY: Recent Results (from the past 240 hour(s))  SARS Coronavirus 2 by RT PCR (hospital order, performed in [place] hospital lab) Nasopharyngeal Nasopharyngeal Swab     Status: None   Collection Time: 04/11/20 10:11 PM   Specimen: Nasopharyngeal Swab  Result Value Ref Range Status   SARS Coronavirus 2 NEGATIVE NEGATIVE Final    Comment: (NOTE) SARS-CoV-2 target nucleic acids are NOT DETECTED.  The SARS-CoV-2 RNA is generally detectable in upper and lower respiratory specimens during the acute phase of infection. The lowest concentration of SARS-CoV-2 viral copies this assay can detect is 250 copies / mL. A negative result does not preclude SARS-CoV-2 infection and should not be used as the sole basis for treatment or other patient management decisions.  A  negative result may occur with improper specimen collection / handling, submission of specimen other than nasopharyngeal swab, presence of viral mutation(s) within the areas targeted by this assay, and inadequate number of viral copies (<250 copies / mL). A negative result must be combined with clinical observations, patient history, and epidemiological information.  Fact Sheet for Patients:   StrictlyIdeas.no  Fact Sheet for Healthcare Providers: BankingDealers.co.za  This test is not yet approved or  cleared by the [place] FDA and has been authorized for detection and/or diagnosis of SARS-CoV-2 by FDA under an Emergency Use Authorization (EUA).  This EUA will remain in effect (meaning this test can be used) for the duration of the COVID-19 declaration under Section 564(b)(1) of the Act, 21 U.S.C. section 360bbb-3(b)(1), unless the authorization is terminated or revoked sooner.  Performed at [facility], [place] [address] [place], [place] [address]     RADIOLOGY STUDIES/RESULTS: No results found.   LOS: 4 days   Geradine Girt, DO [facility]    To contact the attending provider between 7A-7P or the covering provider during after hours 7P-7A, please log into the web site [email or web address] and access using universal [place] password for that web site. If you do not have the password, please call the hospital operator.  04/15/2020, 9:44 AM

## 2020-04-15 NOTE — Progress Notes (Signed)
Physical Therapy Treatment Patient Details Name: Thomas Park MRN: [id number] DOB: 1990-10-14 Today's Date: 04/15/2020    History of Present Illness 29 y.o. 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 Pt admitted on 7/29 with concern for IRIS, ataxia and memory decline.    PT Comments    Patient received in bed, visitor present. He requires ipad for spanish interpretation. [facility] #[id number]. Patient is agreeable to PT session. Reports no pain. Performed bed mobility with independence. Good sitting balance. Sit to stand independently with supervision. He is able to ambulate 200 feet without ad, min assist for balance and safety. He is unsteady and ataxic with ambulation. R side more affected than left. Session cut short as transport arrived to take patient for lumbar puncture. He will continue to benefit from skilled PT while here to improve safety with mobility.        Follow Up Recommendations  Outpatient PT;Supervision for mobility/OOB     Equipment Recommendations  None recommended by PT    Recommendations for Other Services       Precautions / Restrictions Precautions Precautions: Fall Restrictions Weight Bearing Restrictions: No    Mobility  Bed Mobility Overal bed mobility: Independent Bed Mobility: Supine to Sit;Sit to Supine     Supine to sit: Independent Sit to supine: Independent      Transfers Overall transfer level: Modified independent Equipment used: None Transfers: Sit to/from Stand Sit to Stand: Modified independent (Device/Increase time)            Ambulation/Gait Ambulation/Gait assistance: Min Web designer (Feet): 200 Feet Assistive device: None Gait Pattern/deviations: Ataxic;Staggering right;Steppage;Step-through pattern Gait velocity: WFL   General Gait Details: min assist for ambulation without ad. Staggers to right, more ataxic on right with  occasional scissoring. Steppage on right. No overt lob but at increased reisk of falls with mobility if not supervised.   Stairs             Wheelchair Mobility    Modified Rankin (Stroke Patients Only)       Balance Overall balance assessment: Needs assistance;History of Falls Sitting-balance support: No upper extremity supported;Feet supported Sitting balance-Leahy Scale: Good     Standing balance support: No upper extremity supported;During functional activity Standing balance-Leahy Scale: Fair Standing balance comment: ataxic, unsteady, external min assist to maintain balance                            Cognition Arousal/Alertness: Awake/alert Behavior During Therapy: WFL for tasks assessed/performed Overall Cognitive Status: Impaired/Different from baseline Area of Impairment: Memory;Safety/judgement;Awareness;Problem solving                   Current Attention Level: Sustained Memory: Decreased short-term memory Following Commands: Follows one step commands consistently Safety/Judgement: Decreased awareness of safety;Decreased awareness of deficits Awareness: Intellectual Problem Solving: Requires verbal cues General Comments: Patient requires [place] interpreter. Improved ability following direction this visit. Appears more appropriate than prior sessions. Decreased impulsivity      Exercises      General Comments        Pertinent Vitals/Pain Pain Assessment: No/denies pain    Home Living                      Prior Function            PT Goals (current  goals can now be found in the care plan section) Acute Rehab PT Goals Patient Stated Goal: go home PT Goal Formulation: With patient/family Time For Goal Achievement: 04/26/20 Potential to Achieve Goals: Good Progress towards PT goals: Progressing toward goals    Frequency    Min 3X/week      PT Plan Current plan remains appropriate    Co-evaluation               AM-PAC PT "6 Clicks" Mobility   Outcome Measure  Help needed turning from your back to your side while in a flat bed without using bedrails?: None Help needed moving from lying on your back to sitting on the side of a flat bed without using bedrails?: None Help needed moving to and from a bed to a chair (including a wheelchair)?: A Little Help needed standing up from a chair using your arms (e.g., wheelchair or bedside chair)?: A Little Help needed to walk in hospital room?: A Little Help needed climbing 3-5 steps with a railing? : A Little 6 Click Score: 20    End of Session Equipment Utilized During Treatment: Gait belt Activity Tolerance: Patient tolerated treatment well Patient left: in bed;with family/visitor present Nurse Communication: Mobility status PT Visit Diagnosis: Ataxic gait (R26.0);Difficulty in walking, not elsewhere classified (R26.2);Unsteadiness on feet (R26.81);History of falling (Z91.81)     Time: 1020-1040 PT Time Calculation (min) (ACUTE ONLY): 20 min  Charges:  $Gait Training: 8-22 mins                     Jervis Trapani, PT, GCS 04/15/20,10:52 AM

## 2020-04-16 DIAGNOSIS — R9082 White matter disease, unspecified: Secondary | ICD-10-CM

## 2020-04-16 LAB — CYTOLOGY - NON PAP

## 2020-04-16 LAB — GLUCOSE, CAPILLARY
Glucose-Capillary: 159 mg/dL — ABNORMAL HIGH (ref 70–99)
Glucose-Capillary: 159 mg/dL — ABNORMAL HIGH (ref 70–99)
Glucose-Capillary: 164 mg/dL — ABNORMAL HIGH (ref 70–99)
Glucose-Capillary: 175 mg/dL — ABNORMAL HIGH (ref 70–99)

## 2020-04-16 NOTE — Plan of Care (Signed)

## 2020-04-16 NOTE — Progress Notes (Addendum)
PROGRESS NOTE        PATIENT DETAILS Name: Thomas Park Age: 29 y.o. Sex: male Date of Birth: October 07, 1990 Admit Date: 04/11/2020 Admitting Physician Bonnielee Haff, MD PCP:[place], Lavell Islam, MD  Brief Narrative: Patient is a 29 y.o. 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  See below for further details.  Significant events: 7/29>> referred to the ED for admission from infectious disease clinic for evaluation of ataxia/cognitive deficits.  Significant studies: 7/29>> MRI brain: Interval decrease in size of multiple lesions involving the bilateral cerebral hemispheres/brainstem and cerebellum-Constellation of findings suggest continued response to therapy.  HIV encephalopathy  Consults: Neuro, ID  DVT Prophylaxis : enoxaparin (LOVENOX) injection 40 mg Start: 04/16/20 0000  Subjective: Discussed with nursing, appears patient's HR goes up when a medical person enters the room so suspect anxiety is contributing   Assessment/Plan: Progressive ataxia/cognitive dysfunction: Etiology unknown-but given HIV-concern for infection with JC virus/CMV/HIV encephalitis etc.   -MRI brain --neurology advising LP to be done under fluoroscopy-given large volume CSF-and special studies that are needed-ID advising that this be done on 8/2.   -per Dr. 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 After lumbar puncture has been performed initiate Solu-Medrol 500 mg every 12  hours 3.  After LP add Doravirine to his [place] -Given difficulties to get him to be inpatient-ID advising that he remain inpatient until inpatient work-up is complete. -IV Steroids started 8/2 -added on B1 to labs  CNS toxoplasmosis: Continue maintenance medications-remains on clindamycin, dapsone, leucovorin, pyrimethamine.  HIV/AIDS: Remains on antiretrovirals  HTN/sinus tachy -will start low dose BB -suspect related to anxiety so may not need long term  Morbid Obesity: Estimated body mass index is 33.57 kg/m as calculated from the following:   Height as of this encounter: 5\' 5"  (1.651 m).   Weight as of this encounter: 91.5 kg.    Diet: Diet Order            Diet regular Room service appropriate? Yes; Fluid consistency: Thin  Diet effective now                  Code Status: Full code   Disposition Plan: Status is: Inpatient  Remains inpatient appropriate because:Inpatient level of care appropriate due to severity of illness   Dispo: The patient is from: Home              Anticipated d/c is to: Home              Anticipated d/c date is: when work up complete              Patient currently is not medically stable to d/c.  Barriers to Discharge: HIV AIDS with encephalopathy/ataxia-needing inpatient work-up including LP/MRI  Antimicrobial agents: Anti-infectives (From admission, onward)   Start     Dose/Rate Route Frequency Ordered Stop   04/15/20  1500  doravirine (PIFELTRO) tablet 100 mg     Discontinue     100 mg Oral Daily 04/15/20 1428     04/12/20 1000  dapsone tablet 100 mg     Discontinue     100 mg Oral Daily 04/11/20 1901     04/12/20 0800  pyrimethamine (DARAPRIM) tablet 50 mg     Discontinue     50 mg Oral Daily with breakfast 04/11/20 1901     04/11/20 2200  clindamycin (CLEOCIN) capsule 600 mg     Discontinue     600 mg Oral 3 times daily 04/11/20 1901     04/11/20 1915  bictegravir-emtricitabine-tenofovir AF (BIKTARVY) 50-200-25 MG per tablet  1 tablet     Discontinue     1 tablet Oral Daily 04/11/20 1901         Time spent: 15- minutes-Greater than 50% of this time was spent in counseling, explanation of diagnosis, planning of further management, and coordination of care.  MEDICATIONS: Scheduled Meds: . bictegravir-emtricitabine-tenofovir AF  1 tablet Oral Daily  . clindamycin  600 mg Oral TID  . dapsone  100 mg Oral Daily  . doravirine  100 mg Oral Daily  . enoxaparin (LOVENOX) injection  40 mg Subcutaneous Q24H  . leucovorin  25 mg Oral Daily  . metoprolol tartrate  12.5 mg Oral BID  . pyrimethamine  50 mg Oral Q breakfast  . sodium chloride flush  3 mL Intravenous Q12H   Continuous Infusions: . sodium chloride    . methylPREDNISolone (SOLU-MEDROL) injection 500 mg (04/16/20 0300)   PRN Meds:.sodium chloride, acetaminophen **OR** acetaminophen, labetalol, ondansetron **OR** ondansetron (ZOFRAN) IV, sodium chloride flush   PHYSICAL EXAM: Vital signs: Vitals:   04/15/20 1652 04/15/20 2001 04/16/20 0408 04/16/20 0922  BP: (!) 126/107 134/87 122/81 128/89  Pulse: 92 97 96 99  Resp: 18 14 16 20   Temp: 98 F (36.7 C) 98.2 F (36.8 C) 98.5 F (36.9 C) 98.6 F (37 C)  TempSrc: Oral Oral Oral Oral  SpO2: 96% 95% 95% 96%  Weight:      Height:       Filed Weights   04/12/20 0400  Weight: 91.5 kg   Body mass index is 33.57 kg/m.   NAD, no increased work of breathing  I have personally reviewed following labs and imaging studies  LABORATORY DATA: CBC: Recent Labs  Lab 04/11/20 2004 04/15/20 0737  WBC 9.2 9.9  NEUTROABS 4.7  --   HGB 14.4 14.4  HCT 42.8 44.7  MCV 91.8 91.8  PLT 338 732    Basic Metabolic Panel: Recent Labs  Lab 04/11/20 2004 04/15/20 0737  NA 134* 139  K 3.7 4.1  CL 103 104  CO2 23 26  GLUCOSE 119* 90  BUN 11 12  CREATININE 0.74 0.93  CALCIUM 8.7* 9.1    GFR: Estimated Creatinine Clearance: 120.7 mL/min (by C-G formula based on SCr of 0.93 mg/dL).  Liver Function  Tests: Recent Labs  Lab 04/11/20 2004  AST 31  ALT 33  ALKPHOS 99  BILITOT 0.6  PROT 7.1  ALBUMIN 3.7   No results for input(s): LIPASE, AMYLASE in the last 168 hours. No results for input(s): AMMONIA in the last 168 hours.  Coagulation Profile: No results for input(s): INR, PROTIME in the last 168 hours.  Cardiac Enzymes: No results for input(s): CKTOTAL, CKMB, CKMBINDEX, TROPONINI in the last 168 hours.  BNP (last 3 results) No results for input(s): PROBNP in the last  8760 hours.  Lipid Profile: No results for input(s): CHOL, HDL, LDLCALC, TRIG, CHOLHDL, LDLDIRECT in the last 72 hours.  Thyroid Function Tests: No results for input(s): TSH, T4TOTAL, FREET4, T3FREE, THYROIDAB in the last 72 hours.  Anemia Panel: No results for input(s): VITAMINB12, FOLATE, FERRITIN, TIBC, IRON, RETICCTPCT in the last 72 hours.  Urine analysis:    Component Value Date/Time   COLORURINE YELLOW 08/12/2018 1918   APPEARANCEUR CLEAR 08/12/2018 1918   LABSPEC 1.010 08/12/2018 1918   PHURINE 6.0 08/12/2018 1918   GLUCOSEU NEGATIVE 08/12/2018 1918   HGBUR NEGATIVE 08/12/2018 1918   BILIRUBINUR NEGATIVE 08/12/2018 1918   KETONESUR NEGATIVE 08/12/2018 1918   PROTEINUR NEGATIVE 08/12/2018 1918   NITRITE NEGATIVE 08/12/2018 1918   LEUKOCYTESUR NEGATIVE 08/12/2018 1918    Sepsis Labs: Lactic Acid, Venous No results found for: LATICACIDVEN  MICROBIOLOGY: Recent Results (from the past 240 hour(s))  SARS Coronavirus 2 by RT PCR (hospital order, performed in [facility] hospital lab) Nasopharyngeal Nasopharyngeal Swab     Status: None   Collection Time: 04/11/20 10:11 PM   Specimen: Nasopharyngeal Swab  Result Value Ref Range Status   SARS Coronavirus 2 NEGATIVE NEGATIVE Final    Comment: (NOTE) SARS-CoV-2 target nucleic acids are NOT DETECTED.  The SARS-CoV-2 RNA is generally detectable in upper and lower respiratory specimens during the acute phase of infection. The  lowest concentration of SARS-CoV-2 viral copies this assay can detect is 250 copies / mL. A negative result does not preclude SARS-CoV-2 infection and should not be used as the sole basis for treatment or other patient management decisions.  A negative result may occur with improper specimen collection / handling, submission of specimen other than nasopharyngeal swab, presence of viral mutation(s) within the areas targeted by this assay, and inadequate number of viral copies (<250 copies / mL). A negative result must be combined with clinical observations, patient history, and epidemiological information.  Fact Sheet for Patients:   StrictlyIdeas.no  Fact Sheet for Healthcare Providers: BankingDealers.co.za  This test is not yet approved or  cleared by the [place] FDA and has been authorized for detection and/or diagnosis of SARS-CoV-2 by FDA under an Emergency Use Authorization (EUA).  This EUA will remain in effect (meaning this test can be used) for the duration of the COVID-19 declaration under Section 564(b)(1) of the Act, 21 U.S.C. section 360bbb-3(b)(1), unless the authorization is terminated or revoked sooner.  Performed at [facility], [place] [address], [place], [place] [address]   CSF culture     Status: None (Preliminary result)   Collection Time: 04/15/20 12:17 PM   Specimen: PATH Cytology CSF; Cerebrospinal Fluid  Result Value Ref Range Status   Specimen Description CSF  Final   Special Requests NONE  Final   Gram Stain NO WBC SEEN NO ORGANISMS SEEN CYTOSPIN SMEAR   Final   Culture   Final    NO GROWTH < 24 HOURS Performed at [facility], [place] [address], [place], [place] [address]    Report Status PENDING  Incomplete    RADIOLOGY STUDIES/RESULTS: DG FL GUIDED LUMBAR PUNCTURE  Result Date: 04/15/2020 CLINICAL DATA:  Encephalopathy. EXAM: DIAGNOSTIC LUMBAR PUNCTURE UNDER FLUOROSCOPIC GUIDANCE  FLUOROSCOPY TIME:  Radiation Exposure Index (if provided by the fluoroscopic device): 29.4 mGy. PROCEDURE: Informed consent was obtained from the patient prior to the procedure, through the use of a translator, including potential complications of headache, allergy, and pain. With the patient prone, the lower back was  prepped with Betadine. 1% Lidocaine was used for local anesthesia. Lumbar puncture was performed at the L4-5 level using a 20 gauge needle with return of clear CSF with an opening pressure of 14 cm water. 19 ml of CSF were obtained for laboratory studies. The patient tolerated the procedure well and there were no apparent complications. IMPRESSION: Under fluoroscopic guidance, lumbar puncture was performed. Electronically Signed   By: Marijo Conception M.D.   On: 04/15/2020 12:20     LOS: 5 days   Geradine Girt, DO [facility]    To contact the attending provider between 7A-7P or the covering provider during after hours 7P-7A, please log into the web site [email or web address] and access using universal [place] password for that web site. If you do not have the password, please call the hospital operator.  04/16/2020, 10:48 AM

## 2020-04-16 NOTE — Plan of Care (Signed)
  Problem: Pain Managment: Goal: General experience of comfort will improve Outcome: Progressing   Problem: Safety: Goal: Ability to remain free from injury will improve Outcome: Progressing   Problem: Skin Integrity: Goal: Risk for impaired skin integrity will decrease Outcome: Progressing   

## 2020-04-16 NOTE — Progress Notes (Signed)
Subjective: No new complaints, with him and his sister using Patent attorney through telephone   Antibiotics:  Anti-infectives (From admission, onward)   Start     Dose/Rate Route Frequency Ordered Stop   04/15/20 1500  doravirine (PIFELTRO) tablet 100 mg     Discontinue     100 mg Oral Daily 04/15/20 1428     04/12/20 1000  dapsone tablet 100 mg     Discontinue     100 mg Oral Daily 04/11/20 1901     04/12/20 0800  pyrimethamine (DARAPRIM) tablet 50 mg     Discontinue     50 mg Oral Daily with breakfast 04/11/20 1901     04/11/20 2200  clindamycin (CLEOCIN) capsule 600 mg     Discontinue     600 mg Oral 3 times daily 04/11/20 1901     04/11/20 1915  bictegravir-emtricitabine-tenofovir AF (BIKTARVY) 50-200-25 MG per tablet 1 tablet     Discontinue     1 tablet Oral Daily 04/11/20 1901        Medications: Scheduled Meds: . bictegravir-emtricitabine-tenofovir AF  1 tablet Oral Daily  . clindamycin  600 mg Oral TID  . dapsone  100 mg Oral Daily  . doravirine  100 mg Oral Daily  . enoxaparin (LOVENOX) injection  40 mg Subcutaneous Q24H  . leucovorin  25 mg Oral Daily  . metoprolol tartrate  12.5 mg Oral BID  . pyrimethamine  50 mg Oral Q breakfast  . sodium chloride flush  3 mL Intravenous Q12H   Continuous Infusions: . sodium chloride    . methylPREDNISolone (SOLU-MEDROL) injection 500 mg (04/16/20 0300)   PRN Meds:.sodium chloride, acetaminophen **OR** acetaminophen, labetalol, ondansetron **OR** ondansetron (ZOFRAN) IV, sodium chloride flush    Objective: Weight change:   Intake/Output Summary (Last 24 hours) at 04/16/2020 1344 Last data filed at 04/16/2020 0100 Gross per 24 hour  Intake --  Output 500 ml  Net -500 ml   Blood pressure 106/89, pulse 99, temperature 97.9 F (36.6 C), temperature source Oral, resp. rate 18, height 5\' 5"  (1.651 m), weight 91.5 kg, SpO2 94 %. Temp:  [97.9 F (36.6 C)-98.6 F (37 C)] 97.9 F (36.6 C) (08/03 1310) Pulse  Rate:  [92-99] 99 (08/03 1310) Resp:  [14-20] 18 (08/03 1310) BP: (106-134)/(81-107) 106/89 (08/03 1310) SpO2:  [94 %-96 %] 94 % (08/03 1310)  Physical Exam: General: Alert and awake HEENT: anicteric sclera, EOMI CVS regular rate, normal  Chest: , no wheezing, no respiratory distress Abdomen: soft non-distended,  Extremities: no edema or deformity noted bilaterally Skin: no rashes  Neuro: nonfocal  CBC:    BMET Recent Labs    04/15/20 0737  NA 139  K 4.1  CL 104  CO2 26  GLUCOSE 90  BUN 12  CREATININE 0.93  CALCIUM 9.1     Liver Panel  No results for input(s): PROT, ALBUMIN, AST, ALT, ALKPHOS, BILITOT, BILIDIR, IBILI in the last 72 hours.     Sedimentation Rate No results for input(s): ESRSEDRATE in the last 72 hours. C-Reactive Protein No results for input(s): CRP in the last 72 hours.  Micro Results: Recent Results (from the past 720 hour(s))  SARS Coronavirus 2 by RT PCR (hospital order, performed in [facility] hospital lab) Nasopharyngeal Nasopharyngeal Swab     Status: None   Collection Time: 04/11/20 10:11 PM   Specimen: Nasopharyngeal Swab  Result Value Ref Range Status   SARS Coronavirus 2 NEGATIVE NEGATIVE  Final    Comment: (NOTE) SARS-CoV-2 target nucleic acids are NOT DETECTED.  The SARS-CoV-2 RNA is generally detectable in upper and lower respiratory specimens during the acute phase of infection. The lowest concentration of SARS-CoV-2 viral copies this assay can detect is 250 copies / mL. A negative result does not preclude SARS-CoV-2 infection and should not be used as the sole basis for treatment or other patient management decisions.  A negative result may occur with improper specimen collection / handling, submission of specimen other than nasopharyngeal swab, presence of viral mutation(s) within the areas targeted by this assay, and inadequate number of viral copies (<250 copies / mL). A negative result must be combined with  clinical observations, patient history, and epidemiological information.  Fact Sheet for Patients:   StrictlyIdeas.no  Fact Sheet for Healthcare Providers: BankingDealers.co.za  This test is not yet approved or  cleared by the [place] FDA and has been authorized for detection and/or diagnosis of SARS-CoV-2 by FDA under an Emergency Use Authorization (EUA).  This EUA will remain in effect (meaning this test can be used) for the duration of the COVID-19 declaration under Section 564(b)(1) of the Act, 21 U.S.C. section 360bbb-3(b)(1), unless the authorization is terminated or revoked sooner.  Performed at [facility], [place] [address], [place], [place] [address]   CSF culture     Status: None (Preliminary result)   Collection Time: 04/15/20 12:17 PM   Specimen: PATH Cytology CSF; Cerebrospinal Fluid  Result Value Ref Range Status   Specimen Description CSF  Final   Special Requests NONE  Final   Gram Stain NO WBC SEEN NO ORGANISMS SEEN CYTOSPIN SMEAR   Final   Culture   Final    NO GROWTH < 24 HOURS Performed at [facility], [place] [address], [place], [place] [address]    Report Status PENDING  Incomplete    Studies/Results: DG FL GUIDED LUMBAR PUNCTURE  Result Date: 04/15/2020 CLINICAL DATA:  Encephalopathy. EXAM: DIAGNOSTIC LUMBAR PUNCTURE UNDER FLUOROSCOPIC GUIDANCE FLUOROSCOPY TIME:  Radiation Exposure Index (if provided by the fluoroscopic device): 29.4 mGy. PROCEDURE: Informed consent was obtained from the patient prior to the procedure, through the use of a translator, including potential complications of headache, allergy, and pain. With the patient prone, the lower back was prepped with Betadine. 1% Lidocaine was used for local anesthesia. Lumbar puncture was performed at the L4-5 level using a 20 gauge needle with return of clear CSF with an opening pressure of 14 cm water. 19 ml of CSF were obtained for  laboratory studies. The patient tolerated the procedure well and there were no apparent complications. IMPRESSION: Under fluoroscopic guidance, lumbar puncture was performed. Electronically Signed   By: Marijo Conception M.D.   On: 04/15/2020 12:20      Assessment/Plan:  INTERVAL HISTORY: Patient has not yet noticed any improvement after initiation of corticosteroids in addition of [place]   Active Problems:   Encephalopathy acute   AIDS ([place])   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[place])   White matter abnormality on MRI of brain   Cognitive decline    [place] is a 29 y.o. 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  1.  White matter changes on MRI:  Differential includes JC virus related PML though  with successful viral suppression systemically seems unlikely  IRIS to JC? Possible though would have shown iris much earlier in his course.  He was diagnosed in 2019.  In December of that year he had a viral load of 669,000 but within a month of antegrade strand transfer inhibitor therapy his viral load was down to 199 and CD4 did come up from 10 to 90.  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  CSF has been sent to pathology for flow cytology  CSF also sent for JC virus and CMV.  CSF will be sent to [facility] for HIV RNA since HIV viral escape is also a leading possible cause here   steroids started  in case he does have something such as IRIS, CD8  encephalitis that would respond to therapy  Intensifiedhis ARV with Doravarine in case there is CSF escape virus  2 HIV: Perfectly but fairly well controlled most recent viral load 110 and CD4 count nearly 500.  See above discussion we are intensifying regimen with DOravirine  3 Toxoplasmosis: He has successfully completed therapy for this but I am going to continue him on treatment while we are giving him high-dose immunosuppressive therapy   LOS: 5 days   Alcide Evener 04/16/2020, 1:44 PM

## 2020-04-17 DIAGNOSIS — H538 Other visual disturbances: Secondary | ICD-10-CM

## 2020-04-17 LAB — JC VIRUS, PCR CSF: JC Virus PCR, CSF: NEGATIVE

## 2020-04-17 LAB — GLUCOSE, CAPILLARY
Glucose-Capillary: 189 mg/dL — ABNORMAL HIGH (ref 70–99)
Glucose-Capillary: 193 mg/dL — ABNORMAL HIGH (ref 70–99)
Glucose-Capillary: 229 mg/dL — ABNORMAL HIGH (ref 70–99)

## 2020-04-17 NOTE — Progress Notes (Signed)
Subjective: No new complaints, with his father Occupational Therapy using Spanish translator through telephone   Antibiotics:  Anti-infectives (From admission, onward)   Start     Dose/Rate Route Frequency Ordered Stop   04/15/20 1500  doravirine (PIFELTRO) tablet 100 mg     Discontinue     100 mg Oral Daily 04/15/20 1428     04/12/20 1000  dapsone tablet 100 mg     Discontinue     100 mg Oral Daily 04/11/20 1901     04/12/20 0800  pyrimethamine (DARAPRIM) tablet 50 mg     Discontinue     50 mg Oral Daily with breakfast 04/11/20 1901     04/11/20 2200  clindamycin (CLEOCIN) capsule 600 mg     Discontinue     600 mg Oral 3 times daily 04/11/20 1901     04/11/20 1915  bictegravir-emtricitabine-tenofovir AF (BIKTARVY) 50-200-25 MG per tablet 1 tablet     Discontinue     1 tablet Oral Daily 04/11/20 1901        Medications: Scheduled Meds: . bictegravir-emtricitabine-tenofovir AF  1 tablet Oral Daily  . clindamycin  600 mg Oral TID  . dapsone  100 mg Oral Daily  . doravirine  100 mg Oral Daily  . enoxaparin (LOVENOX) injection  40 mg Subcutaneous Q24H  . leucovorin  25 mg Oral Daily  . metoprolol tartrate  12.5 mg Oral BID  . pyrimethamine  50 mg Oral Q breakfast  . sodium chloride flush  3 mL Intravenous Q12H   Continuous Infusions: . sodium chloride    . methylPREDNISolone (SOLU-MEDROL) injection 500 mg (04/17/20 0428)   PRN Meds:.sodium chloride, acetaminophen **OR** acetaminophen, labetalol, ondansetron **OR** ondansetron (ZOFRAN) IV, sodium chloride flush    Objective: Weight change:   Intake/Output Summary (Last 24 hours) at 04/17/2020 1320 Last data filed at 04/17/2020 1029 Gross per 24 hour  Intake 240 ml  Output --  Net 240 ml   Blood pressure 126/79, pulse 77, temperature (!) 97.5 F (36.4 C), temperature source Oral, resp. rate 16, height 5\' 5"  (1.651 m), weight 91.5 kg, SpO2 95 %. Temp:  [97.5 F (36.4 C)-98.6 F (37 C)] 97.5 F (36.4 C) (08/04  0454) Pulse Rate:  [77-95] 77 (08/04 0454) Resp:  [16-18] 16 (08/04 0454) BP: (126-133)/(76-79) 126/79 (08/04 0454) SpO2:  [95 %-96 %] 95 % (08/04 0454)  Physical Exam: General: Alert and awake HEENT: anicteric sclera, EOMI though with blurry vision CVS regular rate, normal  Chest: , no wheezing, no respiratory distress Abdomen: soft non-distended,  Extremities: no edema or deformity noted bilaterally Skin: no rashes  Neuro: Bigger to notice is better with left hand versus right he sees double when both eyes are open  CBC:    BMET Recent Labs    04/15/20 0737  NA 139  K 4.1  CL 104  CO2 26  GLUCOSE 90  BUN 12  CREATININE 0.93  CALCIUM 9.1     Liver Panel  No results for input(s): PROT, ALBUMIN, AST, ALT, ALKPHOS, BILITOT, BILIDIR, IBILI in the last 72 hours.     Sedimentation Rate No results for input(s): ESRSEDRATE in the last 72 hours. C-Reactive Protein No results for input(s): CRP in the last 72 hours.  Micro Results: Recent Results (from the past 720 hour(s))  SARS Coronavirus 2 by RT PCR (hospital order, performed in [facility] hospital lab) Nasopharyngeal Nasopharyngeal Swab     Status: None   Collection  Time: 04/11/20 10:11 PM   Specimen: Nasopharyngeal Swab  Result Value Ref Range Status   SARS Coronavirus 2 NEGATIVE NEGATIVE Final    Comment: (NOTE) SARS-CoV-2 target nucleic acids are NOT DETECTED.  The SARS-CoV-2 RNA is generally detectable in upper and lower respiratory specimens during the acute phase of infection. The lowest concentration of SARS-CoV-2 viral copies this assay can detect is 250 copies / mL. A negative result does not preclude SARS-CoV-2 infection and should not be used as the sole basis for treatment or other patient management decisions.  A negative result may occur with improper specimen collection / handling, submission of specimen other than nasopharyngeal swab, presence of viral mutation(s) within the areas targeted  by this assay, and inadequate number of viral copies (<250 copies / mL). A negative result must be combined with clinical observations, patient history, and epidemiological information.  Fact Sheet for Patients:   StrictlyIdeas.no  Fact Sheet for Healthcare Providers: BankingDealers.co.za  This test is not yet approved or  cleared by the [place] FDA and has been authorized for detection and/or diagnosis of SARS-CoV-2 by FDA under an Emergency Use Authorization (EUA).  This EUA will remain in effect (meaning this test can be used) for the duration of the COVID-19 declaration under Section 564(b)(1) of the Act, 21 U.S.C. section 360bbb-3(b)(1), unless the authorization is terminated or revoked sooner.  Performed at [facility], [place] [address], [place], [place] [address]   CSF culture     Status: None (Preliminary result)   Collection Time: 04/15/20 12:17 PM   Specimen: PATH Cytology CSF; Cerebrospinal Fluid  Result Value Ref Range Status   Specimen Description CSF  Final   Special Requests NONE  Final   Gram Stain NO WBC SEEN NO ORGANISMS SEEN CYTOSPIN SMEAR   Final   Culture   Final    NO GROWTH 2 DAYS Performed at [facility], [place] [address] [place],  [address]    Report Status PENDING  Incomplete    Studies/Results: No results found.    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  1.  White matter changes on MRI:  Differential includes JC virus related PML though with successful viral suppression systemically seems unlikely  JC virus was negative for the second time  IRIS to JC? Possible though would have shown iris much earlier in his course.  He was diagnosed in 2019.  In December of that year he had a viral load of 669,000 but within a month of antegrade strand transfer inhibitor therapy his viral load was down to 199 and CD4 did come up from 10 to 90.  His virus was imperfectly suppressed and CD4 count did take a while to come up but is now been above 200 since January in fact it now nearing 500  CD8 mediated encephalopathy possible  Pathology did not show malignant cells flow cytology is pending  CMV seems  unlikely, I see he has SOME CMV in blood but I doubt this is a CMV CNS infection  CSF has been taken as a 3 white cells seen.  CSF will be sent to [facility] for HIV RNA since HIV viral escape is also a leading possible cause here   steroids started  in case he does have something such as IRIS, CD8 encephalitis that would respond to therapy  Intensifiedhis ARV with Doravarine in case there is CSF escape virus  Dr. Larey Brick had recommended funduscopic eye exam to help [place] elucidate what was going on in his CNS.  Given his persistent blurry vision I think we can clearly benefit from an ophthalmology consult with formal dilated funduscopic exam.  2 HIV: Perfectly but fairly well controlled most recent viral load 110 and CD4 count nearly 500.  See above discussion we are intensifying regimen with DOravirine  3 Toxoplasmosis: He has successfully completed therapy for this but I am going to continue him on treatment while we are giving him high-dose immunosuppressive therapy  #4 blurry vision: In the past he was seen in November 2019 and Dr. Carolyn Stare believed that his problems of gaze  paresis and visual field deficits at that time were due to his CNS lesions.  Vision has been stable given the on usual nature of his white matter disease showing up when it did and are limited diagnostic tools we are be helpful if he was seen by ophthalmology during this admission with formal funduscopic exam to exclude infection or other pathology in the eye   LOS: 6 days   Alcide Evener 04/17/2020, 1:20 PM

## 2020-04-17 NOTE — Progress Notes (Deleted)
VAST consulted to obtain IV access. Upon arrival at pt's bedside, pt proceeded to get up and go to bathroom. VAST RN will return to place IV access when able.

## 2020-04-17 NOTE — Progress Notes (Signed)
PROGRESS NOTE        PATIENT DETAILS Name: Thomas Park Age: 29 y.o. Sex: male Date of Birth: [birth year] Admit Date: 04/11/2020 Admitting Physician Bonnielee Haff, MD PCP:[place], Lavell Islam, MD  Brief Narrative: Patient is a 29 y.o. 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  Appears to be improving, also of note is having left eye blurred vision as well as double vision.  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  - S/P imaging/LP as per Neuro/ID - appreciate insight/recs - LP studies pending to rule out viral etiology,cytology - Continue steroids/ Doravirine/biktarvy per ID  Vision changes/Diplopia/Blurred vision - Optho consulted - agree to follow along - R eye vision WNL per patient - L eye blurry/diplopia when both eyes open  CNS toxoplasmosis:  - Continue maintenance medications-remains on clindamycin, dapsone, leucovorin, pyrimethamine.  HIV/AIDS: Remains on treatment per ID as above  HTN/sinus tachy -will start low dose BB -suspect related to anxiety so may not need long term  Morbid Obesity: Estimated body mass index is 33.57 kg/m as calculated from the following:   Height as of this encounter: 5\' 5"  (1.651 m).   Weight as of this encounter: 91.5 kg.   Subjective: No acute issues or events overnight, denies nausea, vomiting, diarrhea, constipation, headache, fevers, chills.  Patient does report left eye total vision blurriness as well as diplopia.  Right eye appears to be normal on exam.  Otherwise only  request/complaint today is to be able to take a shower.  Significant studies: 7/29>> MRI brain: Interval decrease in size of multiple lesions involving the bilateral cerebral hemispheres/brainstem and cerebellum-Constellation of findings suggest continued response to therapy.  HIV encephalopathy  Consults: Neuro, ID  DVT Prophylaxis : enoxaparin (LOVENOX) injection 40 mg Start: 04/16/20 0000 CODE STATUS: Full  Disposition Plan: Status is: Inpatient  Remains inpatient appropriate because:Inpatient level of care appropriate due to severity of illness   Dispo: The patient is from: Home              Anticipated d/c is to: Home              Anticipated d/c date is: when work up complete              Patient currently is not medically stable to d/c.  Due to ongoing need for IV treatment with antibiotics, further imaging, medication management and evaluation with ophthalmology.  Barriers to Discharge: HIV AIDS with encephalopathy/ataxia-needing inpatient work-up including LP/MRI  Antimicrobial agents: Anti-infectives (From admission, onward)   Start     Dose/Rate Route Frequency Ordered Stop   04/15/20 1500  doravirine (PIFELTRO) tablet 100 mg     Discontinue     100 mg Oral Daily 04/15/20 1428     04/12/20 1000  dapsone tablet 100 mg     Discontinue     100 mg Oral Daily 04/11/20 1901     04/12/20 0800  pyrimethamine (DARAPRIM) tablet 50 mg     Discontinue  50 mg Oral Daily with breakfast 04/11/20 1901     04/11/20 2200  clindamycin (CLEOCIN) capsule 600 mg     Discontinue     600 mg Oral 3 times daily 04/11/20 1901     04/11/20 1915  bictegravir-emtricitabine-tenofovir AF (BIKTARVY) 50-200-25 MG per tablet 1 tablet     Discontinue     1 tablet Oral Daily 04/11/20 1901         Time spent: 15- minutes-Greater than 50% of this time was spent in counseling, explanation of diagnosis, planning of further management, and coordination of care.  MEDICATIONS: Scheduled Meds: .  bictegravir-emtricitabine-tenofovir AF  1 tablet Oral Daily  . clindamycin  600 mg Oral TID  . dapsone  100 mg Oral Daily  . doravirine  100 mg Oral Daily  . enoxaparin (LOVENOX) injection  40 mg Subcutaneous Q24H  . leucovorin  25 mg Oral Daily  . metoprolol tartrate  12.5 mg Oral BID  . pyrimethamine  50 mg Oral Q breakfast  . sodium chloride flush  3 mL Intravenous Q12H   Continuous Infusions: . sodium chloride    . methylPREDNISolone (SOLU-MEDROL) injection 500 mg (04/17/20 0428)   PRN Meds:.sodium chloride, acetaminophen **OR** acetaminophen, labetalol, ondansetron **OR** ondansetron (ZOFRAN) IV, sodium chloride flush   PHYSICAL EXAM: Vital signs: Vitals:   04/16/20 1700 04/16/20 2000 04/16/20 2100 04/17/20 0454  BP: 133/76  133/76 126/79  Pulse: 95  95 77  Resp: 16  18 16   Temp: 98.6 F (37 C)  98.1 F (36.7 C) (!) 97.5 F (36.4 C)  TempSrc: Oral Oral Oral Oral  SpO2: 96%   95%  Weight:      Height:       Filed Weights   04/12/20 0400  Weight: 91.5 kg   Body mass index is 33.57 kg/m.   NAD, no increased work of breathing  I have personally reviewed following labs and imaging studies  LABORATORY DATA: CBC: Recent Labs  Lab 04/11/20 2004 04/15/20 0737  WBC 9.2 9.9  NEUTROABS 4.7  --   HGB 14.4 14.4  HCT 42.8 44.7  MCV 91.8 91.8  PLT 338 458    Basic Metabolic Panel: Recent Labs  Lab 04/11/20 2004 04/15/20 0737  NA 134* 139  K 3.7 4.1  CL 103 104  CO2 23 26  GLUCOSE 119* 90  BUN 11 12  CREATININE 0.74 0.93  CALCIUM 8.7* 9.1    GFR: Estimated Creatinine Clearance: 120.7 mL/min (by C-G formula based on SCr of 0.93 mg/dL).  Liver Function Tests: Recent Labs  Lab 04/11/20 2004  AST 31  ALT 33  ALKPHOS 99  BILITOT 0.6  PROT 7.1  ALBUMIN 3.7   No results for input(s): LIPASE, AMYLASE in the last 168 hours. No results for input(s): AMMONIA in the last 168 hours.  Coagulation Profile: No results for input(s): INR, PROTIME in the  last 168 hours.  Cardiac Enzymes: No results for input(s): CKTOTAL, CKMB, CKMBINDEX, TROPONINI in the last 168 hours.  BNP (last 3 results) No results for input(s): PROBNP in the last 8760 hours.  Lipid Profile: No results for input(s): CHOL, HDL, LDLCALC, TRIG, CHOLHDL, LDLDIRECT in the last 72 hours.  Thyroid Function Tests: No results for input(s): TSH, T4TOTAL, FREET4, T3FREE, THYROIDAB in the last 72 hours.  Anemia Panel: No results for input(s): VITAMINB12, FOLATE, FERRITIN, TIBC, IRON, RETICCTPCT in the last 72 hours.  Urine analysis:    Component Value Date/Time   COLORURINE YELLOW 08/12/2018 1918  APPEARANCEUR CLEAR 08/12/2018 1918   LABSPEC 1.010 08/12/2018 1918   PHURINE 6.0 08/12/2018 1918   GLUCOSEU NEGATIVE 08/12/2018 1918   HGBUR NEGATIVE 08/12/2018 1918   BILIRUBINUR NEGATIVE 08/12/2018 1918   KETONESUR NEGATIVE 08/12/2018 1918   PROTEINUR NEGATIVE 08/12/2018 1918   NITRITE NEGATIVE 08/12/2018 1918   LEUKOCYTESUR NEGATIVE 08/12/2018 1918    Sepsis Labs: Lactic Acid, Venous No results found for: LATICACIDVEN  MICROBIOLOGY: Recent Results (from the past 240 hour(s))  SARS Coronavirus 2 by RT PCR (hospital order, performed in [facility] hospital lab) Nasopharyngeal Nasopharyngeal Swab     Status: None   Collection Time: 04/11/20 10:11 PM   Specimen: Nasopharyngeal Swab  Result Value Ref Range Status   SARS Coronavirus 2 NEGATIVE NEGATIVE Final    Comment: (NOTE) SARS-CoV-2 target nucleic acids are NOT DETECTED.  The SARS-CoV-2 RNA is generally detectable in upper and lower respiratory specimens during the acute phase of infection. The lowest concentration of SARS-CoV-2 viral copies this assay can detect is 250 copies / mL. A negative result does not preclude SARS-CoV-2 infection and should not be used as the sole basis for treatment or other patient management decisions.  A negative result may occur with improper specimen collection / handling,  submission of specimen other than nasopharyngeal swab, presence of viral mutation(s) within the areas targeted by this assay, and inadequate number of viral copies (<250 copies / mL). A negative result must be combined with clinical observations, patient history, and epidemiological information.  Fact Sheet for Patients:   StrictlyIdeas.no  Fact Sheet for Healthcare Providers: BankingDealers.co.za  This test is not yet approved or  cleared by the [place] FDA and has been authorized for detection and/or diagnosis of SARS-CoV-2 by FDA under an Emergency Use Authorization (EUA).  This EUA will remain in effect (meaning this test can be used) for the duration of the COVID-19 declaration under Section 564(b)(1) of the Act, 21 U.S.C. section 360bbb-3(b)(1), unless the authorization is terminated or revoked sooner.  Performed at [facility], [place] [address], [place], [place] [address]   CSF culture     Status: None (Preliminary result)   Collection Time: 04/15/20 12:17 PM   Specimen: PATH Cytology CSF; Cerebrospinal Fluid  Result Value Ref Range Status   Specimen Description CSF  Final   Special Requests NONE  Final   Gram Stain NO WBC SEEN NO ORGANISMS SEEN CYTOSPIN SMEAR   Final   Culture   Final    NO GROWTH < 24 HOURS Performed at [facility], [place] [address], [place],  [address]    Report Status PENDING  Incomplete    RADIOLOGY STUDIES/RESULTS: DG FL GUIDED LUMBAR PUNCTURE  Result Date: 04/15/2020 CLINICAL DATA:  Encephalopathy. EXAM: DIAGNOSTIC LUMBAR PUNCTURE UNDER FLUOROSCOPIC GUIDANCE FLUOROSCOPY TIME:  Radiation Exposure Index (if provided by the fluoroscopic device): 29.4 mGy. PROCEDURE: Informed consent was obtained from the patient prior to the procedure, through the use of a translator, including potential complications of headache, allergy, and pain. With the patient prone, the lower back was  prepped with Betadine. 1% Lidocaine was used for local anesthesia. Lumbar puncture was performed at the L4-5 level using a 20 gauge needle with return of clear CSF with an opening pressure of 14 cm water. 19 ml of CSF were obtained for laboratory studies. The patient tolerated the procedure well and there were no apparent complications. IMPRESSION: Under fluoroscopic guidance, lumbar puncture was performed. Electronically Signed   By: Sabino Dick  Jr M.D.   On: 04/15/2020 12:20     LOS: 6 days   Little Ishikawa, DO [facility]  04/17/2020, 8:58 AM

## 2020-04-17 NOTE — Progress Notes (Addendum)
Occupational Therapy Treatment Patient Details Name: Thomas Park MRN: [id number] DOB: October 24, 1990 Today's Date: 04/17/2020    History of present illness 29 y.o. 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 Pt admitted on 7/29 with concern for IRIS, ataxia and memory decline.   OT comments  Pt progressing towards acute OT goals. Focus of this session was functional mobility navigating obstacles in the room and grooming task standing at sink. Father present throughout session. Utilized Spanish video interpretive services. D/c plan remains appropriate.    Follow Up Recommendations  Home health OT;Supervision/Assistance - 24 hour;Other (comment) (noted that pt has no ins. likely financial barrier. to servi)    Equipment Recommendations  3 in 1 bedside commode;Other (comment) (recommended but noted pt has no ins. may have financial barr)    Recommendations for Other Services      Precautions / Restrictions Precautions Precautions: Fall Restrictions Weight Bearing Restrictions: No       Mobility Bed Mobility               General bed mobility comments: sitting EOB at start and end of session  Transfers Overall transfer level: Modified independent Equipment used: None Transfers: Sit to/from Stand Sit to Stand: Min guard;Min assist         General transfer comment: min guard for safety. pt reaching for external support but declined rw this session.     Balance Overall balance assessment: Needs assistance;History of Falls Sitting-balance support: No upper extremity supported;Feet supported Sitting balance-Leahy Scale: Good     Standing balance support: No upper extremity supported;During functional activity Standing balance-Leahy Scale: Poor Standing balance comment: external support needed for standing balance. Mildy impulisive                           ADL either  performed or assessed with clinical judgement   ADL Overall ADL's : Needs assistance/impaired     Grooming: Wash/dry hands;Min guard;Standing Grooming Details (indicate cue type and reason): leaning in to sink for some support. Cued to wash hands. Pt rinsed quickly and stated he was done. Cued to dry hands. Likely needs cues for through completion (vs sequencing?) of task                              Functional mobility during ADLs: Minimal assistance;+2 for safety/equipment (pt declined rw but utilized external support. )       Vision   Vision Assessment?: Vision impaired- to be further tested in functional context Additional Comments: Patient reports left eye total vision blurriness as well as diplopia. Pt states onset of vision deficts was around 2019 (during conversation with MD during OT session).   Perception     Praxis      Cognition Arousal/Alertness: Awake/alert Behavior During Therapy: WFL for tasks assessed/performed Overall Cognitive Status: Impaired/Different from baseline Area of Impairment: Memory;Safety/judgement;Awareness;Problem solving                   Current Attention Level: Selective   Following Commands: Follows one step commands consistently Safety/Judgement: Decreased awareness of safety;Decreased awareness of deficits Awareness: Emergent Problem Solving: Requires verbal cues          Exercises     Shoulder Instructions       General Comments  Impaired fine motor coordination  noted R>L (tested in isolation from visual deficits which are impacting his depth perception and accuracy with reaching/targeting items). impaired thumb to fingertip test, R>L. Pt denies dropping items. BUE strength WNL    Pertinent Vitals/ Pain       Pain Assessment: No/denies pain  Home Living                                          Prior Functioning/Environment              Frequency  Min 2X/week        Progress  Toward Goals  OT Goals(current goals can now be found in the care plan section)  Progress towards OT goals: Progressing toward goals  Acute Rehab OT Goals Patient Stated Goal: go home OT Goal Formulation: With patient Time For Goal Achievement: 04/26/20 Potential to Achieve Goals: Good ADL Goals Pt Will Perform Grooming: with modified independence;standing Pt Will Transfer to Toilet: with modified independence;ambulating;regular height toilet Pt Will Perform Toileting - Clothing Manipulation and hygiene: with modified independence;sit to/from stand Additional ADL Goal #1: Patient will independently reach for and grasp ADL items within 2 attempts. Additional ADL Goal #2: Patient will verbalize 3 fall prevention straegies with min assist. Additional ADL Goal #3: Pt will be independent with exercises/activities plan focusing on R>L fine motor coordination.  Plan Discharge plan remains appropriate    Co-evaluation                 AM-PAC OT "6 Clicks" Daily Activity     Outcome Measure   Help from another person eating meals?: None Help from another person taking care of personal grooming?: A Little Help from another person toileting, which includes using toliet, bedpan, or urinal?: A Little Help from another person bathing (including washing, rinsing, drying)?: A Little Help from another person to put on and taking off regular upper body clothing?: A Little Help from another person to put on and taking off regular lower body clothing?: A Little 6 Click Score: 19    End of Session Equipment Utilized During Treatment: Gait belt;Other (comment) (external support of furniture and walls)  OT Visit Diagnosis: Unsteadiness on feet (R26.81);Other abnormalities of gait and mobility (R26.89);Repeated falls (R29.6);History of falling (Z91.81);Ataxia, unspecified (R27.0);Other symptoms and signs involving cognitive function   Activity Tolerance Patient tolerated treatment well    Patient Left in bed;with call bell/phone within reach;with bed alarm set;with family/visitor present   Nurse Communication          Time: 3086-5784 OT Time Calculation (min): 20 min  Charges: OT General Charges $OT Visit: 1 Visit OT Treatments $Self Care/Home Management : 8-22 mins  Tyrone Schimke, OT Acute Rehabilitation Services Pager: [phone] Office: [phone]    Hortencia Pilar 04/17/2020, 1:59 PM

## 2020-04-17 NOTE — Progress Notes (Signed)
Occupational Therapy Treatment Patient Details Name: Thomas Park MRN: [id number] DOB: 1990/10/19 Today's Date: 04/17/2020    History of present illness 29 y.o. 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 Pt admitted on 7/29 with concern for IRIS, ataxia and memory decline.   OT comments  Focus of session was initiating a fine motor coordination exercises/activies program. Impaired fine motor coordination noted R>L (also tested in isolation from visual deficits which are impacting his depth perception and accuracy with reaching/targeting items). Provided handout in Spanish but also verbally reviewed utilizing video interpretive services and pt completed return demonstrations as pt's visual deficits do to some degree impact his reading ability. Pt's father also present during session. D/c plan remains appropriate.    Follow Up Recommendations  Home health OT;Supervision/Assistance - 24 hour;Other (comment)    Equipment Recommendations  3 in 1 bedside commode;Other (comment) (recommended but noted pt has no ins. may have financial barrier)    Recommendations for Other Services      Precautions / Restrictions Precautions Precautions: Fall Restrictions Weight Bearing Restrictions: No                 Vision   Vision Assessment?: Vision impaired- to be further tested in functional context Additional Comments: Patient reports left eye total vision blurriness as well as diplopia. Pt states onset of vision deficts was around 2019 (during conversation with MD during OT session).   Perception     Praxis      Cognition Arousal/Alertness: Awake/alert Behavior During Therapy: WFL for tasks assessed/performed Overall Cognitive Status: Impaired/Different from baseline Area of Impairment: Memory;Safety/judgement;Awareness;Problem solving                   Current Attention Level:  Selective   Following Commands: Follows one step commands consistently Safety/Judgement: Decreased awareness of safety;Decreased awareness of deficits Awareness: Emergent Problem Solving: Requires verbal cues          Exercises Exercises: Other exercises Other Exercises Other Exercises: Provided [facility] activity/exercise educational handout in [place] and also reviewed verbally utilizing video interpreter and pt completed return demo of exercises as pt endorses vision deficits impacting reading ability. Father also present during session..   Shoulder Instructions       General Comments      Pertinent Vitals/ Pain       Pain Assessment: No/denies pain  Home Living                                          Prior Functioning/Environment              Frequency  Min 2X/week        Progress Toward Goals  OT Goals(current goals can now be found in the care plan section)  Progress towards OT goals: Progressing toward goals  Acute Rehab OT Goals Patient Stated Goal: go home OT Goal Formulation: With patient Time For Goal Achievement: 04/26/20 Potential to Achieve Goals: Good ADL Goals Pt Will Perform Grooming: with modified independence;standing Pt Will Transfer to Toilet: with modified independence;ambulating;regular height toilet Pt Will Perform Toileting - Clothing Manipulation and hygiene: with modified independence;sit to/from stand Additional ADL Goal #1: Patient will independently reach for and grasp ADL items within 2 attempts. Additional ADL Goal #2: Patient will verbalize 3 fall prevention  straegies with min assist. Additional ADL Goal #3: Pt will be independent with exercises/activities plan focusing on R>L fine motor coordination.  Plan Discharge plan remains appropriate    Co-evaluation                 AM-PAC OT "6 Clicks" Daily Activity     Outcome Measure   Help from another person eating meals?: None Help from another person  taking care of personal grooming?: A Little Help from another person toileting, which includes using toliet, bedpan, or urinal?: A Little Help from another person bathing (including washing, rinsing, drying)?: A Little Help from another person to put on and taking off regular upper body clothing?: A Little Help from another person to put on and taking off regular lower body clothing?: A Little 6 Click Score: 19    End of Session Equipment Utilized During Treatment: Gait belt;Other (comment) (external support of furniture and walls)  OT Visit Diagnosis: Unsteadiness on feet (R26.81);Other abnormalities of gait and mobility (R26.89);Repeated falls (R29.6);History of falling (Z91.81);Ataxia, unspecified (R27.0);Other symptoms and signs involving cognitive function   Activity Tolerance Patient tolerated treatment well   Patient Left in bed;with call bell/phone within reach;with bed alarm set;Other (comment);with family/visitor present (sitting EOB)   Nurse Communication          Time: 9449-6759 OT Time Calculation (min): 20 min  Charges: OT General Charges $OT Visit: 1 Visit OT Treatments $Self Care/Home Management : 8-22 mins $Therapeutic Activity: 8-22 mins  Tyrone Schimke, OT Acute Rehabilitation Services Pager: [phone] Office: [phone]    Hortencia Pilar 04/17/2020, 2:10 PM

## 2020-04-17 NOTE — Plan of Care (Signed)
  Problem: Education: Goal: Knowledge of General Education information will improve Description Including pain rating scale, medication(s)/side effects and non-pharmacologic comfort measures Outcome: Progressing   Problem: Health Behavior/Discharge Planning: Goal: Ability to manage health-related needs will improve Outcome: Progressing   

## 2020-04-17 NOTE — Progress Notes (Signed)
Physical Therapy Treatment Patient Details Name: Thomas Park MRN: [id number] DOB: 10-Aug-1991 Today's Date: 04/17/2020    History of Present Illness Pt is a 29 y.o. 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 C/o blurry vision, plan for ophthalmology consult with formal dilated funduscopic exam. MRI 7/30 with multiple lesions inolving bilateral cerebral hemispheres, brainstem, cerebellum, most pronounced of which are positioned about the basalganglia; extensive white matter disease. 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   PT Comments    Pt progressing with mobility, motivated to participate. Today's session focused on gait and balance training with and without UE support as pt with continued ataxia and impaired balance strategies/postural reactions. Will continue to follow acutely.   Follow Up Recommendations  Outpatient PT;Supervision for mobility/OOB     Equipment Recommendations  None recommended by PT ([facility])    Recommendations for Other Services       Precautions / Restrictions Precautions Precautions: Fall Restrictions Weight Bearing Restrictions: No    Mobility  Bed Mobility               General bed mobility comments: Received sitting EOB  Transfers Overall transfer level: Needs assistance Equipment used: Rolling walker (2 wheeled);None Transfers: Sit to/from Stand Sit to Stand: Min guard         General transfer comment: Pulls up on RW. Trialled multiple sit<>stands, pt able to stand without UE support and demonstrate improved stability; min guard for safety  Ambulation/Gait Ambulation/Gait assistance: Min assist Gait Distance (Feet): 310 Feet Assistive device: Rolling walker (2 wheeled) Gait Pattern/deviations: Step-through pattern;Decreased stride length;Ataxic;Staggering left;Staggering right     General Gait Details: Pt choosing RW  over no DME for gait training. Cues for safety, especially to maintain closer proximity to RW with improved stability. Pt requires frequent pauses to "reset" balance due to chronic dizziness. Intermittent minA for stability due to lateral LOB. Endorses stability improved "when I focus on not falling" and when looking at still object in front of him   Stairs             Wheelchair Mobility    Modified Rankin (Stroke Patients Only)       Balance Overall balance assessment: Needs assistance;History of Falls Sitting-balance support: No upper extremity supported;Feet supported Sitting balance-Leahy Scale: Good       Standing balance-Leahy Scale: Fair Standing balance comment: Can static stand without UE support and accept minimal perturbations without LOB. Good reactionary bracing in preparation to expected challenge to balance, but difficulty counteracting the initial bracing when external force/perturbation removed quickly               High Level Balance Comments: Static standing balance with external perturbations to test ankle/hip/stepping strategies            Cognition Arousal/Alertness: Awake/alert Behavior During Therapy: WFL for tasks assessed/performed Overall Cognitive Status: Difficult to assess Area of Impairment: Attention;Safety/judgement;Awareness;Problem solving                   Current Attention Level: Selective   Following Commands: Follows one step commands consistently Safety/Judgement: Decreased awareness of safety;Decreased awareness of deficits Awareness: Emergent Problem Solving: Requires verbal cues General Comments: Apparent cognitive deficits could be exacerbated via Spanish interpreter (ipad). Answering questions and interacting appropriately. Not following gestural commands to be expected. Potentially impulsive with some movement requiring repeated cues      Exercises Other Exercises Other  Exercises: Provided [facility]  activity/exercise educational handout in [place] and also reviewed verbally utilizing video interpreter and pt completed return demo of exercises as pt endorses vision deficits impacting reading ability. Father also present during session..    General Comments General comments (skin integrity, edema, etc.): [employer] ipad interpreter utilizes      Pertinent Vitals/Pain Pain Assessment: No/denies pain    Home Living                      Prior Function            PT Goals (current goals can now be found in the care plan section) Acute Rehab PT Goals Patient Stated Goal: go home Progress towards PT goals: Progressing toward goals    Frequency    Min 3X/week      PT Plan Current plan remains appropriate    Co-evaluation              AM-PAC PT "6 Clicks" Mobility   Outcome Measure  Help needed turning from your back to your side while in a flat bed without using bedrails?: None Help needed moving from lying on your back to sitting on the side of a flat bed without using bedrails?: None Help needed moving to and from a bed to a chair (including a wheelchair)?: A Little Help needed standing up from a chair using your arms (e.g., wheelchair or bedside chair)?: A Little Help needed to walk in hospital room?: A Little Help needed climbing 3-5 steps with a railing? : A Little 6 Click Score: 20    End of Session Equipment Utilized During Treatment: Gait belt Activity Tolerance: Patient tolerated treatment well Patient left: in bed Nurse Communication: Mobility status PT Visit Diagnosis: Ataxic gait (R26.0);Difficulty in walking, not elsewhere classified (R26.2);Unsteadiness on feet (R26.81);History of falling (Z91.81)     Time: 6295-2841 PT Time Calculation (min) (ACUTE ONLY): 17 min  Charges:  $Neuromuscular Re-education: 8-22 mins                     Mabeline Caras, PT, DPT Acute Rehabilitation Services  Pager [phone] Office [place] 04/17/2020, 5:23 PM

## 2020-04-18 DIAGNOSIS — H538 Other visual disturbances: Secondary | ICD-10-CM

## 2020-04-18 LAB — COMPREHENSIVE METABOLIC PANEL
ALT: 58 U/L — ABNORMAL HIGH (ref 0–44)
AST: 36 U/L (ref 15–41)
Albumin: 3.3 g/dL — ABNORMAL LOW (ref 3.5–5.0)
Alkaline Phosphatase: 86 U/L (ref 38–126)
Anion gap: 11 (ref 5–15)
BUN: 20 mg/dL (ref 6–20)
CO2: 23 mmol/L (ref 22–32)
Calcium: 8.6 mg/dL — ABNORMAL LOW (ref 8.9–10.3)
Chloride: 107 mmol/L (ref 98–111)
Creatinine, Ser: 0.95 mg/dL (ref 0.61–1.24)
GFR calc Af Amer: >60 mL/min (ref >60)
GFR calc non Af Amer: >60 mL/min (ref >60)
Glucose, Bld: 175 mg/dL — ABNORMAL HIGH (ref 70–99)
Potassium: 3.8 mmol/L (ref 3.5–5.1)
Sodium: 141 mmol/L (ref 135–145)
Total Bilirubin: 0.9 mg/dL (ref 0.3–1.2)
Total Protein: 6.2 g/dL — ABNORMAL LOW (ref 6.5–8.1)

## 2020-04-18 LAB — CSF CULTURE W GRAM STAIN
Culture: NO GROWTH
Gram Stain: NONE SEEN

## 2020-04-18 LAB — CBC
HCT: 41.5 % (ref 39.0–52.0)
Hemoglobin: 13.6 g/dL (ref 13.0–17.0)
MCH: 30.6 pg (ref 26.0–34.0)
MCHC: 32.8 g/dL (ref 30.0–36.0)
MCV: 93.3 fL (ref 80.0–100.0)
Platelets: 338 10*3/uL (ref 150–400)
RBC: 4.45 MIL/uL (ref 4.22–5.81)
RDW: 14.2 % (ref 11.5–15.5)
WBC: 21.4 10*3/uL — ABNORMAL HIGH (ref 4.0–10.5)
nRBC: 0 % (ref 0.0–0.2)

## 2020-04-18 MED ORDER — DORAVIRINE 100 MG PO TABS: 100.0000 mg | ORAL_TABLET | Freq: Every day | ORAL | 3 refills | Status: DC

## 2020-04-18 NOTE — Progress Notes (Signed)
PROGRESS NOTE        PATIENT DETAILS Name: Thomas Park Age: 29 y.o. Sex: male Date of Birth: 1990-12-15 Admit Date: 04/11/2020 Admitting Physician Bonnielee Haff, MD PCP:[place], Lavell Islam, MD  Brief Narrative: Patient is a 29 y.o. 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  Appears to be improving, also of note is having left eye blurred vision as well as double vision.  Assessment/Plan: Active Problems:   Encephalopathy acute   AIDS ([place])   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[place])   White matter abnormality on MRI of brain   Cognitive decline   Blurry vision   Progressive ataxia/cognitive dysfunction, likely multifactorial, POA - Toxoplasmosis infection, Uncontrolled HIV, and possible ongoing viral infection vs HIV encephalitis continue to be top contenders.  - S/P imaging/LP as per Neuro/ID - appreciate insight/recs - LP studies pending to rule out viral etiology,cytology - Continue steroids/ Doravirine/biktarvy per ID - 04/18/2020, leukocytosis markedly elevated today at 21, previously 9, unclear if lab error or medication reaction, follow morning labs  Vision changes/Diplopia/Blurred vision - Optho consulted - agree to follow along - R eye vision WNL per patient - L eye blurry/diplopia when both eyes open -lateral strabismus noted today on exam  CNS toxoplasmosis:  - Continue maintenance medications-remains on clindamycin, dapsone, leucovorin, pyrimethamine.  HIV/AIDS: Remains on treatment per ID as above  HTN/sinus tachy -will start low dose BB -suspect related to anxiety so may not need long term  Morbid Obesity: Estimated body mass index is 33.57 kg/m as calculated from the following:   Height as of this encounter: 5\' 5"  (1.651 m).   Weight as of this encounter: 91.5 kg.   Subjective: No acute issues or events overnight, denies nausea,  vomiting, diarrhea, constipation, headache, fevers, chills.  Patient does report left eye total vision blurriness as well as diplopia.  Right eye appears to be normal on exam.  Otherwise only request/complaint today is to be able to take a shower.  Significant studies: 7/29>> MRI brain: Interval decrease in size of multiple lesions involving the bilateral cerebral hemispheres/brainstem and cerebellum-Constellation of findings suggest continued response to therapy.  HIV encephalopathy  Consults: Neuro, ID  DVT Prophylaxis : enoxaparin (LOVENOX) injection 40 mg Start: 04/16/20 0000 CODE STATUS: Full  Disposition Plan: Status is: Inpatient  Remains inpatient appropriate because:Inpatient level of care appropriate due to severity of illness   Dispo: The patient is from: Home              Anticipated d/c is to: Home              Anticipated d/c date is: when work up complete              Patient currently is not medically stable to d/c.  Due to ongoing need for IV treatment with antibiotics, further imaging, medication management and evaluation with ophthalmology.  Barriers to Discharge: HIV AIDS with encephalopathy/ataxia-needing inpatient work-up including LP/MRI  Antimicrobial agents: Anti-infectives (From admission, onward)   Start     Dose/Rate Route Frequency Ordered Stop   04/15/20 1500  doravirine (PIFELTRO) tablet 100 mg     Discontinue     100 mg Oral Daily 04/15/20 1428     04/12/20 1000  dapsone tablet 100 mg     Discontinue  100 mg Oral Daily 04/11/20 1901     04/12/20 0800  pyrimethamine (DARAPRIM) tablet 50 mg     Discontinue     50 mg Oral Daily with breakfast 04/11/20 1901     04/11/20 2200  clindamycin (CLEOCIN) capsule 600 mg     Discontinue     600 mg Oral 3 times daily 04/11/20 1901     04/11/20 1915  bictegravir-emtricitabine-tenofovir AF (BIKTARVY) 50-200-25 MG per tablet 1 tablet     Discontinue     1 tablet Oral Daily 04/11/20 1901         MEDICATIONS: Scheduled Meds: . bictegravir-emtricitabine-tenofovir AF  1 tablet Oral Daily  . clindamycin  600 mg Oral TID  . dapsone  100 mg Oral Daily  . doravirine  100 mg Oral Daily  . enoxaparin (LOVENOX) injection  40 mg Subcutaneous Q24H  . leucovorin  25 mg Oral Daily  . metoprolol tartrate  12.5 mg Oral BID  . pyrimethamine  50 mg Oral Q breakfast  . sodium chloride flush  3 mL Intravenous Q12H   Continuous Infusions: . sodium chloride 10 mL/hr at 04/17/20 1718  . methylPREDNISolone (SOLU-MEDROL) injection 500 mg (04/18/20 0639)   PRN Meds:.sodium chloride, acetaminophen **OR** acetaminophen, labetalol, ondansetron **OR** ondansetron (ZOFRAN) IV, sodium chloride flush   PHYSICAL EXAM: Vital signs: Vitals:   04/17/20 1507 04/17/20 1935 04/18/20 0351 04/18/20 0720  BP: 137/84 122/80 107/61 132/71  Pulse: 95 87 70 89  Resp: 18 20 20 19   Temp: 97.8 F (36.6 C) 98.2 F (36.8 C) 98.9 F (37.2 C) 98.6 F (37 C)  TempSrc: Oral Oral Oral   SpO2: 99% 97% 94% 95%  Weight:      Height:       Filed Weights   04/12/20 0400  Weight: 91.5 kg   Body mass index is 33.57 kg/m.   General:  Pleasantly resting in bed, No acute distress. HEENT: Left eye visual field blurry on exam, left lateral strabismus noted minimally with diplopia noted on gross physical exam Neck:  Without mass or deformity.  Trachea is midline. Lungs:  Clear to auscultate bilaterally without rhonchi, wheeze, or rales. Heart:  Regular rate and rhythm.  Without murmurs, rubs, or gallops. Abdomen:  Soft, nontender, nondistended.  Without guarding or rebound. Extremities: Without cyanosis, clubbing, edema, or obvious deformity. Vascular:  Dorsalis pedis and posterior tibial pulses palpable bilaterally. Skin:  Warm and dry, no erythema, no ulcerations.   I have personally reviewed following labs and imaging studies  LABORATORY DATA: CBC: Recent Labs  Lab 04/11/20 2004 04/15/20 0737 04/18/20 0505   WBC 9.2 9.9 21.4*  NEUTROABS 4.7  --   --   HGB 14.4 14.4 13.6  HCT 42.8 44.7 41.5  MCV 91.8 91.8 93.3  PLT 338 310 542    Basic Metabolic Panel: Recent Labs  Lab 04/11/20 2004 04/15/20 0737 04/18/20 0505  NA 134* 139 141  K 3.7 4.1 3.8  CL 103 104 107  CO2 23 26 23   GLUCOSE 119* 90 175*  BUN 11 12 20   CREATININE 0.74 0.93 0.95  CALCIUM 8.7* 9.1 8.6*    GFR: Estimated Creatinine Clearance: 118.2 mL/min (by C-G formula based on SCr of 0.95 mg/dL).  Liver Function Tests: Recent Labs  Lab 04/11/20 2004 04/18/20 0505  AST 31 36  ALT 33 58*  ALKPHOS 99 86  BILITOT 0.6 0.9  PROT 7.1 6.2*  ALBUMIN 3.7 3.3*   No results for input(s): LIPASE, AMYLASE in the  last 168 hours. No results for input(s): AMMONIA in the last 168 hours.  Coagulation Profile: No results for input(s): INR, PROTIME in the last 168 hours.  Cardiac Enzymes: No results for input(s): CKTOTAL, CKMB, CKMBINDEX, TROPONINI in the last 168 hours.  BNP (last 3 results) No results for input(s): PROBNP in the last 8760 hours.  Lipid Profile: No results for input(s): CHOL, HDL, LDLCALC, TRIG, CHOLHDL, LDLDIRECT in the last 72 hours.  Thyroid Function Tests: No results for input(s): TSH, T4TOTAL, FREET4, T3FREE, THYROIDAB in the last 72 hours.  Anemia Panel: No results for input(s): VITAMINB12, FOLATE, FERRITIN, TIBC, IRON, RETICCTPCT in the last 72 hours.  Urine analysis:    Component Value Date/Time   COLORURINE YELLOW 08/12/2018 1918   APPEARANCEUR CLEAR 08/12/2018 1918   LABSPEC 1.010 08/12/2018 1918   PHURINE 6.0 08/12/2018 1918   GLUCOSEU NEGATIVE 08/12/2018 1918   HGBUR NEGATIVE 08/12/2018 1918   BILIRUBINUR NEGATIVE 08/12/2018 1918   KETONESUR NEGATIVE 08/12/2018 1918   PROTEINUR NEGATIVE 08/12/2018 1918   NITRITE NEGATIVE 08/12/2018 1918   LEUKOCYTESUR NEGATIVE 08/12/2018 1918    Sepsis Labs: Lactic Acid, Venous No results found for: LATICACIDVEN  MICROBIOLOGY: Recent Results  (from the past 240 hour(s))  SARS Coronavirus 2 by RT PCR (hospital order, performed in [place] hospital lab) Nasopharyngeal Nasopharyngeal Swab     Status: None   Collection Time: 04/11/20 10:11 PM   Specimen: Nasopharyngeal Swab  Result Value Ref Range Status   SARS Coronavirus 2 NEGATIVE NEGATIVE Final    Comment: (NOTE) SARS-CoV-2 target nucleic acids are NOT DETECTED.  The SARS-CoV-2 RNA is generally detectable in upper and lower respiratory specimens during the acute phase of infection. The lowest concentration of SARS-CoV-2 viral copies this assay can detect is 250 copies / mL. A negative result does not preclude SARS-CoV-2 infection and should not be used as the sole basis for treatment or other patient management decisions.  A negative result may occur with improper specimen collection / handling, submission of specimen other than nasopharyngeal swab, presence of viral mutation(s) within the areas targeted by this assay, and inadequate number of viral copies (<250 copies / mL). A negative result must be combined with clinical observations, patient history, and epidemiological information.  Fact Sheet for Patients:   StrictlyIdeas.no  Fact Sheet for Healthcare Providers: BankingDealers.co.za  This test is not yet approved or  cleared by the [place] FDA and has been authorized for detection and/or diagnosis of SARS-CoV-2 by FDA under an Emergency Use Authorization (EUA).  This EUA will remain in effect (meaning this test can be used) for the duration of the COVID-19 declaration under Section 564(b)(1) of the Act, 21 U.S.C. section 360bbb-3(b)(1), unless the authorization is terminated or revoked sooner.  Performed at [facility], [place] [address], [place], [place] [address]   CSF culture     Status: None (Preliminary result)   Collection Time: 04/15/20 12:17 PM   Specimen: PATH Cytology CSF; Cerebrospinal  Fluid  Result Value Ref Range Status   Specimen Description CSF  Final   Special Requests NONE  Final   Gram Stain NO WBC SEEN NO ORGANISMS SEEN CYTOSPIN SMEAR   Final   Culture   Final    NO GROWTH 2 DAYS Performed at [facility], [place] [address], [place], [place] [address]    Report Status PENDING  Incomplete    RADIOLOGY STUDIES/RESULTS: No results found.   LOS: 7 days   Little Ishikawa,  DO [facility]  04/18/2020, 8:46 AM

## 2020-04-18 NOTE — Plan of Care (Signed)
  Problem: Health Behavior/Discharge Planning: Goal: Ability to manage health-related needs will improve Outcome: Progressing   Problem: Clinical Measurements: Goal: Diagnostic test results will improve Outcome: Not Progressing   Problem: Safety: Goal: Ability to remain free from injury will improve Outcome: Progressing

## 2020-04-18 NOTE — Progress Notes (Signed)
      INFECTIOUS DISEASE ATTENDING ADDENDUM:   Date: 04/18/2020  Patient name: Thomas Park  Medical record number: [id number]  Date of birth: 1991-01-09   Case reviewed with Charolotte Capuchin at [facility].  He recommends getting repeat MRI while on steroids and DORAVIRINE to see if any improvement radiolographically  I have ordered for tomorrow.    Alcide Evener 04/18/2020, 4:55 PM

## 2020-04-18 NOTE — Progress Notes (Signed)
Occupational Therapy Treatment Patient Details Name: Thomas Park MRN: [id number] DOB: 05/22/1991 Today's Date: 04/18/2020    History of present illness Pt is a 29 y.o. 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 C/o blurry vision, plan for ophthalmology consult with formal dilated funduscopic exam. MRI 7/30 with multiple lesions inolving bilateral cerebral hemispheres, brainstem, cerebellum, most pronounced of which are positioned about the basalganglia; extensive white matter disease. 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   OT comments  Patient continues to report diplopia in left eye. Patient provided with partially occluded glasses over left lens to assist with visual fusion. With glasses donned patient reports singular vision. Patient performed picking up small objects and touching target with his finger with and without glasses to determine effectiveness of glasses. Patient reports improvement of coordination with glasses donned. Therapist encouraged patient to wear glasses as tolerated/ Communication via [place] interpreter.    Follow Up Recommendations  Home health OT;Supervision/Assistance - 24 hour;Other (comment)    Equipment Recommendations  3 in 1 bedside commode;Other (comment)    Recommendations for Other Services      Precautions / Restrictions Precautions Precautions: Fall       Mobility Bed Mobility                  Transfers                      Balance                                           ADL either performed or assessed with clinical judgement   ADL                                               Vision Patient Visual Report: Diplopia Additional Comments: Today patient reports normal vision in right eye with left eye occluded. With right occluded patient reports blurry vision. With both eyes  open patient reports diplopia. Therapist provided patient with non prescription glasses and partially occluded left lens with transpore tape to assist with visual fusion. Patient reports singular vision when wearing glasses. Patient able to look at television and cell phone with glasses on and continues to report improved vision.   Perception     Praxis      Cognition                                                Exercises Other Exercises Other Exercises: Patient grasped small rolled up balls of paper towel on bedside table - seated at edge of bed - with and without occluded glasses on. Patient reports improvement in vision and ability to perform task with glasses donned. Other Exercises: Patient performed finger to nose with right hand without glasses - so therapist could observe dysmetria. Without glasses exhibited dysmetria with target x 4. With glasses patient exhibited dysmetria x 1. Patient reports improved vision and hitting target with occluded glasses on.   Shoulder Instructions       General Comments  Pertinent Vitals/ Pain          Home Living                                          Prior Functioning/Environment              Frequency  Min 2X/week        Progress Toward Goals  OT Goals(current goals can now be found in the care plan section)  Progress towards OT goals: Progressing toward goals  Acute Rehab OT Goals Patient Stated Goal: go home OT Goal Formulation: With patient Time For Goal Achievement: 04/26/20 Potential to Achieve Goals: Good  Plan Discharge plan remains appropriate    Co-evaluation                 AM-PAC OT "6 Clicks" Daily Activity     Outcome Measure   Help from another person eating meals?: None Help from another person taking care of personal grooming?: A Little Help from another person toileting, which includes using toliet, bedpan, or urinal?: A Little Help from another  person bathing (including washing, rinsing, drying)?: A Little Help from another person to put on and taking off regular upper body clothing?: A Little Help from another person to put on and taking off regular lower body clothing?: A Little 6 Click Score: 19    End of Session    OT Visit Diagnosis: Unsteadiness on feet (R26.81);Other abnormalities of gait and mobility (R26.89);Repeated falls (R29.6);History of falling (Z91.81);Ataxia, unspecified (R27.0);Other symptoms and signs involving cognitive function   Activity Tolerance Patient tolerated treatment well   Patient Left in bed;with call bell/phone within reach   Nurse Communication  (okay to see per RN)        Time: 5361-4431 OT Time Calculation (min): 12 min  Charges: OT General Charges $OT Visit: 1 Visit OT Treatments $Therapeutic Activity: 8-22 mins  Derl Barrow, OTR/[facility] [place]  Office [phone] Pager: [place] 04/18/2020, 4:18 PM

## 2020-04-19 ENCOUNTER — Inpatient Hospital Stay (HOSPITAL_COMMUNITY): Payer: Self-pay

## 2020-04-19 LAB — VITAMIN B1: Vitamin B1 (Thiamine): 300.4 nmol/L — ABNORMAL HIGH (ref 66.5–200.0)

## 2020-04-19 MED ORDER — GADOBUTROL 1 MMOL/ML IV SOLN
9.0000 mL | Freq: Once | INTRAVENOUS | Status: AC | PRN
  Administered 2020-04-19: 9 mL via INTRAVENOUS

## 2020-04-19 NOTE — Progress Notes (Signed)
PROGRESS NOTE        PATIENT DETAILS Name: Thomas Park Age: 29 y.o. Sex: male Date of Birth: 1991-03-01 Admit Date: 04/11/2020 Admitting Physician Bonnielee Haff, MD PCP:[place], Lavell Islam, MD  Brief Narrative: Patient is a 29 y.o. 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  Appears to be improving, also of note is having left eye blurred vision as well as double vision.  Assessment/Plan: Active Problems:   Encephalopathy acute   AIDS ([place])   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[place])   White matter abnormality on MRI of brain   Cognitive decline   Blurry vision   Progressive ataxia/cognitive dysfunction, likely multifactorial, POA - Toxoplasmosis infection, Uncontrolled HIV, and possible ongoing viral infection vs HIV encephalitis continue to be top contenders.  - S/P imaging/LP as per Neuro/ID - appreciate insight/recs - LP studies pending to rule out viral etiology,cytology - Continue steroids/ Doravirine/biktarvy per ID - Leukocytosis markedly elevated at 21, previously 9, unclear if lab error or medication reaction, follow repeat labs -Repeat MRI 04/19/2020 shows essentially stable exam with motion artifact, no drastic improvement or worsening of disease  Vision changes/Diplopia/Blurred vision - Optho consulted -[name] agree to follow along - R eye vision WNL per patient - L eye blurry/diplopia when both eyes open -lateral strabismus noted today on exam  CNS toxoplasmosis:  - Continue maintenance medications-remains on clindamycin, dapsone, leucovorin, pyrimethamine.  HIV/AIDS: Remains on treatment per ID as above  HTN/sinus tachy -will start low dose BB -suspect related to anxiety so may not need long term  Morbid Obesity: Estimated body mass index is 33.57 kg/m as calculated from the following:   Height as of this encounter: 5\' 5"  (1.651  m).   Weight as of this encounter: 91.5 kg.   Subjective: No acute issues or events overnight, denies nausea, vomiting, diarrhea, constipation, headache, fevers, chills.  Patient does report left eye total vision blurriness as well as diplopia.  Right eye appears to be normal on exam.  Otherwise only request/complaint today is to be able to take a shower.  Significant studies: 7/29>> MRI brain: Interval decrease in size of multiple lesions involving the bilateral cerebral hemispheres/brainstem and cerebellum-Constellation of findings suggest continued response to therapy.  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  Due to ongoing need for IV treatment with antibiotics, further imaging, medication management and evaluation with ophthalmology.  Barriers to Discharge: HIV AIDS with encephalopathy/ataxia-needing inpatient work-up including LP/MRI  Antimicrobial agents: Anti-infectives (From admission, onward)   Start     Dose/Rate Route Frequency Ordered Stop   04/18/20 0000  doravirine (PIFELTRO) 100 MG  TABS tablet     Discontinue     100 mg Oral Daily 04/18/20 1504     04/15/20 1500  doravirine (PIFELTRO) tablet 100 mg     Discontinue     100 mg Oral Daily 04/15/20 1428     04/12/20 1000  dapsone tablet 100 mg     Discontinue     100 mg Oral Daily 04/11/20 1901     04/12/20 0800  pyrimethamine (DARAPRIM) tablet 50 mg     Discontinue      50 mg Oral Daily with breakfast 04/11/20 1901     04/11/20 2200  clindamycin (CLEOCIN) capsule 600 mg     Discontinue     600 mg Oral 3 times daily 04/11/20 1901     04/11/20 1915  bictegravir-emtricitabine-tenofovir AF (BIKTARVY) 50-200-25 MG per tablet 1 tablet     Discontinue     1 tablet Oral Daily 04/11/20 1901        MEDICATIONS: Scheduled Meds:  bictegravir-emtricitabine-tenofovir AF  1 tablet Oral Daily   clindamycin  600 mg Oral TID   dapsone  100 mg Oral Daily   doravirine  100 mg Oral Daily   enoxaparin (LOVENOX) injection  40 mg Subcutaneous Q24H   leucovorin  25 mg Oral Daily   metoprolol tartrate  12.5 mg Oral BID   pyrimethamine  50 mg Oral Q breakfast   sodium chloride flush  3 mL Intravenous Q12H   Continuous Infusions:  sodium chloride 10 mL/hr at 04/17/20 1718   methylPREDNISolone (SOLU-MEDROL) injection 500 mg (04/19/20 0520)   PRN Meds:.sodium chloride, acetaminophen **OR** acetaminophen, labetalol, ondansetron **OR** ondansetron (ZOFRAN) IV, sodium chloride flush   PHYSICAL EXAM: Vital signs: Vitals:   04/18/20 0720 04/18/20 1526 04/18/20 2017 04/19/20 0510  BP: 132/71 131/81 133/87 106/64  Pulse: 89 87 96 78  Resp: 19 19 19 18   Temp: 98.6 F (37 C) 97.7 F (36.5 C) 98.3 F (36.8 C) 97.7 F (36.5 C)  TempSrc:  Oral Oral Oral  SpO2: 95% 96% 92% 95%  Weight:      Height:       Filed Weights   04/12/20 0400  Weight: 91.5 kg   Body mass index is 33.57 kg/m.   General:  Pleasantly resting in bed, No acute distress. HEENT: Left eye visual field blurry on exam, left lateral strabismus noted minimally with diplopia noted on gross physical exam Neck:  Without mass or deformity.  Trachea is midline. Lungs:  Clear to auscultate bilaterally without rhonchi, wheeze, or rales. Heart:  Regular rate and rhythm.  Without murmurs, rubs, or gallops. Abdomen:  Soft, nontender, nondistended.  Without guarding or rebound. Extremities: Without  cyanosis, clubbing, edema, or obvious deformity. Vascular:  Dorsalis pedis and posterior tibial pulses palpable bilaterally. Skin:  Warm and dry, no erythema, no ulcerations.   I have personally reviewed following labs and imaging studies  LABORATORY DATA: CBC: Recent Labs  Lab 04/15/20 0737 04/18/20 0505  WBC 9.9 21.4*  HGB 14.4 13.6  HCT 44.7 41.5  MCV 91.8 93.3  PLT 310 956    Basic Metabolic Panel: Recent Labs  Lab 04/15/20 0737 04/18/20 0505  NA 139 141  K 4.1 3.8  CL 104 107  CO2 26 23  GLUCOSE 90 175*  BUN 12 20  CREATININE 0.93 0.95  CALCIUM 9.1 8.6*    GFR: Estimated Creatinine Clearance: 118.2 mL/min (by C-G formula based on SCr of 0.95 mg/dL).  Liver Function Tests: Recent Labs  Lab  04/18/20 0505  AST 36  ALT 58*  ALKPHOS 86  BILITOT 0.9  PROT 6.2*  ALBUMIN 3.3*   No results for input(s): LIPASE, AMYLASE in the last 168 hours. No results for input(s): AMMONIA in the last 168 hours.  Coagulation Profile: No results for input(s): INR, PROTIME in the last 168 hours.  Cardiac Enzymes: No results for input(s): CKTOTAL, CKMB, CKMBINDEX, TROPONINI in the last 168 hours.  BNP (last 3 results) No results for input(s): PROBNP in the last 8760 hours.  Lipid Profile: No results for input(s): CHOL, HDL, LDLCALC, TRIG, CHOLHDL, LDLDIRECT in the last 72 hours.  Thyroid Function Tests: No results for input(s): TSH, T4TOTAL, FREET4, T3FREE, THYROIDAB in the last 72 hours.  Anemia Panel: No results for input(s): VITAMINB12, FOLATE, FERRITIN, TIBC, IRON, RETICCTPCT in the last 72 hours.  Urine analysis:    Component Value Date/Time   COLORURINE YELLOW 08/12/2018 1918   APPEARANCEUR CLEAR 08/12/2018 1918   LABSPEC 1.010 08/12/2018 1918   PHURINE 6.0 08/12/2018 1918   GLUCOSEU NEGATIVE 08/12/2018 1918   HGBUR NEGATIVE 08/12/2018 1918   BILIRUBINUR NEGATIVE 08/12/2018 1918   KETONESUR NEGATIVE 08/12/2018 1918   PROTEINUR NEGATIVE 08/12/2018 1918     NITRITE NEGATIVE 08/12/2018 1918   LEUKOCYTESUR NEGATIVE 08/12/2018 1918    Sepsis Labs: Lactic Acid, Venous No results found for: LATICACIDVEN  MICROBIOLOGY: Recent Results (from the past 240 hour(s))  SARS Coronavirus 2 by RT PCR (hospital order, performed in [facility] hospital lab) Nasopharyngeal Nasopharyngeal Swab     Status: None   Collection Time: 04/11/20 10:11 PM   Specimen: Nasopharyngeal Swab  Result Value Ref Range Status   SARS Coronavirus 2 NEGATIVE NEGATIVE Final    Comment: (NOTE) SARS-CoV-2 target nucleic acids are NOT DETECTED.  The SARS-CoV-2 RNA is generally detectable in upper and lower respiratory specimens during the acute phase of infection. The lowest concentration of SARS-CoV-2 viral copies this assay can detect is 250 copies / mL. A negative result does not preclude SARS-CoV-2 infection and should not be used as the sole basis for treatment or other patient management decisions.  A negative result may occur with improper specimen collection / handling, submission of specimen other than nasopharyngeal swab, presence of viral mutation(s) within the areas targeted by this assay, and inadequate number of viral copies (<250 copies / mL). A negative result must be combined with clinical observations, patient history, and epidemiological information.  Fact Sheet for Patients:   StrictlyIdeas.no  Fact Sheet for Healthcare Providers: BankingDealers.co.za  This test is not yet approved or  cleared by the [place] FDA and has been authorized for detection and/or diagnosis of SARS-CoV-2 by FDA under an Emergency Use Authorization (EUA).  This EUA will remain in effect (meaning this test can be used) for the duration of the COVID-19 declaration under Section 564(b)(1) of the Act, 21 U.S.C. section 360bbb-3(b)(1), unless the authorization is terminated or revoked sooner.  Performed at [facility], [place] [address], [place], [place] [address]   CSF culture     Status: None   Collection Time: 04/15/20 12:17 PM   Specimen: PATH Cytology CSF; Cerebrospinal Fluid  Result Value Ref Range Status   Specimen Description CSF  Final   Special Requests NONE  Final   Gram Stain NO WBC SEEN NO ORGANISMS SEEN CYTOSPIN SMEAR   Final   Culture   Final    NO GROWTH 3 DAYS Performed at [facility], [place] [address]  [place], [place] [address]    Report Status 04/18/2020 FINAL  Final    RADIOLOGY STUDIES/RESULTS: No results found.   LOS: 8 days   Little Ishikawa, DO [facility]  04/19/2020, 8:18 AM

## 2020-04-19 NOTE — Consult Note (Signed)
Ophthalmology Initial Consult Note  Thomas Park, Thomas Park, [place] y.o. male Date of Service:  04/19/2020  Requesting physician: Thomas Ishikawa, MD  Information Obtained from: patient, chart Chief Complaint:  AIDS, Thomas/o ocular involvement  HPI/Discussion:  Thomas Park is a 29 y.o. 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 Ophthalmology was consulted to evaluate for opportunistic infection in the eyes. Patient reports distance VA is a Thomas blurry. He does not wear glasses. He denies flashes, floaters, or curtains. Per chart review, he has a history of right gaze paresis in 2019, which was believed to be due to his CNS disease.  Past Ocular Hx:  Right gaze paresis Ocular Meds:  None Family ocular history: Noncontributory  Past Medical History:  Diagnosis Date  . Anemia    Thomas Park 08/10/2018  . Dysphagia 02/06/2020  . HIV (human immunodeficiency virus infection) ([place])    dx'd 07/2018  . 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 Neuropathy 09/21/2018  . 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 Right sided weakness 09/21/2018  . Tongue lesion 09/21/2018  . 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 NO PAST SURGERIES      Prior to Admission Meds: Medications Prior to Admission  Medication Sig Dispense Refill Last Dose  . acetaminophen (TYLENOL) 325 MG tablet Take 2 tablets (650 mg total) by mouth every 6 (six) hours as needed for mild pain (or Fever >/= 101).   unk at [employer]  . BIKTARVY 50-200-25 MG TABS tablet TAKE 1 TABLET BY MOUTH DAILY (Patient taking differently: Take 1 tablet by mouth daily. ) 30 tablet 11 04/11/2020 at am  . clindamycin (CLEOCIN) 300 MG capsule TAKE 2 CAPSULES BY MOUTH THREE TIMES DAILY (Patient taking differently: Take 600 mg by mouth 3 (three) times daily. ) 240 capsule 5 04/11/2020 at 1200   . dapsone 100 MG tablet TAKE 1 TABLET(100 MG) BY MOUTH DAILY (Patient taking differently: Take 100 mg by mouth daily. ) 30 tablet 1 04/11/2020 at Unknown time  . DARAPRIM 25 MG tablet TAKE 2 TABLETS BY MOUTH DAILY WITH BREAKFAST (Patient taking differently: Take 50 mg by mouth daily with breakfast. ) 60 tablet 5 04/11/2020 at am  . fluconazole (DIFLUCAN) 100 MG tablet TAKE 1 TABLET(100 MG) BY MOUTH DAILY (Patient taking differently: Take 100 mg by mouth daily. ) 14 tablet 1 04/11/2020 at Unknown time  . leucovorin (WELLCOVORIN) 25 MG tablet TAKE 1 TABLET BY MOUTH DAILY (Patient taking differently: Take 25 mg by mouth daily. ) 30 tablet 5 04/11/2020 at Unknown time  . ondansetron (ZOFRAN) 4 MG tablet TAKE 1 TABLET BY MOUTH EVERY 8 HOURS AS NEEDED FOR NAUSEA AND VOMITING (Patient taking differently: Take 4 mg by mouth in the morning, at noon, and at bedtime. ) 90 tablet 3 04/11/2020 at 1200  . gabapentin (NEURONTIN) 100 MG capsule Take 1 capsule (100 mg total) by mouth 2 (two) times daily between meals as needed. (Patient not taking: Reported on 04/11/2020) 60 capsule 1 Not Taking at Unknown time    Inpatient Meds: See chart  Allergies  Allergen Reactions  . Sulfa Antibiotics Rash   Social History   Tobacco Use  . Smoking status: Never Smoker  . Smokeless tobacco: Never Used  Substance Use Topics  . Alcohol use: Yes    Alcohol/week: 6.0 standard drinks  Types: 6 Cans of beer per week    Comment: 08/10/2018 "drink only on weekend"   History reviewed. No pertinent family history.  ROS: Other than ROS in the HPI, all other systems were negative.  Exam: Temp: 97.7 F (36.5 C) Pulse Rate: 72 BP: 137/86 Resp: 17 SpO2: 100 %  Visual Acuity:  near   OD 20/30+   OS 20/30+     OD OS  Confr Vis Fields Full to fingers, difficult Full to fingers, difficult  EOM (Primary) Full, no gaze paresis Full, no gaze paresis  Lids/Lashes WNL WNL  Conjunctiva 1-2+ injection worse nasally and  temporally 1-2+ injection worse nasally and temporally  Adnexa  WNL WNL  Pupils  3 --> 2, somewhat brisk, no rAPD 3 --> 2, somewhat brisk, no rAPD  Cornea  Clear Clear  Anterior Chamber Formed, grossly quiet Formed, grossly quiet  Lens:  Clear Clear  IOP (tonopen) 16 16  Fundus - Dilated? YES   Optic Disc - C:D Ratio 0.6, pink, no edema 0.6, pink, no edema  Post Seg:  Retina                    Vessels Normal caliber Normal caliber                  Vitreous  Clear Clear                  Macula Good FLR Good FLR                  Periphery Normal, no holes or tears, no retinal whitening, no other concerning lesions Normal, no holes or tears, no retinal whitening, no other concerning lesions       Neuro:  Oriented to person, place, and time:  Yes Psychiatric:  Mood and Affect Appropriate:  Yes  Labs/imaging:   A/P:  29 y.o. male with AIDS, Kaposi sarcoma, and encephalopathy - now with normal CD4 count on treatment consulted for:  1) Toxoplasmosis (or other suggestion of opportunistic infection) rule out - Aside from mild conjunctival injection, dilated exam is unremarkable. - No intervention indicated from ophthalmology's perspective. - Might consider artificial tears PRN if patient has surface complaints.  2) Glaucoma suspect - Based on large c/d. - Likely physiologic, but very difficult to diagnose at bedside. Recommend outpatient follow up.  Recommend clinic exam upon discharge, as some patients can have other ocular findings such as immune reconstitution uveitis that are very difficult to diagnose at the bedside. This will also give me a better opportunity to evaluate for glaucoma. Feel free to call my cell if I can be of any further assistance.  Thomas Stabler, MD [phone]  Thomas Wyatt Portela, MD 04/19/2020, 1:51 PM

## 2020-04-19 NOTE — Plan of Care (Signed)
  Problem: Clinical Measurements: Goal: Diagnostic test results will improve Outcome: Not Progressing   Problem: Activity: Goal: Risk for activity intolerance will decrease Outcome: Progressing   Problem: Safety: Goal: Ability to remain free from injury will improve Outcome: Progressing

## 2020-04-19 NOTE — Progress Notes (Signed)
Occupational Therapy Treatment Patient Details Name: Thomas Park MRN: [id number] DOB: [birth year] Today's Date: 04/19/2020    History of present illness Pt is a 29 y.o. 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 C/o blurry vision, plan for ophthalmology consult with formal dilated funduscopic exam. MRI 7/30 with multiple lesions inolving bilateral cerebral hemispheres, brainstem, cerebellum, most pronounced of which are positioned about the basalganglia; extensive white matter disease. 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   OT comments  Patient seated at side of bed when therapist entered the room. Spanish intepreter used via [employer] [phone]. Patient reports his vision continues to be blurry and double and that he only used the glasses " a little bit because they are uncomfortable." Therapist educated patient that he had to get used to them. Therapist wanted patient to ambulate and perform ADLs with occluded glasses donned - but patient unable to state where the glasses were located and they were unable to be found in patient's room. Patient performed significant bed mobility when searching around the bed without difficulty. Patient demonstrated ability to ambulate to the bathroom, perform toilet transfer, toileting and standing at sink with one verbal cue to use soap. Patient holding onto walls and furniture with ambulation and therapist provided min guard for safety -- however, RN reports patient ambulating independently to the bathroom without assistance of nursing staff. Patient demonstrated ability to donn and doff socks without assistance. Patient performing ADLs well and progressing towards goals.   Follow Up Recommendations  Home health OT;Supervision/Assistance - 24 hour;Other (comment)    Equipment Recommendations  None recommended by OT    Recommendations for Other Services       Precautions / Restrictions Precautions Precautions: Fall       Mobility Bed Mobility Overal bed mobility: Independent             General bed mobility comments: Received sitting EOB. Patient independent with bed mobility - able to manuever all over the bed looking for glasses.  Transfers                 General transfer comment: Min guard to ambulate in room for safety. Patient not using RW. Holds onto furniture and walks. Ambulating in room without assistance of nursing staff.    Balance Overall balance assessment: Needs assistance Sitting-balance support: No upper extremity supported;Feet supported Sitting balance-Leahy Scale: Good     Standing balance support: During functional activity Standing balance-Leahy Scale: Fair Standing balance comment: Holds onto walls and furniture with at least one hand a ttime.                           ADL either performed or assessed with clinical judgement   ADL Overall ADL's : Needs assistance/impaired     Grooming: Wash/dry hands;Standing;Min guard Grooming Details (indicate cue type and reason): Patient washed hands standing at sink. Cued to use soap.             Lower Body Dressing: Independent Lower Body Dressing Details (indicate cue type and reason): Patient able to donn and doff socks seated at side of bed. Toilet Transfer: Min Librarian, academic Details (indicate cue type and reason): Therapist provided min guard for activity while in room as patient is unsteady and uses furniture and walls to hold onto to get to bathroom and grab bar to perform transfer  However, Rn reports patient has been performing toileting independently without assistance of nursing staff. Toileting- Clothing Manipulation and Hygiene: Sit to/from stand;Min guard Toileting - Clothing Manipulation Details (indicate cue type and reason): Demonstrates ability to perform toieteting with assistance. min guard for  safety. However, Rn reports patient independently performing toileting without assistance of nursing staff.     Functional mobility during ADLs: Min guard       Vision Patient Visual Report: Diplopia Additional Comments: Patient reports vision is the same. Reports he only wore the occluded glasses a little because it was uncomfortabe and therapist educated patient he had to get used to them.   Perception     Praxis      Cognition Arousal/Alertness: Awake/alert Behavior During Therapy: WFL for tasks assessed/performed Overall Cognitive Status: Difficult to assess                                          Exercises     Shoulder Instructions       General Comments      Pertinent Vitals/ Pain       Pain Assessment: No/denies pain  Home Living                                          Prior Functioning/Environment              Frequency  Min 2X/week        Progress Toward Goals  OT Goals(current goals can now be found in the care plan section)  Progress towards OT goals: Progressing toward goals  Acute Rehab OT Goals Patient Stated Goal: go home OT Goal Formulation: With patient Time For Goal Achievement: 04/26/20 Potential to Achieve Goals: Good  Plan Discharge plan remains appropriate    Co-evaluation                 AM-PAC OT "6 Clicks" Daily Activity     Outcome Measure   Help from another person eating meals?: None Help from another person taking care of personal grooming?: A Little Help from another person toileting, which includes using toliet, bedpan, or urinal?: None Help from another person bathing (including washing, rinsing, drying)?: A Little Help from another person to put on and taking off regular upper body clothing?: None Help from another person to put on and taking off regular lower body clothing?: None 6 Click Score: 22    End of Session Equipment Utilized During Treatment: Gait  belt;Other (comment)  OT Visit Diagnosis: Unsteadiness on feet (R26.81);Other abnormalities of gait and mobility (R26.89);Repeated falls (R29.6);History of falling (Z91.81);Ataxia, unspecified (R27.0);Other symptoms and signs involving cognitive function   Activity Tolerance Patient tolerated treatment well   Patient Left in bed;with call bell/phone within reach   Nurse Communication  (okay to see)        Time: 1436-1450 OT Time Calculation (min): 14 min  Charges: OT General Charges $OT Visit: 1 Visit OT Treatments $Self Care/Home Management : 8-22 mins  Derl Barrow, OTR/L [place]  Office [phone] Pager: [place] 04/19/2020, 3:59 PM

## 2020-04-19 NOTE — Progress Notes (Signed)
Subjective: No new complaints   Antibiotics:  Anti-infectives (From admission, onward)   Start     Dose/Rate Route Frequency Ordered Stop   04/18/20 0000  doravirine (PIFELTRO) 100 MG TABS tablet     Discontinue     100 mg Oral Daily 04/18/20 1504     04/15/20 1500  doravirine (PIFELTRO) tablet 100 mg     Discontinue     100 mg Oral Daily 04/15/20 1428     04/12/20 1000  dapsone tablet 100 mg     Discontinue     100 mg Oral Daily 04/11/20 1901     04/12/20 0800  pyrimethamine (DARAPRIM) tablet 50 mg     Discontinue     50 mg Oral Daily with breakfast 04/11/20 1901     04/11/20 2200  clindamycin (CLEOCIN) capsule 600 mg     Discontinue     600 mg Oral 3 times daily 04/11/20 1901     04/11/20 1915  bictegravir-emtricitabine-tenofovir AF (BIKTARVY) 50-200-25 MG per tablet 1 tablet     Discontinue     1 tablet Oral Daily 04/11/20 1901        Medications: Scheduled Meds: . bictegravir-emtricitabine-tenofovir AF  1 tablet Oral Daily  . clindamycin  600 mg Oral TID  . dapsone  100 mg Oral Daily  . doravirine  100 mg Oral Daily  . enoxaparin (LOVENOX) injection  40 mg Subcutaneous Q24H  . leucovorin  25 mg Oral Daily  . metoprolol tartrate  12.5 mg Oral BID  . pyrimethamine  50 mg Oral Q breakfast  . sodium chloride flush  3 mL Intravenous Q12H   Continuous Infusions: . sodium chloride 10 mL/hr at 04/17/20 1718  . methylPREDNISolone (SOLU-MEDROL) injection 500 mg (04/19/20 0520)   PRN Meds:.sodium chloride, acetaminophen **OR** acetaminophen, labetalol, ondansetron **OR** ondansetron (ZOFRAN) IV, sodium chloride flush    Objective: Weight change:   Intake/Output Summary (Last 24 hours) at 04/19/2020 1603 Last data filed at 04/19/2020 1300 Gross per 24 hour  Intake 720 ml  Output --  Net 720 ml   Blood pressure (!) 132/99, pulse 83, temperature 98.5 F (36.9 C), temperature source Oral, resp. rate 16, height 5\' 5"  (1.651 m), weight 91.5 kg, SpO2 94 %. Temp:   [97.7 F (36.5 C)-98.5 F (36.9 C)] 98.5 F (36.9 C) (08/06 1458) Pulse Rate:  [72-96] 83 (08/06 1458) Resp:  [16-19] 16 (08/06 1458) BP: (106-137)/(64-99) 132/99 (08/06 1458) SpO2:  [92 %-100 %] 94 % (08/06 1458) FiO2 (%):  [19 %] 19 % (08/05 2017)  Physical Exam: General: Alert and awake HEENT: anicteric sclera, EOMI though with blurry vision CVS regular rate, normal  Chest: , no wheezing, no respiratory distress Abdomen: soft non-distended,  Extremities: no edema or deformity noted bilaterally Skin: no rashes  Neuro: No new focal findings  CBC:    BMET Recent Labs    04/18/20 0505  NA 141  K 3.8  CL 107  CO2 23  GLUCOSE 175*  BUN 20  CREATININE 0.95  CALCIUM 8.6*     Liver Panel  Recent Labs    04/18/20 0505  PROT 6.2*  ALBUMIN 3.3*  AST 36  ALT 58*  ALKPHOS 86  BILITOT 0.9       Sedimentation Rate No results for input(s): ESRSEDRATE in the last 72 hours. C-Reactive Protein No results for input(s): CRP in the last 72 hours.  Micro Results: Recent Results (from the past 720 hour(s))  SARS Coronavirus 2 by RT PCR (hospital order, performed in [facility] hospital lab) Nasopharyngeal Nasopharyngeal Swab     Status: None   Collection Time: 04/11/20 10:11 PM   Specimen: Nasopharyngeal Swab  Result Value Ref Range Status   SARS Coronavirus 2 NEGATIVE NEGATIVE Final    Comment: (NOTE) SARS-CoV-2 target nucleic acids are NOT DETECTED.  The SARS-CoV-2 RNA is generally detectable in upper and lower respiratory specimens during the acute phase of infection. The lowest concentration of SARS-CoV-2 viral copies this assay can detect is 250 copies / mL. A negative result does not preclude SARS-CoV-2 infection and should not be used as the sole basis for treatment or other patient management decisions.  A negative result may occur with improper specimen collection / handling, submission of specimen other than nasopharyngeal swab, presence of viral  mutation(s) within the areas targeted by this assay, and inadequate number of viral copies (<250 copies / mL). A negative result must be combined with clinical observations, patient history, and epidemiological information.  Fact Sheet for Patients:   StrictlyIdeas.no  Fact Sheet for Healthcare Providers: BankingDealers.co.za  This test is not yet approved or  cleared by the [place] FDA and has been authorized for detection and/or diagnosis of SARS-CoV-2 by FDA under an Emergency Use Authorization (EUA).  This EUA will remain in effect (meaning this test can be used) for the duration of the COVID-19 declaration under Section 564(b)(1) of the Act, 21 U.S.C. section 360bbb-3(b)(1), unless the authorization is terminated or revoked sooner.  Performed at [facility], [place] [address] [place], [place] [address]   CSF culture     Status: None   Collection Time: 04/15/20 12:17 PM   Specimen: PATH Cytology CSF; Cerebrospinal Fluid  Result Value Ref Range Status   Specimen Description CSF  Final   Special Requests NONE  Final   Gram Stain NO WBC SEEN NO ORGANISMS SEEN CYTOSPIN SMEAR   Final   Culture   Final    NO GROWTH 3 DAYS Performed at [facility], [place] [address] [place], [place] [address]    Report Status 04/18/2020 FINAL  Final    Studies/Results: MR BRAIN W WO CONTRAST  Result Date: 04/19/2020 CLINICAL DATA:  Toxoplasmosis. EXAM: MRI HEAD WITHOUT AND WITH CONTRAST TECHNIQUE: Multiplanar, multiecho pulse sequences of the brain and surrounding structures were obtained without and with intravenous contrast. CONTRAST:  62mL GADAVIST GADOBUTROL 1 MMOL/ML IV SOLN COMPARISON:  04/12/2020 MRI head and prior. FINDINGS: Image quality is degraded by motion artifact. Brain: Multifocal T2 hyperintense foci involving the basal ganglia, thalami, cerebellum, brainstem and right occipitotemporal region are unchanged. Reference  left basal ganglia lesion measuring 1.5 x 0.9 cm (6:15) is unchanged. Associated micronodular and thin peripheral enhancement most prominent in the left basal ganglia lesion (11:25) is less conspicuous than prior exam. No new lesions. No new focus of abnormal enhancement. Bilateral cerebral convexity pachymeningeal prominence is unchanged. No diffusion-weighted signal abnormality. Bilateral basal ganglia SWI signal dropout is unchanged. Mild diffuse parenchymal volume loss with ex vacuo dilatation, stable from prior exam and advanced for patient's age. Confluent T2/FLAIR hyperintense supratentorial white matter signal is unchanged. No midline shift, ventriculomegaly or extra-axial fluid collection. Vascular: Grossly normal flow voids. Skull and upper cervical spine: Within normal limits. Sinuses/Orbits: Normal orbits. Clear paranasal sinuses. No mastoid effusion. Other: None. IMPRESSION: 1. Motion artifact limits evaluation. 2. Multifocal T2 hyperintensities involving the basal ganglia, thalami, cerebellum, brainstem and right occipitotemporal region are unchanged in  size. Associated micronodular/thin peripheral enhancement is less conspicuous than prior exam. No new lesions. 3. 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 4. Mild cerebral volume loss is unchanged and advanced for patient's age. Electronically Signed   By: Primitivo Gauze M.D.   On: 04/19/2020 10:14      Assessment/Plan:  INTERVAL HISTORY:   Repeat MRI shows no improvement in his extensive white matter disease  [name] from ophthalmology has seen the patient and ruled out ocular OI though they Dr. Carolynn Sayers suspects he may have, and that he also needs further evaluation in the ophthalmology clinic.  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  1.  White matter changes on MRI:  Differential included JC virus related PML though with successful viral suppression systemically seems unlikely  JC virus was negative for the second time  IRIS to JC? Possible though would have shown iris much earlier in his course.  He was diagnosed in 2019.  In December of that year he had a viral load of 669,000 but within a month of antegrade strand transfer inhibitor therapy his viral load was down to 199 and CD4 did come up from 10 to 90.  His virus was imperfectly suppressed and CD4 count did take a while to come up but is now been above 200 since January in fact it now nearing 500  CD8 mediated encephalopathy possible  Pathology did not show malignant cells flow cytology is pending  CMV seems unlikely, I see he has SOME CMV in blood but I doubt this is a CMV CNS infection  CSF has been taken as a 3 white cells seen.  CSF will be [facility] to [facility] for HIV RNA since HIV viral escape is also a leading possible cause here   steroids started  in case he does have something such as IRIS, CD8 encephalitis that would respond to therapy  Intensifiedhis ARV with Doravarine in case there is CSF escape virus  Dr. Larey Brick had recommended funduscopic eye exam to help [place] elucidate what was going on in his CNS and Dr Katy Fitch has examined the patient thoroughly today  If he has no improvement in next 24 hours of high dose solumedrol would stop his steroids (solumedrol)  Also would stop his toxoplasma drugs and PCP prevention (DC clinda, pyramethamine and dapsone   2 HIV: IMperfectly but fairly well  controlled most recent viral load 110 and CD4 count nearly 500.  See above discussion we are intensifying regimen with DOravirine and DC ing his TOxo rx if steroids are stopped  3 Toxoplasmosis: He has successfully completed therapy  For this.  #4 blurry vision: Greatly appreciate Dr. Carolynn Sayers seeing the patient apparently the patient may have some glaucoma as well.  He will follow up with ophthalmology in the outpatient world.  I hope his lack of insurance will not be a barrier to follow-up  Dr Megan Salon will touch base with team tomorrow to see if any improvement and if not DC steroids,  DC toxo drugs PCP prevnetion and DC on Biktarvy and PIfeltro.  I reviewed this with the patient and Barbette Hair was also going to call pts sisters as she was not in room when I cam to round.   [place] has an appointment on   [place] for Infectious Disease is located in the [facility] at  [address] in [place].  [address], which is located to the left of the elevators.  Phone: [phone]  Fax: [phone]  [email or web address]     LOS: 8 days   Alcide Evener 04/19/2020, 4:03 PM

## 2020-04-19 NOTE — TOC Initial Note (Signed)
Transition of Care [facility]) - Initial/Assessment Note    Patient Details  Name: Thomas Park MRN: [id number] Date of Birth: 10/27/1990  [facility] CM/SW Contact:    Thomas Labrum, RN Phone Number: 04/19/2020, 4:00 PM  Clinical Narrative:                 Case management met with the patient using Electronic Spanish interpreter with the IPAD in the room.  The patient was awake and alert and continues with medical workup and evaluations.  I spoke with Thomas Park outpatient PT offered through [facility] and he will need to be set up with outpatient free PT through the [facility] clinic before he is discharged home by having case management call [phone] and set up services.  The patient will also need a charity Rolling walker for home.  I will continue to follow for discharge needs.  Expected Discharge Plan: [place] Barriers to Discharge: Continued Medical Work up   Patient Goals and CMS Choice Patient states their goals for this hospitalization and ongoing recovery are:: Patient plans to discharge home with family CMS Medicare.gov Compare Post Acute Care list provided to:: Patient Choice offered to / list presented to : Patient  Expected Discharge Plan and Services Expected Discharge Plan: [place]   Discharge Planning Services: [facility], CM Consult, Medication Assistance Post Acute Care Choice: Durable Medical Equipment Living arrangements for the past 2 months: Single Family Home                                      Prior Living Arrangements/Services Living arrangements for the past 2 months: Single Family Home Lives with:: Relatives Patient language and need for interpreter reviewed:: Yes Do you feel safe going back to the place where you live?: Yes      Need for Family Participation in Patient Care: Yes (Comment) Care giver support system in place?: Yes (comment)   Criminal  Activity/Legal Involvement Pertinent to Current Situation/Hospitalization: No - Comment as needed  Activities of Daily Living Home Assistive Devices/Equipment: None ADL Screening (condition at time of admission) Patient's cognitive ability adequate to safely complete daily activities?: Yes Is the patient deaf or have difficulty hearing?: No Does the patient have difficulty seeing, even when wearing glasses/contacts?: No Does the patient have difficulty concentrating, remembering, or making decisions?: No Patient able to express need for assistance with ADLs?: Yes Does the patient have difficulty dressing or bathing?: No Independently performs ADLs?: Yes (appropriate for developmental age) Does the patient have difficulty walking or climbing stairs?: Yes Weakness of Legs: None Weakness of Arms/Hands: None  Permission Sought/Granted Permission sought to share information with : Case Manager Permission granted to share information with : Yes, Verbal Permission Granted     Permission granted to share info w AGENCY: [facility]  Permission granted to share info w Relationship: family     Emotional Assessment Appearance:: Appears stated age   Affect (typically observed): Quiet Orientation: : Oriented to Self, Oriented to Place, Oriented to  Time, Oriented to Situation Alcohol / Substance Use: Not Applicable Psych Involvement: No (comment)  Admission diagnosis:  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[place]) [B58.2] Patient Active Problem List   Diagnosis Date Noted   Blurry vision    White matter abnormality on MRI of brain 02/29/2020   Cognitive decline 02/29/2020   Visual  disturbance 02/29/2020   Dysphagia 88/07/314   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[place]) 09/21/2018   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   Tongue lesion 09/21/2018   Right sided weakness 09/21/2018   Neuropathy 09/21/2018   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 [place] [address] Phone: [phone] Fax: [place], [place] [address] Phone: [phone] Fax: [phone]     Social Determinants of Health (SDOH) Interventions    Readmission Risk Interventions Readmission Risk Prevention Plan 04/16/2020  Transportation Screening Complete  PCP or Specialist Appt within 5-7 Days Complete  Home Care Screening Complete  Medication Review (RN CM) Complete

## 2020-04-19 NOTE — Progress Notes (Signed)
Subjective: No new complaints   Antibiotics:  Anti-infectives (From admission, onward)   Start     Dose/Rate Route Frequency Ordered Stop   04/18/20 0000  doravirine (PIFELTRO) 100 MG TABS tablet     Discontinue     100 mg Oral Daily 04/18/20 1504     04/15/20 1500  doravirine (PIFELTRO) tablet 100 mg     Discontinue     100 mg Oral Daily 04/15/20 1428     04/12/20 1000  dapsone tablet 100 mg     Discontinue     100 mg Oral Daily 04/11/20 1901     04/12/20 0800  pyrimethamine (DARAPRIM) tablet 50 mg     Discontinue     50 mg Oral Daily with breakfast 04/11/20 1901     04/11/20 2200  clindamycin (CLEOCIN) capsule 600 mg     Discontinue     600 mg Oral 3 times daily 04/11/20 1901     04/11/20 1915  bictegravir-emtricitabine-tenofovir AF (BIKTARVY) 50-200-25 MG per tablet 1 tablet     Discontinue     1 tablet Oral Daily 04/11/20 1901        Medications: Scheduled Meds: . bictegravir-emtricitabine-tenofovir AF  1 tablet Oral Daily  . clindamycin  600 mg Oral TID  . dapsone  100 mg Oral Daily  . doravirine  100 mg Oral Daily  . enoxaparin (LOVENOX) injection  40 mg Subcutaneous Q24H  . leucovorin  25 mg Oral Daily  . metoprolol tartrate  12.5 mg Oral BID  . pyrimethamine  50 mg Oral Q breakfast  . sodium chloride flush  3 mL Intravenous Q12H   Continuous Infusions: . sodium chloride 10 mL/hr at 04/17/20 1718  . methylPREDNISolone (SOLU-MEDROL) injection 500 mg (04/19/20 0520)   PRN Meds:.sodium chloride, acetaminophen **OR** acetaminophen, labetalol, ondansetron **OR** ondansetron (ZOFRAN) IV, sodium chloride flush    Objective: Weight change:   Intake/Output Summary (Last 24 hours) at 04/19/2020 1615 Last data filed at 04/19/2020 1300 Gross per 24 hour  Intake 720 ml  Output --  Net 720 ml   Blood pressure (!) 132/99, pulse 83, temperature 98.5 F (36.9 C), temperature source Oral, resp. rate 16, height 5\' 5"  (1.651 m), weight 91.5 kg, SpO2 94 %. Temp:   [97.7 F (36.5 C)-98.5 F (36.9 C)] 98.5 F (36.9 C) (08/06 1458) Pulse Rate:  [72-96] 83 (08/06 1458) Resp:  [16-19] 16 (08/06 1458) BP: (106-137)/(64-99) 132/99 (08/06 1458) SpO2:  [92 %-100 %] 94 % (08/06 1458) FiO2 (%):  [19 %] 19 % (08/05 2017)  Physical Exam: General: Alert and awake HEENT: anicteric sclera, EOMI though with blurry vision CVS regular rate, normal  Chest: , no wheezing, no respiratory distress Abdomen: soft non-distended,  Extremities: no edema or deformity noted bilaterally Skin: no rashes  Neuro: No new focal findings  CBC:    BMET Recent Labs    04/18/20 0505  NA 141  K 3.8  CL 107  CO2 23  GLUCOSE 175*  BUN 20  CREATININE 0.95  CALCIUM 8.6*     Liver Panel  Recent Labs    04/18/20 0505  PROT 6.2*  ALBUMIN 3.3*  AST 36  ALT 58*  ALKPHOS 86  BILITOT 0.9       Sedimentation Rate No results for input(s): ESRSEDRATE in the last 72 hours. C-Reactive Protein No results for input(s): CRP in the last 72 hours.  Micro Results: Recent Results (from the past 720 hour(s))  SARS Coronavirus 2 by RT PCR (hospital order, performed in [facility] hospital lab) Nasopharyngeal Nasopharyngeal Swab     Status: None   Collection Time: 04/11/20 10:11 PM   Specimen: Nasopharyngeal Swab  Result Value Ref Range Status   SARS Coronavirus 2 NEGATIVE NEGATIVE Final    Comment: (NOTE) SARS-CoV-2 target nucleic acids are NOT DETECTED.  The SARS-CoV-2 RNA is generally detectable in upper and lower respiratory specimens during the acute phase of infection. The lowest concentration of SARS-CoV-2 viral copies this assay can detect is 250 copies / mL. A negative result does not preclude SARS-CoV-2 infection and should not be used as the sole basis for treatment or other patient management decisions.  A negative result may occur with improper specimen collection / handling, submission of specimen other than nasopharyngeal swab, presence of viral  mutation(s) within the areas targeted by this assay, and inadequate number of viral copies (<250 copies / mL). A negative result must be combined with clinical observations, patient history, and epidemiological information.  Fact Sheet for Patients:   StrictlyIdeas.no  Fact Sheet for Healthcare Providers: BankingDealers.co.za  This test is not yet approved or  cleared by the [place] FDA and has been authorized for detection and/or diagnosis of SARS-CoV-2 by FDA under an Emergency Use Authorization (EUA).  This EUA will remain in effect (meaning this test can be used) for the duration of the COVID-19 declaration under Section 564(b)(1) of the Act, 21 U.S.C. section 360bbb-3(b)(1), unless the authorization is terminated or revoked sooner.  Performed at [facility], [place] [address] [place], [place] [address]   CSF culture     Status: None   Collection Time: 04/15/20 12:17 PM   Specimen: PATH Cytology CSF; Cerebrospinal Fluid  Result Value Ref Range Status   Specimen Description CSF  Final   Special Requests NONE  Final   Gram Stain NO WBC SEEN NO ORGANISMS SEEN CYTOSPIN SMEAR   Final   Culture   Final    NO GROWTH 3 DAYS Performed at [facility], [place] [address], [place], [place] [address]    Report Status 04/18/2020 FINAL  Final    Studies/Results: MR BRAIN W WO CONTRAST  Result Date: 04/19/2020 CLINICAL DATA:  Toxoplasmosis. EXAM: MRI HEAD WITHOUT AND WITH CONTRAST TECHNIQUE: Multiplanar, multiecho pulse sequences of the brain and surrounding structures were obtained without and with intravenous contrast. CONTRAST:  22mL GADAVIST GADOBUTROL 1 MMOL/ML IV SOLN COMPARISON:  04/12/2020 MRI head and prior. FINDINGS: Image quality is degraded by motion artifact. Brain: Multifocal T2 hyperintense foci involving the basal ganglia, thalami, cerebellum, brainstem and right occipitotemporal region are unchanged. Reference  left basal ganglia lesion measuring 1.5 x 0.9 cm (6:15) is unchanged. Associated micronodular and thin peripheral enhancement most prominent in the left basal ganglia lesion (11:25) is less conspicuous than prior exam. No new lesions. No new focus of abnormal enhancement. Bilateral cerebral convexity pachymeningeal prominence is unchanged. No diffusion-weighted signal abnormality. Bilateral basal ganglia SWI signal dropout is unchanged. Mild diffuse parenchymal volume loss with ex vacuo dilatation, stable from prior exam and advanced for patient's age. Confluent T2/FLAIR hyperintense supratentorial white matter signal is unchanged. No midline shift, ventriculomegaly or extra-axial fluid collection. Vascular: Grossly normal flow voids. Skull and upper cervical spine: Within normal limits. Sinuses/Orbits: Normal orbits. Clear paranasal sinuses. No mastoid effusion. Other: None. IMPRESSION: 1. Motion artifact limits evaluation. 2. Multifocal T2 hyperintensities involving the basal ganglia, thalami, cerebellum, brainstem and right occipitotemporal region are unchanged in  size. Associated micronodular/thin peripheral enhancement is less conspicuous than prior exam. No new lesions. 3. 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 4. Mild cerebral volume loss is unchanged and advanced for patient's age. Electronically Signed   By: Primitivo Gauze M.D.   On: 04/19/2020 10:14      Assessment/Plan:  INTERVAL HISTORY:   Repeat MRI shows no improvement in his extensive white matter disease  [name] from ophthalmology has seen the patient and ruled out ocular OI though they Dr. Carolynn Sayers suspects he may have, and that he also needs further evaluation in the ophthalmology clinic.  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  1.  White matter changes on MRI:  Differential included JC virus related PML though with successful viral suppression systemically seems unlikely  JC virus was negative for the second time  IRIS to JC? Possible though would have shown iris much earlier in his course.  He was diagnosed in 2019.  In December of that year he had a viral load of 669,000 but within a month of antegrade strand transfer inhibitor therapy his viral load was down to 199 and CD4 did come up from 10 to 90.  His virus was imperfectly suppressed and CD4 count did take a while to come up but is now been above 200 since January in fact it now nearing 500  CD8 mediated encephalopathy possible  Pathology did not show malignant cells flow cytology is pending  CMV seems unlikely, I see he has SOME CMV in blood but I doubt this is a CMV CNS infection  CSF has been taken as a 3 white cells seen.  CSF will be sent to [facility] for HIV RNA since HIV viral escape is also a leading possible cause here   steroids started  in case he does have something such as IRIS, CD8 encephalitis that would respond to therapy  Intensifiedhis ARV with Doravarine in case there is CSF escape virus  Dr. Larey Brick had recommended funduscopic eye exam to help [place] elucidate what was going on in his CNS and Dr Katy Fitch has examined the patient thoroughly today  If he has no improvement in next 24 hours of high dose solumedrol would stop his steroids (solumedrol)  Also would stop his toxoplasma drugs and PCP prevention (DC clinda, pyramethamine and dapsone   2 HIV: IMperfectly but fairly well  controlled most recent viral load 110 and CD4 count nearly 500.  See above discussion we are intensifying regimen with DOravirine and DC ing his TOxo rx if steroids are stopped  3 Toxoplasmosis: He has successfully completed therapy  For this.  #4 blurry vision: Greatly appreciate Dr. Carolynn Sayers seeing the patient apparently the patient may have some glaucoma as well.  He will follow up with ophthalmology in the outpatient world.  I hope his lack of insurance will not be a barrier to follow-up  Dr Megan Salon will touch base with team tomorrow to see if any improvement and if not DC steroids,  DC toxo drugs PCP prevnetion and DC on Biktarvy and PIfeltro.  I reviewed this with the patient and Barbette Hair was also going to call pts sisters as she was not in room when I cam to round.   Rose Bud has an appointment on 04/25/2020 at [place] with Development worker, community and with me at 1130.   Family and he should bring documentation for his HIV Medication Assistance Program renewal along with his current medications.   He should arrive 15 minutes before his appt in the clinic.  The [facility] is located in the [facility] at  [place] in [place].  [address], which is located to the left of the elevators.  Phone: [phone]  Fax: [phone]  [email or web address]     LOS: 8 days   Alcide Evener 04/19/2020, 4:15 PM

## 2020-04-19 NOTE — Progress Notes (Signed)
PT Cancellation Note  Patient Details Name: Thomas Park MRN: [id number] DOB: 11-Jan-1991   Cancelled Treatment:    Reason Eval/Treat Not Completed: Patient at procedure or test/unavailable. Will follow-up for PT treatment as schedule permits.  Mabeline Caras, PT, DPT Acute Rehabilitation Services  Pager [phone] Office [place] 04/19/2020, 9:33 AM

## 2020-04-20 DIAGNOSIS — I10 Essential (primary) hypertension: Secondary | ICD-10-CM

## 2020-04-20 DIAGNOSIS — R42 Dizziness and giddiness: Secondary | ICD-10-CM

## 2020-04-20 DIAGNOSIS — B5889 Toxoplasmosis with other organ involvement: Secondary | ICD-10-CM

## 2020-04-20 DIAGNOSIS — H532 Diplopia: Secondary | ICD-10-CM

## 2020-04-20 LAB — CBC
HCT: 39.3 % (ref 39.0–52.0)
Hemoglobin: 12.6 g/dL — ABNORMAL LOW (ref 13.0–17.0)
MCH: 29.3 pg (ref 26.0–34.0)
MCHC: 32.1 g/dL (ref 30.0–36.0)
MCV: 91.4 fL (ref 80.0–100.0)
Platelets: 322 10*3/uL (ref 150–400)
RBC: 4.3 MIL/uL (ref 4.22–5.81)
RDW: 14.1 % (ref 11.5–15.5)
WBC: 14.3 10*3/uL — ABNORMAL HIGH (ref 4.0–10.5)
nRBC: 0.1 % (ref 0.0–0.2)

## 2020-04-20 LAB — GLUCOSE, CAPILLARY: Glucose-Capillary: 128 mg/dL — ABNORMAL HIGH (ref 70–99)

## 2020-04-20 NOTE — Progress Notes (Signed)
Patient ID: Thomas Park, male   DOB: [birth year], 29 y.o.   MRN: [id number]         [facility]  Date of Admission:  04/11/2020           Day 9 Solu-Medrol ASSESSMENT: Even though he says he is feeling better it does not appear that he is gotten any benefit from a trial of high-dose steroids.  Repeat MRI yesterday did not show any improvement in his white matter changes and it sounds like his ataxia is the same or even slightly worse.  I see no benefit to continuing Solu-Medrol or toxoplasma therapy.  PLAN: 1. Discontinue Solu-Medrol, clindamycin, pyrimethamine, leucovorin and dapsone 2. 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 bictegravir-emtricitabine-tenofovir AF  1 tablet Oral Daily  . clindamycin  600 mg Oral TID  . dapsone  100 mg Oral Daily  . doravirine  100 mg Oral Daily  . enoxaparin (LOVENOX) injection  40 mg Subcutaneous Q24H  . leucovorin  25 mg Oral Daily  . metoprolol tartrate  12.5 mg Oral BID  . pyrimethamine  50 mg Oral Q breakfast  . sodium chloride flush  3 mL Intravenous Q12H   Continuous Infusions: . sodium chloride 10 mL/hr at 04/17/20 1718  . methylPREDNISolone (SOLU-MEDROL) injection 500 mg (04/20/20 0507)   PRN Meds:.sodium chloride, acetaminophen **OR** acetaminophen, labetalol, ondansetron **OR** ondansetron (ZOFRAN) IV, sodium chloride flush   SUBJECTIVE: Thomas Park says that he is feeling better.  He walked in the hallway with physical therapy today.  He says that he was able to walk more quickly than when he was admitted to the hospital.  Review of Systems: Review of Systems  Constitutional: Negative for fever.  Eyes: Positive for double vision. Negative for blurred vision.    Neurological: Positive for dizziness. Negative for headaches.    Allergies  Allergen Reactions  . Sulfa Antibiotics Rash    OBJECTIVE: Vitals:   04/19/20 2028 04/20/20 0430 04/20/20 0820 04/20/20 1401  BP: (!) 142/78 (!) 133/49 (!) 126/107 132/88  Pulse: 79 75 81 84  Resp: 18 16 18 18   Temp: 98.2 F (36.8 C) 97.9 F (36.6 C) 98 F (36.7 C) 97.8 F (36.6 C)  TempSrc: Oral Axillary Oral Oral  SpO2: 96% 100% 95% 94%  Weight:      Height:       Body mass index is 33.57 kg/m.  Physical Exam Constitutional:      Comments: He is sitting up in bed watching videos on his phone.  I interviewed him with the aid of the video interpreter.  Neurological:     Comments: He sways back and forth in bed and has some jerking of his arms.  PT noted that his ataxia grew worse the more he walked.  One of the nurse aides on the floor that has observed him over several days says that his ataxia was definitely not better and probably worse than it was earlier in the week.     Lab Results Lab Results  Component Value Date   WBC 14.3 (H) 04/20/2020   HGB 12.6 (L) 04/20/2020   HCT 39.3 04/20/2020   MCV 91.4 04/20/2020   PLT 322 04/20/2020  Lab Results  Component Value Date   CREATININE 0.95 04/18/2020   BUN 20 04/18/2020   NA 141 04/18/2020   K 3.8 04/18/2020   CL 107 04/18/2020   CO2 23 04/18/2020    Lab Results  Component Value Date   ALT 58 (H) 04/18/2020   AST 36 04/18/2020   ALKPHOS 86 04/18/2020   BILITOT 0.9 04/18/2020     Microbiology: Recent Results (from the past 240 hour(s))  SARS Coronavirus 2 by RT PCR (hospital order, performed in [facility] hospital lab) Nasopharyngeal Nasopharyngeal Swab     Status: None   Collection Time: 04/11/20 10:11 PM   Specimen: Nasopharyngeal Swab  Result Value Ref Range Status   SARS Coronavirus 2 NEGATIVE NEGATIVE Final    Comment: (NOTE) SARS-CoV-2 target nucleic acids are NOT DETECTED.  The SARS-CoV-2 RNA is generally  detectable in upper and lower respiratory specimens during the acute phase of infection. The lowest concentration of SARS-CoV-2 viral copies this assay can detect is 250 copies / mL. A negative result does not preclude SARS-CoV-2 infection and should not be used as the sole basis for treatment or other patient management decisions.  A negative result may occur with improper specimen collection / handling, submission of specimen other than nasopharyngeal swab, presence of viral mutation(s) within the areas targeted by this assay, and inadequate number of viral copies (<250 copies / mL). A negative result must be combined with clinical observations, patient history, and epidemiological information.  Fact Sheet for Patients:   StrictlyIdeas.no  Fact Sheet for Healthcare Providers: BankingDealers.co.za  This test is not yet approved or  cleared by the [place] FDA and has been authorized for detection and/or diagnosis of SARS-CoV-2 by FDA under an Emergency Use Authorization (EUA).  This EUA will remain in effect (meaning this test can be used) for the duration of the COVID-19 declaration under Section 564(b)(1) of the Act, 21 U.S.C. section 360bbb-3(b)(1), unless the authorization is terminated or revoked sooner.  Performed at [facility], [place] [address], [place], [place] [address]   CSF culture     Status: None   Collection Time: 04/15/20 12:17 PM   Specimen: PATH Cytology CSF; Cerebrospinal Fluid  Result Value Ref Range Status   Specimen Description CSF  Final   Special Requests NONE  Final   Gram Stain NO WBC SEEN NO ORGANISMS SEEN CYTOSPIN SMEAR   Final   Culture   Final    NO GROWTH 3 DAYS Performed at [facility], [place] [address] [place], [place] [address]    Report Status 04/18/2020 FINAL  Final    Michel Bickers, MD [facility] [place] [phone] pager    [phone] cell 04/20/2020, 3:15 PM

## 2020-04-20 NOTE — Progress Notes (Signed)
Thomas Park Kitchen  Thomas Park  OVZ:[id number] DOB: 11-22-1990 DOA: 04/11/2020 PCP: Tommy Medal, Lavell Islam, MD   Brief Narrative:  Patient is a 29 y.o. 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  Appears to be improving, also of note is having left eye blurred vision as well as double vision.  Over last 2 days wbc increased significantly despite being on steroids since 8/2   Assessment & Plan:   Active Problems:   Encephalopathy acute   AIDS ([place])   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[place])   White matter abnormality on MRI of brain   Cognitive decline   Blurry vision   Progressive ataxia/cognitive dysfunction, likely multifactorial, POA - Toxoplasmosis infection, Uncontrolled HIV, and possible ongoing viral infection vs HIV encephalitis continue to be top contenders.  - S/P imaging/LP as per Neuro/ID - appreciate insight/recs - LP studies pending to rule out viral etiology,cytology - Continue steroids/ Doravirine/biktarvy per ID - Leukocytosis markedly elevated at 21, previously 9, unclear if lab error or medication reaction, follow repeat labs -Repeat MRI 04/19/2020 shows essentially stable exam with motion artifact, no drastic improvement or worsening of disease  Vision changes/Diplopia/Blurred vision - Optho consulted -[name] agree to follow along - R eye vision WNL per patient - L eye blurry/diplopia when both eyes open -lateral strabismus noted today on exam  CNS toxoplasmosis:  - Continue maintenance medications-remains on clindamycin, dapsone, leucovorin, pyrimethamine.  HIV/AIDS: Remains on treatment per ID as above  HTN/sinus tachy -will start low dose BB -suspect related to anxiety so may not need long term  Morbid Obesity: Estimated body mass index is 33.57 kg/m as calculated from the following:   Height as of this encounter: 5\' 5"  (1.651 m).   Weight as  of this encounter: 91.5 kg.    Significant studies: 7/29>> MRI brain: Interval decrease in size of multiple lesions involving the bilateral cerebral hemispheres/brainstem and cerebellum-Constellation of findings suggest continued response to therapy.  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 - repeat MRI -motion degradation but essentially unchanged 8/4 -ophthalmology to evaluate left eye  DVT prophylaxis: Lovenox SQ  Code Status: full    Code Status Orders  (From admission, onward)         Start     Ordered   04/11/20 1859  Full code  Continuous        04/11/20 1901        Code Status History    Date Active Date Inactive Code Status Order ID Comments User Context   08/18/2018 1729 09/02/2018 1409 Full Code [id number]  Cathlyn Parsons, PA-C Inpatient   08/18/2018 1729 08/18/2018 1729 Full Code [id number]  Cathlyn Parsons, PA-C Inpatient   08/10/2018 2037 08/18/2018 1722 Full Code [id number]  Arrien, Jimmy Picket, MD Inpatient   Advance Care Planning Activity     Family Communication: family member at bedside  Disposition Plan:   Status is: Inpatient  Remains inpatient appropriate because:Ongoing diagnostic testing needed not appropriate for outpatient work up, IV treatments appropriate due to intensity of illness or inability to take PO, Inpatient level of care appropriate due to severity of illness and elevated wbc x 2 days despite steroids for over 5 days   Dispo: The patient is from: Home              Anticipated d/c is to: Home              Anticipated  d/c date is: 2 days              Patient currently is not medically stable to d/c.       Consults called: None Admission status: Inpatient   Consultants:   ID, OPTHALMOLOGY  Procedures:  MR BRAIN W WO CONTRAST  Result Date: 04/19/2020 CLINICAL DATA:  Toxoplasmosis. EXAM: MRI HEAD WITHOUT AND WITH CONTRAST TECHNIQUE: Multiplanar, multiecho pulse sequences of the brain and surrounding structures were obtained without and  with intravenous contrast. CONTRAST:  24mL GADAVIST GADOBUTROL 1 MMOL/ML IV SOLN COMPARISON:  04/12/2020 MRI head and prior. FINDINGS: Image quality is degraded by motion artifact. Brain: Multifocal T2 hyperintense foci involving the basal ganglia, thalami, cerebellum, brainstem and right occipitotemporal region are unchanged. Reference left basal ganglia lesion measuring 1.5 x 0.9 cm (6:15) is unchanged. Associated micronodular and thin peripheral enhancement most prominent in the left basal ganglia lesion (11:25) is less conspicuous than prior exam. No new lesions. No new focus of abnormal enhancement. Bilateral cerebral convexity pachymeningeal prominence is unchanged. No diffusion-weighted signal abnormality. Bilateral basal ganglia SWI signal dropout is unchanged. Mild diffuse parenchymal volume loss with ex vacuo dilatation, stable from prior exam and advanced for patient's age. Confluent T2/FLAIR hyperintense supratentorial white matter signal is unchanged. No midline shift, ventriculomegaly or extra-axial fluid collection. Vascular: Grossly normal flow voids. Skull and upper cervical spine: Within normal limits. Sinuses/Orbits: Normal orbits. Clear paranasal sinuses. No mastoid effusion. Other: None. IMPRESSION: 1. Motion artifact limits evaluation. 2. Multifocal T2 hyperintensities involving the basal ganglia, thalami, cerebellum, brainstem and right occipitotemporal region are unchanged in size. Associated micronodular/thin peripheral enhancement is less conspicuous than prior exam. No new lesions. 3. 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 4. Mild cerebral volume loss is unchanged and advanced for patient's age. Electronically Signed   By: Primitivo Gauze M.D.   On: 04/19/2020 10:14   MR BRAIN W WO CONTRAST  Result Date: 04/12/2020 CLINICAL DATA:  Initial evaluation for progressively worsening cognitive defects, ataxia, not  improving. History of HIV/aids, CNS toxoplasmosis, on treatment. Last CD4 count = 291 on 02/06/2020. Concern for possible IRIS. EXAM: MRI HEAD WITHOUT AND WITH CONTRAST TECHNIQUE: Multiplanar, multiecho pulse sequences of the brain and surrounding structures were obtained without and with intravenous contrast. CONTRAST:  18mL GADAVIST GADOBUTROL 1 MMOL/ML IV SOLN COMPARISON:  Comparison made with most recent brain MRI from 02/19/2020 as well as previous brain MRI from 08/10/2018. FINDINGS: Brain: Examination moderately degraded by motion artifact. Advanced cerebral atrophy for age, stable from previous. Again seen is fairly extensive confluent T2/FLAIR signal abnormality throughout the cerebral white matter without discernible enhancement or mass effect, most characteristic of HIV encephalopathy. Overall, appearance is similar to perhaps mildly improved from previous. Again seen are multiple scattered T2 hyperintense lesions involving the bilateral basal ganglia, thalami, right temporal occipital region, left dorsal midbrain and pons, and cerebellum, most characteristic of CNS toxoplasmosis infection. In comparison with previous exam, these lesions are stable to perhaps slightly decreased from prior. For example, the largest of these lesions is positioned at the left basal ganglia and measures 15 x 9 mm, previously 16 x 10 mm (series 10, image 13). Again seen are scattered foci of peripheral and nodular enhancement about several of these lesions, most pronounced at the left basal ganglia and right temporal occipital region. Overall, the degree of enhancement appears slightly decreased and improved from previous. No new significant surrounding edema. No new lesions identified. Mild diffuse  pachymeningeal enhancement noted overlying both cerebral convexities, similar to previous, and could be related to prior lumbar punctures. No significant left a meningeal enhancement. No evidence for acute or subacute infarct.  Gray-white matter differentiation maintained. No evidence for acute intracranial hemorrhage. No mass effect or midline shift. No hydrocephalus or extra-axial fluid collection. Vascular: Major intracranial vascular flow voids are maintained. Skull and upper cervical spine: Craniocervical junction within normal limits. No focal marrow replacing lesion. No scalp soft tissue abnormality. Sinuses/Orbits: Globes and orbital soft tissues demonstrate no acute finding. Paranasal sinuses are largely clear. No significant mastoid effusion. Other: Non. IMPRESSION: 1. Motion degraded exam. 2. Stable to perhaps slight interval decrease in size of multiple lesions involving the bilateral cerebral hemispheres, brainstem, and cerebellum, most pronounced of which are positioned about the basal ganglia. Associated ring and nodular enhancement is stable to slightly improved. Constellation of findings suggest continued interval response to therapy. No imaging findings to suggest IRIS. 3. Underlying extensive confluent T2/FLAIR hyperintensity throughout the cerebral white matter, most characteristic of HIV encephalopathy. 4. Advanced atrophy for age. Electronically Signed   By: Jeannine Boga M.D.   On: 04/12/2020 04:29   DG FL GUIDED LUMBAR PUNCTURE  Result Date: 04/15/2020 CLINICAL DATA:  Encephalopathy. EXAM: DIAGNOSTIC LUMBAR PUNCTURE UNDER FLUOROSCOPIC GUIDANCE FLUOROSCOPY TIME:  Radiation Exposure Index (if provided by the fluoroscopic device): 29.4 mGy. PROCEDURE: Informed consent was obtained from the patient prior to the procedure, through the use of a translator, including potential complications of headache, allergy, and pain. With the patient prone, the lower back was prepped with Betadine. 1% Lidocaine was used for local anesthesia. Lumbar puncture was performed at the L4-5 level using a 20 gauge needle with return of clear CSF with an opening pressure of 14 cm water. 19 ml of CSF were obtained for laboratory  studies. The patient tolerated the procedure well and there were no apparent complications. IMPRESSION: Under fluoroscopic guidance, lumbar puncture was performed. Electronically Signed   By: Marijo Conception M.D.   On: 04/15/2020 12:20     Antimicrobials:   CLINDA 7/29   Subjective: STILL REPORTS SIGNIFICANT DOUBLE VISION LEFT EYE  Objective: Vitals:   04/19/20 2028 04/20/20 0430 04/20/20 0820 04/20/20 1401  BP: (!) 142/78 (!) 133/49 (!) 126/107 132/88  Pulse: 79 75 81 84  Resp: 18 16 18 18   Temp: 98.2 F (36.8 C) 97.9 F (36.6 C) 98 F (36.7 C) 97.8 F (36.6 C)  TempSrc: Oral Axillary Oral Oral  SpO2: 96% 100% 95% 94%  Weight:      Height:        Intake/Output Summary (Last 24 hours) at 04/20/2020 1511 Last data filed at 04/19/2020 1900 Gross per 24 hour  Intake 240 ml  Output --  Net 240 ml   Filed Weights   04/12/20 0400  Weight: 91.5 kg    Examination:  General exam: Appears calm and comfortable  Respiratory system: Clear to auscultation. Respiratory effort normal. Cardiovascular system: S1 & S2 heard, RRR. No JVD, murmurs, rubs, gallops or clicks. No pedal edema. Gastrointestinal system: Abdomen is nondistended, soft and nontender. No organomegaly or masses felt. Normal bowel sounds heard. Central nervous system: Alert and oriented. Double vision left eye, gross acuity intact Extremities: wwp, no edema Skin: No rashes, lesions or ulcers Psychiatry: Judgement and insight appear normal. Mood & affect appropriate.     Data Reviewed: I have personally reviewed following labs and imaging studies  CBC: Recent Labs  Lab 04/15/20 0737 04/18/20 0505  04/20/20 0421  WBC 9.9 21.4* 14.3*  HGB 14.4 13.6 12.6*  HCT 44.7 41.5 39.3  MCV 91.8 93.3 91.4  PLT 310 338 790   Basic Metabolic Panel: Recent Labs  Lab 04/15/20 0737 04/18/20 0505  NA 139 141  K 4.1 3.8  CL 104 107  CO2 26 23  GLUCOSE 90 175*  BUN 12 20  CREATININE 0.93 0.95  CALCIUM 9.1 8.6*    GFR: Estimated Creatinine Clearance: 118.2 mL/min (by C-G formula based on SCr of 0.95 mg/dL). Liver Function Tests: Recent Labs  Lab 04/18/20 0505  AST 36  ALT 58*  ALKPHOS 86  BILITOT 0.9  PROT 6.2*  ALBUMIN 3.3*   No results for input(s): LIPASE, AMYLASE in the last 168 hours. No results for input(s): AMMONIA in the last 168 hours. Coagulation Profile: No results for input(s): INR, PROTIME in the last 168 hours. Cardiac Enzymes: No results for input(s): CKTOTAL, CKMB, CKMBINDEX, TROPONINI in the last 168 hours. BNP (last 3 results) No results for input(s): PROBNP in the last 8760 hours. HbA1C: No results for input(s): HGBA1C in the last 72 hours. CBG: Recent Labs  Lab 04/16/20 1123 04/16/20 1739 04/17/20 0642 04/17/20 1141 04/17/20 1641  GLUCAP 164* 175* 189* 193* 229*   Lipid Profile: No results for input(s): CHOL, HDL, LDLCALC, TRIG, CHOLHDL, LDLDIRECT in the last 72 hours. Thyroid Function Tests: No results for input(s): TSH, T4TOTAL, FREET4, T3FREE, THYROIDAB in the last 72 hours. Anemia Panel: No results for input(s): VITAMINB12, FOLATE, FERRITIN, TIBC, IRON, RETICCTPCT in the last 72 hours. Sepsis Labs: No results for input(s): PROCALCITON, LATICACIDVEN in the last 168 hours.  Recent Results (from the past 240 hour(s))  SARS Coronavirus 2 by RT PCR (hospital order, performed in [facility] hospital lab) Nasopharyngeal Nasopharyngeal Swab     Status: None   Collection Time: 04/11/20 10:11 PM   Specimen: Nasopharyngeal Swab  Result Value Ref Range Status   SARS Coronavirus 2 NEGATIVE NEGATIVE Final    Comment: (NOTE) SARS-CoV-2 target nucleic acids are NOT DETECTED.  The SARS-CoV-2 RNA is generally detectable in upper and lower respiratory specimens during the acute phase of infection. The lowest concentration of SARS-CoV-2 viral copies this assay can detect is 250 copies / mL. A negative result does not preclude SARS-CoV-2 infection and should not be  used as the sole basis for treatment or other patient management decisions.  A negative result may occur with improper specimen collection / handling, submission of specimen other than nasopharyngeal swab, presence of viral mutation(s) within the areas targeted by this assay, and inadequate number of viral copies (<250 copies / mL). A negative result must be combined with clinical observations, patient history, and epidemiological information.  Fact Sheet for Patients:   StrictlyIdeas.no  Fact Sheet for Healthcare Providers: BankingDealers.co.za  This test is not yet approved or  cleared by the [place] FDA and has been authorized for detection and/or diagnosis of SARS-CoV-2 by FDA under an Emergency Use Authorization (EUA).  This EUA will remain in effect (meaning this test can be used) for the duration of the COVID-19 declaration under Section 564(b)(1) of the Act, 21 U.S.C. section 360bbb-3(b)(1), unless the authorization is terminated or revoked sooner.  Performed at [facility], [place] [address], [place], [place] [address]   CSF culture     Status: None   Collection Time: 04/15/20 12:17 PM   Specimen: PATH Cytology CSF; Cerebrospinal Fluid  Result Value Ref Range Status   Specimen Description  CSF  Final   Special Requests NONE  Final   Gram Stain NO WBC SEEN NO ORGANISMS SEEN CYTOSPIN SMEAR   Final   Culture   Final    NO GROWTH 3 DAYS Performed at [facility], [address] [place], [place] [address]    Report Status 04/18/2020 FINAL  Final         Radiology Studies: MR BRAIN W WO CONTRAST  Result Date: 04/19/2020 CLINICAL DATA:  Toxoplasmosis. EXAM: MRI HEAD WITHOUT AND WITH CONTRAST TECHNIQUE: Multiplanar, multiecho pulse sequences of the brain and surrounding structures were obtained without and with intravenous contrast. CONTRAST:  80mL GADAVIST GADOBUTROL 1 MMOL/ML IV SOLN COMPARISON:   04/12/2020 MRI head and prior. FINDINGS: Image quality is degraded by motion artifact. Brain: Multifocal T2 hyperintense foci involving the basal ganglia, thalami, cerebellum, brainstem and right occipitotemporal region are unchanged. Reference left basal ganglia lesion measuring 1.5 x 0.9 cm (6:15) is unchanged. Associated micronodular and thin peripheral enhancement most prominent in the left basal ganglia lesion (11:25) is less conspicuous than prior exam. No new lesions. No new focus of abnormal enhancement. Bilateral cerebral convexity pachymeningeal prominence is unchanged. No diffusion-weighted signal abnormality. Bilateral basal ganglia SWI signal dropout is unchanged. Mild diffuse parenchymal volume loss with ex vacuo dilatation, stable from prior exam and advanced for patient's age. Confluent T2/FLAIR hyperintense supratentorial white matter signal is unchanged. No midline shift, ventriculomegaly or extra-axial fluid collection. Vascular: Grossly normal flow voids. Skull and upper cervical spine: Within normal limits. Sinuses/Orbits: Normal orbits. Clear paranasal sinuses. No mastoid effusion. Other: None. IMPRESSION: 1. Motion artifact limits evaluation. 2. Multifocal T2 hyperintensities involving the basal ganglia, thalami, cerebellum, brainstem and right occipitotemporal region are unchanged in size. Associated micronodular/thin peripheral enhancement is less conspicuous than prior exam. No new lesions. 3. 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 4. Mild cerebral volume loss is unchanged and advanced for patient's age. Electronically Signed   By: Primitivo Gauze M.D.   On: 04/19/2020 10:14        Scheduled Meds: . bictegravir-emtricitabine-tenofovir AF  1 tablet Oral Daily  . clindamycin  600 mg Oral TID  . dapsone  100 mg Oral Daily  . doravirine  100 mg Oral Daily  . enoxaparin (LOVENOX) injection  40 mg Subcutaneous  Q24H  . leucovorin  25 mg Oral Daily  . metoprolol tartrate  12.5 mg Oral BID  . pyrimethamine  50 mg Oral Q breakfast  . sodium chloride flush  3 mL Intravenous Q12H   Continuous Infusions: . sodium chloride 10 mL/hr at 04/17/20 1718  . methylPREDNISolone (SOLU-MEDROL) injection 500 mg (04/20/20 0507)     LOS: 9 days    Time spent: 15 min    Nicolette Bang, MD [facility]  If 7PM-7AM, please contact night-coverage  04/20/2020, 3:11 PM

## 2020-04-20 NOTE — Progress Notes (Signed)
Physical Therapy Treatment Patient Details Name: Thomas Park MRN: [id number] DOB: [birth year] Today's Date: 04/20/2020    History of Present Illness Pt is a 29 y.o. 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 C/o blurry vision, plan for ophthalmology consult with formal dilated funduscopic exam. MRI 7/30 with multiple lesions inolving bilateral cerebral hemispheres, brainstem, cerebellum, most pronounced of which are positioned about the basalganglia; extensive white matter disease. 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    PT Comments    Patient continues to require assist for balance and safety with all mobility. He had difficulty today following simple commands for balance activity. He ambulated with min a for balance and mod cuing to stay int the walker. He continues to have double vision. His ataxia increases as he ambulated. He would benefit from further skilled safety to improve mobility.   Follow Up Recommendations  Outpatient PT;Supervision for mobility/OOB     Equipment Recommendations  None recommended by PT    Recommendations for Other Services       Precautions / Restrictions Precautions Precautions: None Restrictions Weight Bearing Restrictions: No    Mobility  Bed Mobility Overal bed mobility: Independent Bed Mobility: Supine to Sit;Sit to Supine     Supine to sit: Independent Sit to supine: Independent   General bed mobility comments: Received sitting EOB. Patient independent with bed mobility - able to manuever all over the bed looking for glasses.  Transfers Overall transfer level: Needs assistance Equipment used: Rolling walker (2 wheeled);None Transfers: Sit to/from Stand Sit to Stand: Min guard         General transfer comment: Min guard to ambulate in room for safety. Patient not using RW. Holds onto furniture and walks. Ambulating in room  without assistance of nursing staff.  Ambulation/Gait Ambulation/Gait assistance: Min assist Gait Distance (Feet): 250 Feet Assistive device: Rolling walker (2 wheeled)       General Gait Details: Patient reported double vision while ambualting. Nursing noted while ambualting that he has had this. Min a to stay in walker. Mod cuing when mmaking turns. Decreased control of right leg.    Stairs             Wheelchair Mobility    Modified Rankin (Stroke Patients Only)       Balance Overall balance assessment: Needs assistance Sitting-balance support: No upper extremity supported;Feet supported Sitting balance-Leahy Scale: Good     Standing balance support: During functional activity Standing balance-Leahy Scale: Poor Standing balance comment: required walker to safeley stand                High Level Balance Comments: significant time pspent getting patient in position for balance training. patient had difficulty follwoing smple commands through interpreter. Diffiuclty following simple commands. attmepted to have patient perfrom tandem stance. difficulty positiining his feet in the right position. narrow base able to maintain with min a. Attmepted to have patient coordiate foot moevement but could not follow commands. Seated LAQ and marching in rythm for coordination.             Cognition Arousal/Alertness: Awake/alert Behavior During Therapy: WFL for tasks assessed/performed Overall Cognitive Status: Difficult to assess Area of Impairment: Attention;Safety/judgement;Awareness;Problem solving                 Orientation Level: Place;Time;Disoriented to Current Attention Level: Selective Memory: Decreased short-term memory Following Commands: Follows one step commands consistently Safety/Judgement: Decreased awareness  of safety;Decreased awareness of deficits Awareness: Emergent Problem Solving: Requires verbal cues General Comments: Apparent  cognitive deficits could be exacerbated via Spanish interpreter (ipad). Answering questions and interacting appropriately. Not following gestural commands to be expected. Potentially impulsive with some movement requiring repeated cues      Exercises Other Exercises Other Exercises: Provided [facility] activity/exercise educational handout in [place] and also reviewed verbally utilizing video interpreter and pt completed return demo of exercises as pt endorses vision deficits impacting reading ability. Father also present during session.. Other Exercises: Patient grasped small rolled up balls of paper towel on bedside table - seated at edge of bed - with and without occluded glasses on. Patient reports improvement in vision and ability to perform task with glasses donned. Other Exercises: Patient performed finger to nose with right hand without glasses - so therapist could observe dysmetria. Without glasses exhibited dysmetria with target x 4. With glasses patient exhibited dysmetria x 1. Patient reports improved vision and hitting target with occluded glasses on.    General Comments        Pertinent Vitals/Pain      Home Living                      Prior Function            PT Goals (current goals can now be found in the care plan section) Acute Rehab PT Goals Patient Stated Goal: go home PT Goal Formulation: With patient/family Time For Goal Achievement: 04/26/20 Potential to Achieve Goals: Good Progress towards PT goals: Progressing toward goals    Frequency    Min 3X/week      PT Plan Current plan remains appropriate    Co-evaluation              AM-PAC PT "6 Clicks" Mobility   Outcome Measure  Help needed turning from your back to your side while in a flat bed without using bedrails?: None Help needed moving from lying on your back to sitting on the side of a flat bed without using bedrails?: None Help needed moving to and from a bed to a chair (including a  wheelchair)?: A Little Help needed standing up from a chair using your arms (e.g., wheelchair or bedside chair)?: A Little Help needed to walk in hospital room?: A Little Help needed climbing 3-5 steps with a railing? : A Little 6 Click Score: 20    End of Session Equipment Utilized During Treatment: Gait belt Activity Tolerance: Patient tolerated treatment well Patient left: in bed Nurse Communication: Mobility status PT Visit Diagnosis: Ataxic gait (R26.0);Difficulty in walking, not elsewhere classified (R26.2);Unsteadiness on feet (R26.81);History of falling (Z91.81)     Time: 1133-1202 PT Time Calculation (min) (ACUTE ONLY): 29 min  Charges:  $Gait Training: 8-22 mins $Neuromuscular Re-education: 8-22 mins                        Carney Living pt dpt 04/20/2020, 12:46 PM

## 2020-04-21 LAB — CBC WITH DIFFERENTIAL/PLATELET
Abs Immature Granulocytes: 0.38 10*3/uL — ABNORMAL HIGH (ref 0.00–0.07)
Basophils Absolute: 0.1 10*3/uL (ref 0.0–0.1)
Basophils Relative: 0 %
Eosinophils Absolute: 0 10*3/uL (ref 0.0–0.5)
Eosinophils Relative: 0 %
HCT: 40.2 % (ref 39.0–52.0)
Hemoglobin: 13 g/dL (ref 13.0–17.0)
Immature Granulocytes: 3 %
Lymphocytes Relative: 24 %
Lymphs Abs: 3.5 10*3/uL (ref 0.7–4.0)
MCH: 29.7 pg (ref 26.0–34.0)
MCHC: 32.3 g/dL (ref 30.0–36.0)
MCV: 91.8 fL (ref 80.0–100.0)
Monocytes Absolute: 1.6 10*3/uL — ABNORMAL HIGH (ref 0.1–1.0)
Monocytes Relative: 11 %
Neutro Abs: 9.2 10*3/uL — ABNORMAL HIGH (ref 1.7–7.7)
Neutrophils Relative %: 62 %
Platelets: 314 10*3/uL (ref 150–400)
RBC: 4.38 MIL/uL (ref 4.22–5.81)
RDW: 14.5 % (ref 11.5–15.5)
WBC: 14.7 10*3/uL — ABNORMAL HIGH (ref 4.0–10.5)
nRBC: 0.2 % (ref 0.0–0.2)

## 2020-04-21 LAB — COMPREHENSIVE METABOLIC PANEL
ALT: 44 U/L (ref 0–44)
AST: 22 U/L (ref 15–41)
Albumin: 2.8 g/dL — ABNORMAL LOW (ref 3.5–5.0)
Alkaline Phosphatase: 66 U/L (ref 38–126)
Anion gap: 10 (ref 5–15)
BUN: 21 mg/dL — ABNORMAL HIGH (ref 6–20)
CO2: 25 mmol/L (ref 22–32)
Calcium: 8.1 mg/dL — ABNORMAL LOW (ref 8.9–10.3)
Chloride: 106 mmol/L (ref 98–111)
Creatinine, Ser: 1.12 mg/dL (ref 0.61–1.24)
GFR calc Af Amer: >60 mL/min (ref >60)
GFR calc non Af Amer: >60 mL/min (ref >60)
Glucose, Bld: 85 mg/dL (ref 70–99)
Potassium: 3 mmol/L — ABNORMAL LOW (ref 3.5–5.1)
Sodium: 141 mmol/L (ref 135–145)
Total Bilirubin: 1.1 mg/dL (ref 0.3–1.2)
Total Protein: 5.3 g/dL — ABNORMAL LOW (ref 6.5–8.1)

## 2020-04-21 LAB — GLUCOSE, CAPILLARY: Glucose-Capillary: 90 mg/dL (ref 70–99)

## 2020-04-21 MED ORDER — METOPROLOL TARTRATE 25 MG PO TABS: 12.5000 mg | ORAL_TABLET | Freq: Two times a day (BID) | ORAL | 0 refills | Status: DC

## 2020-04-21 NOTE — Progress Notes (Signed)
Pt verbalized of feeling better. Anxious to go home. Discharge instructions given to pt and his sister with use of interpreter. They verbalized understanding. Discharged to home accompanied by sister.

## 2020-04-21 NOTE — TOC Transition Note (Signed)
Transition of Care [facility]) - CM/SW Discharge Note   Patient Details  Name: Thomas Park MRN: [id number] Date of Birth: 06/26/1991  [facility] CM/SW Contact:  Claudie Leach, RN 04/21/2020, 11:48 AM   Clinical Narrative:    Patient to d/c home today with sister.  Discussed plans with them at bedside using [place] interpreter.  [facility] is not open today.  PT/OT notes emailed to them.  Patient is given phone number and email address to call tomorrow to arrange.  Sister states patient has a walker at home.   Final next level of care: [place] Barriers to Discharge: Continued Medical Work up   Patient Goals and CMS Choice Patient states their goals for this hospitalization and ongoing recovery are:: Patient plans to discharge home with family CMS Medicare.gov Compare Post Acute Care list provided to:: Patient Choice offered to / list presented to : Patient  Discharge Placement                       Discharge Plan and Services   Discharge Planning Services: [facility], CM Consult, Medication Assistance Post Acute Care Choice: Durable Medical Equipment                               Social Determinants of Health (SDOH) Interventions     Readmission Risk Interventions Readmission Risk Prevention Plan 04/16/2020  Transportation Screening Complete  PCP or Specialist Appt within 5-7 Days Complete  Home Care Screening Complete  Medication Review (RN CM) Complete

## 2020-04-21 NOTE — Discharge Summary (Signed)
Physician Discharge Summary  [place] OEV:[id number] DOB: October 06, 1990 DOA: 04/11/2020  PCP: Truman Hayward, MD  Admit date: 04/11/2020 Discharge date: 04/21/2020  Admitted From: Inpatient Disposition: home  Recommendations for Outpatient Follow-up:  1. Follow up with PCP in 1-2 weeks, ID 8/12 at 1130 and fin copunselor at 1100 before that 2. 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 Appears to be improving, also of note is having left eye blurred vision as well as double vision.  Hospital course Progressive ataxia/cognitive dysfunction, likely multifactorial, POA - Toxoplasmosis infection, Uncontrolled HIV, and possible ongoing viral infection vs HIV encephalitis continue to be top contenders.  - S/P imaging/LP as per Neuro/ID - appreciate insight/recs -Repeat MRI 04/19/2020 shows essentially stable exam with motion artifact, no drastic improvement or worsening of disease -Patient was seen by infectious disease in consultation, patient had no significant improvement, discontinued Solu-Medrol, Clinda, pyrimethenamine, and leucovorin as well as dapsone.,  Continue Biktarvy and Pifeltro -Patient has close follow-up with ID on August 12 at 1130 with a financial counselor appointment at 11:00 before that.  Patient was instructed on just using the bedside translator  Vision changes/Diplopia/Blurred vision - Optho consulted -appreciate their input in consultation - R eye vision WNL per patient - L eye blurry/diplopia when both eyes open -no change in exam stable  CNS toxoplasmosis: -ID evaluation appreciate their input  HIV/AIDS: Remains on treatment per  ID as above  HTN/sinus tachy -Continue with low dose BB on discharge -suspect related to anxiety so may not need long term  Morbid Obesity: Estimated body mass index is 33.57 kg/m as calculated from the following: Height as of this encounter: 5\' 5"  (1.651 m). Weight as of this encounter: 91.5 kg.   Significant studies: 7/29>>MRI brain: Interval decrease in size of multiple lesions involving the bilateral cerebral hemispheres/brainstem and cerebellum-Constellation of findings suggest continued response to therapy. 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 - repeat MRI -motion degradation but essentially unchanged 8/4-ophthalmology to evaluate left eye  Discharge Diagnoses:  Active Problems:   Encephalopathy acute   AIDS ([place])   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[place])   White matter abnormality on MRI of brain   Cognitive decline   Blurry vision    Discharge Instructions  Discharge Instructions    Call MD for:   Complete by: As directed    Any change in medical condition or vision   Call MD for:  difficulty breathing, headache or visual disturbances   Complete by: As directed    Call MD for:  hives   Complete by: As directed    Call MD for:  persistant dizziness or light-headedness   Complete by: As directed    Call MD for:  persistant nausea and vomiting   Complete by: As directed    Call MD for:  redness, tenderness, or signs of infection (pain, swelling, redness, odor or green/yellow discharge around incision site)   Complete by: As directed    Call MD for:  temperature >100.4   Complete by: As directed    Diet - low sodium heart healthy   Complete by: As directed    Discharge instructions   Complete by: As directed    Pt to f/up on 8/12 at  1100am with financial counselor and at 1130 with Dr  Von Dam in ID as discussed   Increase activity slowly   Complete by: As directed      Allergies as of 04/21/2020      Reactions   Sulfa Antibiotics Rash      Medication List    STOP taking  these medications   clindamycin 300 MG capsule Commonly known as: CLEOCIN   dapsone 100 MG tablet   Daraprim 25 MG tablet Generic drug: pyrimethamine   leucovorin 25 MG tablet Commonly known as: WELLCOVORIN     TAKE these medications   acetaminophen 325 MG tablet Commonly known as: TYLENOL Take 2 tablets (650 mg total) by mouth every 6 (six) hours as needed for mild pain (or Fever >/= 101).   Biktarvy 50-200-25 MG Tabs tablet Generic drug: bictegravir-emtricitabine-tenofovir AF TAKE 1 TABLET BY MOUTH DAILY   doravirine 100 MG Tabs tablet Commonly known as: PIFELTRO Take 1 tablet (100 mg total) by mouth daily.   fluconazole 100 MG tablet Commonly known as: DIFLUCAN TAKE 1 TABLET(100 MG) BY MOUTH DAILY What changed: See the new instructions.   gabapentin 100 MG capsule Commonly known as: NEURONTIN Take 1 capsule (100 mg total) by mouth 2 (two) times daily between meals as needed.   metoprolol tartrate 25 MG tablet Commonly known as: LOPRESSOR Take 0.5 tablets (12.5 mg total) by mouth 2 (two) times daily.   ondansetron 4 MG tablet Commonly known as: ZOFRAN TAKE 1 TABLET BY MOUTH EVERY 8 HOURS AS NEEDED FOR NAUSEA AND VOMITING What changed:   how much to take  how to take this  when to take this  additional instructions       Follow-up Information    [facility] Follow up.   Why: [facility] will be contacting you about Outpatient therapy through [facility] for charity for therapy.             Allergies  Allergen Reactions  . Sulfa Antibiotics Rash    Consultations:  optho, ID   Procedures/Studies: MR BRAIN W WO CONTRAST  Result Date: 04/19/2020 CLINICAL DATA:  Toxoplasmosis. EXAM: MRI HEAD WITHOUT AND WITH CONTRAST TECHNIQUE: Multiplanar, multiecho pulse sequences of the brain and surrounding structures were obtained without and with intravenous contrast. CONTRAST:  34mL GADAVIST GADOBUTROL 1  MMOL/ML IV SOLN COMPARISON:  04/12/2020 MRI head and prior. FINDINGS: Image quality is degraded by motion artifact. Brain: Multifocal T2 hyperintense foci involving the basal ganglia, thalami, cerebellum, brainstem and right occipitotemporal region are unchanged. Reference left basal ganglia lesion measuring 1.5 x 0.9 cm (6:15) is unchanged. Associated micronodular and thin peripheral enhancement most prominent in the left basal ganglia lesion (11:25) is less conspicuous than prior exam. No new lesions. No new focus of abnormal enhancement. Bilateral cerebral convexity pachymeningeal prominence is unchanged. No diffusion-weighted signal abnormality. Bilateral basal ganglia SWI signal dropout is unchanged. Mild diffuse parenchymal volume loss with ex vacuo dilatation, stable from prior exam and advanced for patient's age. Confluent T2/FLAIR hyperintense supratentorial white matter signal is unchanged. No midline shift, ventriculomegaly or extra-axial fluid collection. Vascular: Grossly normal flow voids. Skull and upper cervical spine: Within normal limits. Sinuses/Orbits: Normal orbits. Clear paranasal sinuses. No mastoid effusion. Other: None. IMPRESSION: 1. Motion artifact limits evaluation. 2. Multifocal T2 hyperintensities involving the basal ganglia, thalami, cerebellum, brainstem and right occipitotemporal region are unchanged in size. Associated micronodular/thin peripheral enhancement is less conspicuous than prior exam. No new lesions. 3. 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 4. Mild cerebral volume loss is unchanged and advanced for patient's age. Electronically Signed   By: Primitivo Gauze M.D.   On: 04/19/2020 10:14   MR BRAIN W WO CONTRAST  Result Date: 04/12/2020 CLINICAL DATA:  Initial evaluation for progressively worsening cognitive defects, ataxia, not improving. History of HIV/aids, CNS toxoplasmosis, on treatment. Last  CD4 count = 291 on 02/06/2020. Concern for possible IRIS. EXAM: MRI HEAD WITHOUT AND WITH CONTRAST TECHNIQUE: Multiplanar, multiecho pulse sequences of the brain and surrounding structures were obtained without and with intravenous contrast. CONTRAST:  14mL GADAVIST GADOBUTROL 1 MMOL/ML IV SOLN COMPARISON:  Comparison made with most recent brain MRI from 02/19/2020 as well as previous brain MRI from 08/10/2018. FINDINGS: Brain: Examination moderately degraded by motion artifact. Advanced cerebral atrophy for age, stable from previous. Again seen is fairly extensive confluent T2/FLAIR signal abnormality throughout the cerebral white matter without discernible enhancement or mass effect, most characteristic of HIV encephalopathy. Overall, appearance is similar to perhaps mildly improved from previous. Again seen are multiple scattered T2 hyperintense lesions involving the bilateral basal ganglia, thalami, right temporal occipital region, left dorsal midbrain and pons, and cerebellum, most characteristic of CNS toxoplasmosis infection. In comparison with previous exam, these lesions are stable to perhaps slightly decreased from prior. For example, the largest of these lesions is positioned at the left basal ganglia and measures 15 x 9 mm, previously 16 x 10 mm (series 10, image 13). Again seen are scattered foci of peripheral and nodular enhancement about several of these lesions, most pronounced at the left basal ganglia and right temporal occipital region. Overall, the degree of enhancement appears slightly decreased and improved from previous. No new significant surrounding edema. No new lesions identified. Mild diffuse pachymeningeal enhancement noted overlying both cerebral convexities, similar to previous, and could be related to prior lumbar punctures. No significant left a meningeal enhancement. No evidence for acute or subacute infarct. Gray-white matter differentiation maintained. No evidence for acute  intracranial hemorrhage. No mass effect or midline shift. No hydrocephalus or extra-axial fluid collection. Vascular: Major intracranial vascular flow voids are maintained. Skull and upper cervical spine: Craniocervical junction within normal limits. No focal marrow replacing lesion. No scalp soft tissue abnormality. Sinuses/Orbits: Globes and orbital soft tissues demonstrate no acute finding. Paranasal sinuses are largely clear. No significant mastoid effusion. Other: Non. IMPRESSION: 1. Motion degraded exam. 2. Stable to perhaps slight interval decrease in size of multiple lesions involving the bilateral cerebral hemispheres, brainstem, and cerebellum, most pronounced of which are positioned about the basal ganglia. Associated ring and nodular enhancement is stable to slightly improved. Constellation of findings suggest continued interval response to therapy. No imaging findings to suggest IRIS. 3. Underlying extensive confluent T2/FLAIR hyperintensity throughout the cerebral white matter, most characteristic of HIV encephalopathy. 4. Advanced atrophy for age. Electronically Signed   By: Jeannine Boga M.D.   On: 04/12/2020 04:29   DG FL GUIDED LUMBAR PUNCTURE  Result Date: 04/15/2020 CLINICAL DATA:  Encephalopathy. EXAM: DIAGNOSTIC LUMBAR PUNCTURE UNDER FLUOROSCOPIC GUIDANCE FLUOROSCOPY TIME:  Radiation Exposure Index (if provided by the fluoroscopic device): 29.4 mGy. PROCEDURE: Informed consent was obtained from the patient prior to the procedure, through the use of a translator, including potential complications of headache, allergy, and pain. With the patient prone, the lower back was prepped with Betadine. 1% Lidocaine was used for local anesthesia. Lumbar puncture was performed at the L4-5 level using a 20 gauge needle with return of clear CSF with an  opening pressure of 14 cm water. 19 ml of CSF were obtained for laboratory studies. The patient tolerated the procedure well and there were no  apparent complications. IMPRESSION: Under fluoroscopic guidance, lumbar puncture was performed. Electronically Signed   By: Marijo Conception M.D.   On: 04/15/2020 12:20       Subjective: No changes overnight, reports stable double vision  Discharge Exam: Vitals:   04/21/20 0409 04/21/20 0840  BP: 108/74 127/81  Pulse: 66 73  Resp: 15 17  Temp: 98.3 F (36.8 C) 98.1 F (36.7 C)  SpO2: 98% 93%   Vitals:   04/20/20 1401 04/20/20 1925 04/21/20 0409 04/21/20 0840  BP: 132/88 128/64 108/74 127/81  Pulse: 84 75 66 73  Resp: 18 15 15 17   Temp: 97.8 F (36.6 C) 98.3 F (36.8 C) 98.3 F (36.8 C) 98.1 F (36.7 C)  TempSrc: Oral Oral Oral Oral  SpO2: 94% 94% 98% 93%  Weight:      Height:        General: Pt is alert, awake, not in acute distress Cardiovascular: RRR, S1/S2 +, no rubs, no gallops Respiratory: CTA bilaterally, no wheezing, no rhonchi Abdominal: Soft, NT, ND, bowel sounds + Extremities: no edema, no cyanosis    The results of significant diagnostics from this hospitalization (including imaging, microbiology, ancillary and laboratory) are listed below for reference.     Microbiology: Recent Results (from the past 240 hour(s))  SARS Coronavirus 2 by RT PCR (hospital order, performed in [facility] hospital lab) Nasopharyngeal Nasopharyngeal Swab     Status: None   Collection Time: 04/11/20 10:11 PM   Specimen: Nasopharyngeal Swab  Result Value Ref Range Status   SARS Coronavirus 2 NEGATIVE NEGATIVE Final    Comment: (NOTE) SARS-CoV-2 target nucleic acids are NOT DETECTED.  The SARS-CoV-2 RNA is generally detectable in upper and lower respiratory specimens during the acute phase of infection. The lowest concentration of SARS-CoV-2 viral copies this assay can detect is 250 copies / mL. A negative result does not preclude SARS-CoV-2 infection and should not be used as the sole basis for treatment or other patient management decisions.  A negative result may  occur with improper specimen collection / handling, submission of specimen other than nasopharyngeal swab, presence of viral mutation(s) within the areas targeted by this assay, and inadequate number of viral copies (<250 copies / mL). A negative result must be combined with clinical observations, patient history, and epidemiological information.  Fact Sheet for Patients:   StrictlyIdeas.no  Fact Sheet for Healthcare Providers: BankingDealers.co.za  This test is not yet approved or  cleared by the [place] FDA and has been authorized for detection and/or diagnosis of SARS-CoV-2 by FDA under an Emergency Use Authorization (EUA).  This EUA will remain in effect (meaning this test can be used) for the duration of the COVID-19 declaration under Section 564(b)(1) of the Act, 21 U.S.C. section 360bbb-3(b)(1), unless the authorization is terminated or revoked sooner.  Performed at [facility], [place] [address] [place], [place] [address]   CSF culture     Status: None   Collection Time: 04/15/20 12:17 PM   Specimen: PATH Cytology CSF; Cerebrospinal Fluid  Result Value Ref Range Status   Specimen Description CSF  Final   Special Requests NONE  Final   Gram Stain NO WBC SEEN NO ORGANISMS SEEN CYTOSPIN SMEAR   Final   Culture   Final    NO GROWTH 3 DAYS Performed at [facility], [place] [address], [place], [place] [address]    Report Status 04/18/2020 FINAL  Final     Labs: BNP (last 3 results) No results for input(s): BNP in the last 8760 hours. Basic Metabolic Panel: Recent Labs  Lab 04/15/20 0737 04/18/20 0505 04/21/20 0736  NA 139 141 141  K 4.1 3.8 3.0*  CL 104 107 106  CO2 26 23 25   GLUCOSE 90 175* 85  BUN 12 20 21*  CREATININE 0.93 0.95 1.12  CALCIUM 9.1 8.6* 8.1*   Liver Function Tests: Recent Labs  Lab 04/18/20 0505 04/21/20 0736  AST 36 22  ALT 58* 44  ALKPHOS 86 66  BILITOT 0.9 1.1  PROT  6.2* 5.3*  ALBUMIN 3.3* 2.8*   No results for input(s): LIPASE, AMYLASE in the last 168 hours. No results for input(s): AMMONIA in the last 168 hours. CBC: Recent Labs  Lab 04/15/20 0737 04/18/20 0505 04/20/20 0421 04/21/20 0736  WBC 9.9 21.4* 14.3* 14.7*  NEUTROABS  --   --   --  9.2*  HGB 14.4 13.6 12.6* 13.0  HCT 44.7 41.5 39.3 40.2  MCV 91.8 93.3 91.4 91.8  PLT 310 338 322 314   Cardiac Enzymes: No results for input(s): CKTOTAL, CKMB, CKMBINDEX, TROPONINI in the last 168 hours. BNP: Invalid input(s): POCBNP CBG: Recent Labs  Lab 04/17/20 0642 04/17/20 1141 04/17/20 1641 04/20/20 2015 04/21/20 0648  GLUCAP 189* 193* 229* 128* 90   D-Dimer No results for input(s): DDIMER in the last 72 hours. Hgb A1c No results for input(s): HGBA1C in the last 72 hours. Lipid Profile No results for input(s): CHOL, HDL, LDLCALC, TRIG, CHOLHDL, LDLDIRECT in the last 72 hours. Thyroid function studies No results for input(s): TSH, T4TOTAL, T3FREE, THYROIDAB in the last 72 hours.  Invalid input(s): FREET3 Anemia work up No results for input(s): VITAMINB12, FOLATE, FERRITIN, TIBC, IRON, RETICCTPCT in the last 72 hours. Urinalysis    Component Value Date/Time   COLORURINE YELLOW 08/12/2018 1918   APPEARANCEUR CLEAR 08/12/2018 1918   LABSPEC 1.010 08/12/2018 1918   PHURINE 6.0 08/12/2018 1918   GLUCOSEU NEGATIVE 08/12/2018 1918   HGBUR NEGATIVE 08/12/2018 1918   BILIRUBINUR NEGATIVE 08/12/2018 1918   KETONESUR NEGATIVE 08/12/2018 1918   PROTEINUR NEGATIVE 08/12/2018 1918   NITRITE NEGATIVE 08/12/2018 1918   LEUKOCYTESUR NEGATIVE 08/12/2018 1918   Sepsis Labs Invalid input(s): PROCALCITONIN,  WBC,  LACTICIDVEN Microbiology Recent Results (from the past 240 hour(s))  SARS Coronavirus 2 by RT PCR (hospital order, performed in [place] hospital lab) Nasopharyngeal Nasopharyngeal Swab     Status: None   Collection Time: 04/11/20 10:11 PM   Specimen: Nasopharyngeal Swab   Result Value Ref Range Status   SARS Coronavirus 2 NEGATIVE NEGATIVE Final    Comment: (NOTE) SARS-CoV-2 target nucleic acids are NOT DETECTED.  The SARS-CoV-2 RNA is generally detectable in upper and lower respiratory specimens during the acute phase of infection. The lowest concentration of SARS-CoV-2 viral copies this assay can detect is 250 copies / mL. A negative result does not preclude SARS-CoV-2 infection and should not be used as the sole basis for treatment or other patient management decisions.  A negative result may occur with improper specimen collection / handling, submission of specimen other than nasopharyngeal swab, presence of viral mutation(s) within the areas targeted by this assay, and inadequate number of viral copies (<250 copies / mL). A negative result must be combined with clinical observations, patient history, and epidemiological information.  Fact Sheet  for Patients:   StrictlyIdeas.no  Fact Sheet for Healthcare Providers: BankingDealers.co.za  This test is not yet approved or  cleared by the [place] FDA and has been authorized for detection and/or diagnosis of SARS-CoV-2 by FDA under an Emergency Use Authorization (EUA).  This EUA will remain in effect (meaning this test can be used) for the duration of the COVID-19 declaration under Section 564(b)(1) of the Act, 21 U.S.C. section 360bbb-3(b)(1), unless the authorization is terminated or revoked sooner.  Performed at [facility], [place] [address] [place], [place] [address]   CSF culture     Status: None   Collection Time: 04/15/20 12:17 PM   Specimen: PATH Cytology CSF; Cerebrospinal Fluid  Result Value Ref Range Status   Specimen Description CSF  Final   Special Requests NONE  Final   Gram Stain NO WBC SEEN NO ORGANISMS SEEN CYTOSPIN SMEAR   Final   Culture   Final    NO GROWTH 3 DAYS Performed at [facility], [place]  [address], [place], [place] [address]    Report Status 04/18/2020 FINAL  Final     Time coordinating discharge: Over 30 minutes  SIGNED:   Nicolette Bang, MD  [facility] 04/21/2020, 10:41 AM Pager   If 7PM-7AM, please contact night-coverage [email or web address] Password [email or web address]

## 2020-04-24 ENCOUNTER — Encounter: Payer: Self-pay | Admitting: Physical Medicine & Rehabilitation

## 2020-04-24 ENCOUNTER — Encounter: Payer: Self-pay | Attending: Physical Medicine & Rehabilitation | Admitting: Physical Medicine & Rehabilitation

## 2020-04-24 ENCOUNTER — Other Ambulatory Visit: Payer: Self-pay

## 2020-04-24 VITALS — BP 129/90 | HR 120 | Temp 98.9°F | Ht 68.0 in | Wt 203.0 lb

## 2020-04-24 DIAGNOSIS — G9340 Encephalopathy, unspecified: Secondary | ICD-10-CM

## 2020-04-24 DIAGNOSIS — B582 Toxoplasma meningoencephalitis: Secondary | ICD-10-CM | POA: Insufficient documentation

## 2020-04-24 DIAGNOSIS — R269 Unspecified abnormalities of gait and mobility: Secondary | ICD-10-CM | POA: Insufficient documentation

## 2020-04-24 DIAGNOSIS — R270 Ataxia, unspecified: Secondary | ICD-10-CM

## 2020-04-24 NOTE — Progress Notes (Signed)
Subjective:    Patient ID: [place], male    DOB: 1991-08-19, 29 y.o.   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  After I last saw him he was back in the hospital again with complications.  He received some therapy in the hospital but since being home now for a couple weeks continues to struggle with his balance.  In some ways his balance is worse.  He is not using a walker as if he was unable to coordinate adequately with the walker.  Family is always better at his side for balance.  He is not having pain.  They do report a tremor in the right arm which is ongoing.  Patient's mood has been upbeat.   Pain Inventory Average Pain 0 Pain Right Now 0 My pain is no pain  In the last 24 hours, has pain interfered with the following? General activity 0 Relation with others 0 Enjoyment of life 0 What TIME of day is your pain at its worst? no pain Sleep (in general) Good  Pain is worse with: no pain Pain improves with: no pain Relief from Meds: no pain  Mobility walk without assistance walk with assistance use a walker ability to climb steps?  no do you drive?  no  Function I need assistance with the following:  bathing, meal prep, household duties and shopping  Neuro/Psych weakness trouble walking  Prior Studies Any changes since last visit?  no  Physicians involved in your care Any changes since last visit?  no   History reviewed. No pertinent family history. Social History   Socioeconomic History  . Marital status: Single    Spouse name: Not on file  . Number of children: Not on file  . Years of education: Not on file  . Highest education level: Not on file  Occupational History  . Not on file  Tobacco Use  . Smoking status: Never Smoker  . Smokeless tobacco: Never Used  Vaping Use  . Vaping Use: Never used  Substance and Sexual Activity  . Alcohol use: Yes    Alcohol/week: 6.0  standard drinks    Types: 6 Cans of beer per week    Comment: 08/10/2018 "drink only on weekend"  . Drug use: Not Currently    Comment: 08/10/2018 "have tried coke; been a long time since I've usd any"  . Sexual activity: Not Currently  Other Topics Concern  . Not on file  Social History Narrative  . Not on file   Social Determinants of Health   Financial Resource Strain:   . Difficulty of Paying Living Expenses:   Food Insecurity:   . Worried About Charity fundraiser in the Last Year:   . Arboriculturist in the Last Year:   Transportation Needs:   . Film/video editor (Medical):   [name] Kitchen Lack of Transportation (Non-Medical):   Physical Activity:   . Days of Exercise per Week:   . Minutes of Exercise per Session:   Stress:   . Feeling of Stress :   Social Connections:   . Frequency of Communication with Friends and Family:   . Frequency of Social Gatherings with Friends and Family:   . Attends Religious Services:   . Active Member of Clubs or Organizations:   . Attends Archivist Meetings:   [name] Kitchen Marital Status:    Past Surgical History:  Procedure  Laterality Date  . NO PAST SURGERIES     Past Medical History:  Diagnosis Date  . Anemia    [name] 08/10/2018  . Dysphagia 02/06/2020  . HIV (human immunodeficiency virus infection) ([place])    dx'd 07/2018  . 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 Neuropathy 09/21/2018  . 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 Right sided weakness 09/21/2018  . Tongue lesion 09/21/2018  . 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   HENT: Negative.   Eyes: Negative.   Respiratory: Negative.   Cardiovascular: Negative.   Gastrointestinal: Negative.   Endocrine: Negative.   Genitourinary: Negative.   Musculoskeletal: Positive for gait problem.  Allergic/Immunologic: Negative.   Neurological: Positive for speech difficulty and weakness.  Hematological: Negative.   Psychiatric/Behavioral: Negative.   All other systems reviewed and are negative.      Objective:   Physical Exam General: No acute distress HEENT: EOMI, oral membranes moist Cards: reg rate  Chest: normal effort Abdomen: Soft, NT, ND Skin: dry, intact Extremities: no edema Neuro: improved EOMI, no diplopia or blurred vision. Patient with improved attention and awareness.  Still has difficulties with processing simple information and following commands. Restless movements/tremors of right side. . More ataxic with gait, left more than right.  Lost balance often to left with buckling of left knee, but swayed to right alwo.  Decreased sensation to LT and Pain on left but improved. . Reflexes are 2+ in all 4's.  5 out of 5 right more than left. Musculoskeletal: Normal range of motion.  No pain with palpation in extremities or trunk. Psych: Pt's affect is appropriate. Pt is cooperative             Assessment & Plan:  1.  Decreased functional mobility with cognitive deficits secondary to acute encephalopathy related toxoplasmosis in the setting of immunosuppression due to recently diagnosed HIV.              -will see if we can get him into outpt therapies at [facility]. Paperwork and contact numbers were provided. If nothing there, will check into [facility] or with team at [facility] neuro-rehab.   -Biggest key at this point is maintaining safe activities at home as well as maintaining his infectious disease follow-up. 2.  Pain Management:  Tylenol as needed 3. Mood:  seems positive.  4. Neuropsych: This patient is not capable of  making decisions on his own behalf. 5. HIV/CNS Toxoplasmosis. plan per ID.    6.  Vision: Patient demonstrates improvement in his visual acuity as well as his binocular vision.  Still some problems with tracking to extreme positions and intermittent blurred vision 7.  Right-sided tremors/dyskinesias.  needs therapy.   -no medication will help with these movement issues or his ataxia      15 minutes was spent with the patient and family in the room today.  I will follow up with him in about  3 months time.

## 2020-04-24 NOTE — Patient Instructions (Signed)
PLEASE FEEL FREE TO CALL OUR OFFICE WITH ANY PROBLEMS OR QUESTIONS ([phone])      

## 2020-04-25 ENCOUNTER — Encounter: Payer: Self-pay | Admitting: Infectious Disease

## 2020-04-25 ENCOUNTER — Ambulatory Visit: Payer: Self-pay

## 2020-04-25 ENCOUNTER — Ambulatory Visit (INDEPENDENT_AMBULATORY_CARE_PROVIDER_SITE_OTHER): Payer: Self-pay | Admitting: Infectious Disease

## 2020-04-25 VITALS — BP 133/93 | HR 78 | Temp 99.0°F | Wt 205.0 lb

## 2020-04-25 DIAGNOSIS — D893 Immune reconstitution syndrome: Secondary | ICD-10-CM

## 2020-04-25 DIAGNOSIS — R9082 White matter disease, unspecified: Secondary | ICD-10-CM

## 2020-04-25 DIAGNOSIS — R270 Ataxia, unspecified: Secondary | ICD-10-CM

## 2020-04-25 DIAGNOSIS — B582 Toxoplasma meningoencephalitis: Secondary | ICD-10-CM

## 2020-04-25 DIAGNOSIS — B20 Human immunodeficiency virus [HIV] disease: Secondary | ICD-10-CM

## 2020-04-25 DIAGNOSIS — G9340 Encephalopathy, unspecified: Secondary | ICD-10-CM

## 2020-04-25 NOTE — Progress Notes (Signed)
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   MRN: [id number]  Thomas Park is a 29 y.o. 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  1.  White matter changes on MRI:  Differential included JC virus related PML though with successful viral suppression systemically seems unlikely  JC virus was negative for the second time  IRIS to JC? Possible though would have shown iris much earlier in his course.  He was diagnosed in 2019.  In December of that year he had a viral load of 669,000 but within a month of antegrade strand transfer inhibitor therapy his viral load was down to 199 and CD4 did come up from 10 to 90.  His virus was imperfectly suppressed and CD4 count did take a while to come up but is now been above 200 since January in fact it now nearing 500  CD8 mediated encephalopathy possible  Pathology did not show malignant cells flow cytology is pending  CMV seemed  unlikely, I had SOME CMV in blood but I doubt this is a CMV CNS infection  CSF has been taken as a 3 white cells seen.  CSF  sent to [facility] for HIV RNA since HIV viral escape is also a leading possible cause here  We gave him a trial of corticosteroids in the hospital with high-dose Solu-Medrol 500 mg every 12 hours x5 days without any improvement in his ataxia.  He was seen by [name] with ophthalmology who found no evidence of opportunistic infection in the eyes.  [name] did have some concerns that the patient might have some glaucoma in one eye and would like to see  him in the clinic.  He did intensify his HIV medication regimen by adding Pifeltro to his Biktarvy.  CSF from his lumbar puncture was sent to [facility] for AR and viral load came back at 50 copies.  This does not seem like viral escape to me and seems to me the reason we should not need to have his regimen intensified.  Because he did not improve on corticosteroids they were stopped and I stopped all of his antitoxoplasmosis drugs as he is had successful immune reconstitution for more than 6 months and has a normal CD4 count numerically.  His sister accompanies him today says that his ataxia has worsened since April though it does not seem to me to be worsening in any count of fulminant way.    Past Medical History:  Diagnosis Date  . Anemia    [name] 08/10/2018  . Dysphagia 02/06/2020  . HIV (human immunodeficiency virus infection) ([place])    dx'd 07/2018  . 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 Neuropathy 09/21/2018  . 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[place]) 09/21/2018  . Right sided weakness 09/21/2018  . Tongue lesion 09/21/2018  . 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 NO PAST SURGERIES      No family history on file.    Social History   Socioeconomic History  .  Marital status: Single    Spouse name: Not on file  . Number of children: Not on file  . Years of education: Not on file  . Highest education level: Not on file  Occupational History  . Not on file  Tobacco Use  . Smoking status: Never Smoker  . Smokeless tobacco: Never Used  Vaping Use  . Vaping Use: Never used  Substance and Sexual Activity  . Alcohol use: Yes    Alcohol/week: 6.0 standard drinks    Types: 6 Cans of beer per week    Comment: 08/10/2018 "drink only on weekend"  . Drug use: Not Currently    Comment: 08/10/2018 "have tried coke; been a long time since I've usd any"  . Sexual  activity: Not Currently  Other Topics Concern  . Not on file  Social History Narrative  . Not on file   Social Determinants of Health   Financial Resource Strain:   . Difficulty of Paying Living Expenses:   Food Insecurity:   . Worried About Charity fundraiser in the Last Year:   . Arboriculturist in the Last Year:   Transportation Needs:   . Film/video editor (Medical):   [name] Kitchen Lack of Transportation (Non-Medical):   Physical Activity:   . Days of Exercise per Week:   . Minutes of Exercise per Session:   Stress:   . Feeling of Stress :   Social Connections:   . Frequency of Communication with Friends and Family:   . Frequency of Social Gatherings with Friends and Family:   . Attends Religious Services:   . Active Member of Clubs or Organizations:   . Attends Archivist Meetings:   [name] Kitchen Marital Status:     Allergies  Allergen Reactions  . Sulfa Antibiotics Rash     Current Outpatient Medications:  .  acetaminophen (TYLENOL) 325 MG tablet, Take 2 tablets (650 mg total) by mouth every 6 (six) hours as needed for mild pain (or Fever >/= 101)., Disp: , Rfl:  .  BIKTARVY 50-200-25 MG TABS tablet, TAKE 1 TABLET BY MOUTH DAILY (Patient taking differently: Take 1 tablet by mouth daily. ), Disp: 30 tablet, Rfl: 11 .  doravirine (PIFELTRO) 100 MG TABS tablet, Take 1 tablet (100 mg total) by mouth daily., Disp: 30 tablet, Rfl: 3 .  fluconazole (DIFLUCAN) 100 MG tablet, TAKE 1 TABLET(100 MG) BY MOUTH DAILY (Patient taking differently: Take 100 mg by mouth daily. ), Disp: 14 tablet, Rfl: 1 .  gabapentin (NEURONTIN) 100 MG capsule, Take 1 capsule (100 mg total) by mouth 2 (two) times daily between meals as needed., Disp: 60 capsule, Rfl: 1 .  ondansetron (ZOFRAN) 4 MG tablet, TAKE 1 TABLET BY MOUTH EVERY 8 HOURS AS NEEDED FOR NAUSEA AND VOMITING (Patient taking differently: Take 4 mg by mouth in the morning, at noon, and at bedtime. ), Disp: 90 tablet, Rfl: 3 .  metoprolol  tartrate (LOPRESSOR) 25 MG tablet, Take 0.5 tablets (12.5 mg total) by mouth 2 (two) times daily., Disp: 30 tablet, Rfl: 0    Review of Systems  Unable to perform ROS: Dementia  Neurological: Positive for weakness.       Objective:   Physical Exam Constitutional:      General: He is not in acute distress.    Appearance: Normal appearance. He is well-developed. He is not ill-appearing or diaphoretic.  HENT:     Head: Normocephalic and atraumatic.     Right Ear: Hearing  and external ear normal.     Left Ear: Hearing and external ear normal.     Nose: No nasal deformity or rhinorrhea.  Eyes:     General: No scleral icterus.    Conjunctiva/sclera:     Right eye: Right conjunctiva is not injected.     Left eye: Left conjunctiva is not injected.  Neck:     Vascular: No JVD.  Cardiovascular:     Rate and Rhythm: Normal rate and regular rhythm.     Heart sounds: S1 normal and S2 normal.  Pulmonary:     Effort: Pulmonary effort is normal. No respiratory distress.  Abdominal:     Tenderness: There is no abdominal tenderness.  Musculoskeletal:        General: Normal range of motion.     Right shoulder: Normal.     Left shoulder: Normal.     Cervical back: Normal range of motion and neck supple.     Right hip: Normal.     Left hip: Normal.     Right knee: Normal.     Left knee: Normal.  Lymphadenopathy:     Head:     Right side of head: No submandibular, preauricular or posterior auricular adenopathy.     Left side of head: No submandibular, preauricular or posterior auricular adenopathy.     Cervical:     Right cervical: No superficial or deep cervical adenopathy.    Left cervical: No superficial or deep cervical adenopathy.  Skin:    General: Skin is warm and dry.     Coloration: Skin is not pale.     Findings: No abrasion, bruising, ecchymosis, erythema, lesion or rash.     Nails: There is no clubbing.  Neurological:     Mental Status: He is alert. He is confused.      Sensory: No sensory deficit.     Motor: Weakness present.     Coordination: Coordination abnormal.  Psychiatric:        Attention and Perception: He is attentive.        Mood and Affect: Mood normal.        Speech: Speech normal.        Behavior: Behavior normal. Behavior is cooperative.        Cognition and Memory: Cognition is impaired.      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  Really not sure what the cause of this is.  It is clearly not PML related to [place] virus with JC virus not having been detected on 2 different attempts and with his immune system being normal numerically for more than 1/2-year.  HIV escape seems unlikely and a viral load of 50 in the CNS does not seem to be to be terribly significant.  CD8 mediated cephalopathy was on it was considered well but this was not found on the cytometry.  He did not have any evidence of an opportunistic infection in the CNS or eye when exam by ophthalmology.  For now we will continue with into an intensified regimen of Pifeltro plus Biktarvy but I do not think that we are need both drugs.  I will touch base with Dr. Larey Brick at [facility], Dr. Garen Grams at [facility] and Dr. Rudi Coco at [facility]   Perhaps referral to a tertiary care center would be of benefit.  I still wonder about the idea of him having MS is not being related to HIV whatsoever.  A brain biopsy would be another thing that could be considered.  Toxoplasmosis I have stopped his antitoxoplasma drugs as he has had more than 6 months of successful immune reconstitution.  HIV disease: Continue Biktarvy with Pifeltro added for above reasons.  COVID prevention he has had his vaccines for COVID-19  I spent greater than 40 minutes with the patient including greater than 50% of time in face to face counsel of the patient and his and his sister with in person Spanish translation going over the differential diagnosis of his white matter changes the  progression and response to therapy of his HIV plan going forward and forward and in coordination of his care.

## 2020-05-01 ENCOUNTER — Encounter: Payer: Self-pay | Admitting: Infectious Disease

## 2020-05-06 ENCOUNTER — Telehealth: Payer: Self-pay | Admitting: Infectious Disease

## 2020-05-06 DIAGNOSIS — R9082 White matter disease, unspecified: Secondary | ICD-10-CM

## 2020-05-06 NOTE — Telephone Encounter (Signed)
I am referring pt to Jeannette Corpus MD at [facility] who has particular expertise in type of abnormality Thomas Park has  Joycelyn Schmid can you get him an appt with her  Can we also then employ Audelia Hives to communicate with Thomas Park?

## 2020-05-07 NOTE — Telephone Encounter (Signed)
Spoke with Audelia Hives regarding referral. Will communicate information to patient's sister.  [place]

## 2020-05-08 ENCOUNTER — Telehealth: Payer: Self-pay

## 2020-05-08 NOTE — Telephone Encounter (Signed)
Patient's sister called office today requesting medical records/ letter on behalf of her brother. States they are trying to renew passport, but wont be seen until next year. Patient's only source of ID is his passport. States that if MD writes letter with diagnosis and medical reason for an earlier appointment. Letter would ensure patient could be seen before next year. Advised patient and his sister establish PCP to help write supporting letter. Will forward message to MD. Aundria Rud, [place]

## 2020-05-10 NOTE — Telephone Encounter (Signed)
Can you remind me to compose/sign this letter on Monday when Im back in town?

## 2020-05-13 ENCOUNTER — Encounter: Payer: Self-pay | Admitting: Infectious Disease

## 2020-05-13 NOTE — Telephone Encounter (Signed)
Spoke with patient's sister regarding letter for clarification on what all is needed. Per patient's sister letter just need to state diagnosis and that he is in care.  [place]

## 2020-05-13 NOTE — Telephone Encounter (Signed)
You have note now correct?

## 2020-05-28 NOTE — Telephone Encounter (Signed)
Spoke with patient's sister regarding letter. States she has picked up letter from office. Will call if anything else is needed.

## 2020-06-04 ENCOUNTER — Other Ambulatory Visit: Payer: Self-pay | Admitting: Internal Medicine

## 2020-06-04 DIAGNOSIS — B20 Human immunodeficiency virus [HIV] disease: Secondary | ICD-10-CM

## 2020-06-26 ENCOUNTER — Other Ambulatory Visit: Payer: Self-pay | Admitting: Internal Medicine

## 2020-06-26 DIAGNOSIS — B20 Human immunodeficiency virus [HIV] disease: Secondary | ICD-10-CM

## 2020-07-03 ENCOUNTER — Encounter: Payer: Self-pay | Admitting: Physical Medicine & Rehabilitation

## 2020-07-09 ENCOUNTER — Ambulatory Visit (INDEPENDENT_AMBULATORY_CARE_PROVIDER_SITE_OTHER): Payer: Self-pay

## 2020-07-09 ENCOUNTER — Encounter: Payer: Self-pay | Admitting: Infectious Disease

## 2020-07-09 ENCOUNTER — Ambulatory Visit (INDEPENDENT_AMBULATORY_CARE_PROVIDER_SITE_OTHER): Payer: Self-pay | Admitting: Infectious Disease

## 2020-07-09 ENCOUNTER — Other Ambulatory Visit: Payer: Self-pay

## 2020-07-09 VITALS — Temp 98.0°F | Ht 72.0 in | Wt 215.0 lb

## 2020-07-09 DIAGNOSIS — R531 Weakness: Secondary | ICD-10-CM

## 2020-07-09 DIAGNOSIS — K219 Gastro-esophageal reflux disease without esophagitis: Secondary | ICD-10-CM | POA: Insufficient documentation

## 2020-07-09 DIAGNOSIS — Z23 Encounter for immunization: Secondary | ICD-10-CM

## 2020-07-09 DIAGNOSIS — R9082 White matter disease, unspecified: Secondary | ICD-10-CM

## 2020-07-09 DIAGNOSIS — B582 Toxoplasma meningoencephalitis: Secondary | ICD-10-CM

## 2020-07-09 DIAGNOSIS — R270 Ataxia, unspecified: Secondary | ICD-10-CM

## 2020-07-09 DIAGNOSIS — R4189 Other symptoms and signs involving cognitive functions and awareness: Secondary | ICD-10-CM

## 2020-07-09 DIAGNOSIS — B20 Human immunodeficiency virus [HIV] disease: Secondary | ICD-10-CM

## 2020-07-09 DIAGNOSIS — G939 Disorder of brain, unspecified: Secondary | ICD-10-CM

## 2020-07-09 DIAGNOSIS — R269 Unspecified abnormalities of gait and mobility: Secondary | ICD-10-CM

## 2020-07-09 HISTORY — DX: Gastro-esophageal reflux disease without esophagitis: K21.9

## 2020-07-09 MED ORDER — OMEPRAZOLE 20 MG PO CPDR: 20.0000 mg | DELAYED_RELEASE_CAPSULE | Freq: Every day | ORAL | 11 refills | Status: DC

## 2020-07-09 NOTE — Progress Notes (Signed)
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   MRN: [id number]  [name] is a 29 y.o. 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  1.  White matter changes on MRI:  Differential included JC virus related PML though with successful viral suppression systemically seems unlikely  JC virus was negative for the second time  IRIS to JC? Possible though would have shown iris much earlier in his course.  He was diagnosed in 2019.  In December of that year he had a viral load of 669,000 but within a month of antegrade strand transfer inhibitor therapy his viral load was down to 199 and CD4 did come up from 10 to 90.  His virus was imperfectly suppressed and CD4 count did take a while to come up but is now been above 200 since January in fact it now nearing 500  CD8 mediated encephalopathy possible  Pathology did not show malignant cells flow cytology is pending  CMV seemed  unlikely, I had SOME CMV in blood but I doubt this is a CMV CNS infection  CSF has been taken as a 3 white cells seen.  CSF  sent to [facility] for HIV RNA since HIV viral escape is also a leading possible cause here  We gave him a trial of corticosteroids in the hospital with high-dose Solu-Medrol 500 mg every 12 hours x5 days without any improvement in his ataxia.  He was seen by [name] with ophthalmology who found no evidence of opportunistic infection in the eyes.  [name] did have some concerns that the patient  might have some glaucoma in one eye and would like to see him in the clinic.  He did intensify his HIV medication regimen by adding Pifeltro to his Biktarvy.  CSF from his lumbar puncture was sent to [facility] for AR and viral load came back at 50 copies.  This does not seem like viral escape to me and seems to me the reason we should not need to have his regimen intensified.  Because he did not improve on corticosteroids they were stopped and I stopped all of his antitoxoplasmosis drugs as he is had successful immune reconstitution for more than 6 months and has a normal CD4 count numerically.  He is scheduled to see Neurologist at [facility] with particular expertise in Neuro HIV in next few weeks  His sister and he agree that his ataxia, BV are stable no better or worse and overall health better. HE does bring up some yellow material that he coughs up at times. Does not respond to antihistamines as his other allergic ssx do. I wonder if may be GERD.    Past Medical History:  Diagnosis Date  . Anemia    Thomas Park 08/10/2018  . Dysphagia 02/06/2020  . HIV (human immunodeficiency virus infection) ([place])    dx'd 07/2018  . 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 Neuropathy 09/21/2018  . 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[place]) 09/21/2018  . Right  sided weakness 09/21/2018  . Tongue lesion 09/21/2018  . 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 NO PAST SURGERIES      No family history on file.    Social History   Socioeconomic History  . Marital status: Single    Spouse name: Not on file  . Number of children: Not on file  . Years of education: Not on file  . Highest education level: Not on file  Occupational History  . Not on file  Tobacco Use  . Smoking status: Never Smoker  . Smokeless tobacco: Never Used  Vaping Use  . Vaping Use: Never used  Substance and Sexual Activity  .  Alcohol use: Yes    Alcohol/week: 6.0 standard drinks    Types: 6 Cans of beer per week    Comment: 08/10/2018 "drink only on weekend"  . Drug use: Not Currently    Comment: 08/10/2018 "have tried coke; been a long time since I've usd any"  . Sexual activity: Not Currently  Other Topics Concern  . Not on file  Social History Narrative  . Not on file   Social Determinants of Health   Financial Resource Strain:   . Difficulty of Paying Living Expenses: Not on file  Food Insecurity:   . Worried About Charity fundraiser in the Last Year: Not on file  . Ran Out of Food in the Last Year: Not on file  Transportation Needs:   . Lack of Transportation (Medical): Not on file  . Lack of Transportation (Non-Medical): Not on file  Physical Activity:   . Days of Exercise per Week: Not on file  . Minutes of Exercise per Session: Not on file  Stress:   . Feeling of Stress : Not on file  Social Connections:   . Frequency of Communication with Friends and Family: Not on file  . Frequency of Social Gatherings with Friends and Family: Not on file  . Attends Religious Services: Not on file  . Active Member of Clubs or Organizations: Not on file  . Attends Archivist Meetings: Not on file  . Marital Status: Not on file    Allergies  Allergen Reactions  . Sulfa Antibiotics Rash     Current Outpatient Medications:  .  BIKTARVY 50-200-25 MG TABS tablet, TAKE 1 TABLET BY MOUTH DAILY, Disp: 30 tablet, Rfl: 0 .  doravirine (PIFELTRO) 100 MG TABS tablet, Take 1 tablet (100 mg total) by mouth daily., Disp: 30 tablet, Rfl: 3 .  fluconazole (DIFLUCAN) 100 MG tablet, TAKE 1 TABLET(100 MG) BY MOUTH DAILY (Patient taking differently: Take 100 mg by mouth daily. ), Disp: 14 tablet, Rfl: 1 .  gabapentin (NEURONTIN) 100 MG capsule, Take 1 capsule (100 mg total) by mouth 2 (two) times daily between meals as needed., Disp: 60 capsule, Rfl: 1 .  ondansetron (ZOFRAN) 4 MG tablet, TAKE 1 TABLET BY  MOUTH EVERY 8 HOURS AS NEEDED FOR NAUSEA AND VOMITING (Patient taking differently: Take 4 mg by mouth in the morning, at noon, and at bedtime. ), Disp: 90 tablet, Rfl: 3 .  acetaminophen (TYLENOL) 325 MG tablet, Take 2 tablets (650 mg total) by mouth every 6 (six) hours as needed for mild pain (or Fever >/= 101). (Patient not taking: Reported on 07/09/2020), Disp: , Rfl:  .  metoprolol tartrate (LOPRESSOR) 25 MG tablet, Take 0.5 tablets (12.5 mg total) by mouth 2 (two) times daily., Disp: 30 tablet, Rfl:  0    Review of Systems  Unable to perform ROS: Dementia  Neurological: Positive for weakness.       Objective:   Physical Exam Constitutional:      General: He is not in acute distress.    Appearance: Normal appearance. He is well-developed. He is not ill-appearing or diaphoretic.  HENT:     Head: Normocephalic and atraumatic.     Right Ear: Hearing and external ear normal.     Left Ear: Hearing and external ear normal.     Nose: No nasal deformity or rhinorrhea.     Mouth/Throat:     Lips: Pink.     Mouth: Mucous membranes are moist.     Pharynx: Oropharynx is clear.  Eyes:     General: No scleral icterus.    Conjunctiva/sclera:     Right eye: Right conjunctiva is not injected.     Left eye: Left conjunctiva is not injected.  Neck:     Vascular: No JVD.  Cardiovascular:     Rate and Rhythm: Normal rate and regular rhythm.     Heart sounds: S1 normal and S2 normal.  Pulmonary:     Effort: Pulmonary effort is normal. No respiratory distress.  Abdominal:     Tenderness: There is no abdominal tenderness.  Musculoskeletal:        General: Normal range of motion.     Right shoulder: Normal.     Left shoulder: Normal.     Cervical back: Normal range of motion and neck supple.     Right hip: Normal.     Left hip: Normal.     Right knee: Normal.     Left knee: Normal.  Lymphadenopathy:     Head:     Right side of head: No submandibular, preauricular or posterior auricular  adenopathy.     Left side of head: No submandibular, preauricular or posterior auricular adenopathy.     Cervical:     Right cervical: No superficial or deep cervical adenopathy.    Left cervical: No superficial or deep cervical adenopathy.  Skin:    General: Skin is warm and dry.     Coloration: Skin is not pale.     Findings: No abrasion, bruising, ecchymosis, erythema, lesion or rash.     Nails: There is no clubbing.  Neurological:     Mental Status: He is alert. He is confused.     Sensory: No sensory deficit.     Motor: Weakness present.     Coordination: Coordination abnormal.  Psychiatric:        Attention and Perception: He is attentive.        Mood and Affect: Mood normal.        Speech: Speech normal.        Behavior: Behavior normal. Behavior is cooperative.        Cognition and Memory: Cognition is impaired.      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  Really not sure what the cause of this is.  It is clearly not PML related to [place] virus with JC virus not having been detected on 2 different attempts and with his immune system being normal numerically for more than 1/2-year.  HIV escape seems unlikely and a viral load of 50 in the CNS does not seem to be to be terribly significant.  CD8 mediated cephalopathy was on it was considered well but this was not found  on the cytometry.  He did not have any evidence of an opportunistic infection in the CNS or eye when exam by ophthalmology.  For now we will continue with into an intensified regimen of Pifeltro plus Biktarvy but I do not think that we are need both drugs.  I wonder very much if he had an IRIS flare past Spring that is now over and not change-able  Toxoplasmosis I have stopped his antitoxoplasma drugs as he has had more than 6 months of successful immune reconstitution.  HIV disease: Continue Biktarvy with Pifeltro added for above reasons.  COVID prevention he has had his  vaccines for COVID-19 but will get booster today  GERD: sounds possible and will try PPI

## 2020-07-09 NOTE — Progress Notes (Signed)
   Covid-19 Vaccination Clinic  Name:  Thomas Park    MRN: [id number] DOB: [birth year]  07/09/2020  Mr. Thomas Park was observed post Covid-19 immunization for 15 minutes without incident. He was provided with Vaccine Information Sheet and instruction to access the V-Safe system.   Mr. Thomas Park was instructed to call 911 with any severe reactions post vaccine: [name] Kitchen Difficulty breathing  . Swelling of face and throat  . A fast heartbeat  . A bad rash all over body  . Dizziness and weakness

## 2020-07-10 LAB — T-HELPER CELL (CD4) - (RCID CLINIC ONLY)
CD4 % Helper T Cell: 14 % — ABNORMAL LOW (ref 33–65)
CD4 T Cell Abs: 285 /uL — ABNORMAL LOW (ref 400–1790)

## 2020-07-12 LAB — COMPLETE METABOLIC PANEL WITH GFR
AG Ratio: 1.4 (calc) (ref 1.0–2.5)
ALT: 33 U/L (ref 9–46)
AST: 28 U/L (ref 10–40)
Albumin: 4.5 g/dL (ref 3.6–5.1)
Alkaline phosphatase (APISO): 129 U/L (ref 36–130)
BUN: 17 mg/dL (ref 7–25)
CO2: 24 mmol/L (ref 20–32)
Calcium: 10 mg/dL (ref 8.6–10.3)
Chloride: 105 mmol/L (ref 98–110)
Creat: 0.9 mg/dL (ref 0.60–1.35)
GFR, Est African American: 132 mL/min/{1.73_m2} (ref >=60)
GFR, Est Non African American: 114 mL/min/{1.73_m2} (ref >=60)
Globulin: 3.2 g/dL (calc) (ref 1.9–3.7)
Glucose, Bld: 92 mg/dL (ref 65–99)
Potassium: 4.3 mmol/L (ref 3.5–5.3)
Sodium: 139 mmol/L (ref 135–146)
Total Bilirubin: 0.5 mg/dL (ref 0.2–1.2)
Total Protein: 7.7 g/dL (ref 6.1–8.1)

## 2020-07-12 LAB — CBC WITH DIFFERENTIAL/PLATELET
Absolute Monocytes: 758 cells/uL (ref 200–950)
Basophils Absolute: 71 cells/uL (ref 0–200)
Basophils Relative: 0.9 %
Eosinophils Absolute: 458 cells/uL (ref 15–500)
Eosinophils Relative: 5.8 %
HCT: 49.8 % (ref 38.5–50.0)
Hemoglobin: 16.7 g/dL (ref 13.2–17.1)
Lymphs Abs: 2188 cells/uL (ref 850–3900)
MCH: 28.4 pg (ref 27.0–33.0)
MCHC: 33.5 g/dL (ref 32.0–36.0)
MCV: 84.8 fL (ref 80.0–100.0)
MPV: 10.5 fL (ref 7.5–12.5)
Monocytes Relative: 9.6 %
Neutro Abs: 4424 cells/uL (ref 1500–7800)
Neutrophils Relative %: 56 %
Platelets: 363 10*3/uL (ref 140–400)
RBC: 5.87 10*6/uL — ABNORMAL HIGH (ref 4.20–5.80)
RDW: 12.4 % (ref 11.0–15.0)
Total Lymphocyte: 27.7 %
WBC: 7.9 10*3/uL (ref 3.8–10.8)

## 2020-07-12 LAB — RPR: RPR Ser Ql: NONREACTIVE

## 2020-07-12 LAB — HIV-1 RNA QUANT-NO REFLEX-BLD
HIV 1 RNA Quant: <20 Copies/mL — ABNORMAL HIGH
HIV-1 RNA Quant, Log: <1.3 Log cps/mL — ABNORMAL HIGH

## 2020-07-23 ENCOUNTER — Other Ambulatory Visit: Payer: Self-pay

## 2020-07-25 ENCOUNTER — Other Ambulatory Visit: Payer: Self-pay | Admitting: Infectious Disease

## 2020-07-25 DIAGNOSIS — B20 Human immunodeficiency virus [HIV] disease: Secondary | ICD-10-CM

## 2020-07-25 MED ORDER — DORAVIRINE 100 MG PO TABS
100.0000 mg | ORAL_TABLET | Freq: Every day | ORAL | 3 refills | Status: DC
Start: 2020-07-25 — End: 2020-10-15

## 2020-09-17 ENCOUNTER — Encounter: Payer: Self-pay | Admitting: Infectious Disease

## 2020-09-17 ENCOUNTER — Ambulatory Visit (INDEPENDENT_AMBULATORY_CARE_PROVIDER_SITE_OTHER): Payer: Self-pay | Admitting: Infectious Disease

## 2020-09-17 ENCOUNTER — Other Ambulatory Visit: Payer: Self-pay

## 2020-09-17 VITALS — BP 130/90 | HR 98 | Wt 225.0 lb

## 2020-09-17 DIAGNOSIS — R270 Ataxia, unspecified: Secondary | ICD-10-CM

## 2020-09-17 DIAGNOSIS — B20 Human immunodeficiency virus [HIV] disease: Secondary | ICD-10-CM

## 2020-09-17 DIAGNOSIS — H538 Other visual disturbances: Secondary | ICD-10-CM

## 2020-09-17 DIAGNOSIS — B582 Toxoplasma meningoencephalitis: Secondary | ICD-10-CM

## 2020-09-17 DIAGNOSIS — R9082 White matter disease, unspecified: Secondary | ICD-10-CM

## 2020-09-17 DIAGNOSIS — G939 Disorder of brain, unspecified: Secondary | ICD-10-CM

## 2020-09-17 DIAGNOSIS — R269 Unspecified abnormalities of gait and mobility: Secondary | ICD-10-CM

## 2020-09-17 DIAGNOSIS — D893 Immune reconstitution syndrome: Secondary | ICD-10-CM

## 2020-09-17 NOTE — Progress Notes (Signed)
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   MRN: [id number]  Thomas Park is a 30 y.o. 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  1.  White matter changes on MRI:  Differential included JC virus related PML though with successful viral suppression systemically seems unlikely  JC virus was negative for the second time  IRIS to JC? Possible though would have shown iris much earlier in his course.  He was diagnosed in 2019.  In December of that year he had a viral load of 669,000 but within a month of antegrade strand transfer inhibitor therapy his viral load was down to 199 and CD4 did come up from 10 to 90.  His virus was imperfectly suppressed and CD4 count did take a while to come up but is now been above 200 since January in fact it now nearing 500  CD8 mediated encephalopathy possible  Pathology did not show malignant cells flow cytology is pending  CMV seemed  unlikely, I had SOME CMV in blood but I doubt this is a CMV CNS infection  CSF has been taken as a 3 white cells seen.  CSF  sent to [facility] for HIV RNA since HIV viral escape was  also a leading possible cause here  We gave him a trial of corticosteroids in the hospital with high-dose Solu-Medrol 500 mg every 12 hours x5 days without any improvement in his ataxia.  He was seen by [name] with ophthalmology who found no evidence of opportunistic infection in the eyes.  [name] did have some concerns that the patient might have some glaucoma in one eye and would like  to see him in the clinic.  He did intensify his HIV medication regimen by adding Pifeltro to his Biktarvy.  CSF from his lumbar puncture was sent to [facility] for AR and viral load came back at 50 copies.  This does not seem like viral escape to me and seems to me the reason we should not need to have his regimen intensified.  Because he did not improve on corticosteroids they were stopped and I stopped all of his antitoxoplasmosis drugs as he is had successful immune reconstitution for more than 6 months and has a normal CD4 count numerically.  He has since seen [name], MD a Neurologist at [facility] with particular expertise in Neuro HIV in   Fortunately I cannot read the notes by [name] in "care everywhere.  I have sent her an email to correspond with her about her findings.  Patient was referred to neuro-ophthalmology and was seen by Dr [name] who has recommended patient wearing patch or glasses to vision in one eye as this is the only maneuver that seems to correct the patient's problems with persistent diplopia.  He has not been seen by [facility] since August but was supposed to follow-up with Dr. [name].  I will get clarity around what [name] is thinking regards this patient's pathology and also likely reconnect the patient to [facility] here.  It does not sound like his ataxia is improved  much.  I do not know if some of it is complicated by his diplopia as well and if patch will help with this.  One of the patient's sisters accompanied him today's feels like the patient's gait has gotten worse with him gaining weight.      Past Medical History:  Diagnosis Date  . Anemia    [name] 08/10/2018  . Dysphagia 02/06/2020  . GERD (gastroesophageal reflux disease) 07/09/2020  . HIV (human immunodeficiency virus infection) ([place])    dx'd 07/2018  . 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 Neuropathy 09/21/2018  . Purple toe syndrome of right  foot ([place]) 09/21/2018  . Right sided weakness 09/21/2018  . Tongue lesion 09/21/2018  . 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 NO PAST SURGERIES      No family history on file.    Social History   Socioeconomic History  . Marital status: Single    Spouse name: Not on file  . Number of children: Not on file  . Years of education: Not on file  . Highest education level: Not on file  Occupational History  . Not on file  Tobacco Use  . Smoking status: Never Smoker  . Smokeless tobacco: Never Used  Vaping Use  . Vaping Use: Never used  Substance and Sexual Activity  . Alcohol use: Yes    Alcohol/week: 6.0 standard drinks    Types: 6 Cans of beer per week    Comment: 08/10/2018 "drink only on weekend"  . Drug use: Not Currently    Comment: 08/10/2018 "have tried coke; been a long time since I've usd any"  . Sexual activity: Not Currently  Other Topics Concern  . Not on file  Social History Narrative  . Not on file   Social Determinants of Health   Financial Resource Strain: Not on file  Food Insecurity: Not on file  Transportation Needs: Not on file  Physical Activity: Not on file  Stress: Not on file  Social Connections: Not on file    Allergies  Allergen Reactions  . Sulfa Antibiotics Rash     Current Outpatient Medications:  .  BIKTARVY 50-200-25 MG TABS tablet, TAKE 1 TABLET BY MOUTH DAILY, Disp: 30 tablet, Rfl: 3 .  doravirine (PIFELTRO) 100 MG TABS tablet, Take 1 tablet (100 mg total) by mouth daily., Disp: 30 tablet, Rfl: 3 .  omeprazole (PRILOSEC) 20 MG capsule, Take 1 capsule (20 mg total) by mouth daily., Disp: 30 capsule, Rfl: 11 .  acetaminophen (TYLENOL) 325 MG tablet, Take 2 tablets (650 mg total) by mouth every 6 (six) hours as needed for mild pain (or Fever >/= 101). (Patient not taking: No sig reported), Disp: , Rfl:  .  fluconazole (DIFLUCAN) 100 MG tablet, TAKE 1 TABLET(100 MG) BY MOUTH  DAILY (Patient not taking: Reported on 09/17/2020), Disp: 14 tablet, Rfl: 1 .  gabapentin (NEURONTIN) 100 MG capsule, Take 1 capsule (100 mg total) by mouth 2 (two) times daily between meals as needed. (Patient not taking: Reported on 09/17/2020), Disp: 60 capsule, Rfl: 1 .  metoprolol tartrate (LOPRESSOR) 25 MG tablet, Take 0.5 tablets (12.5 mg total) by mouth 2 (two) times daily., Disp: 30 tablet, Rfl: 0 .  ondansetron (ZOFRAN) 4 MG tablet, TAKE 1 TABLET BY MOUTH EVERY 8 HOURS AS NEEDED FOR NAUSEA AND VOMITING (Patient not taking: Reported on 09/17/2020), Disp: 90 tablet, Rfl: 3  Review of Systems  Unable to perform ROS: Dementia  Eyes: Positive for visual disturbance.  Neurological: Positive for weakness.       Objective:   Physical Exam Constitutional:      General: He is not in acute distress.    Appearance: Normal appearance. He is well-developed. He is not ill-appearing or diaphoretic.  HENT:     Head: Normocephalic and atraumatic.     Right Ear: Hearing and external ear normal.     Left Ear: Hearing and external ear normal.     Nose: No nasal deformity or rhinorrhea.     Mouth/Throat:     Lips: Pink.     Mouth: Mucous membranes are moist.     Pharynx: Oropharynx is clear.  Eyes:     General: No scleral icterus.    Conjunctiva/sclera:     Right eye: Right conjunctiva is not injected.     Left eye: Left conjunctiva is not injected.     Comments: nystagmus  Neck:     Vascular: No JVD.  Cardiovascular:     Rate and Rhythm: Normal rate and regular rhythm.     Heart sounds: S1 normal and S2 normal.  Pulmonary:     Effort: Pulmonary effort is normal. No respiratory distress.  Abdominal:     Tenderness: There is no abdominal tenderness.  Musculoskeletal:        General: Normal range of motion.     Right shoulder: Normal.     Left shoulder: Normal.     Cervical back: Normal range of motion and neck supple.     Right hip: Normal.     Left hip: Normal.     Right knee:  Normal.     Left knee: Normal.  Lymphadenopathy:     Head:     Right side of head: No submandibular, preauricular or posterior auricular adenopathy.     Left side of head: No submandibular, preauricular or posterior auricular adenopathy.     Cervical:     Right cervical: No superficial or deep cervical adenopathy.    Left cervical: No superficial or deep cervical adenopathy.  Skin:    General: Skin is warm and dry.     Coloration: Skin is not pale.     Findings: No abrasion, bruising, ecchymosis, erythema, lesion or rash.     Nails: There is no clubbing.  Neurological:     Mental Status: He is alert. He is confused.     Sensory: No sensory deficit.     Motor: Weakness present.     Coordination: Coordination abnormal.  Psychiatric:        Attention and Perception: He is attentive.        Mood and Affect: Mood normal.        Speech: Speech normal.        Behavior: Behavior normal. Behavior is cooperative.        Cognition and Memory: Cognition is impaired.      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  Really not sure what the cause of this is.  It is clearly not PML related to [place] virus with JC virus not having been detected on 2 different attempts and with his immune system being normal numerically for more than 1/2-year.  HIV escape seems unlikely and a viral load of 50 in the CNS does not seem to be to be terribly significant.  CD8 mediated cephalopathy was on it  was considered well but this was not found on the cytometry.  He did not have any evidence of an opportunistic infection in the CNS or eye when exam by ophthalmology.  For now  I have continued with into an intensified regimen of Pifeltro plus Biktarvy but I do not think that we are need both drugs for a CSF VL of only 50 but will confer with Dr. Trisha Mangle first.  I wonder very much if he had an IRIS flare past Spring that is now over and not change-able  Toxoplasmosis I have stopped  his antitoxoplasma drugs as he has had more than 6 months of successful immune reconstitution.  HIV disease: Continue Biktarvy with Pifeltro added for above reasons.  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

## 2020-09-18 LAB — URINE CYTOLOGY ANCILLARY ONLY
Chlamydia: NEGATIVE
Comment: NEGATIVE
Comment: NORMAL
Neisseria Gonorrhea: NEGATIVE

## 2020-09-18 LAB — T-HELPER CELL (CD4) - (RCID CLINIC ONLY)
CD4 % Helper T Cell: 15 % — ABNORMAL LOW (ref 33–65)
CD4 T Cell Abs: 314 /uL — ABNORMAL LOW (ref 400–1790)

## 2020-09-25 LAB — COMPLETE METABOLIC PANEL WITH GFR
AG Ratio: 1.4 (calc) (ref 1.0–2.5)
ALT: 29 U/L (ref 9–46)
AST: 24 U/L (ref 10–40)
Albumin: 4.4 g/dL (ref 3.6–5.1)
Alkaline phosphatase (APISO): 137 U/L — ABNORMAL HIGH (ref 36–130)
BUN: 17 mg/dL (ref 7–25)
CO2: 27 mmol/L (ref 20–32)
Calcium: 9.5 mg/dL (ref 8.6–10.3)
Chloride: 104 mmol/L (ref 98–110)
Creat: 0.92 mg/dL (ref 0.60–1.35)
GFR, Est African American: 129 mL/min/{1.73_m2} (ref >=60)
GFR, Est Non African American: 111 mL/min/{1.73_m2} (ref >=60)
Globulin: 3.2 g/dL (calc) (ref 1.9–3.7)
Glucose, Bld: 108 mg/dL — ABNORMAL HIGH (ref 65–99)
Potassium: 4.1 mmol/L (ref 3.5–5.3)
Sodium: 139 mmol/L (ref 135–146)
Total Bilirubin: 0.5 mg/dL (ref 0.2–1.2)
Total Protein: 7.6 g/dL (ref 6.1–8.1)

## 2020-09-25 LAB — CBC WITH DIFFERENTIAL/PLATELET
Absolute Monocytes: 643 cells/uL (ref 200–950)
Basophils Absolute: 63 cells/uL (ref 0–200)
Basophils Relative: 1 %
Eosinophils Absolute: 284 cells/uL (ref 15–500)
Eosinophils Relative: 4.5 %
HCT: 50.5 % — ABNORMAL HIGH (ref 38.5–50.0)
Hemoglobin: 17.3 g/dL — ABNORMAL HIGH (ref 13.2–17.1)
Lymphs Abs: 2161 cells/uL (ref 850–3900)
MCH: 28 pg (ref 27.0–33.0)
MCHC: 34.3 g/dL (ref 32.0–36.0)
MCV: 81.8 fL (ref 80.0–100.0)
MPV: 9.8 fL (ref 7.5–12.5)
Monocytes Relative: 10.2 %
Neutro Abs: 3150 cells/uL (ref 1500–7800)
Neutrophils Relative %: 50 %
Platelets: 374 10*3/uL (ref 140–400)
RBC: 6.17 10*6/uL — ABNORMAL HIGH (ref 4.20–5.80)
RDW: 14.6 % (ref 11.0–15.0)
Total Lymphocyte: 34.3 %
WBC: 6.3 10*3/uL (ref 3.8–10.8)

## 2020-09-25 LAB — RPR: RPR Ser Ql: NONREACTIVE

## 2020-09-25 LAB — HIV-1 RNA QUANT-NO REFLEX-BLD
HIV 1 RNA Quant: <20 Copies/mL
HIV-1 RNA Quant, Log: <1.3 Log cps/mL

## 2020-09-26 ENCOUNTER — Telehealth: Payer: Self-pay | Admitting: Infectious Disease

## 2020-09-26 MED ORDER — PREDNISONE 20 MG PO TABS: ORAL_TABLET | ORAL | 0 refills | Status: AC

## 2020-09-26 NOTE — Telephone Encounter (Signed)
I discussed Thomas Park's case with [facility] yesterday  Suggestion is to give another trial of steroids  Solumedrol 1 g IV daily x 3 days  In Short stay followed by prednisone taper of  80mg  daily x 2 weeks 60 mg "" 40"" 20"   He should also see me or partner in the interim, roughly 2 weeks  He can also stop the Pifeltro but continue Biktarvy  Would coordinate with Audelia Hives as well and discussed with Sharyn Lull and Cici just now

## 2020-09-26 NOTE — Telephone Encounter (Signed)
CMA attempted to schedule appointment for patient to get infusion at Short Stay. Spoke with Laverne who is not able to schedule appointment at this time due to no insurance coverage. Laverne will need to speak with [place], Surveyor, quantity for assistance. Per Laverne patient will need to work with a case Freight forwarder for coverage. Will have patient apply for cone assistance.  Received return call from Assistance director who state she will have Benjamine Mola, Pharmacist look into patient's case for assistance. Will call office with updates on assistance.

## 2020-09-27 NOTE — Telephone Encounter (Signed)
CMA called Short stay to schedule infusion. Patient will be self- pay and receive a 57% discount. Is scheduled for first dose on Tuesday 1/18 at 12. Will have Audelia Hives reach out to patient with appointment information.  [place]

## 2020-09-27 NOTE — Telephone Encounter (Signed)
Thanks so much Cece!

## 2020-09-30 ENCOUNTER — Other Ambulatory Visit (HOSPITAL_COMMUNITY): Payer: Self-pay

## 2020-10-01 ENCOUNTER — Encounter (HOSPITAL_COMMUNITY)
Admission: RE | Admit: 2020-10-01 | Discharge: 2020-10-01 | Disposition: A | Discharge status: Home / Self Care | Payer: Self-pay | Source: Ambulatory Visit | Attending: Infectious Disease | Admitting: Infectious Disease

## 2020-10-01 ENCOUNTER — Other Ambulatory Visit: Payer: Self-pay

## 2020-10-01 ENCOUNTER — Telehealth: Payer: Self-pay | Admitting: *Deleted

## 2020-10-01 DIAGNOSIS — B20 Human immunodeficiency virus [HIV] disease: Secondary | ICD-10-CM | POA: Insufficient documentation

## 2020-10-01 MED ORDER — SODIUM CHLORIDE 0.9 % IV SOLN
1000.0000 mg | Freq: Every day | INTRAVENOUS | Status: DC
  Administered 2020-10-01: 1000 mg via INTRAVENOUS
  Filled 2020-10-01: qty 8

## 2020-10-01 NOTE — Telephone Encounter (Signed)
Yes sorry after the infusions have been completed the day after number 3. Can we put him in clinic with me in next few weeks or w another provider. I actually do not want to book up my Friday the 28th too much since that is my Mom's 79th bday

## 2020-10-01 NOTE — Telephone Encounter (Signed)
Patient's sister confused about infusion appointments, thought today's was rescheduled to 1/20; called Audelia Hives for help. RN clarified that he has 3 infusion appointments 1/18, 1/19, and 1/20.  He is supposed to start the oral prednisone after his infusions are complete. Audelia Hives will relay to patient. Landis Gandy, RN

## 2020-10-01 NOTE — Telephone Encounter (Signed)
Perfectt

## 2020-10-01 NOTE — Telephone Encounter (Signed)
Scheduled patient with you 10/15/20. Relayed to patient's sister with the assistance of Audelia Hives.  THanks!

## 2020-10-02 ENCOUNTER — Encounter (HOSPITAL_COMMUNITY)
Admission: RE | Admit: 2020-10-02 | Discharge: 2020-10-02 | Disposition: A | Discharge status: Home / Self Care | Payer: Self-pay | Source: Ambulatory Visit | Attending: Infectious Disease | Admitting: Infectious Disease

## 2020-10-02 ENCOUNTER — Other Ambulatory Visit: Payer: Self-pay

## 2020-10-02 MED ORDER — SODIUM CHLORIDE 0.9 % IV SOLN
1000.0000 mg | Freq: Every day | INTRAVENOUS | Status: DC
  Administered 2020-10-02: 1000 mg via INTRAVENOUS
  Filled 2020-10-02: qty 8

## 2020-10-03 ENCOUNTER — Encounter (HOSPITAL_COMMUNITY)
Admission: RE | Admit: 2020-10-03 | Discharge: 2020-10-03 | Disposition: A | Discharge status: Home / Self Care | Payer: Self-pay | Source: Ambulatory Visit | Attending: Infectious Disease | Admitting: Infectious Disease

## 2020-10-03 MED ORDER — SODIUM CHLORIDE 0.9 % IV SOLN
1000.0000 mg | Freq: Every day | INTRAVENOUS | Status: DC
  Administered 2020-10-03: 1000 mg via INTRAVENOUS
  Filled 2020-10-03: qty 8

## 2020-10-15 ENCOUNTER — Ambulatory Visit (INDEPENDENT_AMBULATORY_CARE_PROVIDER_SITE_OTHER): Payer: Self-pay | Admitting: Infectious Disease

## 2020-10-15 ENCOUNTER — Encounter: Payer: Self-pay | Admitting: Infectious Disease

## 2020-10-15 ENCOUNTER — Other Ambulatory Visit: Payer: Self-pay

## 2020-10-15 VITALS — BP 128/87 | HR 97 | Temp 98.0°F

## 2020-10-15 DIAGNOSIS — B20 Human immunodeficiency virus [HIV] disease: Secondary | ICD-10-CM

## 2020-10-15 DIAGNOSIS — D893 Immune reconstitution syndrome: Secondary | ICD-10-CM

## 2020-10-15 DIAGNOSIS — B582 Toxoplasma meningoencephalitis: Secondary | ICD-10-CM

## 2020-10-15 DIAGNOSIS — G939 Disorder of brain, unspecified: Secondary | ICD-10-CM

## 2020-10-15 DIAGNOSIS — R270 Ataxia, unspecified: Secondary | ICD-10-CM

## 2020-10-15 DIAGNOSIS — H538 Other visual disturbances: Secondary | ICD-10-CM

## 2020-10-15 DIAGNOSIS — R9082 White matter disease, unspecified: Secondary | ICD-10-CM

## 2020-10-15 NOTE — Progress Notes (Signed)
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   MRN: [id number]  Thomas Park is a 30 y.o. 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  1.  White matter changes on MRI:  Differential included JC virus related PML though with successful viral suppression systemically seems unlikely  JC virus was negative for the second time  IRIS to JC? Possible though would have shown iris much earlier in his course.  He was diagnosed in 2019.  In December of that year he had a viral load of 669,000 but within a month of antegrade strand transfer inhibitor therapy his viral load was down to 199 and CD4 did come up from 10 to 90.  His virus was imperfectly suppressed and CD4 count did take a while to come up but is now been above 200 since January in fact it now nearing 500  CD8 mediated encephalopathy possible  Pathology did not show malignant cells flow cytology is pending  CMV seemed  unlikely, I had SOME CMV in blood but I doubt this is a CMV CNS infection  CSF has been taken as a 3 white cells seen.  CSF  sent to [facility] for HIV RNA since HIV viral escape was  also a leading possible cause here  We gave him a trial of corticosteroids in the hospital with high-dose Solu-Medrol 500 mg every 12 hours x5 days without any improvement in his ataxia.  He was seen by [name] with ophthalmology who found no evidence of opportunistic infection in the eyes.  [name] did have some concerns that the patient might have some glaucoma in one eye and would like  to see him in the clinic.  He did intensify his HIV medication regimen by adding Pifeltro to his Biktarvy.  CSF from his lumbar puncture was sent to [facility] for AR and viral load came back at 50 copies.  This does not seem like viral escape to me and seems to me the reason we should not need to have his regimen intensified.  Because he did not improve on corticosteroids they were stopped and I stopped all of his antitoxoplasmosis drugs as he is had successful immune reconstitution for more than 6 months and has a normal CD4 count numerically.  He has since seen [name], MD a Neurologist at [facility] with particular expertise in Neuro HIV in   She has seen [name] at [facility]   Patient was referred to neuro-ophthalmology and was seen by Dr [name] who has recommended patient wearing patch or glasses to vision in one eye as this is the only maneuver that seems to correct the patient's problems with persistent diplopia.  He has not been seen by [facility] since August but was supposed to follow-up with Dr. [name].  I spoke with 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  While she and I both agree that it was likely too late for an intervention such as corticosteroids to make a difference in the fact that the  patient did not improve clinically while an inpatient on high-dose Solu-Medrol both from a clinical standpoint and radiographic standpoint argued against this.  However she felt that still be worthwhile giving him another trial of corticosteroids and we arrange for him to be seen in short stay and receive 1 g of Solu-Medrol daily for 3 days last week followed by a prednisone taper starting at 80 mg/day.  He has not had any improvement in his ataxia since then according to his father and him.  He continues to need to hold onto the walls to steady himself and other objects.   .      Past Medical History:   Diagnosis Date  . Anemia    [name] 08/10/2018  . Dysphagia 02/06/2020  . GERD (gastroesophageal reflux disease) 07/09/2020  . HIV (human immunodeficiency virus infection) ([place])    dx'd 07/2018  . 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 Neuropathy 09/21/2018  . 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[place]) 09/21/2018  . Right sided weakness 09/21/2018  . Tongue lesion 09/21/2018  . 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 NO PAST SURGERIES      No family history on file.    Social History   Socioeconomic History  . Marital status: Single    Spouse name: Not on file  . Number of children: Not on file  . Years of education: Not on file  . Highest education level: Not on file  Occupational History  . Not on file  Tobacco Use  . Smoking status: Never Smoker  . Smokeless tobacco: Never Used  Vaping Use  . Vaping Use: Never used  Substance and Sexual Activity  . Alcohol use: Yes    Alcohol/week: 6.0 standard drinks    Types: 6 Cans of beer per week    Comment: 08/10/2018 "drink only on weekend"  . Drug use: Not Currently    Comment: 08/10/2018 "have tried coke; been a long time since I've usd any"  . Sexual activity: Not Currently  Other Topics Concern  . Not on file  Social History Narrative  . Not on file   Social Determinants of Health   Financial Resource Strain: Not on file  Food Insecurity: Not on file  Transportation Needs: Not on file  Physical Activity: Not on file  Stress: Not on file  Social Connections: Not on file    Allergies  Allergen Reactions  . Sulfa Antibiotics Rash     Current Outpatient Medications:  .  BIKTARVY 50-200-25 MG TABS tablet, TAKE 1 TABLET BY MOUTH DAILY, Disp: 30 tablet, Rfl: 3 .  doravirine (PIFELTRO) 100 MG TABS tablet, Take 1 tablet (100 mg total) by mouth daily., Disp: 30 tablet, Rfl: 3 .  gabapentin (NEURONTIN) 100  MG capsule, Take 1 capsule (100 mg total) by mouth 2 (two) times daily between meals as needed., Disp: 60 capsule, Rfl: 1 .  omeprazole (PRILOSEC) 20 MG capsule, Take 1 capsule (20 mg total) by mouth daily., Disp: 30 capsule, Rfl: 11 .  ondansetron (ZOFRAN) 4 MG tablet, TAKE 1 TABLET BY MOUTH EVERY 8 HOURS AS NEEDED FOR NAUSEA AND VOMITING, Disp: 90 tablet, Rfl: 3 .  predniSONE (DELTASONE) 20 MG tablet, Take 4 tablets (80 mg total) by mouth daily with breakfast for 14 days, THEN 3 tablets (60 mg total) daily with breakfast for 14 days, THEN 2 tablets (40 mg total) daily with breakfast  for 14 days, THEN 1 tablet (20 mg total) daily with breakfast for 14 days. This is to be started after pt completes 3 days of IV solumedrol., Disp: 140 tablet, Rfl: 0 .  acetaminophen (TYLENOL) 325 MG tablet, Take 2 tablets (650 mg total) by mouth every 6 (six) hours as needed for mild pain (or Fever >/= 101). (Patient not taking: Reported on 10/15/2020), Disp: , Rfl:  .  fluconazole (DIFLUCAN) 100 MG tablet, TAKE 1 TABLET(100 MG) BY MOUTH DAILY (Patient not taking: Reported on 10/15/2020), Disp: 14 tablet, Rfl: 1 .  metoprolol tartrate (LOPRESSOR) 25 MG tablet, Take 0.5 tablets (12.5 mg total) by mouth 2 (two) times daily., Disp: 30 tablet, Rfl: 0    Review of Systems  Unable to perform ROS: Dementia  Eyes: Positive for visual disturbance.  Neurological: Positive for weakness.       Objective:   Physical Exam Constitutional:      General: He is not in acute distress.    Appearance: Normal appearance. He is well-developed. He is not ill-appearing or diaphoretic.  HENT:     Head: Normocephalic and atraumatic.     Right Ear: Hearing and external ear normal.     Left Ear: Hearing and external ear normal.     Nose: No nasal deformity or rhinorrhea.     Mouth/Throat:     Lips: Pink.     Mouth: Mucous membranes are moist.     Pharynx: Oropharynx is clear.  Eyes:     General: No scleral icterus.     Conjunctiva/sclera:     Right eye: Right conjunctiva is not injected.     Left eye: Left conjunctiva is not injected.     Comments: nystagmus  Neck:     Vascular: No JVD.  Cardiovascular:     Rate and Rhythm: Normal rate and regular rhythm.     Heart sounds: S1 normal and S2 normal.  Pulmonary:     Effort: Pulmonary effort is normal. No respiratory distress.  Abdominal:     General: There is no distension.  Musculoskeletal:        General: Normal range of motion.     Right shoulder: Normal.     Left shoulder: Normal.     Cervical back: Normal range of motion and neck supple.     Right hip: Normal.     Left hip: Normal.     Right knee: Normal.     Left knee: Normal.  Lymphadenopathy:     Head:     Right side of head: No submandibular, preauricular or posterior auricular adenopathy.     Left side of head: No submandibular, preauricular or posterior auricular adenopathy.     Cervical:     Right cervical: No superficial or deep cervical adenopathy.    Left cervical: No superficial or deep cervical adenopathy.  Skin:    General: Skin is warm and dry.     Coloration: Skin is not pale.     Findings: No abrasion, bruising, ecchymosis, erythema, lesion or rash.     Nails: There is no clubbing.  Neurological:     Mental Status: He is alert. He is confused.     Sensory: No sensory deficit.     Motor: Weakness present.     Coordination: Coordination abnormal.  Psychiatric:        Attention and Perception: He is attentive.        Mood and Affect: Mood normal.  Speech: Speech normal.        Behavior: Behavior normal. Behavior is cooperative.        Cognition and Memory: Cognition is impaired.      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  Really not sure what the cause of this is.  It is clearly not PML related to [place] virus with JC virus not having been detected on 2 different attempts and with his immune system being normal numerically  for more than 1/2-year.  HIV escape seems unlikely and a viral load of 50 in the CNS does not seem to be to be terribly significant.  CD8 mediated cephalopathy was on it was considered well but this was not found on the cytometry.  He did not have any evidence of an opportunistic infection in the CNS or eye when exam by ophthalmology.  I am discontinuing his Pifeltro as I do not see an indication nor does [name] for intensification as there is no real evidence of CSF escape requiring another antiviral  I wonder very much if he had an IRIS flare past Spring that is now over and not change-  We have tried corticosteroids and they do not seem to be having an impact but I will let him finish his course and reevaluate him again in a few weeks  Toxoplasmosis I have stopped his antitoxoplasma drugs as he has had more than 6 months of successful immune reconstitution.  HIV disease: Continue Biktarvy and discontinued the felt  COVID prevention he has had his vaccines for COVID-19 and he has had all 3 shot  GERD: On omeprazole  Diplopia: due to CNS pathology and needs patch  I spent greater than 40 minutes with the patient including greater than 50% of time in face to face counsel of the patient his father via in person Spanish translator regarding the possible diagnosis of high risk, the white matter changes his HIV disease is toxoplasmosis and coordination of his care.[name] Kitchen

## 2020-10-21 ENCOUNTER — Telehealth: Payer: Self-pay | Admitting: *Deleted

## 2020-10-21 NOTE — Telephone Encounter (Signed)
Cami Burkhardt from [facility] from [employer] [facility] therapy clinic called requesting patients contact information. This was noted in patients chart on 04/20/20 that the clinic would contact the patient for follow up.

## 2020-11-04 ENCOUNTER — Encounter: Payer: Self-pay | Admitting: Infectious Disease

## 2020-11-07 ENCOUNTER — Ambulatory Visit: Payer: Self-pay | Admitting: Infectious Disease

## 2020-11-09 IMAGING — MR MR HEAD WO/W CM
6 of 12 series · 26 of 48 positions shown · IV contrast (gadavist)
Comparison: 04/12/2020 MRI head and prior.

CLINICAL DATA: Toxoplasmosis.

EXAM:
MRI HEAD WITHOUT AND WITH CONTRAST
TECHNIQUE: Multiplanar, multiecho pulse sequences of the brain and surrounding
structures were obtained without and with intravenous contrast.
CONTRAST:  9mL GADAVIST GADOBUTROL 1 MMOL/ML IV SOLN

[Series 3: DWI · axial · 3.0mm · 0.94mm/px · z∈[-53,+92]mm · 9 of 100 slices shown (1 of 2)]
[im 1/100]
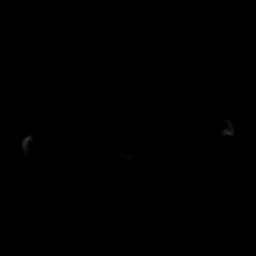
[im 13/100]
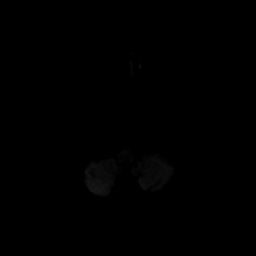
[im 25/100]
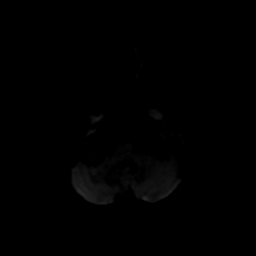
[im 38/100]
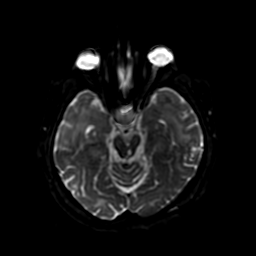
[im 50/100]
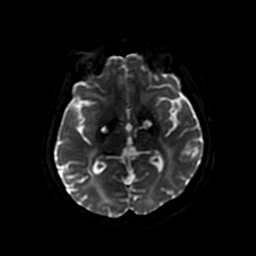
[im 62/100]
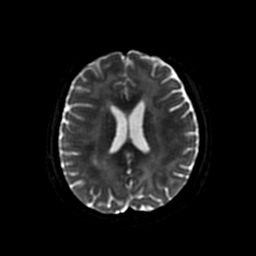
[im 75/100]
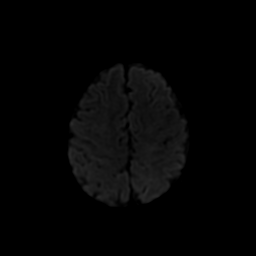
[im 87/100]
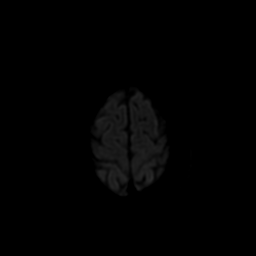
[im 100/100]
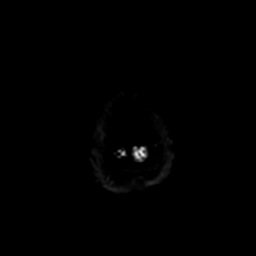

[Series 4: DWI · coronal · 4.0mm · 0.94mm/px · 6 of 72 slices shown (2 of 2)]
[im 1/72]
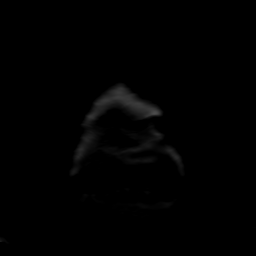
[im 15/72]
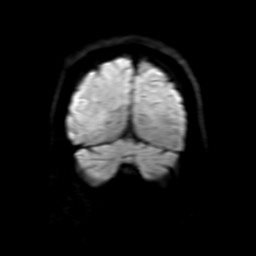
[im 29/72]
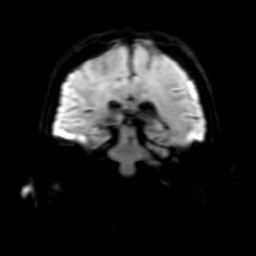
[im 43/72]
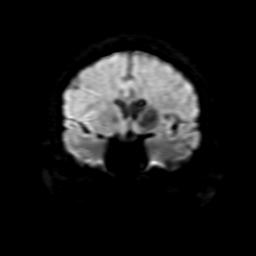
[im 57/72]
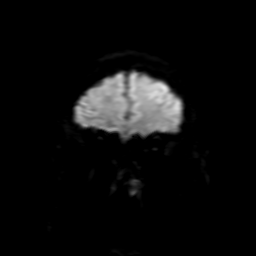
[im 72/72]
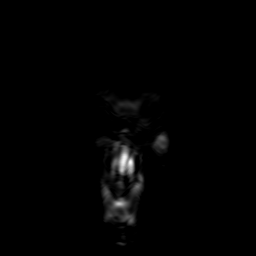

[Series 5: FLAIR · sagittal · 5.0mm · 0.23mm/px · 2 of 25 slices shown (1 of 2)]
[im 1/25]
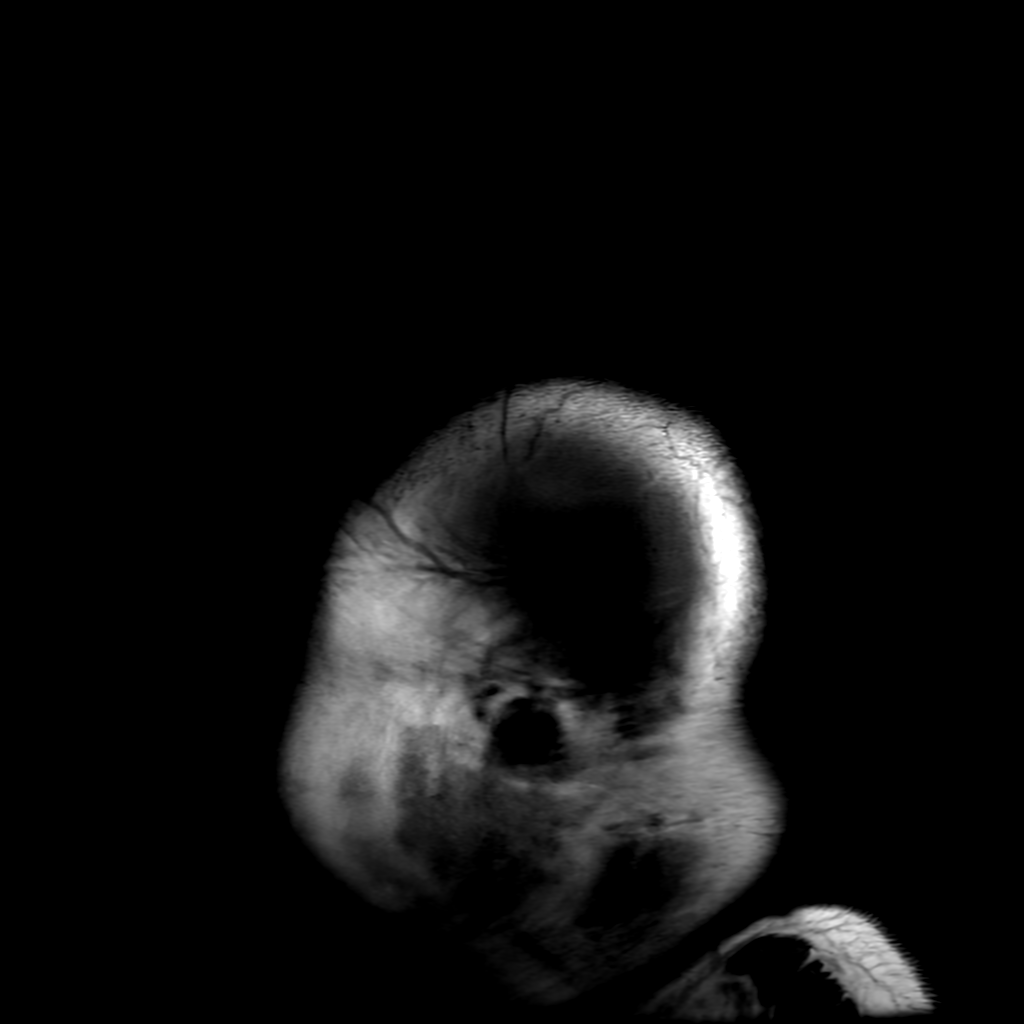
[im 25/25]
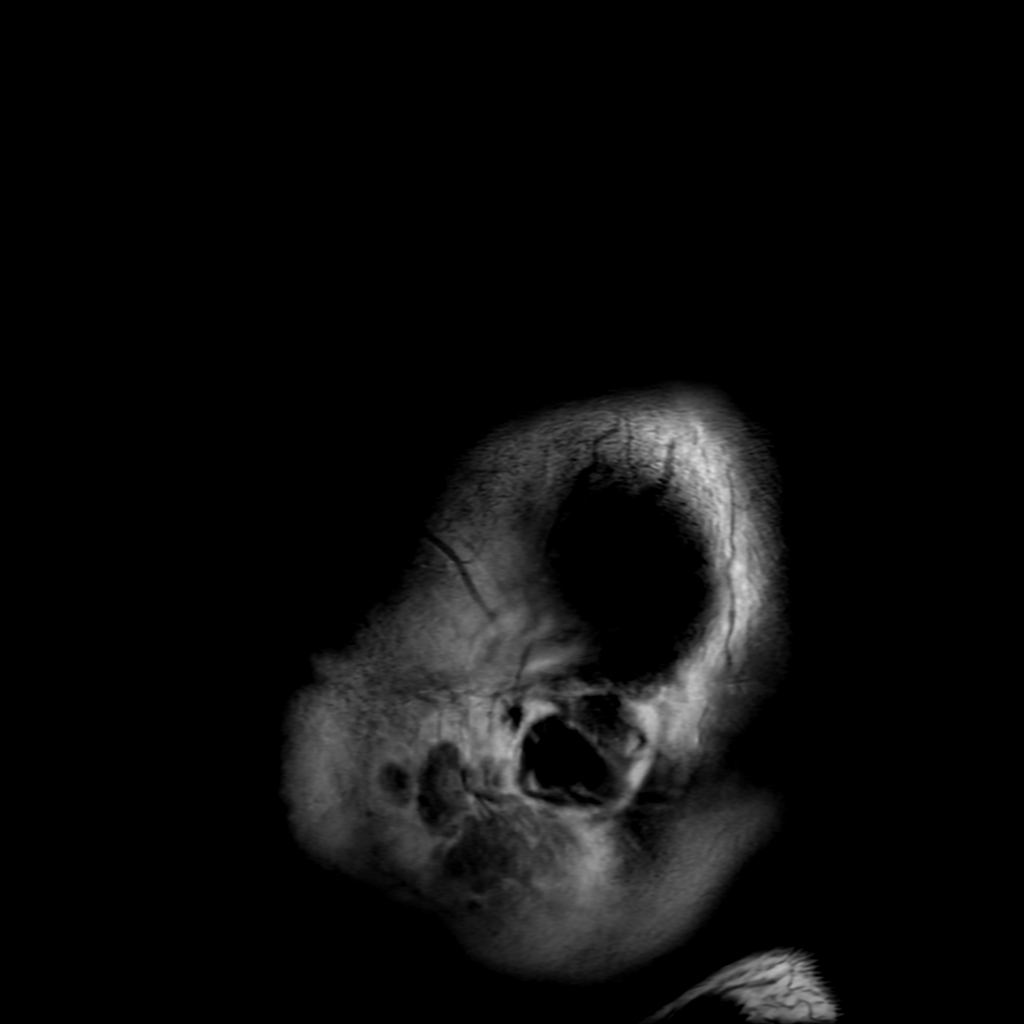

[Series 7: FLAIR · axial · 3.0mm · 0.41mm/px · z∈[-50,+92]mm · 2 of 25 slices shown (2 of 2)]
[im 1/25]
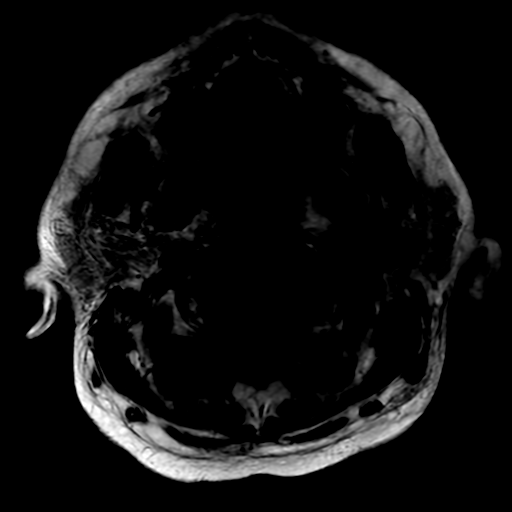
[im 25/25]
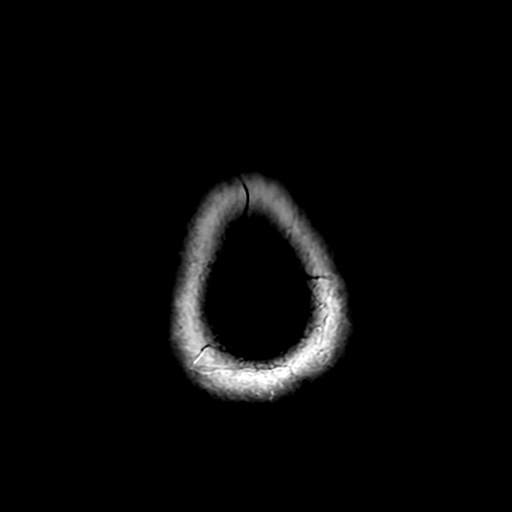

[Series 350: ADC · axial · 3.0mm · 0.94mm/px · z∈[-53,+92]mm · 4 of 49 slices shown (1 of 2)]
[im 1/49]
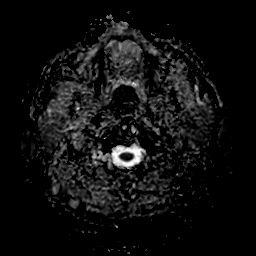
[im 17/49]
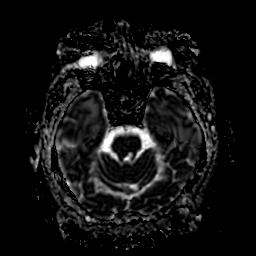
[im 33/49]
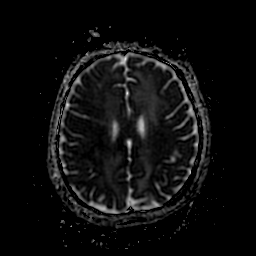
[im 49/49]
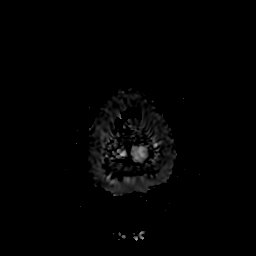

[Series 450: ADC · coronal · 4.0mm · 0.94mm/px · 3 of 36 slices shown (2 of 2)]
[im 1/36]
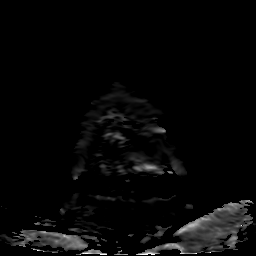
[im 18/36]
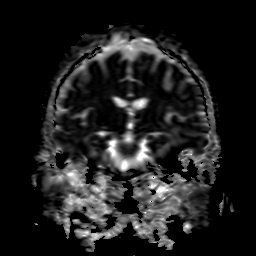
[im 36/36]
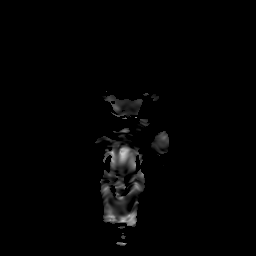

[26 of 48 positions shown; findings below may reference images not displayed]

FINDINGS: Image quality is degraded by motion artifact.

Brain: Multifocal T2 hyperintense foci involving the basal ganglia,
thalami, cerebellum, brainstem and right occipitotemporal region are
unchanged. Reference left basal ganglia lesion measuring 1.5 x
cm ([DATE]) is unchanged. Associated micronodular and thin peripheral
enhancement most prominent in the left basal ganglia lesion ([DATE])
is less conspicuous than prior exam. No new lesions. No new focus of
abnormal enhancement.

Bilateral cerebral convexity pachymeningeal prominence is unchanged.
No diffusion-weighted signal abnormality. Bilateral basal ganglia
SWI signal dropout is unchanged.

Mild diffuse parenchymal volume loss with ex vacuo dilatation,
stable from prior exam and advanced for patient's age. Confluent
T2/FLAIR hyperintense supratentorial white matter signal is
unchanged. No midline shift, ventriculomegaly or extra-axial fluid
collection.

Vascular: Grossly normal flow voids.

Skull and upper cervical spine: Within normal limits.

Sinuses/Orbits: Normal orbits. Clear paranasal sinuses. No mastoid
effusion.

Other: None.
IMPRESSION: 1. Motion artifact limits evaluation.
2. Multifocal T2 hyperintensities involving the basal ganglia,
thalami, cerebellum, brainstem and right occipitotemporal region are
unchanged in size. Associated micronodular/thin peripheral
enhancement is less conspicuous than prior exam. No new lesions.
3. Confluent supratentorial white matter signal abnormality is
unchanged and likely reflects HIV encephalopathy.
4. Mild cerebral volume loss is unchanged and advanced for patient's
age.

## 2020-11-12 ENCOUNTER — Other Ambulatory Visit: Payer: Self-pay | Admitting: Infectious Disease

## 2020-11-12 ENCOUNTER — Telehealth: Payer: Self-pay | Admitting: *Deleted

## 2020-11-12 DIAGNOSIS — R270 Ataxia, unspecified: Secondary | ICD-10-CM

## 2020-11-12 NOTE — Telephone Encounter (Signed)
Thanks! I'll let Audelia Hives know!

## 2020-11-12 NOTE — Telephone Encounter (Signed)
Order int thanks [place]!

## 2020-11-12 NOTE — Telephone Encounter (Signed)
Patient's sister contacted Audelia Hives after [facility] visit for follow up. Patient seen by neurology at [facility], but the neurologist requests help in local referral for physical therapy. Notes available via Care Everywhere.  Dx:  Ataxia, (ICD10 R27.0) Patient with ataxia and severe difficulty ambulating. Please evaluate if patient needs AFO, walker, work on balance training and gait safety. Landis Gandy, RN

## 2020-11-20 ENCOUNTER — Other Ambulatory Visit: Payer: Self-pay

## 2020-11-20 ENCOUNTER — Ambulatory Visit: Payer: Self-pay | Attending: Infectious Disease

## 2020-11-20 DIAGNOSIS — R2689 Other abnormalities of gait and mobility: Secondary | ICD-10-CM | POA: Insufficient documentation

## 2020-11-20 DIAGNOSIS — M6281 Muscle weakness (generalized): Secondary | ICD-10-CM | POA: Insufficient documentation

## 2020-11-20 DIAGNOSIS — R2681 Unsteadiness on feet: Secondary | ICD-10-CM | POA: Insufficient documentation

## 2020-11-20 DIAGNOSIS — R270 Ataxia, unspecified: Secondary | ICD-10-CM | POA: Insufficient documentation

## 2020-11-21 NOTE — Therapy (Signed)
[address] [place], [place], [address] Phone: [phone]   Fax:  [phone]  Physical Therapy Evaluation  Patient Details  Name: Thomas Park MRN: [id number] Date of Birth: 1990/10/23 Referring Provider (PT): Rhina Brackett Dam MD - Infectious Disease. Also Seeing [name] MD Neurology at [facility] Date: 11/20/2020   PT End of Session - 11/20/20 1203    Visit Number 1    Number of Visits 9    Date for PT Re-Evaluation 01/15/21    PT Start Time 0930    PT Stop Time 1015    PT Time Calculation (min) 45 min    Equipment Utilized During Treatment Gait belt    Activity Tolerance Patient tolerated treatment well;Patient limited by fatigue;Other (comment)   limited by weakness   Behavior During Therapy [facility] for tasks assessed/performed           Past Medical History:  Diagnosis Date  . Anemia    [name] 08/10/2018  . Dysphagia 02/06/2020  . GERD (gastroesophageal reflux disease) 07/09/2020  . HIV (human immunodeficiency virus infection) ([place])    dx'd 07/2018  . 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 Neuropathy 09/21/2018  . 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[place]) 09/21/2018  . Right sided weakness 09/21/2018  . Tongue lesion 09/21/2018  . 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 NO PAST SURGERIES      There were no vitals filed for this visit.    Subjective Assessment - 11/20/20 0934    Subjective Pt arrived to session with Father, Ethel Rana. Both primarily [place] speaking communicating with spanish speaking therapist. Has had therapy in the past most recent last year. Most difficulty with walking, not using walker or anything because per father weaker on the right leg and if loses balance he will fall with walker and all.  Lives at home with father and his younger sister.  Live in a house, small steps x 3 to enter otherwise one floor and hold on to a banister/family to be able to complete steps.  Primarily at home during the day, unable to work, able to be in home alone but otherwise assisted by family as needed. Father reports he gets distracted sometimes with memory difficulties    Patient is accompained by: Family member   Pedro   Pertinent History PMHx: AIDS, CNS toxoplasmosis, Progressive multifocal leukoencephalopathy, Dementia, Visual disturbance/nystagmus pt reports always having double vision.    Patient Stated Goals to walk a little better    Currently in Pain? No/denies              [facility] PT Assessment - 11/20/20 0942      Assessment   Medical Diagnosis Ataxia    Referring Provider (PT) Alcide Evener MD - Infectious Disease. Also Seeing [name] MD Neurology at [facility]    Onset Date/Surgical Date --   October 2019 started wlaking difficulties   Hand Dominance Right    Prior Therapy yes      Balance Screen   Has the patient fallen in the past 6 months No    Has the patient had a decrease in activity level because of a fear of falling?  Yes    Is the patient reluctant to leave their home because of a fear of falling?  Yes  Prior Function   Level of Independence --   Needs assistance with A with balance with bathing, preparing meals.  Needs A from family ot get in/out of car, to walk in community. Furniture surfs at home when walking     Cognition   Overall Cognitive Status Impaired/Different from baseline   reports memory difficulties     Coordination   Gross Motor Movements are Fluid and Coordinated No    Coordination and Movement Description Shaky, ataxic. some rhythmic movement noted in the R arm/hand more pronounced with balance challenges and gait      Posture/Postural Control   Posture/Postural Control Postural limitations    Postural Limitations Rounded Shoulders;Forward head;Weight shift left;Posterior pelvic tilt      ROM  / Strength   AROM / PROM / Strength Strength      Strength   Overall Strength Comments RLE: hip flex 3+/5, DF 3+/5 - although with toe drag functionally with gait. Left hip flex 4-/5. B Knee ext 4/5 reported more effort on right,      Ambulation/Gait   Assistive device --   BUE supported/ B HHA   Gait Pattern Step-to pattern;Decreased arm swing - right;Decreased arm swing - left;Decreased step length - right;Decreased stride length;Decreased dorsiflexion - right;Decreased weight shift to right;Right circumduction;Left circumduction;Wide base of support;Poor foot clearance - right;Poor foot clearance - left   right toe drag more pronounced with fatigue   Ambulation Surface Level;Indoor    Stairs Yes    Stair Management Technique Two rails;Alternating pattern   4 inch, mod guarding and cues for pacing, unsteady     Standardized Balance Assessment   Standardized Balance Assessment Berg Balance Test      Berg Balance Test   Sit to Stand Able to stand  independently using hands   increased WS left   Standing Unsupported Able to stand 30 seconds unsupported   mod sway  requires supervision   Sitting with Back Unsupported but Feet Supported on Floor or Stool Able to sit safely and securely 2 minutes    Stand to Sit Uses backs of legs against chair to control descent    Transfers Needs one person to assist    Standing Unsupported with Eyes Closed Needs help to keep from falling    Standing Unsupported with Feet Together Needs help to attain position and unable to hold for 15 seconds    From Standing, Reach Forward with Outstretched Arm Loses balance while trying/requires external support    From Standing Position, Pick up Object from Floor Unable to try/needs assist to keep balance    From Standing Position, Turn to Look Behind Over each Shoulder Needs supervision when turning   WBOS, mod sway   Turn 360 Degrees Needs assistance while turning    Standing Unsupported, Alternately Place Feet on  Step/Stool Needs assistance to keep from falling or unable to try    Standing Unsupported, One Foot in [employer] balance while stepping or standing    Standing on One Leg Unable to try or needs assist to prevent fall    Total Score 13    Berg comment: < 36 high risk of falls                  Vestibular Assessment - 11/20/20 0947      Vestibular Assessment   General Observation Reports occasional dizziness but not dialy moreso when reaching down, frequent double vision.      Oculomotor Exam   Spontaneous Left  beating nystagmus   fast phase ot the left at rest   Gaze-induced  Left beating nystagmus with L gaze   possible right beating with right gaze, need to assess right further.   Smooth Pursuits Intact   nystagmus throughout   Comment VOR vertical and horizontal grossly slow with nystagmus present but no sx reported              Objective measurements completed on examination: See above findings.               PT Education - 11/20/20 1011    Education Details Initial PT POC and HEP (translated to spanish):Access Code: [email or web address]  URL: [email or web address]  Date: 11/20/2020  Prepared by: Sherlynn Stalls    Exercises  Seated inner thigh squeeze with ball or pillow - 1 x daily - 7 x weekly - 2 sets - 10 reps  Seated March - 1 x daily - 7 x weekly - 2 sets - 10 reps  Sit to Stand with Counter Support - 1 x daily - 7 x weekly - 2 sets - 10 reps  Seated Ankle Dorsiflexion AROM - 1 x daily - 7 x weekly - 2 sets - 10 reps    Person(s) Educated Patient;Parent(s)    Methods Explanation;Demonstration;Handout    Comprehension Verbalized understanding;Returned demonstration;Need further instruction            PT Short Term Goals - 11/20/20 1223      PT SHORT TERM GOAL #1   Title independent with advanced HEP with assistance/supervision of family as needed for safety    Time 2    Period Weeks    Status New    Target Date 12/04/20              PT Long Term Goals - 11/20/20 1224      PT LONG TERM GOAL #1   Title Patient/family will be independent with advanced HEP (including a walking program) and ways to continue community-based activity.    Time 8    Period Weeks    Status New    Target Date 01/15/21      PT LONG TERM GOAL #2   Title Patient will ambulate independently in simulated home environment independently or with LRAD without LOB    Time 8    Period Weeks    Status New    Target Date 01/15/21      PT LONG TERM GOAL #3   Title Patient will ambulate over unlevel outdoor terrain (including slopes, curbs, grass, mulch, etc) x 500 ft with supervision for safety.     Time 8    Period Weeks    Status New    Target Date 01/15/21      PT LONG TERM GOAL #4   Title Patient will improve BBS to >/= 37 to demo decreased risk of falls    Baseline 13/56    Time 8    Period Weeks    Status New    Target Date 01/15/21                  Plan - 11/20/20 1205    Clinical Impression Statement Pt is a 30 yo male who was referred to physical therapy for ataxia and severe difficulty ambulating. PMH significant for AIDS, CNS toxoplasmosis, Progressive multifocal leukoencephalopathy, Dementia, Visual disturbances/nystagmus. Pt presents with significant muscle weakness (RLE>>LLE), abnormal gait with wide base and intermittent toe drag and requiring assist for stability, diminished balance with  some reports of dizziness that are occasional and pretty frequent double vision. As a result patient is unsafe with ambulation requirng UE assist or UE support. Father reports they have not had much luck with trying a walker in the past because of his balance. Discussed PT recommendations for therapy but family is currently self pay and unsure if they will be able to come in for sessions, family to follow up regarding appointments. Pt will benefit from skilled physical therapy to work on improving balance/strength, safety with gait, in addition  to instruction in home program with family training for safety in the home. Possible DME and orthotic needs to be further assessed in next session.    Personal Factors and Comorbidities Comorbidity 3+;Past/Current Experience;Time since onset of injury/illness/exacerbation;Finances;Fitness    Examination-Activity Limitations Locomotion Level;Bend;Stairs;Squat;Stand;Lift    Stability/Clinical Decision Making Evolving/Moderate complexity    Clinical Decision Making Moderate    Rehab Potential Fair    PT Frequency 1x / week    PT Duration 8 weeks    PT Treatment/Interventions ADLs/Self Care Home Management;Neuromuscular re-education;Therapeutic exercise;Therapeutic activities;Functional mobility training;Electrical Stimulation;Stair training;Patient/family education;Orthotic Fit/Training;Manual techniques;Energy conservation;Vestibular;Taping    PT Next Visit Plan Further assess gait, DME and/or orthotic needs in subsequent session. Work on strengthening, Higher education careers adviser.    PT Home Exercise Plan see pt edu    Consulted and Agree with Plan of Care Patient;Family member/caregiver    Family Member Consulted Father Pedro           Patient will benefit from skilled therapeutic intervention in order to improve the following deficits and impairments:  Abnormal gait,Decreased coordination,Difficulty walking,Dizziness,Decreased endurance,Impaired vision/preception,Decreased balance,Decreased strength,Decreased mobility,Postural dysfunction  Visit Diagnosis: Ataxia - Plan: PT plan of care cert/re-cert  Muscle weakness (generalized) - Plan: PT plan of care cert/re-cert  Other abnormalities of gait and mobility - Plan: PT plan of care cert/re-cert  Unsteadiness on feet - Plan: PT plan of care cert/re-cert     Problem List Patient Active Problem List   Diagnosis Date Noted  . GERD (gastroesophageal reflux disease) 07/09/2020  . Ataxia 04/24/2020  . Blurry vision   . White matter  abnormality on MRI of brain 02/29/2020  . Cognitive decline 02/29/2020  . Visual disturbance 02/29/2020  . Dysphagia 02/06/2020  . 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[place]) 09/21/2018  . 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 Tongue lesion 09/21/2018  . Right sided weakness 09/21/2018  . Neuropathy 09/21/2018  . 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 Drug rash   . Neurologic gait disorder   . Brain lesion   . Confusion   . Hyponatremia   . Substance abuse ([place])   . Encephalopathy acute 08/10/2018  . AIDS ([place])   . 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[place])     Hall Busing, PT, DPT 11/21/2020, 11:31 AM  [address] [place], [place], [address] Phone: [phone]   Fax:  [phone]  Name: Seith Aikey MRN: [id number] Date of Birth: [birth year]

## 2020-11-26 ENCOUNTER — Ambulatory Visit: Payer: Self-pay | Admitting: Physical Therapy

## 2020-12-10 ENCOUNTER — Ambulatory Visit (INDEPENDENT_AMBULATORY_CARE_PROVIDER_SITE_OTHER): Payer: Self-pay | Admitting: Infectious Disease

## 2020-12-10 ENCOUNTER — Other Ambulatory Visit: Payer: Self-pay

## 2020-12-10 ENCOUNTER — Encounter: Payer: Self-pay | Admitting: Infectious Disease

## 2020-12-10 ENCOUNTER — Other Ambulatory Visit: Payer: Self-pay | Admitting: Infectious Disease

## 2020-12-10 VITALS — BP 159/98 | HR 83 | Temp 97.9°F | Wt 225.0 lb

## 2020-12-10 DIAGNOSIS — G939 Disorder of brain, unspecified: Secondary | ICD-10-CM

## 2020-12-10 DIAGNOSIS — H538 Other visual disturbances: Secondary | ICD-10-CM

## 2020-12-10 DIAGNOSIS — R9082 White matter disease, unspecified: Secondary | ICD-10-CM

## 2020-12-10 DIAGNOSIS — B20 Human immunodeficiency virus [HIV] disease: Secondary | ICD-10-CM

## 2020-12-10 DIAGNOSIS — B582 Toxoplasma meningoencephalitis: Secondary | ICD-10-CM

## 2020-12-10 DIAGNOSIS — R270 Ataxia, unspecified: Secondary | ICD-10-CM

## 2020-12-10 DIAGNOSIS — R269 Unspecified abnormalities of gait and mobility: Secondary | ICD-10-CM

## 2020-12-10 DIAGNOSIS — D893 Immune reconstitution syndrome: Secondary | ICD-10-CM

## 2020-12-10 DIAGNOSIS — R4189 Other symptoms and signs involving cognitive functions and awareness: Secondary | ICD-10-CM

## 2020-12-10 NOTE — Telephone Encounter (Signed)
Would you prefer Prednisone be filled [name]? Please advise. Thanks

## 2020-12-10 NOTE — Telephone Encounter (Signed)
Prednisone being reviewed by Dr. Tommy Medal. Refills will be updated if needed

## 2020-12-10 NOTE — Progress Notes (Signed)
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   MRN: [id number]  Thomas Park is a 30 y.o. 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  1.  White matter changes on MRI:  Differential included JC virus related PML though with successful viral suppression systemically seems unlikely  JC virus was negative for the second time  IRIS to JC? Possible though would have shown iris much earlier in his course.  He was diagnosed in 2019.  In December of that year he had a viral load of 669,000 but within a month of antegrade strand transfer inhibitor therapy his viral load was down to 199 and CD4 did come up from 10 to 90.  His virus was imperfectly suppressed and CD4 count did take a while to come up but is now been above 200 since January in fact it now nearing 500  CD8 mediated encephalopathy possible  Pathology did not show malignant cells flow cytology is pending  CMV seemed  unlikely, I had SOME CMV in blood but I doubt this is a CMV CNS infection  CSF has been taken as a 3 white cells seen.  CSF  sent to [facility] for HIV RNA since HIV viral escape was  also a leading possible cause here  We gave him a trial of corticosteroids in the hospital with high-dose Solu-Medrol 500 mg every 12 hours x5 days without any improvement in his ataxia.  He was seen by [name] with ophthalmology who found no evidence of opportunistic infection in the eyes.  [name] did have some concerns that the patient might have some glaucoma in one eye and would like  to see him in the clinic.  He did intensify his HIV medication regimen by adding Pifeltro to his Biktarvy.  CSF from his lumbar puncture was sent to [facility] for AR and viral load came back at 50 copies.  This does not seem like viral escape to me and seems to me the reason we should not need to have his regimen intensified.  Because he did not improve on corticosteroids they were stopped and I stopped all of his antitoxoplasmosis drugs as he is had successful immune reconstitution for more than 6 months and has a normal CD4 count numerically.  He has since seen [name], MD a Neurologist at [facility] with particular expertise in Neuro HIV in   He has seen [name] at [facility] Neurology   Patient was referred to neuro-ophthalmology and was seen by Dr [name] who has recommended patient wearing patch or glasses to vision in one eye as this is the only maneuver that seems to correct the patient's problems with persistent diplopia.  He has not been seen by [facility] since August but was supposed to follow-up with Dr. [name].  I spoke with 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  While she and I both agree that it was likely too late for an intervention such as corticosteroids to make a difference in the fact that the  patient did not improve clinically while an inpatient on high-dose Solu-Medrol both from a clinical standpoint and radiographic standpoint argued against this.  However she felt that still be worthwhile giving him another trial of corticosteroids and we arrange for him to be seen in short stay and receive 1 g of Solu-Medrol daily for 3 days last week followed by a prednisone taper starting at 80 mg/day.  He had not had any improvement in his ataxia since then according to his father and him at his last visit.  I was able to get him into physical therapy and they have done 1 initial assessment but are waiting for  approval.  Apparently they need to bring further paperwork.  He is accompanied today by his sister.  He is still on prednisone and according to the sister [name] wants him to be on prednisone through August at which point in time she is going to repeat an MRI.  The sister is concerned that the patient is been gaining weight and that the weight gain is getting in the way of him improving his functional status.        Past Medical History:  Diagnosis Date  . Anemia    [name] 08/10/2018  . Dysphagia 02/06/2020  . GERD (gastroesophageal reflux disease) 07/09/2020  . HIV (human immunodeficiency virus infection) ([place])    dx'd 07/2018  . 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 Neuropathy 09/21/2018  . 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[place]) 09/21/2018  . Right sided weakness 09/21/2018  . Tongue lesion 09/21/2018  . 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 NO PAST SURGERIES      No family history on file.    Social History   Socioeconomic History  . Marital status: Single    Spouse name: Not on file  . Number of children: Not on file  . Years of education: Not on file  . Highest education level: Not on file  Occupational History  . Not on file  Tobacco Use  . Smoking status: Never Smoker  . Smokeless tobacco: Never Used  Vaping Use  . Vaping Use: Never used  Substance and Sexual Activity  . Alcohol use: Yes    Alcohol/week: 6.0 standard drinks    Types: 6 Cans of beer per week    Comment: 08/10/2018 "drink only on weekend"  . Drug use: Not Currently    Comment: 08/10/2018 "have tried coke; been a long time since I've usd any"  . Sexual activity: Not Currently  Other Topics Concern  . Not on file  Social History Narrative  . Not on file   Social Determinants of Health   Financial Resource Strain: Not on file  Food Insecurity: Not on file   Transportation Needs: Not on file  Physical Activity: Not on file  Stress: Not on file  Social Connections: Not on file    Allergies  Allergen Reactions  . Sulfa Antibiotics Rash     Current Outpatient Medications:  .  acetaminophen (TYLENOL) 325 MG tablet, Take 2 tablets (650 mg total) by mouth every 6 (six) hours as needed for mild pain (or Fever >/= 101). (Patient not taking: Reported on 10/15/2020), Disp: , Rfl:  .  BIKTARVY 50-200-25 MG TABS tablet, TAKE 1 TABLET BY MOUTH DAILY, Disp: 30 tablet, Rfl: 3 .  omeprazole (PRILOSEC) 20 MG capsule, Take 1 capsule (20 mg total) by mouth  daily., Disp: 30 capsule, Rfl: 11 .  ondansetron (ZOFRAN) 4 MG tablet, TAKE 1 TABLET BY MOUTH EVERY 8 HOURS AS NEEDED FOR NAUSEA AND VOMITING, Disp: 90 tablet, Rfl: 3    Review of Systems  Unable to perform ROS: Dementia  Eyes: Positive for visual disturbance.  Neurological: Positive for weakness.       Objective:   Physical Exam Constitutional:      General: He is not in acute distress.    Appearance: Normal appearance. He is well-developed. He is obese. He is not ill-appearing or diaphoretic.  HENT:     Head: Normocephalic and atraumatic.     Right Ear: Hearing and external ear normal.     Left Ear: Hearing and external ear normal.     Nose: No nasal deformity or rhinorrhea.     Mouth/Throat:     Lips: Pink.     Mouth: Mucous membranes are moist.     Pharynx: Oropharynx is clear.  Eyes:     General: No scleral icterus.    Conjunctiva/sclera:     Right eye: Right conjunctiva is not injected.     Left eye: Left conjunctiva is not injected.     Comments: nystagmus  Neck:     Vascular: No JVD.  Cardiovascular:     Rate and Rhythm: Normal rate and regular rhythm.     Heart sounds: S1 normal and S2 normal.  Pulmonary:     Effort: Pulmonary effort is normal. No respiratory distress.  Abdominal:     General: There is no distension.  Musculoskeletal:        General: Normal range of  motion.     Right shoulder: Normal.     Left shoulder: Normal.     Cervical back: Normal range of motion and neck supple.     Right hip: Normal.     Left hip: Normal.     Right knee: Normal.     Left knee: Normal.  Lymphadenopathy:     Head:     Right side of head: No submandibular, preauricular or posterior auricular adenopathy.     Left side of head: No submandibular, preauricular or posterior auricular adenopathy.     Cervical:     Right cervical: No superficial or deep cervical adenopathy.    Left cervical: No superficial or deep cervical adenopathy.  Skin:    General: Skin is warm and dry.     Coloration: Skin is not pale.     Findings: No abrasion, bruising, ecchymosis, erythema, lesion or rash.     Nails: There is no clubbing.  Neurological:     Mental Status: He is alert. He is confused.     Sensory: No sensory deficit.     Motor: Weakness present.     Coordination: Coordination abnormal.  Psychiatric:        Attention and Perception: He is attentive.        Mood and Affect: Mood normal.        Speech: Speech normal.        Behavior: Behavior normal. Behavior is cooperative.        Cognition and Memory: Cognition is impaired.     Comments: He hardly talked at all today but followed conversation some      Assessment & Plan:   Ataxia new white matter lesions seen on imaging and may.  Really not sure what the cause of this is.  It is clearly not PML related to [place] virus with  JC virus not having been detected on 2 different attempts and with his immune system being normal numerically for more than 1/2-year.  HIV escape seems unlikely and a viral load of 50 in the CNS does not seem to be to be terribly significant.  CD8 mediated cephalopathy was on it was considered well but this was not found on the cytometry.  He did not have any evidence of an opportunistic infection in the CNS or eye when exam by ophthalmology.  I discontinued  his Pifeltro as I do not see an  indication nor does [name] for intensification as there is no real evidence of CSF escape requiring another antiviral  I wonder very much if he had an IRIS flare past Spring that is now over and not change-  We have tried corticosteroids and they do not seem have any impact  I need to clarify with [name] if she does not fact want him to continue on the corticosteroids through August.  Weight gain: He was already gaining weight before the corticosteroids but he is gaining further weight.  Again will discuss with [name] the steroids are temporary thing but he certainly has been putting on weight and family are bothered by that at this point in time  Toxoplasmosis I have stopped his antitoxoplasma drugs as he has had more than 6 months of successful immune reconstitution.  HIV disease: Continue Biktarvy and discontinued the felt  COVID prevention he has had his vaccines for COVID-19 and he has had all 3 shot  GERD: On omeprazole  Diplopia: due to CNS pathology and needs patch . .I spent greater than 35 minutes with the patient including greater than 50% of time in face to face counsel of the patient and his sister regarding the patient's HIV his white matter lesions in the CNS the reasons for the corticosteroids have been giving physical therapy and in coordination of his care.

## 2020-12-11 ENCOUNTER — Telehealth: Payer: Self-pay | Admitting: Infectious Disease

## 2020-12-11 LAB — URINE CYTOLOGY ANCILLARY ONLY
Chlamydia: NEGATIVE
Comment: NEGATIVE
Comment: NORMAL
Neisseria Gonorrhea: NEGATIVE

## 2020-12-11 LAB — T-HELPER CELL (CD4) - (RCID CLINIC ONLY)
CD4 % Helper T Cell: 17 % — ABNORMAL LOW (ref 33–65)
CD4 T Cell Abs: 465 /uL (ref 400–1790)

## 2020-12-11 MED ORDER — PREDNISONE 5 MG PO TABS: ORAL_TABLET | ORAL | 0 refills | Status: AC

## 2020-12-11 NOTE — Telephone Encounter (Signed)
Got in touch with [name] at [facility]  SInce steroids not helping she is ok with steroid taper  It is as follows 15mg  daily x 7 days then 10mg  sx 7 days then 5mg  x 7 days and then stop  She is very adamant that [name] be able to get PT  She is wondering if there are programs to support this in [place]  If not we can look at trying to get him to [facility] or maybe [facility]  Can we get in touch with [name] to let him and family know

## 2020-12-12 LAB — CBC WITH DIFFERENTIAL/PLATELET
Absolute Monocytes: 910 cells/uL (ref 200–950)
Basophils Absolute: 75 cells/uL (ref 0–200)
Basophils Relative: 0.7 %
Eosinophils Absolute: 171 cells/uL (ref 15–500)
Eosinophils Relative: 1.6 %
HCT: 48.6 % (ref 38.5–50.0)
Hemoglobin: 16.3 g/dL (ref 13.2–17.1)
Lymphs Abs: 2921 cells/uL (ref 850–3900)
MCH: 28.1 pg (ref 27.0–33.0)
MCHC: 33.5 g/dL (ref 32.0–36.0)
MCV: 83.8 fL (ref 80.0–100.0)
MPV: 10.2 fL (ref 7.5–12.5)
Monocytes Relative: 8.5 %
Neutro Abs: 6623 cells/uL (ref 1500–7800)
Neutrophils Relative %: 61.9 %
Platelets: 379 10*3/uL (ref 140–400)
RBC: 5.8 10*6/uL (ref 4.20–5.80)
RDW: 15 % (ref 11.0–15.0)
Total Lymphocyte: 27.3 %
WBC: 10.7 10*3/uL (ref 3.8–10.8)

## 2020-12-12 LAB — COMPLETE METABOLIC PANEL WITH GFR
AG Ratio: 1.6 (calc) (ref 1.0–2.5)
ALT: 25 U/L (ref 9–46)
AST: 17 U/L (ref 10–40)
Albumin: 4.5 g/dL (ref 3.6–5.1)
Alkaline phosphatase (APISO): 133 U/L — ABNORMAL HIGH (ref 36–130)
BUN: 17 mg/dL (ref 7–25)
CO2: 24 mmol/L (ref 20–32)
Calcium: 9.6 mg/dL (ref 8.6–10.3)
Chloride: 102 mmol/L (ref 98–110)
Creat: 0.84 mg/dL (ref 0.60–1.35)
GFR, Est African American: 136 mL/min/{1.73_m2} (ref >=60)
GFR, Est Non African American: 117 mL/min/{1.73_m2} (ref >=60)
Globulin: 2.8 g/dL (calc) (ref 1.9–3.7)
Glucose, Bld: 87 mg/dL (ref 65–99)
Potassium: 4 mmol/L (ref 3.5–5.3)
Sodium: 140 mmol/L (ref 135–146)
Total Bilirubin: 0.5 mg/dL (ref 0.2–1.2)
Total Protein: 7.3 g/dL (ref 6.1–8.1)

## 2020-12-12 LAB — HIV-1 RNA QUANT-NO REFLEX-BLD
HIV 1 RNA Quant: NOT DETECTED Copies/mL
HIV-1 RNA Quant, Log: NOT DETECTED Log cps/mL

## 2020-12-12 LAB — RPR: RPR Ser Ql: NONREACTIVE

## 2020-12-12 NOTE — Telephone Encounter (Signed)
Patient is aware and medication should be mailed to patient on 4/1 [place]

## 2020-12-12 NOTE — Telephone Encounter (Signed)
OK perfect 

## 2021-01-02 ENCOUNTER — Other Ambulatory Visit: Payer: Self-pay | Admitting: Infectious Disease

## 2021-01-02 NOTE — Telephone Encounter (Signed)
Refused refill request with message that this is a taper to discontinue steroids. Landis Gandy, RN

## 2021-01-02 NOTE — Telephone Encounter (Signed)
Supposed to have a very clearly outlined taper there should not be further refills to my knowledge.  Need be can come on Monday and bring his medications to me and we can go over them again

## 2021-01-02 NOTE — Telephone Encounter (Signed)
Rcvd electronic request for refill on Prednisone. Confirming if patient should stop taper or continue and refill. Please advise.

## 2021-02-26 ENCOUNTER — Other Ambulatory Visit: Payer: Self-pay | Admitting: Infectious Diseases

## 2021-02-26 DIAGNOSIS — B20 Human immunodeficiency virus [HIV] disease: Secondary | ICD-10-CM

## 2021-03-12 ENCOUNTER — Other Ambulatory Visit: Payer: Self-pay | Admitting: Family

## 2021-03-12 DIAGNOSIS — B20 Human immunodeficiency virus [HIV] disease: Secondary | ICD-10-CM

## 2021-03-27 ENCOUNTER — Ambulatory Visit (INDEPENDENT_AMBULATORY_CARE_PROVIDER_SITE_OTHER): Payer: Self-pay | Admitting: Infectious Disease

## 2021-03-27 ENCOUNTER — Ambulatory Visit: Payer: Self-pay

## 2021-03-27 ENCOUNTER — Encounter: Payer: Self-pay | Admitting: Infectious Disease

## 2021-03-27 ENCOUNTER — Ambulatory Visit (INDEPENDENT_AMBULATORY_CARE_PROVIDER_SITE_OTHER): Payer: Self-pay

## 2021-03-27 ENCOUNTER — Other Ambulatory Visit: Payer: Self-pay

## 2021-03-27 VITALS — BP 118/83 | HR 94 | Temp 98.3°F | Wt 228.0 lb

## 2021-03-27 DIAGNOSIS — R531 Weakness: Secondary | ICD-10-CM

## 2021-03-27 DIAGNOSIS — B20 Human immunodeficiency virus [HIV] disease: Secondary | ICD-10-CM

## 2021-03-27 DIAGNOSIS — H538 Other visual disturbances: Secondary | ICD-10-CM

## 2021-03-27 DIAGNOSIS — G939 Disorder of brain, unspecified: Secondary | ICD-10-CM

## 2021-03-27 DIAGNOSIS — R635 Abnormal weight gain: Secondary | ICD-10-CM

## 2021-03-27 DIAGNOSIS — R9082 White matter disease, unspecified: Secondary | ICD-10-CM

## 2021-03-27 DIAGNOSIS — R270 Ataxia, unspecified: Secondary | ICD-10-CM

## 2021-03-27 DIAGNOSIS — B582 Toxoplasma meningoencephalitis: Secondary | ICD-10-CM

## 2021-03-27 DIAGNOSIS — Z23 Encounter for immunization: Secondary | ICD-10-CM

## 2021-03-27 DIAGNOSIS — D893 Immune reconstitution syndrome: Secondary | ICD-10-CM

## 2021-03-27 HISTORY — DX: Abnormal weight gain: R63.5

## 2021-03-27 MED ORDER — OMEPRAZOLE 20 MG PO CPDR: 20.0000 mg | DELAYED_RELEASE_CAPSULE | Freq: Every day | ORAL | 11 refills | Status: DC

## 2021-03-27 MED ORDER — BIKTARVY 50-200-25 MG PO TABS: 1.0000 | ORAL_TABLET | Freq: Every day | ORAL | 11 refills | Status: DC

## 2021-03-27 NOTE — Progress Notes (Signed)
   Covid-19 Vaccination Clinic  Name:  Gurinder Toral    MRN: [id number] DOB: 28-Oct-1990  03/27/2021  Mr. Leovanni Bjorkman was observed post Covid-19 immunization for 15 minutes without incident. He was provided with Vaccine Information Sheet and instruction to access the V-Safe system.   Mr. Merrell Borsuk was instructed to call 911 with any severe reactions post vaccine: Difficulty breathing  Swelling of face and throat  A fast heartbeat  A bad rash all over body  Dizziness and weakness   Immunizations Administered     Name Date Dose VIS Date Route   PFIZER Comrnaty(Gray TOP) Covid-19 Vaccine 03/27/2021 10:40 AM 0.3 mL 08/22/2020 Intramuscular   Manufacturer: [place]   Lot: [id number]   NDC: [place] Yenty Bloch, CMA

## 2021-03-27 NOTE — Progress Notes (Signed)
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   MRN: [id number]  [place] is a 30 y.o. 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   1.  White matter changes on MRI:   Differential included JC virus related PML though with successful viral suppression systemically seems unlikely   JC virus was negative for the second time   IRIS to JC? Possible though would have shown iris much earlier in his course.  He was diagnosed in 2019.  In December of that year he had a viral load of 669,000 but within a month of antegrade strand transfer inhibitor therapy his viral load was down to 199 and CD4 did come up from 10 to 90.  His virus was imperfectly suppressed and CD4 count did take a while to come up but is now been above 200 since January in fact it now nearing 500   CD8 mediated encephalopathy possible   Pathology did not show malignant cells flow cytology is pending   CMV seemed  unlikely, I had SOME CMV in blood but I doubt this is a CMV CNS infection   CSF has been taken as a 3 white cells seen.   CSF  sent to [facility] for HIV RNA since HIV viral escape was  also a leading possible cause here   We gave him a trial of corticosteroids in the hospital with high-dose Solu-Medrol 500 mg every 12 hours x5 days without any improvement in his ataxia.  He was seen by [name] with ophthalmology who found no evidence of opportunistic infection in the eyes.  [name] did have some concerns  that the patient might have some glaucoma in one eye and would like to see him in the clinic.  We  did intensify his HIV medication regimen by adding Pifeltro to his Biktarvy.  CSF from his lumbar puncture was sent to [facility] for AR and viral load came back at 50 copies.  This does not seem like viral escape to me and seems to me the reason we should not need to have his regimen intensified.  Because he did not improve on corticosteroids they were stopped and I stopped all of his antitoxoplasmosis drugs as he is had successful immune reconstitution for more than 6 months and has a normal CD4 count numerically.  He had since seen [name], MD a Neurologist at [facility] with particular expertise in Neuro HIV in    Patient was referred to neuro-ophthalmology and was seen by Dr [name] who has recommended patient wearing patch or glasses to vision in one eye as this is the only maneuver that seems to correct the patient's problems with persistent diplopia.  He has not been seen by [facility] since August but was supposed to follow-up with Dr. [name].  I spoke with 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  While she and I both agree that it was likely too late for an intervention such  as corticosteroids to make a difference in the fact that the patient did not improve clinically while an inpatient on high-dose Solu-Medrol both from a clinical standpoint and radiographic standpoint argued against this.  However she felt that still be worthwhile giving him another trial of corticosteroids and we arrange for him to be seen in short stay and receive 1 g of Solu-Medrol daily for 3 days l followed by a prednisone taper starting at 80 mg/day.  He had not had any improvement in his ataxia since then according to his father and him at his last visit.  I was able to get him into physical therapy and they have done 1 initial assessment but are  waiting for approval..  he sister iwas concerned that the patient is been gaining weight and that the weight gain is getting in the way of him improving his functional status.   Since I saw him last he has not been able to continue with physical therapy because they started charging him too much money for what he and his family can afford.  His physical abilities neurological status has remained unchanged per father who accompanied him today to clinic.       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    Social History   Socioeconomic History   Marital status: Single    Spouse name: Not on file   Number of children: Not on file   Years of education: Not on file   Highest education level: Not on file  Occupational History   Not on file  Tobacco Use   Smoking status: Never   Smokeless tobacco: Never  Vaping Use   Vaping Use: Never used  Substance and Sexual Activity   Alcohol use: Not Currently    Alcohol/week: 6.0 standard drinks    Types: 6 Cans of beer per week    Comment: 08/10/2018 "drink only on weekend"   Drug use: Not Currently    Comment: 08/10/2018 "have tried coke; been a long time since I've usd any"   Sexual activity: Not Currently    Comment: declined condoms  Other Topics Concern   Not on file  Social History Narrative   Not on file   Social Determinants of Health   Financial Resource Strain: Not on file   Food Insecurity: Not on file  Transportation Needs: Not on file  Physical Activity: Not on file  Stress: Not on file  Social Connections: Not on file    Allergies  Allergen Reactions   Sulfa Antibiotics Rash     Current Outpatient Medications:    acetaminophen (TYLENOL) 325 MG tablet, Take 2 tablets (650 mg total) by mouth every 6 (six) hours as needed for mild pain (or Fever >/= 101)., Disp: , Rfl:    BIKTARVY 50-200-25 MG TABS tablet, TAKE 1 TABLET BY MOUTH DAILY, Disp: 30 tablet, Rfl: 5   omeprazole (PRILOSEC) 20 MG  capsule, Take 1 capsule (20 mg total) by mouth daily., Disp: 30 capsule, Rfl: 11   ondansetron (ZOFRAN) 4 MG tablet, TAKE 1 TABLET BY MOUTH EVERY 8 HOURS AS NEEDED FOR NAUSEA AND VOMITING (Patient not taking: Reported on 12/10/2020), Disp: 90 tablet, Rfl: 3    Review of Systems  Unable to perform ROS: Dementia      Objective:   Physical Exam Constitutional:      General: He is not in acute distress.    Appearance: Normal appearance. He is well-developed. He is obese. He is not ill-appearing or diaphoretic.  HENT:     Head: Normocephalic and atraumatic.     Right Ear: Hearing and external ear normal.     Left Ear: Hearing and external ear normal.     Nose: No nasal deformity or rhinorrhea.  Eyes:     General: No scleral icterus.    Extraocular Movements: Extraocular movements intact.     Conjunctiva/sclera: Conjunctivae normal.     Right eye: Right conjunctiva is not injected.     Left eye: Left conjunctiva is not injected.     Pupils: Pupils are equal, round, and reactive to light.  Neck:     Vascular: No JVD.  Cardiovascular:     Rate and Rhythm: Normal rate and regular rhythm.     Heart sounds: S1 normal and S2 normal.  Abdominal:     General: Bowel sounds are normal. There is no distension.     Palpations: Abdomen is soft.     Tenderness: There is no abdominal tenderness.  Musculoskeletal:        General: Normal range of motion.     Right  shoulder: Normal.     Left shoulder: Normal.     Cervical back: Normal range of motion and neck supple.     Right hip: Normal.     Left hip: Normal.     Right knee: Normal.     Left knee: Normal.  Lymphadenopathy:     Head:     Right side of head: No submandibular, preauricular or posterior auricular adenopathy.     Left side of head: No submandibular, preauricular or posterior auricular adenopathy.     Cervical: No cervical adenopathy.     Right cervical: No superficial or deep cervical adenopathy.    Left cervical: No superficial or deep cervical adenopathy.  Skin:    General: Skin is warm and dry.     Coloration: Skin is not pale.     Findings: No abrasion, bruising, ecchymosis, erythema, lesion or rash.     Nails: There is no clubbing.  Neurological:     Mental Status: He is alert.     Sensory: No sensory deficit.     Coordination: Coordination normal.     Gait: Gait abnormal.  Psychiatric:        Attention and Perception: He is attentive.        Mood and Affect: Mood normal.        Speech: Speech is delayed.        Behavior: Behavior is slowed. Behavior is cooperative.        Thought Content: Thought content normal.        Cognition and Memory: Memory is impaired.        Judgment: Judgment normal.     Comments: Father again did most of the talking.     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  Really not sure what the cause of this is.  It is clearly not PML related to [place] virus with JC virus not having been detected on 2 different attempts and with his immune system being normal numerically for more than 1/2-year.  HIV escape seems unlikely and a viral load of 50 in the CNS does not seem to be to be terribly significant.  CD8 mediated cephalopathy was on it was considered well but this was not found on the cytometry.  He did not have any evidence of an opportunistic infection in the CNS or eye when exam by ophthalmology.  I  discontinued  his Pifeltro as I do not see an indication nor does [name] for intensification as there is no real evidence of CSF escape requiring another antiviral  I wondered  very much if he had an IRIS flare past Spring that is now over and not change-  We have tried corticosteroids and they have made no impact he is off them.  He is going to see [name] in August.  Will be nice if we could have him seen by physical therapy and I will try to investigate further there are any local options he may benefit from going to [facility] though certainly cost of gasoline and [place] not be inconsequential.   Weight gain: He gained considerable weight beyond return to health on his regimen of BIKTARVY.  Also gained further weight on corticosteroids. Might be a candidate for DO- IT ACTG study though he himself has cognitive deficits and I do not think he could give informed consent  Toxoplasmosis I have stopped his antitoxoplasma drugs as he has had more than 6 months of successful immune reconstitution.  HIV disease: Continue Biktarvy for now but consider ACTG study  COVID prevention for shot of [place] today.  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

## 2021-03-28 ENCOUNTER — Encounter: Payer: Self-pay | Admitting: Infectious Disease

## 2021-03-28 LAB — URINE CYTOLOGY ANCILLARY ONLY
Chlamydia: NEGATIVE
Comment: NEGATIVE
Comment: NORMAL
Neisseria Gonorrhea: NEGATIVE

## 2021-03-28 LAB — T-HELPER CELL (CD4) - (RCID CLINIC ONLY)
CD4 % Helper T Cell: 19 % — ABNORMAL LOW (ref 33–65)
CD4 T Cell Abs: 374 /uL — ABNORMAL LOW (ref 400–1790)

## 2021-03-31 LAB — LIPID PANEL
Cholesterol: 159 mg/dL (ref <200)
HDL: 24 mg/dL — ABNORMAL LOW (ref >=40)
Non-HDL Cholesterol (Calc): 135 mg/dL (calc) — ABNORMAL HIGH (ref <130)
Total CHOL/HDL Ratio: 6.6 (calc) — ABNORMAL HIGH (ref <5.0)
Triglycerides: 602 mg/dL — ABNORMAL HIGH (ref <150)

## 2021-03-31 LAB — COMPLETE METABOLIC PANEL WITH GFR
AG Ratio: 1.3 (calc) (ref 1.0–2.5)
ALT: 32 U/L (ref 9–46)
AST: 29 U/L (ref 10–40)
Albumin: 4.4 g/dL (ref 3.6–5.1)
Alkaline phosphatase (APISO): 137 U/L — ABNORMAL HIGH (ref 36–130)
BUN: 13 mg/dL (ref 7–25)
CO2: 26 mmol/L (ref 20–32)
Calcium: 9.7 mg/dL (ref 8.6–10.3)
Chloride: 103 mmol/L (ref 98–110)
Creat: 0.89 mg/dL (ref 0.60–1.26)
Globulin: 3.3 g/dL (calc) (ref 1.9–3.7)
Glucose, Bld: 94 mg/dL (ref 65–99)
Potassium: 4.4 mmol/L (ref 3.5–5.3)
Sodium: 137 mmol/L (ref 135–146)
Total Bilirubin: 0.5 mg/dL (ref 0.2–1.2)
Total Protein: 7.7 g/dL (ref 6.1–8.1)
eGFR: 117 mL/min/{1.73_m2} (ref >=60)

## 2021-03-31 LAB — CBC WITH DIFFERENTIAL/PLATELET
Absolute Monocytes: 731 cells/uL (ref 200–950)
Basophils Absolute: 59 cells/uL (ref 0–200)
Basophils Relative: 0.7 %
Eosinophils Absolute: 395 cells/uL (ref 15–500)
Eosinophils Relative: 4.7 %
HCT: 51.1 % — ABNORMAL HIGH (ref 38.5–50.0)
Hemoglobin: 16.8 g/dL (ref 13.2–17.1)
Lymphs Abs: 2167 cells/uL (ref 850–3900)
MCH: 27.2 pg (ref 27.0–33.0)
MCHC: 32.9 g/dL (ref 32.0–36.0)
MCV: 82.7 fL (ref 80.0–100.0)
MPV: 10 fL (ref 7.5–12.5)
Monocytes Relative: 8.7 %
Neutro Abs: 5048 cells/uL (ref 1500–7800)
Neutrophils Relative %: 60.1 %
Platelets: 419 10*3/uL — ABNORMAL HIGH (ref 140–400)
RBC: 6.18 10*6/uL — ABNORMAL HIGH (ref 4.20–5.80)
RDW: 12.9 % (ref 11.0–15.0)
Total Lymphocyte: 25.8 %
WBC: 8.4 10*3/uL (ref 3.8–10.8)

## 2021-03-31 LAB — HIV-1 RNA QUANT-NO REFLEX-BLD
HIV 1 RNA Quant: 23 Copies/mL — ABNORMAL HIGH
HIV-1 RNA Quant, Log: 1.37 Log cps/mL — ABNORMAL HIGH

## 2021-03-31 LAB — RPR: RPR Ser Ql: NONREACTIVE

## 2021-04-18 LAB — MISCELLANEOUS TEST

## 2021-07-22 ENCOUNTER — Ambulatory Visit: Payer: Self-pay | Admitting: Infectious Disease

## 2021-07-25 ENCOUNTER — Encounter: Payer: Self-pay | Admitting: Infectious Disease

## 2021-07-25 ENCOUNTER — Other Ambulatory Visit: Payer: Self-pay

## 2021-07-25 ENCOUNTER — Ambulatory Visit (INDEPENDENT_AMBULATORY_CARE_PROVIDER_SITE_OTHER): Payer: Self-pay

## 2021-07-25 ENCOUNTER — Ambulatory Visit (INDEPENDENT_AMBULATORY_CARE_PROVIDER_SITE_OTHER): Payer: Self-pay | Admitting: Infectious Disease

## 2021-07-25 VITALS — BP 121/93 | HR 88 | Temp 98.4°F | Wt 224.0 lb

## 2021-07-25 DIAGNOSIS — R270 Ataxia, unspecified: Secondary | ICD-10-CM

## 2021-07-25 DIAGNOSIS — R269 Unspecified abnormalities of gait and mobility: Secondary | ICD-10-CM

## 2021-07-25 DIAGNOSIS — B20 Human immunodeficiency virus [HIV] disease: Secondary | ICD-10-CM

## 2021-07-25 DIAGNOSIS — D751 Secondary polycythemia: Secondary | ICD-10-CM

## 2021-07-25 DIAGNOSIS — Z23 Encounter for immunization: Secondary | ICD-10-CM

## 2021-07-25 DIAGNOSIS — R9082 White matter disease, unspecified: Secondary | ICD-10-CM

## 2021-07-25 DIAGNOSIS — B582 Toxoplasma meningoencephalitis: Secondary | ICD-10-CM

## 2021-07-25 DIAGNOSIS — Z113 Encounter for screening for infections with a predominantly sexual mode of transmission: Secondary | ICD-10-CM

## 2021-07-25 DIAGNOSIS — R635 Abnormal weight gain: Secondary | ICD-10-CM

## 2021-07-25 DIAGNOSIS — R400 Somnolence: Secondary | ICD-10-CM | POA: Insufficient documentation

## 2021-07-25 DIAGNOSIS — D893 Immune reconstitution syndrome: Secondary | ICD-10-CM

## 2021-07-25 HISTORY — DX: Somnolence: R40.0

## 2021-07-25 HISTORY — DX: Secondary polycythemia: D75.1

## 2021-07-25 NOTE — Addendum Note (Signed)
Addended by: Leatrice Jewels on: 07/25/2021 10:29 AM   Modules accepted: Orders

## 2021-07-25 NOTE — Progress Notes (Signed)
   Covid-19 Vaccination Clinic  Name:  Thomas Park    MRN: [id number] DOB: 08/26/1991  07/25/2021  Mr. Thomas Park was observed post Covid-19 immunization for 15 minutes without incident. He was provided with Vaccine Information Sheet and instruction to access the V-Safe system.   Mr. Thomas Park was instructed to call 911 with any severe reactions post vaccine: Difficulty breathing  Swelling of face and throat  A fast heartbeat  A bad rash all over body  Dizziness and weakness   Immunizations Administered     Name Date Dose VIS Date Route   Pfizer Covid-19 Vaccine Bivalent Booster 07/25/2021 11:20 AM 0.3 mL 05/14/2021 Intramuscular   Manufacturer: [place]   Lot: [id number]   [place]: Thomas Park

## 2021-07-25 NOTE — Progress Notes (Signed)
Subjective:   Chief complaint: Follow-up for HIV disease on medications.   Patient ID: Thomas Park, male    DOB: Oct 11, 1990, 30 y.o.   MRN: [id number]  Thomas Park is a 30 y.o. 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   1.  White matter changes on MRI:   Differential included JC virus related PML though with successful viral suppression systemically seems unlikely   JC virus was negative for the second time   IRIS to JC? Possible though would have shown iris much earlier in his course.  He was diagnosed in 2019.  In December of that year he had a viral load of 669,000 but within a month of antegrade strand transfer inhibitor therapy his viral load was down to 199 and CD4 did come up from 10 to 90.  His virus was imperfectly suppressed and CD4 count did take a while to come up but is now been above 200 since January in fact it now nearing 500   CD8 mediated encephalopathy possible   Pathology did not show malignant cells flow cytology is pending   CMV seemed  unlikely, I had SOME CMV in blood but I doubt this is a CMV CNS infection   CSF has been taken as a 3 white cells seen.   CSF  sent to [facility] for HIV RNA since HIV viral escape was  also a leading possible cause here   We gave him a trial of corticosteroids in the hospital with high-dose Solu-Medrol 500 mg every 12 hours x5 days without any improvement in his ataxia.  He was seen by [name] with ophthalmology who found no evidence of opportunistic infection in the eyes.  [name] did have some concerns that the patient might have some glaucoma in one eye and would like to see him in the clinic.  We  did intensify his HIV  medication regimen by adding Pifeltro to his Biktarvy.  CSF from his lumbar puncture was sent to [facility] for AR and viral load came back at 50 copies.  This does not seem like viral escape to me and seems to me the reason we should not need to have his regimen intensified.  Because he did not improve on corticosteroids they were stopped and I stopped all of his antitoxoplasmosis drugs as he is had successful immune reconstitution for more than 6 months and has a normal CD4 count numerically.  He had since seen [name], MD a Neurologist at [facility] with particular expertise in Neuro HIV in    Patient was referred to neuro-ophthalmology and was seen by Dr [name] who has recommended patient wearing patch or glasses to vision in one eye as this is the only maneuver that seems to correct the patient's problems with persistent diplopia.  He has not been seen by [facility] since August but was supposed to follow-up with Dr. [name].  I spoke with 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  While she and I both agree that it was likely too late for an intervention such as corticosteroids to make a difference in the fact that the patient did not improve clinically while an  inpatient on high-dose Solu-Medrol both from a clinical standpoint and radiographic standpoint argued against this.  However she felt that still be worthwhile giving him another trial of corticosteroids and we arrange for him to be seen in short stay and receive 1 g of Solu-Medrol daily for 3 days l followed by a prednisone taper starting at 80 mg/day.  He had not had any improvement in his ataxia since then according to his father and him at his last visit.  I was able to get him into physical therapy and they have done 1 initial assessment but are waiting for approval..  he sister iwas concerned that the patient is been gaining weight and that the weight gain is  getting in the way of him improving his functional status.  Several visits ago he was not been able to continue with physical therapy because they started charging him too much money for what he and his family can afford.  His physical abilities neurological status has remained unchanged per father who accompanied him today to clinic at last visit.  Today comes in with his sister.  Apparently his ataxia seems somewhat improved she says since he came off the steroids.  They have not been able to see physical therapy again due to cost had an appoint with [name] at [facility] but it was canceled and I will need to have it rescheduled.  I reviewed his labs from when he was here in July and I am happy with how his HIV is controlled.  I did notice that his hemoglobin and hematocrit have been creeping up over the last year or so and the same time.  That he has been gaining more more weight.  Questioning him and his sister apparently he does snore and apparently has always snored.  He denied waking up gasping for air at night he has been sleepy however during the day.  There is no smoking going on or previously.         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    Social History   Socioeconomic History   Marital status: Single    Spouse name: Not on file   Number of children: Not on file   Years of education: Not on  file   Highest education level: Not on file  Occupational History   Not on file  Tobacco Use   Smoking status: Never   Smokeless tobacco: Never  Vaping Use   Vaping Use: Never used  Substance and Sexual Activity   Alcohol use: Not Currently    Alcohol/week: 6.0 standard drinks    Types: 6 Cans of beer per week    Comment: 08/10/2018 "drink only on weekend"   Drug use: Not Currently    Comment: 08/10/2018 "have tried coke; been a long time since I've usd any"   Sexual activity: Not Currently  Comment: declined condoms  Other Topics Concern   Not on file  Social History Narrative   Not on file   Social Determinants of Health   Financial Resource Strain: Not on file  Food Insecurity: Not on file  Transportation Needs: Not on file  Physical Activity: Not on file  Stress: Not on file  Social Connections: Not on file    Allergies  Allergen Reactions   Sulfa Antibiotics Rash     Current Outpatient Medications:    acetaminophen (TYLENOL) 325 MG tablet, Take 2 tablets (650 mg total) by mouth every 6 (six) hours as needed for mild pain (or Fever >/= 101)., Disp: , Rfl:    bictegravir-emtricitabine-tenofovir AF (BIKTARVY) 50-200-25 MG TABS tablet, Take 1 tablet by mouth daily., Disp: 30 tablet, Rfl: 11   omeprazole (PRILOSEC) 20 MG capsule, Take 1 capsule (20 mg total) by mouth daily., Disp: 30 capsule, Rfl: 11    Review of Systems  Unable to perform ROS: Dementia      Objective:   Physical Exam Constitutional:      Appearance: He is well-developed. He is obese.  HENT:     Head: Normocephalic and atraumatic.  Eyes:     Conjunctiva/sclera: Conjunctivae normal.  Cardiovascular:     Rate and Rhythm: Normal rate and regular rhythm.  Pulmonary:     Effort: Pulmonary effort is normal. No respiratory distress.     Breath sounds: No wheezing.  Abdominal:     General: There is no distension.     Palpations: Abdomen is soft.  Musculoskeletal:        General: No  tenderness. Normal range of motion.     Cervical back: Normal range of motion and neck supple.  Skin:    General: Skin is warm and dry.     Coloration: Skin is not pale.     Findings: No erythema or rash.  Neurological:     Mental Status: He is alert.     Comments: Still has some leftward deviation of his gaze in the left eye in particular his left eye tends to deviate outwards at times.  Psychiatric:        Attention and Perception: Attention normal.        Speech: Speech is delayed.        Behavior: Behavior normal.     Comments: Sister answers most of the questions re how he is doing     Assessment & Plan:   Ataxia:   His sister asked about repeat MRI but I think since there has been stability to improvement in his neurological symptoms it would not be worth spending the money but he would have to pay out-of-pocket with his lack of insurance for an MRI of the brain.  Like him to follow-up with [name] at [facility] if that is still something they are capable of doing.   HIV disease: will continue Biktarvy. Could consider switch to NON INSTI regimen and he would be candidate for ACTG study DO- IT ACTG study though he himself has cognitive deficits and I do not think he could give informed consent  Checking a viral load CD4 count CBC CMP today.  Weight gain as described above consider change away from integrase strand transfer and observe based regimen but I would like to keep him on this for now he is lost 5 pounds according t supplies [place]: This is successfully been treated o the sister recently.  Toxoplasmosis I have stopped his antitoxoplasma drugs as  he has had more than 6 months of successful immune reconstitution.  Diplopia: stable  Polycythemia: I reviewed his hemoglobin trend and has been going up with last year him concerned that with his weight gain and large neck size and history of snoring that he may have sleep apnea as the underlying cause.  I am referring him for a  sleep study.

## 2021-07-25 NOTE — Addendum Note (Signed)
Addended by: Leatrice Jewels on: 07/25/2021 09:54 AM   Modules accepted: Orders

## 2021-07-28 LAB — COMPLETE METABOLIC PANEL WITH GFR
AG Ratio: 1.6 (calc) (ref 1.0–2.5)
ALT: 33 U/L (ref 9–46)
AST: 25 U/L (ref 10–40)
Albumin: 4.7 g/dL (ref 3.6–5.1)
Alkaline phosphatase (APISO): 156 U/L — ABNORMAL HIGH (ref 36–130)
BUN: 17 mg/dL (ref 7–25)
CO2: 23 mmol/L (ref 20–32)
Calcium: 9.6 mg/dL (ref 8.6–10.3)
Chloride: 102 mmol/L (ref 98–110)
Creat: 0.89 mg/dL (ref 0.60–1.26)
Globulin: 2.9 g/dL (calc) (ref 1.9–3.7)
Glucose, Bld: 100 mg/dL — ABNORMAL HIGH (ref 65–99)
Potassium: 4.2 mmol/L (ref 3.5–5.3)
Sodium: 136 mmol/L (ref 135–146)
Total Bilirubin: 0.7 mg/dL (ref 0.2–1.2)
Total Protein: 7.6 g/dL (ref 6.1–8.1)
eGFR: 117 mL/min/{1.73_m2} (ref >=60)

## 2021-07-28 LAB — CBC WITH DIFFERENTIAL/PLATELET
Absolute Monocytes: 747 cells/uL (ref 200–950)
Basophils Absolute: 46 cells/uL (ref 0–200)
Basophils Relative: 0.6 %
Eosinophils Absolute: 293 cells/uL (ref 15–500)
Eosinophils Relative: 3.8 %
HCT: 49.4 % (ref 38.5–50.0)
Hemoglobin: 16.6 g/dL (ref 13.2–17.1)
Lymphs Abs: 2541 cells/uL (ref 850–3900)
MCH: 27.8 pg (ref 27.0–33.0)
MCHC: 33.6 g/dL (ref 32.0–36.0)
MCV: 82.6 fL (ref 80.0–100.0)
MPV: 10.4 fL (ref 7.5–12.5)
Monocytes Relative: 9.7 %
Neutro Abs: 4073 cells/uL (ref 1500–7800)
Neutrophils Relative %: 52.9 %
Platelets: 365 10*3/uL (ref 140–400)
RBC: 5.98 10*6/uL — ABNORMAL HIGH (ref 4.20–5.80)
RDW: 13.7 % (ref 11.0–15.0)
Total Lymphocyte: 33 %
WBC: 7.7 10*3/uL (ref 3.8–10.8)

## 2021-07-28 LAB — LIPID PANEL
Cholesterol: 150 mg/dL (ref <200)
HDL: 23 mg/dL — ABNORMAL LOW (ref >=40)
Non-HDL Cholesterol (Calc): 127 mg/dL (calc) (ref <130)
Total CHOL/HDL Ratio: 6.5 (calc) — ABNORMAL HIGH (ref <5.0)
Triglycerides: 565 mg/dL — ABNORMAL HIGH (ref <150)

## 2021-07-28 LAB — URINE CYTOLOGY ANCILLARY ONLY
Chlamydia: NEGATIVE
Comment: NEGATIVE
Comment: NORMAL
Neisseria Gonorrhea: NEGATIVE

## 2021-07-28 LAB — T-HELPER CELLS (CD4) COUNT (NOT AT ARMC)
Absolute CD4: 465 cells/uL — ABNORMAL LOW (ref 490–1740)
CD4 T Helper %: 19 % — ABNORMAL LOW (ref 30–61)
Total lymphocyte count: 2431 cells/uL (ref 850–3900)

## 2021-07-28 LAB — RPR: RPR Ser Ql: NONREACTIVE

## 2021-07-28 LAB — HIV-1 RNA QUANT-NO REFLEX-BLD
HIV 1 RNA Quant: 47 Copies/mL — ABNORMAL HIGH
HIV-1 RNA Quant, Log: 1.67 Log cps/mL — ABNORMAL HIGH

## 2021-09-26 ENCOUNTER — Ambulatory Visit: Payer: Self-pay

## 2021-09-26 ENCOUNTER — Other Ambulatory Visit: Payer: Self-pay

## 2021-11-10 NOTE — Progress Notes (Signed)
Subjective:   Chief complaint: Follow-up for HIV disease on medications   Patient ID: Thomas Park, male    DOB: [birth year], 31 y.o.   MRN: [id number]  Thomas Park is a 31 y.o. 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   1.  White matter changes on MRI:   Differential included JC virus related PML though with successful viral suppression systemically seems unlikely   JC virus was negative for the second time   IRIS to JC? Possible though would have shown iris much earlier in his course.  He was diagnosed in 2019.  In December of that year he had a viral load of 669,000 but within a month of antegrade strand transfer inhibitor therapy his viral load was down to 199 and CD4 did come up from 10 to 90.  His virus was imperfectly suppressed and CD4 count did take a while to come up but is now been above 200 since January in fact it now nearing 500   CD8 mediated encephalopathy possible   Pathology did not show malignant cells flow cytology is pending   CMV seemed  unlikely, I had SOME CMV in blood but I doubt this is a CMV CNS infection   CSF has been taken as a 3 white cells seen.   CSF  sent to [facility] for HIV RNA since HIV viral escape was  also a leading possible cause here   We gave him a trial of corticosteroids in the hospital with high-dose Solu-Medrol 500 mg every 12 hours x5 days without any improvement in his ataxia.  He was seen by [name] with ophthalmology who found no evidence of opportunistic infection in the eyes.  [name] did have some concerns that the patient might have some glaucoma in one eye and would like to see him in the clinic.  We  did intensify his HIV  medication regimen by adding Pifeltro to his Biktarvy.  CSF from his lumbar puncture was sent to [facility] for AR and viral load came back at 50 copies.  This does not seem like viral escape to me and seems to me the reason we should not need to have his regimen intensified.  Because he did not improve on corticosteroids they were stopped and I stopped all of his antitoxoplasmosis drugs as he is had successful immune reconstitution for more than 6 months and has a normal CD4 count numerically.  He had since seen [name], MD a Neurologist at [facility] with particular expertise in Neuro HIV in    Patient was referred to neuro-ophthalmology and was seen by Dr [name] who has recommended patient wearing patch or glasses to vision in one eye as this is the only maneuver that seems to correct the patient's problems with persistent diplopia.  He has not been seen by [facility] since August but was supposed to follow-up with Dr. [name].  I spoke with 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  While she and I both agree that it was likely too late for an intervention such as corticosteroids to make a difference in the fact that the patient did not improve clinically while an  inpatient on high-dose Solu-Medrol both from a clinical standpoint and radiographic standpoint argued against this.  However she felt that still be worthwhile giving him another trial of corticosteroids and we arrange for him to be seen in short stay and receive 1 g of Solu-Medrol daily for 3 days l followed by a prednisone taper starting at 80 mg/day.  He had not had any improvement in his ataxia since then according to his father and him at his last visit.  I was able to get him into physical therapy and they have done 1 initial assessment but are waiting for approval..  he sister iwas concerned that the patient is been gaining weight and that the weight gain is  getting in the way of him improving his functional status.  Several visits ago he was not been able to continue with physical therapy because they started charging him too much money for what he and his family can afford.  His physical abilities neurological status has remained unchanged per father who accompanied him today to clinic at last visit.  When he came in with his sister  his ataxia seems somewhat improved They hadnot been able to see physical therapy again due to cost had an appoint with [name] at [facility] but it was canceled and I will need to have it rescheduled.   I had notice that his hemoglobin and hematocrit have been creeping up over the last year or so and the same time.  That he has been gaining more more weight.  Questioning him and his sister apparently he does snore and apparently has always snored.  He denied waking up gasping for air at night he has been sleepy however during the day.  There is no smoking going on or previously.  I referred him for sleep study but I think this has been done.  Today he was brought by his father but father was not in the room with him.  Spanish interpreter was present.  The patient says he has lost some weight recently due to increased exercising.  He denies any worsening ataxia and has no complaints today.           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    Social History   Socioeconomic History   Marital status: Single    Spouse name: Not on file   Number of children: Not on file   Years of education: Not on file   Highest education level: Not on file  Occupational History   Not on file  Tobacco Use   Smoking status: Never   Smokeless tobacco: Never  Vaping Use   Vaping Use: Never used  Substance and Sexual Activity   Alcohol use: Not Currently    Alcohol/week: 6.0 standard drinks    Types: 6 Cans of beer per  week    Comment: 08/10/2018 "drink only on weekend"   Drug use: Not Currently    Comment: 08/10/2018 "have tried coke; been a long time since I've usd any"   Sexual activity: Not Currently    Comment: declined condoms  Other Topics Concern   Not on file  Social History Narrative   Not on file   Social Determinants of Health   Financial Resource Strain: Not on file  Food Insecurity: Not on file  Transportation Needs: Not on file  Physical Activity: Not on file  Stress: Not on file  Social Connections: Not on file    Allergies  Allergen Reactions   Sulfa Antibiotics Rash     Current Outpatient Medications:    acetaminophen (TYLENOL) 325 MG tablet, Take 2 tablets (650 mg total) by mouth every 6 (six) hours as needed for mild pain (or Fever >/= 101)., Disp: , Rfl:    bictegravir-emtricitabine-tenofovir AF (BIKTARVY) 50-200-25 MG TABS tablet, Take 1 tablet by mouth daily., Disp: 30 tablet, Rfl: 11   omeprazole (PRILOSEC) 20 MG capsule, Take 1 capsule (20 mg total) by mouth daily., Disp: 30 capsule, Rfl: 11    Review of Systems  Unable to perform ROS: Dementia      Objective:   Physical Exam Constitutional:      Appearance: He is well-developed. He is obese.  HENT:     Head: Normocephalic and atraumatic.  Eyes:     Conjunctiva/sclera: Conjunctivae normal.  Cardiovascular:     Rate and Rhythm: Normal rate and regular rhythm.  Pulmonary:     Effort:  Pulmonary effort is normal. No respiratory distress.     Breath sounds: No wheezing.  Abdominal:     General: There is no distension.     Palpations: Abdomen is soft.  Musculoskeletal:        General: No tenderness. Normal range of motion.     Cervical back: Normal range of motion and neck supple.  Skin:    General: Skin is warm and dry.     Coloration: Skin is not pale.     Findings: No erythema or rash.  Neurological:     General: No focal deficit present.     Mental Status: He is alert and oriented to person, place, and time.  Psychiatric:        Mood and Affect: Mood normal.        Behavior: Behavior normal.        Thought Content: Thought content normal.        Cognition and Memory: He exhibits impaired remote memory.        Judgment: Judgment normal.     Assessment & Plan:  Ataxia: Seems relatively stable  I would like it if he could go back and see [name] but this does not seem to have happened recently.  White matter changes on MRI: See extensive discussion as documented prior notes   HIV disease: I am checking a viral load CD4 CMP CBC with differential I will continue his Biktarvy prescription  Weight gain: Seems stable at this point in time consider weight loss intervention such as diet and carbohydrate restriction  Toxoplasmosis cured  Diploplia: Chronic stable  Polycythemia: Rechecking CBC with differential today and wanted to get sleep study.

## 2021-11-11 ENCOUNTER — Other Ambulatory Visit: Payer: Self-pay

## 2021-11-11 ENCOUNTER — Ambulatory Visit (INDEPENDENT_AMBULATORY_CARE_PROVIDER_SITE_OTHER): Payer: Self-pay | Admitting: Infectious Disease

## 2021-11-11 DIAGNOSIS — B582 Toxoplasma meningoencephalitis: Secondary | ICD-10-CM

## 2021-11-11 DIAGNOSIS — H538 Other visual disturbances: Secondary | ICD-10-CM

## 2021-11-11 DIAGNOSIS — D751 Secondary polycythemia: Secondary | ICD-10-CM

## 2021-11-11 DIAGNOSIS — G939 Disorder of brain, unspecified: Secondary | ICD-10-CM

## 2021-11-11 DIAGNOSIS — Z23 Encounter for immunization: Secondary | ICD-10-CM

## 2021-11-11 DIAGNOSIS — R9082 White matter disease, unspecified: Secondary | ICD-10-CM

## 2021-11-11 DIAGNOSIS — B20 Human immunodeficiency virus [HIV] disease: Secondary | ICD-10-CM

## 2021-11-11 DIAGNOSIS — R635 Abnormal weight gain: Secondary | ICD-10-CM

## 2021-11-11 DIAGNOSIS — R269 Unspecified abnormalities of gait and mobility: Secondary | ICD-10-CM

## 2021-11-11 MED ORDER — OMEPRAZOLE 20 MG PO CPDR: 20.0000 mg | DELAYED_RELEASE_CAPSULE | Freq: Every day | ORAL | 11 refills | Status: DC

## 2021-11-11 MED ORDER — BIKTARVY 50-200-25 MG PO TABS: 1.0000 | ORAL_TABLET | Freq: Every day | ORAL | 11 refills | Status: DC

## 2021-11-11 NOTE — Addendum Note (Signed)
Addended by: Leatrice Jewels on: 11/11/2021 11:23 AM   Modules accepted: Orders

## 2021-11-13 LAB — CBC WITH DIFFERENTIAL/PLATELET
Absolute Monocytes: 540 cells/uL (ref 200–950)
Basophils Absolute: 58 cells/uL (ref 0–200)
Basophils Relative: 0.8 %
Eosinophils Absolute: 248 cells/uL (ref 15–500)
Eosinophils Relative: 3.4 %
HCT: 51.3 % — ABNORMAL HIGH (ref 38.5–50.0)
Hemoglobin: 16.8 g/dL (ref 13.2–17.1)
Lymphs Abs: 2256 cells/uL (ref 850–3900)
MCH: 27.8 pg (ref 27.0–33.0)
MCHC: 32.7 g/dL (ref 32.0–36.0)
MCV: 84.8 fL (ref 80.0–100.0)
MPV: 10.5 fL (ref 7.5–12.5)
Monocytes Relative: 7.4 %
Neutro Abs: 4198 cells/uL (ref 1500–7800)
Neutrophils Relative %: 57.5 %
Platelets: 384 10*3/uL (ref 140–400)
RBC: 6.05 10*6/uL — ABNORMAL HIGH (ref 4.20–5.80)
RDW: 13.7 % (ref 11.0–15.0)
Total Lymphocyte: 30.9 %
WBC: 7.3 10*3/uL (ref 3.8–10.8)

## 2021-11-13 LAB — COMPLETE METABOLIC PANEL WITH GFR
AG Ratio: 1.4 (calc) (ref 1.0–2.5)
ALT: 25 U/L (ref 9–46)
AST: 23 U/L (ref 10–40)
Albumin: 4.5 g/dL (ref 3.6–5.1)
Alkaline phosphatase (APISO): 138 U/L — ABNORMAL HIGH (ref 36–130)
BUN: 15 mg/dL (ref 7–25)
CO2: 24 mmol/L (ref 20–32)
Calcium: 9.7 mg/dL (ref 8.6–10.3)
Chloride: 105 mmol/L (ref 98–110)
Creat: 0.84 mg/dL (ref 0.60–1.26)
Globulin: 3.3 g/dL (calc) (ref 1.9–3.7)
Glucose, Bld: 105 mg/dL — ABNORMAL HIGH (ref 65–99)
Potassium: 4.1 mmol/L (ref 3.5–5.3)
Sodium: 138 mmol/L (ref 135–146)
Total Bilirubin: 0.6 mg/dL (ref 0.2–1.2)
Total Protein: 7.8 g/dL (ref 6.1–8.1)
eGFR: 120 mL/min/{1.73_m2} (ref >=60)

## 2021-11-13 LAB — T-HELPER CELLS (CD4) COUNT (NOT AT ARMC)
Absolute CD4: 434 cells/uL — ABNORMAL LOW (ref 490–1740)
CD4 T Helper %: 19 % — ABNORMAL LOW (ref 30–61)
Total lymphocyte count: 2248 cells/uL (ref 850–3900)

## 2021-11-13 LAB — HIV-1 RNA QUANT-NO REFLEX-BLD
HIV 1 RNA Quant: <20 Copies/mL — ABNORMAL HIGH
HIV-1 RNA Quant, Log: <1.3 Log cps/mL — ABNORMAL HIGH

## 2021-11-13 LAB — LIPID PANEL
Cholesterol: 152 mg/dL (ref <200)
HDL: 23 mg/dL — ABNORMAL LOW (ref >=40)
Non-HDL Cholesterol (Calc): 129 mg/dL (calc) (ref <130)
Total CHOL/HDL Ratio: 6.6 (calc) — ABNORMAL HIGH (ref <5.0)
Triglycerides: 486 mg/dL — ABNORMAL HIGH (ref <150)

## 2021-11-13 LAB — C. TRACHOMATIS/N. GONORRHOEAE RNA
C. trachomatis RNA, TMA: NOT DETECTED
N. gonorrhoeae RNA, TMA: NOT DETECTED

## 2021-11-13 LAB — RPR: RPR Ser Ql: NONREACTIVE

## 2022-04-07 ENCOUNTER — Encounter: Payer: Self-pay | Admitting: Infectious Disease

## 2022-04-07 ENCOUNTER — Ambulatory Visit (INDEPENDENT_AMBULATORY_CARE_PROVIDER_SITE_OTHER): Payer: Self-pay | Admitting: Infectious Disease

## 2022-04-07 ENCOUNTER — Ambulatory Visit: Payer: Self-pay

## 2022-04-07 ENCOUNTER — Other Ambulatory Visit: Payer: Self-pay

## 2022-04-07 VITALS — BP 125/85 | HR 109 | Temp 98.6°F | Wt 217.0 lb

## 2022-04-07 DIAGNOSIS — B582 Toxoplasma meningoencephalitis: Secondary | ICD-10-CM

## 2022-04-07 DIAGNOSIS — R269 Unspecified abnormalities of gait and mobility: Secondary | ICD-10-CM

## 2022-04-07 DIAGNOSIS — D751 Secondary polycythemia: Secondary | ICD-10-CM

## 2022-04-07 DIAGNOSIS — B20 Human immunodeficiency virus [HIV] disease: Secondary | ICD-10-CM

## 2022-04-07 DIAGNOSIS — D893 Immune reconstitution syndrome: Secondary | ICD-10-CM

## 2022-04-07 MED ORDER — OMEPRAZOLE 20 MG PO CPDR: 20.0000 mg | DELAYED_RELEASE_CAPSULE | Freq: Every day | ORAL | 11 refills | Status: DC

## 2022-04-07 MED ORDER — BIKTARVY 50-200-25 MG PO TABS: 1.0000 | ORAL_TABLET | Freq: Every day | ORAL | 11 refills | Status: DC

## 2022-04-07 NOTE — Progress Notes (Signed)
Subjective:   Chief complaint: Follow-up for HIV disease  Patient ID: Thomas Park, male    DOB: 1991-03-27, 31 y.o.   MRN: [id number]  [name] is a 31 y.o. 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   1.  White matter changes on MRI:   Differential included JC virus related PML though with successful viral suppression systemically seems unlikely   JC virus was negative for the second time   IRIS to JC? Possible though would have shown iris much earlier in his course.  He was diagnosed in 2019.  In December of that year he had a viral load of 669,000 but within a month of antegrade strand transfer inhibitor therapy his viral load was down to 199 and CD4 did come up from 10 to 90.  His virus was imperfectly suppressed and CD4 count did take a while to come up but is now been above 200 since January in fact it now nearing 500   CD8 mediated encephalopathy possible   Pathology did not show malignant cells flow cytology is pending   CMV seemed  unlikely, I had SOME CMV in blood but I doubt this is a CMV CNS infection   CSF has been taken as a 3 white cells seen.   CSF  sent to [facility] for HIV RNA since HIV viral escape was  also a leading possible cause here   We gave him a trial of corticosteroids in the hospital with high-dose Solu-Medrol 500 mg every 12 hours x5 days without any improvement in his ataxia.  He was seen by Dr. Katy Park with ophthalmology who found no evidence of opportunistic infection in the eyes.  Dr. Katy Park did have some concerns that the patient might have some glaucoma in one eye and would like to see him in the clinic.  We  did intensify his HIV medication regimen  by adding Pifeltro to his Biktarvy.  CSF from his lumbar puncture was sent to [facility] for AR and viral load came back at 50 copies.  This does not seem like viral escape to me and seems to me the reason we should not need to have his regimen intensified.  Because he did not improve on corticosteroids they were stopped and I stopped all of his antitoxoplasmosis drugs as he is had successful immune reconstitution for more than 6 months and has a normal CD4 count numerically.  He had since seen Thomas Gobble, MD a Neurologist at [facility] with particular expertise in Neuro HIV in    Patient was referred to neuro-ophthalmology and was seen by Dr Thomas Park who has recommended patient wearing patch or glasses to vision in one eye as this is the only maneuver that seems to correct the patient's problems with persistent diplopia.  He has not been seen by [facility] since August but was supposed to follow-up with Dr. Leanne Park.  I spoke with Dr. 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  While she and I both agree that it was likely too late for an intervention such as corticosteroids to make a difference in the fact that the patient did not improve clinically while an inpatient on high-dose  Solu-Medrol both from a clinical standpoint and radiographic standpoint argued against this.  However she felt that still be worthwhile giving him another trial of corticosteroids and we arrange for him to be seen in short stay and receive 1 g of Solu-Medrol daily for 3 days l followed by a prednisone taper starting at 80 mg/day.  He had not had any improvement in his ataxia since then according to his father and him at his last visit.  I was able to get him into physical therapy and they have done 1 initial assessment but are waiting for approval..  he sister iwas concerned that the patient is been gaining weight and that the weight gain is getting in the way of  him improving his functional status.  Several visits ago he was not been able to continue with physical therapy because they started charging him too much money for what he and his family can afford.  His physical abilities neurological status has remained unchanged per father who accompanied him today to clinic at last visit.  When he came in with his sister  his ataxia seems somewhat improved They hadnot been able to see physical therapy again due to cost had an appoint with Dr. Ferrel Park at [facility] but it was canceled and I will need to have it rescheduled.   I had notice that his hemoglobin and hematocrit have been creeping up over the last year or so and the same time.  That he has been gaining more more weight.  Questioning him and his sister apparently he does snore and apparently has always snored.  He denied waking up gasping for air at night he has been sleepy however during the day.  There is no smoking going on or previously.  I referred him for sleep study but I tdo not think this has been done.          In follow-up today accompanied by his father and with Spanish translator he appears to be doing much better he is quite talkative when he first came into the room more so than I can recall his father does state that the patient's become much better and being able to do many of the activities of daily living that he used to not be able to do.  He is going to renew his HIV medication assistance program.        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    Social History   Socioeconomic History   Marital status: Single    Spouse name: Not on file   Number of children: Not on file   Years of education: Not on file   Highest education level: Not on file  Occupational History   Not on file  Tobacco Use   Smoking status: Never   Smokeless tobacco: Never  Vaping Use   Vaping Use: Never used  Substance and Sexual Activity   Alcohol use: Not Currently    Alcohol/week: 6.0 standard drinks of alcohol    Types: 6 Cans of beer per week    Comment: 08/10/2018 "drink only on weekend"   Drug use: Not Currently    Comment: 08/10/2018 "have tried coke; been a long time since I've usd any"   Sexual activity: Not Currently    Comment: declined condoms  Other Topics Concern   Not on file  Social History Narrative   Not on file   Social Determinants of Health   Financial Resource Strain: Not on file  Food Insecurity: Not on file  Transportation Needs: Not on file  Physical Activity: Not on file  Stress: Not on file  Social Connections: Not on file    Allergies  Allergen Reactions   Sulfa Antibiotics Rash     Current Outpatient Medications:    acetaminophen (TYLENOL) 325 MG tablet, Take 2 tablets (650 mg total) by mouth every 6 (six) hours as needed for mild pain (or Fever >/= 101)., Disp: , Rfl:    bictegravir-emtricitabine-tenofovir AF (BIKTARVY) 50-200-25 MG TABS tablet, Take 1 tablet by mouth daily., Disp: 30 tablet, Rfl: 11   omeprazole (PRILOSEC) 20 MG capsule, Take 1 capsule (20 mg total) by mouth daily., Disp: 30 capsule, Rfl: 11    Review of Systems  Unable to perform ROS: Dementia       Objective:   Physical Exam Constitutional:      Appearance: He is well-developed.  HENT:     Head: Normocephalic and atraumatic.   Eyes:     Conjunctiva/sclera: Conjunctivae normal.  Cardiovascular:     Rate and Rhythm: Normal rate and regular rhythm.  Pulmonary:     Effort: Pulmonary effort is normal. No respiratory distress.     Breath sounds: No wheezing.  Abdominal:     General: There is no distension.     Palpations: Abdomen is soft.  Musculoskeletal:        General: No tenderness. Normal range of motion.     Cervical back: Normal range of motion and neck supple.  Skin:    General: Skin is warm and dry.     Coloration: Skin is not pale.     Findings: No erythema or rash.  Neurological:     Mental Status: He is alert and oriented to person, place, and time.  Psychiatric:        Mood and Affect: Mood normal.        Behavior: Behavior normal.        Thought Content: Thought content normal.        Judgment: Judgment normal.      Assessment & Plan:   HIV disease:  I am Checking a viral load and CD4 count and continue his Biktarvy.  GERD  continuing his omeprazole   White matter changes on MRI see prior extensive discussions with regards to this.  Weight gain: Seems to have leveled off this point time  Toxoplasmosis cured  Polycythemia: Questionable this could be due to undiagnosed sleep apnea.  CBC being repeated

## 2022-04-08 LAB — T-HELPER CELLS (CD4) COUNT (NOT AT ARMC)
CD4 % Helper T Cell: 20 % — ABNORMAL LOW (ref 33–65)
CD4 T Cell Abs: 470 /uL (ref 400–1790)

## 2022-04-10 LAB — COMPLETE METABOLIC PANEL WITH GFR
AG Ratio: 1 (calc) (ref 1.0–2.5)
ALT: 41 U/L (ref 9–46)
AST: 30 U/L (ref 10–40)
Albumin: 4.3 g/dL (ref 3.6–5.1)
Alkaline phosphatase (APISO): 118 U/L (ref 36–130)
BUN: 17 mg/dL (ref 7–25)
CO2: 24 mmol/L (ref 20–32)
Calcium: 9.5 mg/dL (ref 8.6–10.3)
Chloride: 105 mmol/L (ref 98–110)
Creat: 0.89 mg/dL (ref 0.60–1.26)
Globulin: 4.5 g/dL (calc) — ABNORMAL HIGH (ref 1.9–3.7)
Glucose, Bld: 116 mg/dL — ABNORMAL HIGH (ref 65–99)
Potassium: 3.9 mmol/L (ref 3.5–5.3)
Sodium: 137 mmol/L (ref 135–146)
Total Bilirubin: 0.7 mg/dL (ref 0.2–1.2)
Total Protein: 8.8 g/dL — ABNORMAL HIGH (ref 6.1–8.1)
eGFR: 117 mL/min/{1.73_m2} (ref >=60)

## 2022-04-10 LAB — CBC WITH DIFFERENTIAL/PLATELET
Absolute Monocytes: 499 cells/uL (ref 200–950)
Basophils Absolute: 51 cells/uL (ref 0–200)
Basophils Relative: 0.8 %
Eosinophils Absolute: 160 cells/uL (ref 15–500)
Eosinophils Relative: 2.5 %
HCT: 49.1 % (ref 38.5–50.0)
Hemoglobin: 16.5 g/dL (ref 13.2–17.1)
Lymphs Abs: 2874 cells/uL (ref 850–3900)
MCH: 27.2 pg (ref 27.0–33.0)
MCHC: 33.6 g/dL (ref 32.0–36.0)
MCV: 81 fL (ref 80.0–100.0)
MPV: 9.9 fL (ref 7.5–12.5)
Monocytes Relative: 7.8 %
Neutro Abs: 2816 cells/uL (ref 1500–7800)
Neutrophils Relative %: 44 %
Platelets: 335 10*3/uL (ref 140–400)
RBC: 6.06 10*6/uL — ABNORMAL HIGH (ref 4.20–5.80)
RDW: 13.3 % (ref 11.0–15.0)
Total Lymphocyte: 44.9 %
WBC: 6.4 10*3/uL (ref 3.8–10.8)

## 2022-04-10 LAB — RPR: RPR Ser Ql: NONREACTIVE

## 2022-04-10 LAB — LIPID PANEL
Cholesterol: 180 mg/dL (ref <200)
HDL: 28 mg/dL — ABNORMAL LOW (ref >=40)
Non-HDL Cholesterol (Calc): 152 mg/dL (calc) — ABNORMAL HIGH (ref <130)
Total CHOL/HDL Ratio: 6.4 (calc) — ABNORMAL HIGH (ref <5.0)
Triglycerides: 484 mg/dL — ABNORMAL HIGH (ref <150)

## 2022-04-10 LAB — HIV-1 RNA QUANT-NO REFLEX-BLD
HIV 1 RNA Quant: NOT DETECTED Copies/mL
HIV-1 RNA Quant, Log: NOT DETECTED Log cps/mL

## 2022-08-03 ENCOUNTER — Encounter: Payer: Self-pay | Admitting: Infectious Disease

## 2022-08-03 DIAGNOSIS — Z7185 Encounter for immunization safety counseling: Secondary | ICD-10-CM

## 2022-08-03 HISTORY — DX: Encounter for immunization safety counseling: Z71.85

## 2022-08-03 NOTE — Progress Notes (Unsigned)
Subjective:   Chief complaint: follow-up for HIV disease on medications   Patient ID: Thomas Park, male    DOB: 05-26-1991, 31 y.o.   MRN: [id number]  Thomas Park is a 31 y.o. 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   1.  White matter changes on MRI:   Differential included JC virus related PML though with successful viral suppression systemically seems unlikely   JC virus was negative for the second time   IRIS to JC? Possible though would have shown iris much earlier in his course.  He was diagnosed in 2019.  In December of that year he had a viral load of 669,000 but within a month of antegrade strand transfer inhibitor therapy his viral load was down to 199 and CD4 did come up from 10 to 90.  His virus was imperfectly suppressed and CD4 count did take a while to come up but is now been above 200 since January in fact it now nearing 500   CD8 mediated encephalopathy possible   Pathology did not show malignant cells flow cytology is pending   CMV seemed  unlikely, I had SOME CMV in blood but I doubt this is a CMV CNS infection   CSF has been taken as a 3 white cells seen.   CSF  sent to [facility] for HIV RNA since HIV viral escape was  also a leading possible cause here   We gave him a trial of corticosteroids in the hospital with high-dose Solu-Medrol 500 mg every 12 hours x5 days without any improvement in his ataxia.  He was seen by [name] with ophthalmology who found no evidence of opportunistic infection in the eyes.  [name] did have some concerns that the patient might have some glaucoma in one eye and would like to see him in the clinic.  We  did intensify his HIV  medication regimen by adding Pifeltro to his Biktarvy.  CSF from his lumbar puncture was sent to [facility] for AR and viral load came back at 50 copies.  This does not seem like viral escape to me and seems to me the reason we should not need to have his regimen intensified.  Because he did not improve on corticosteroids they were stopped and I stopped all of his antitoxoplasmosis drugs as he is had successful immune reconstitution for more than 6 months and has a normal CD4 count numerically.  He had since seen Thomas Gobble, MD a Neurologist at [facility] with particular expertise in Neuro HIV in    Patient was referred to neuro-ophthalmology and was seen by Dr Sallye Park who has recommended patient wearing patch or glasses to vision in one eye as this is the only maneuver that seems to correct the patient's problems with persistent diplopia.  He has not been seen by [facility] since August but was supposed to follow-up with Dr. Leanne Park.  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  While she and I both agree that it was likely too late for an intervention such as corticosteroids to make a difference in the fact that the patient did not improve clinically while an  inpatient on high-dose Solu-Medrol both from a clinical standpoint and radiographic standpoint argued against this.  However she felt that still be worthwhile giving him another trial of corticosteroids and we arrange for him to be seen in short stay and receive 1 g of Solu-Medrol daily for 3 days l followed by a prednisone taper starting at 80 mg/day.  He had not had any improvement in his ataxia since then according to his father and him at his last visit.  I was able to get him into physical therapy and they have done 1 initial assessment but are waiting for approval..  he sister iwas concerned that the patient is been gaining weight and that the weight gain is  getting in the way of him improving his functional status.  Several visits ago he was not been able to continue with physical therapy because they started charging him too much money for what he and his family can afford.  His physical abilities neurological status has remained unchanged per father who accompanied him today to clinic at last visit.  When he came in with his sister  his ataxia seems somewhat improved They hadnot been able to see physical therapy again due to cost had an appoint with Thomas Park at [facility] but it was canceled and I will need to have it rescheduled.   I had notice that his hemoglobin and hematocrit have been creeping up over the last year or so and the same time.  That he has been gaining more more weight.  Questioning him and his sister apparently he does snore and apparently has always snored.  He denied waking up gasping for air at night he has been sleepy however during the day.  There is no smoking going on or previously.  I referred him for sleep study but I tdo not think this has been done.          In follow-up today accompanied by his father and with Spanish translator he appears to be doing much better he is quite talkative when he first came into the room more so than I can recall his father does state that the patient's become much better and being able to do many of the activities of daily living that he used to not be able to do.  He is going to renew his HIV medication assistance program.        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    Social History   Socioeconomic History   Marital status: Single    Spouse name: Not on file   Number of children: Not on file   Years of education: Not on file   Highest education level: Not on file  Occupational History   Not on file  Tobacco Use   Smoking status: Never   Smokeless  tobacco: Never  Vaping Use   Vaping Use: Never used  Substance and Sexual Activity   Alcohol use: Not Currently    Alcohol/week: 6.0 standard drinks of alcohol    Types: 6 Cans of beer per week    Comment: 08/10/2018 "drink only on weekend"   Drug use: Not Currently    Comment: 08/10/2018 "have tried coke; been a long time since I've usd any"   Sexual activity: Not Currently    Comment: declined condoms  Other Topics Concern   Not on file  Social History Narrative   Not on file   Social Determinants of Health   Financial Resource Strain: Not on file  Food Insecurity: Not on file  Transportation Needs: Not on file  Physical Activity: Not on file  Stress: Not on file  Social Connections: Not on file    Allergies  Allergen Reactions   Sulfa Antibiotics Rash    Pt states he is not allergic to sulfa, interpreter present     Current Outpatient Medications:    acetaminophen (TYLENOL) 325 MG tablet, Take 2 tablets (650 mg total) by mouth every 6 (six) hours as needed for mild pain (or Fever >/= 101)., Disp: , Rfl:    bictegravir-emtricitabine-tenofovir AF (BIKTARVY) 50-200-25 MG TABS tablet, Take 1 tablet by mouth daily., Disp: 30 tablet, Rfl: 11   omeprazole (PRILOSEC) 20 MG capsule, Take 1 capsule (20 mg total) by mouth daily., Disp: 30 capsule, Rfl: 11    Review of Systems  Unable to perform ROS: Dementia       Objective:   Physical Exam Constitutional:       Appearance: He is well-developed.  HENT:     Head: Normocephalic and atraumatic.  Eyes:     Conjunctiva/sclera: Conjunctivae normal.  Cardiovascular:     Rate and Rhythm: Normal rate and regular rhythm.  Pulmonary:     Effort: Pulmonary effort is normal. No respiratory distress.     Breath sounds: No wheezing.  Abdominal:     General: There is no distension.     Palpations: Abdomen is soft.  Musculoskeletal:        General: No tenderness. Normal range of motion.     Cervical back: Normal range of motion and neck supple.  Skin:    General: Skin is warm and dry.     Coloration: Skin is not pale.     Findings: No erythema or rash.  Neurological:     General: No focal deficit present.     Mental Status: He is alert.  Psychiatric:        Mood and Affect: Mood normal.        Behavior: Behavior normal.        Thought Content: Thought content normal.        Cognition and Memory: Cognition is impaired. Memory is impaired. He exhibits impaired recent memory and impaired remote memory.        Judgment: Judgment normal.     Assessment & Plan:   HIV disease:  I will add order HIV viral load CD4 count CBC with differential CMP, RPR GC and chlamydia and I will continue  [place] prescription  GERD: continue omeprazole   White matter changes in CNS; see above discussion  Weight gain  Toxoplasmosis cured  Polycythemia: Questionable this could be due to undiagnosed sleep apnea.  Vaccine counseling: recommended updated COVID 19 and flu shots.

## 2022-08-04 ENCOUNTER — Ambulatory Visit (INDEPENDENT_AMBULATORY_CARE_PROVIDER_SITE_OTHER): Payer: Self-pay

## 2022-08-04 ENCOUNTER — Other Ambulatory Visit: Payer: Self-pay

## 2022-08-04 ENCOUNTER — Encounter: Payer: Self-pay | Admitting: Infectious Disease

## 2022-08-04 ENCOUNTER — Ambulatory Visit (INDEPENDENT_AMBULATORY_CARE_PROVIDER_SITE_OTHER): Payer: Self-pay | Admitting: Infectious Disease

## 2022-08-04 VITALS — BP 139/89 | HR 94 | Temp 97.5°F | Wt 215.0 lb

## 2022-08-04 DIAGNOSIS — K219 Gastro-esophageal reflux disease without esophagitis: Secondary | ICD-10-CM

## 2022-08-04 DIAGNOSIS — Z7185 Encounter for immunization safety counseling: Secondary | ICD-10-CM

## 2022-08-04 DIAGNOSIS — R9082 White matter disease, unspecified: Secondary | ICD-10-CM

## 2022-08-04 DIAGNOSIS — Z23 Encounter for immunization: Secondary | ICD-10-CM

## 2022-08-04 DIAGNOSIS — H538 Other visual disturbances: Secondary | ICD-10-CM

## 2022-08-04 DIAGNOSIS — B582 Toxoplasma meningoencephalitis: Secondary | ICD-10-CM

## 2022-08-04 DIAGNOSIS — B20 Human immunodeficiency virus [HIV] disease: Secondary | ICD-10-CM

## 2022-08-04 DIAGNOSIS — D751 Secondary polycythemia: Secondary | ICD-10-CM

## 2022-08-04 DIAGNOSIS — G4700 Insomnia, unspecified: Secondary | ICD-10-CM

## 2022-08-04 DIAGNOSIS — G939 Disorder of brain, unspecified: Secondary | ICD-10-CM

## 2022-08-04 HISTORY — DX: Insomnia, unspecified: G47.00

## 2022-08-04 MED ORDER — BIKTARVY 50-200-25 MG PO TABS: 1.0000 | ORAL_TABLET | Freq: Every day | ORAL | 11 refills | Status: DC

## 2022-08-04 MED ORDER — OMEPRAZOLE 20 MG PO CPDR: 20.0000 mg | DELAYED_RELEASE_CAPSULE | Freq: Every day | ORAL | 11 refills | Status: DC

## 2022-08-04 MED ORDER — TRAZODONE HCL 50 MG PO TABS: 50.0000 mg | ORAL_TABLET | Freq: Every day | ORAL | 11 refills | Status: DC

## 2022-08-05 LAB — URINE CYTOLOGY ANCILLARY ONLY
Chlamydia: NEGATIVE
Comment: NEGATIVE
Comment: NORMAL
Neisseria Gonorrhea: NEGATIVE

## 2022-08-06 LAB — CBC WITH DIFFERENTIAL/PLATELET
Absolute Monocytes: 648 cells/uL (ref 200–950)
Basophils Absolute: 56 cells/uL (ref 0–200)
Basophils Relative: 0.7 %
Eosinophils Absolute: 208 cells/uL (ref 15–500)
Eosinophils Relative: 2.6 %
HCT: 48.3 % (ref 38.5–50.0)
Hemoglobin: 16.4 g/dL (ref 13.2–17.1)
Lymphs Abs: 2824 cells/uL (ref 850–3900)
MCH: 27.8 pg (ref 27.0–33.0)
MCHC: 34 g/dL (ref 32.0–36.0)
MCV: 81.9 fL (ref 80.0–100.0)
MPV: 10.2 fL (ref 7.5–12.5)
Monocytes Relative: 8.1 %
Neutro Abs: 4264 cells/uL (ref 1500–7800)
Neutrophils Relative %: 53.3 %
Platelets: 388 10*3/uL (ref 140–400)
RBC: 5.9 10*6/uL — ABNORMAL HIGH (ref 4.20–5.80)
RDW: 14.3 % (ref 11.0–15.0)
Total Lymphocyte: 35.3 %
WBC: 8 10*3/uL (ref 3.8–10.8)

## 2022-08-06 LAB — LIPID PANEL
Cholesterol: 138 mg/dL (ref <200)
HDL: 25 mg/dL — ABNORMAL LOW (ref >=40)
Non-HDL Cholesterol (Calc): 113 mg/dL (calc) (ref <130)
Total CHOL/HDL Ratio: 5.5 (calc) — ABNORMAL HIGH (ref <5.0)
Triglycerides: 475 mg/dL — ABNORMAL HIGH (ref <150)

## 2022-08-06 LAB — COMPLETE METABOLIC PANEL WITH GFR
AG Ratio: 1.1 (calc) (ref 1.0–2.5)
ALT: 25 U/L (ref 9–46)
AST: 22 U/L (ref 10–40)
Albumin: 4.5 g/dL (ref 3.6–5.1)
Alkaline phosphatase (APISO): 130 U/L (ref 36–130)
BUN: 18 mg/dL (ref 7–25)
CO2: 24 mmol/L (ref 20–32)
Calcium: 9.4 mg/dL (ref 8.6–10.3)
Chloride: 105 mmol/L (ref 98–110)
Creat: 0.82 mg/dL (ref 0.60–1.26)
Globulin: 4 g/dL (calc) — ABNORMAL HIGH (ref 1.9–3.7)
Glucose, Bld: 90 mg/dL (ref 65–99)
Potassium: 4.2 mmol/L (ref 3.5–5.3)
Sodium: 139 mmol/L (ref 135–146)
Total Bilirubin: 0.5 mg/dL (ref 0.2–1.2)
Total Protein: 8.5 g/dL — ABNORMAL HIGH (ref 6.1–8.1)
eGFR: 120 mL/min/{1.73_m2} (ref >=60)

## 2022-08-06 LAB — HIV-1 RNA QUANT-NO REFLEX-BLD
HIV 1 RNA Quant: NOT DETECTED Copies/mL
HIV-1 RNA Quant, Log: NOT DETECTED Log cps/mL

## 2022-08-06 LAB — RPR: RPR Ser Ql: NONREACTIVE

## 2022-08-07 LAB — T-HELPER CELLS (CD4) COUNT (NOT AT ARMC)
CD4 % Helper T Cell: 20 % — ABNORMAL LOW (ref 33–65)
CD4 T Cell Abs: 502 /uL (ref 400–1790)

## 2022-10-08 ENCOUNTER — Ambulatory Visit: Payer: Self-pay

## 2022-10-08 ENCOUNTER — Ambulatory Visit: Payer: Self-pay | Admitting: Infectious Disease

## 2022-12-07 ENCOUNTER — Encounter: Payer: Self-pay | Admitting: Infectious Disease

## 2022-12-07 ENCOUNTER — Other Ambulatory Visit: Payer: Self-pay

## 2022-12-07 ENCOUNTER — Ambulatory Visit (INDEPENDENT_AMBULATORY_CARE_PROVIDER_SITE_OTHER): Payer: Self-pay | Admitting: Infectious Disease

## 2022-12-07 VITALS — BP 121/82 | HR 80 | Temp 97.3°F | Wt 212.0 lb

## 2022-12-07 DIAGNOSIS — H5789 Other specified disorders of eye and adnexa: Secondary | ICD-10-CM

## 2022-12-07 DIAGNOSIS — B20 Human immunodeficiency virus [HIV] disease: Secondary | ICD-10-CM

## 2022-12-07 DIAGNOSIS — K219 Gastro-esophageal reflux disease without esophagitis: Secondary | ICD-10-CM

## 2022-12-07 DIAGNOSIS — G4700 Insomnia, unspecified: Secondary | ICD-10-CM

## 2022-12-07 MED ORDER — TRAZODONE HCL 50 MG PO TABS: 50.0000 mg | ORAL_TABLET | Freq: Every day | ORAL | 11 refills | Status: DC

## 2022-12-07 MED ORDER — BIKTARVY 50-200-25 MG PO TABS: 1.0000 | ORAL_TABLET | Freq: Every day | ORAL | 11 refills | Status: DC

## 2022-12-07 NOTE — Progress Notes (Signed)
Subjective:   Chief complaint: Still suffering from insomnia   Patient ID: Thomas Park, male    DOB: [birth year], 32 y.o.   MRN: [id number]  Thomas Park is a 32 y.o. 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   1.  White matter changes on MRI:   Differential included JC virus related PML though with successful viral suppression systemically seems unlikely   JC virus was negative for the second time   IRIS to JC? Possible though would have shown iris much earlier in his course.  He was diagnosed in 2019.  In December of that year he had a viral load of 669,000 but within a month of antegrade strand transfer inhibitor therapy his viral load was down to 199 and CD4 did come up from 10 to 90.  His virus was imperfectly suppressed and CD4 count did take a while to come up but is now been above 200 since January in fact it now nearing 500   CD8 mediated encephalopathy possible   Pathology did not show malignant cells flow cytology is pending   CMV seemed  unlikely, I had SOME CMV in blood but I doubt this is a CMV CNS infection   CSF has been taken as a 3 white cells seen.   CSF  sent to [facility] for HIV RNA since HIV viral escape was  also a leading possible cause here   We gave him a trial of corticosteroids in the hospital with high-dose Solu-Medrol 500 mg every 12 hours x5 days without any improvement in his ataxia.  He was seen by [name] with ophthalmology who found no evidence of opportunistic infection in the eyes.  [name] did have some concerns that the patient might have some glaucoma in one eye and would like to see him in the clinic.  We  did intensify his HIV medication  regimen by adding Pifeltro to his Biktarvy.  CSF from his lumbar puncture was sent to [facility] for AR and viral load came back at 50 copies.  This does not seem like viral escape to me and seems to me the reason we should not need to have his regimen intensified.  Because he did not improve on corticosteroids they were stopped and I stopped all of his antitoxoplasmosis drugs as he is had successful immune reconstitution for more than 6 months and has a normal CD4 count numerically.  He had since seen [name], MD a Neurologist at [facility] with particular expertise in Neuro HIV in    Patient was referred to neuro-ophthalmology and was seen by Dr [name] who has recommended patient wearing patch or glasses to vision in one eye as this is the only maneuver that seems to correct the patient's problems with persistent diplopia.  He has not been seen by [facility] since August but was supposed to follow-up with Dr. [name].  I spoke with 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  While she and I both agree that it was likely too late for an intervention such as corticosteroids to make a difference in the fact that the patient did not improve clinically while an inpatient on  high-dose Solu-Medrol both from a clinical standpoint and radiographic standpoint argued against this.  However she felt that still be worthwhile giving him another trial of corticosteroids and we arrange for him to be seen in short stay and receive 1 g of Solu-Medrol daily for 3 days l followed by a prednisone taper starting at 80 mg/day.  He had not had any improvement in his ataxia since then according to his father and him at his last visit.  I was able to get him into physical therapy and they have done 1 initial assessment but are waiting for approval..  he sister iwas concerned that the patient is been gaining weight and that the weight gain is getting in the  way of him improving his functional status.  Several visits ago he was not been able to continue with physical therapy because they started charging him too much money for what he and his family can afford.  His physical abilities neurological status had  remained unchanged per father who accompanied him today to clinic at last visit.  When he came in with his sister  his ataxia seems somewhat improved They hadnot been able to see physical therapy again due to cost had an appoint with [name] at [facility] but it was canceled and I will need to have it rescheduled.   I had notice that his hemoglobin and hematocrit have been creeping up over the last year or so and the same time.  That he has been gaining more more weight.  Questioning him and his sister apparently he does snore and apparently has always snored.  He denied waking up gasping for air at night he has been sleepy however during the day.  There is no smoking going on or previously.  I referred him for sleep study but I tdo not think this has been done.          He returns to visit [place] again in similar to last visit he seems to be in good spirits he is accompanied by his father.  He still has some unsteadiness of his gait but is able to walk without assistance of a walker at times he has intermittent erythema in his left eye that resolves without intervention.  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    Social History   Socioeconomic History   Marital status: Single    Spouse name: Not on file   Number of children: Not on file   Years of education: Not on file   Highest education level: Not on file  Occupational History   Not on file  Tobacco  Use   Smoking status: Never   Smokeless tobacco: Never  Vaping Use   Vaping Use: Never used  Substance and Sexual Activity   Alcohol use: Not Currently    Alcohol/week: 6.0 standard drinks of alcohol    Types: 6 Cans of beer per week    Comment: 08/10/2018 "drink only on weekend"   Drug use: Not Currently    Comment: 08/10/2018 "have tried coke; been a long time since I've usd any"   Sexual activity: Not Currently    Comment: declined condoms  Other Topics Concern   Not on file  Social History Narrative   Not on file   Social Determinants of Health   Financial Resource Strain: Not on file  Food Insecurity: Not on file  Transportation Needs: Not on file  Physical Activity: Not on file  Stress: Not on file  Social Connections: Not on file    Allergies  Allergen Reactions   Sulfa Antibiotics Rash    Pt states he is not allergic to sulfa, interpreter present     Current Outpatient Medications:    bictegravir-emtricitabine-tenofovir AF (BIKTARVY) 50-200-25 MG TABS tablet, Take 1 tablet by mouth daily., Disp: 30 tablet, Rfl: 11   omeprazole (PRILOSEC) 20 MG capsule, Take 1 capsule (20 mg total) by mouth daily., Disp: 30 capsule, Rfl: 11   acetaminophen (TYLENOL) 325 MG tablet, Take 2 tablets (650 mg total) by mouth every 6 (six) hours as needed for mild pain (or Fever >/= 101). (Patient not taking: Reported on 12/07/2022), Disp: , Rfl:    traZODone (DESYREL) 50 MG  tablet, Take 1 tablet (50 mg total) by mouth at bedtime. (Patient not taking: Reported on 12/07/2022), Disp: 30 tablet, Rfl: 11    Review of Systems  Unable to perform ROS: Dementia       Objective:   Physical Exam Constitutional:      Appearance: He is obese.  Eyes:     General:        Right eye: No discharge.        Left eye: No discharge.     Extraocular Movements: Extraocular movements intact.  Cardiovascular:     Rate and Rhythm: Normal rate and regular rhythm.  Pulmonary:     Effort: Pulmonary effort is normal. No respiratory distress.     Breath sounds: No wheezing.  Skin:    General: Skin is warm and dry.  Neurological:     Mental Status: He is alert and oriented to person, place, and time.  Psychiatric:        Mood and Affect: Mood normal.        Speech: Speech is delayed.        Behavior: Behavior is cooperative.        Cognition and Memory: Memory is impaired. He exhibits impaired recent memory and impaired remote memory.      Assessment & Plan:   HIV disease:  I will add order HIV viral load CD4 count CBC with differential CMP, RPR GC and chlamydia and I will continue  [place], prescription  GERD: Continue PPI  Insomnia continue trazodone  Eye irritation: Clear related to his CN having been effected by Toxo.  There is any do other than some over-the-counter topical agents as needed

## 2022-12-08 LAB — URINE CYTOLOGY ANCILLARY ONLY
Chlamydia: NEGATIVE
Comment: NEGATIVE
Comment: NORMAL
Neisseria Gonorrhea: NEGATIVE

## 2022-12-08 LAB — T-HELPER CELLS (CD4) COUNT (NOT AT ARMC)
CD4 % Helper T Cell: 22 % — ABNORMAL LOW (ref 33–65)
CD4 T Cell Abs: 583 /uL (ref 400–1790)

## 2022-12-10 LAB — COMPLETE METABOLIC PANEL WITH GFR
AG Ratio: 1.3 (calc) (ref 1.0–2.5)
ALT: 23 U/L (ref 9–46)
AST: 20 U/L (ref 10–40)
Albumin: 4.5 g/dL (ref 3.6–5.1)
Alkaline phosphatase (APISO): 132 U/L — ABNORMAL HIGH (ref 36–130)
BUN: 15 mg/dL (ref 7–25)
CO2: 22 mmol/L (ref 20–32)
Calcium: 9.7 mg/dL (ref 8.6–10.3)
Chloride: 107 mmol/L (ref 98–110)
Creat: 0.9 mg/dL (ref 0.60–1.26)
Globulin: 3.6 g/dL (calc) (ref 1.9–3.7)
Glucose, Bld: 114 mg/dL — ABNORMAL HIGH (ref 65–99)
Potassium: 4 mmol/L (ref 3.5–5.3)
Sodium: 140 mmol/L (ref 135–146)
Total Bilirubin: 0.4 mg/dL (ref 0.2–1.2)
Total Protein: 8.1 g/dL (ref 6.1–8.1)
eGFR: 116 mL/min/{1.73_m2} (ref >=60)

## 2022-12-10 LAB — LIPID PANEL
Cholesterol: 145 mg/dL (ref <200)
HDL: 23 mg/dL — ABNORMAL LOW (ref >=40)
Non-HDL Cholesterol (Calc): 122 mg/dL (calc) (ref <130)
Total CHOL/HDL Ratio: 6.3 (calc) — ABNORMAL HIGH (ref <5.0)
Triglycerides: 823 mg/dL — ABNORMAL HIGH (ref <150)

## 2022-12-10 LAB — RPR: RPR Ser Ql: NONREACTIVE

## 2022-12-10 LAB — CBC WITH DIFFERENTIAL/PLATELET
Absolute Monocytes: 632 cells/uL (ref 200–950)
Basophils Absolute: 63 cells/uL (ref 0–200)
Basophils Relative: 0.8 %
Eosinophils Absolute: 190 cells/uL (ref 15–500)
Eosinophils Relative: 2.4 %
HCT: 49.2 % (ref 38.5–50.0)
Hemoglobin: 16.4 g/dL (ref 13.2–17.1)
Lymphs Abs: 2947 cells/uL (ref 850–3900)
MCH: 27.6 pg (ref 27.0–33.0)
MCHC: 33.3 g/dL (ref 32.0–36.0)
MCV: 82.8 fL (ref 80.0–100.0)
MPV: 9.8 fL (ref 7.5–12.5)
Monocytes Relative: 8 %
Neutro Abs: 4069 cells/uL (ref 1500–7800)
Neutrophils Relative %: 51.5 %
Platelets: 492 10*3/uL — ABNORMAL HIGH (ref 140–400)
RBC: 5.94 10*6/uL — ABNORMAL HIGH (ref 4.20–5.80)
RDW: 13.3 % (ref 11.0–15.0)
Total Lymphocyte: 37.3 %
WBC: 7.9 10*3/uL (ref 3.8–10.8)

## 2022-12-10 LAB — HIV-1 RNA QUANT-NO REFLEX-BLD
HIV 1 RNA Quant: <20 Copies/mL — ABNORMAL HIGH
HIV-1 RNA Quant, Log: <1.3 Log cps/mL — ABNORMAL HIGH

## 2023-03-25 ENCOUNTER — Ambulatory Visit: Payer: Self-pay

## 2023-04-13 NOTE — Progress Notes (Unsigned)
NOTE IN PERSON SPANISH translator is present.  Subjective:   Chief complaint: Follow-up for HIV disease with history of AIDS and toxoplasmosis   Patient ID: Thomas Park, male    DOB: 09/30/1990, 32 y.o.   MRN: [id number]  HPI  Thomas Park is a 32 y.o. 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   1.  White matter changes on MRI:   Differential included JC virus related PML though with successful viral suppression systemically seems unlikely   JC virus was negative for the second time   IRIS to JC? Possible though would have shown iris much earlier in his course.  He was diagnosed in 2019.  In December of that year he had a viral load of 669,000 but within a month of antegrade strand transfer inhibitor therapy his viral load was down to 199 and CD4 did come up from 10 to 90.  His virus was imperfectly suppressed and CD4 count did take a while to come up but is now been above 200 since January in fact it now nearing 500   CD8 mediated encephalopathy possible   Pathology did not show malignant cells flow cytology is pending   CMV seemed  unlikely, I had SOME CMV in blood but I doubt this is a CMV CNS infection   CSF has been taken as a 3 white cells seen.   CSF  sent to [facility] for HIV RNA since HIV viral escape was  also a leading possible cause here   We gave him a trial of corticosteroids in the hospital with high-dose Solu-Medrol 500 mg every 12 hours x5 days without any improvement in his ataxia.  He was seen by Dr. Dione Booze with ophthalmology who found no evidence of opportunistic infection in the eyes.  Dr. Dione Booze did have some concerns that the patient might have some glaucoma in one eye  and would like to see him in the clinic.  We  did intensify his HIV medication regimen by adding Pifeltro to his Biktarvy.  CSF from his lumbar puncture was sent to [facility] for AR and viral load came back at 50 copies.  This does not seem like viral escape to me and seems to me the reason we should not need to have his regimen intensified.  Because he did not improve on corticosteroids they were stopped and I stopped all of his antitoxoplasmosis drugs as he is had successful immune reconstitution for more than 6 months and has a normal CD4 count numerically.  He had since seen Quillian Quince, MD a Neurologist at [facility] with particular expertise in Neuro HIV in    Patient was referred to neuro-ophthalmology and was seen by Dr Pamala Duffel who has recommended patient wearing patch or glasses to vision in one eye as this is the only maneuver that seems to correct the patient's problems with persistent diplopia.  He has not been seen by [facility] since August but was supposed to follow-up with Dr. Cato Mulligan.  I spoke with Dr. 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  While she and I both agree that it was likely too late for an intervention such as corticosteroids to make a difference in  the fact that the patient did not improve clinically while an inpatient on high-dose Solu-Medrol both from a clinical standpoint and radiographic standpoint argued against this.  However she felt that still be worthwhile giving him another trial of corticosteroids and we arrange for him to be seen in short stay and receive 1 g of Solu-Medrol daily for 3 days l followed by a prednisone taper starting at 80 mg/day.  He had not had any improvement in his ataxia since then according to his father and him at his last visit.  I was able to get him into physical therapy and they have done 1 initial assessment but are waiting for approval..  he sister iwas concerned  that the patient is been gaining weight and that the weight gain is getting in the way of him improving his functional status.  Several visits ago he was not been able to continue with physical therapy because they started charging him too much money for what he and his family can afford.  His physical abilities neurological status had  remained unchanged per father who accompanied him today to clinic at last visit.  When he came in with his sister  his ataxia seems somewhat improved They hadnot been able to see physical therapy again due to cost had an appoint with Dr. Trisha Mangle at [facility] but it was canceled and I will need to have it rescheduled.   I had notice that his hemoglobin and hematocrit have been creeping up over the last year or so and the same time.  That he has been gaining more more weight.  Questioning him and his sister apparently he does snore and apparently has always snored.  He denied waking up gasping for air at night he has been sleepy however during the day.  There is no smoking going on or previously.  I referred him for sleep study but I tdo not think this has been done.          Today he was in good spirits.  His father confirmed that his progress is stable and he has not had any deterioration in his functioning.                 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    Social History   Socioeconomic History   Marital status: Single    Spouse name: Not on file   Number of children: Not on file   Years of education: Not on file   Highest education level: Not on file  Occupational History   Not on file  Tobacco Use   Smoking status: Never   Smokeless tobacco: Never  Vaping Use   Vaping status: Never Used  Substance and Sexual Activity   Alcohol use: Not Currently  Alcohol/week: 6.0 standard drinks of alcohol    Types: 6 Cans of beer per week    Comment: 08/10/2018 "drink only on weekend"   Drug use: Not Currently    Comment: 08/10/2018 "have tried coke; been a long time since I've usd any"   Sexual activity: Not Currently    Comment: declined condoms  Other Topics Concern   Not on file  Social History Narrative   Not on file   Social Determinants of Health   Financial Resource Strain: Not on file  Food Insecurity: Not on file  Transportation Needs: Not on file  Physical Activity: Not on file  Stress: Not on file  Social Connections: Not on file    Allergies  Allergen Reactions   Sulfa Antibiotics Rash    Pt states he is not allergic to sulfa, interpreter present     Current Outpatient Medications:    acetaminophen (TYLENOL) 325 MG tablet, Take 2 tablets (650 mg total) by mouth every 6 (six) hours as needed for mild pain (or Fever >/= 101). (Patient not taking: Reported on 12/07/2022), Disp: , Rfl:    bictegravir-emtricitabine-tenofovir AF (BIKTARVY) 50-200-25 MG TABS tablet, Take 1 tablet by mouth daily., Disp: 30 tablet, Rfl: 11   omeprazole (PRILOSEC) 20 MG capsule, Take 1 capsule (20 mg total) by mouth daily., Disp: 30 capsule, Rfl: 11   traZODone (DESYREL) 50 MG tablet, Take 1 tablet (50 mg total) by mouth at bedtime., Disp: 30 tablet, Rfl: 11    Review of Systems  Unable to perform ROS: Dementia       Objective:    Physical Exam Constitutional:      Appearance: He is obese.  HENT:     Head: Normocephalic and atraumatic.  Eyes:     Extraocular Movements: Extraocular movements intact.  Pulmonary:     Effort: Pulmonary effort is normal. No respiratory distress.     Breath sounds: No wheezing.  Abdominal:     General: There is no distension.     Tenderness: There is no abdominal tenderness.  Musculoskeletal:        General: Normal range of motion.  Skin:    General: Skin is warm and dry.  Neurological:     Mental Status: He is alert.  Psychiatric:        Attention and Perception: Attention normal.        Mood and Affect: Mood normal.        Speech: Speech is delayed.        Behavior: Behavior normal.        Thought Content: Thought content normal.        Cognition and Memory: Memory is impaired. He exhibits impaired recent memory.      Assessment & Plan:   HIV disease:  I will add order HIV viral load CD4 count CBC with differential CMP, RPR GC and chlamydia and I will continue  Thomas Park, prescription  Esophageal reflux disease: Continue PPI  Insomnia continue trazodone    Cognition and ataxia: appears stable but he will to be in a structured environment.  Likely sleep apnea: Would like him to get the sleep study done.

## 2023-04-14 ENCOUNTER — Ambulatory Visit: Payer: Self-pay

## 2023-04-14 ENCOUNTER — Other Ambulatory Visit: Payer: Self-pay

## 2023-04-14 ENCOUNTER — Ambulatory Visit (INDEPENDENT_AMBULATORY_CARE_PROVIDER_SITE_OTHER): Payer: Self-pay | Admitting: Infectious Disease

## 2023-04-14 VITALS — BP 127/82 | HR 96 | Resp 16 | Ht 72.0 in | Wt 215.0 lb

## 2023-04-14 DIAGNOSIS — G629 Polyneuropathy, unspecified: Secondary | ICD-10-CM

## 2023-04-14 DIAGNOSIS — R270 Ataxia, unspecified: Secondary | ICD-10-CM

## 2023-04-14 DIAGNOSIS — R269 Unspecified abnormalities of gait and mobility: Secondary | ICD-10-CM

## 2023-04-14 DIAGNOSIS — H538 Other visual disturbances: Secondary | ICD-10-CM

## 2023-04-14 DIAGNOSIS — D893 Immune reconstitution syndrome: Secondary | ICD-10-CM

## 2023-04-14 DIAGNOSIS — B589 Toxoplasmosis, unspecified: Secondary | ICD-10-CM

## 2023-04-14 DIAGNOSIS — R635 Abnormal weight gain: Secondary | ICD-10-CM

## 2023-04-14 DIAGNOSIS — B20 Human immunodeficiency virus [HIV] disease: Secondary | ICD-10-CM

## 2023-04-14 LAB — CBC WITH DIFFERENTIAL/PLATELET
Absolute Monocytes: 570 cells/uL (ref 200–950)
Basophils Absolute: 67 cells/uL (ref 0–200)
Basophils Relative: 0.9 %
Eosinophils Absolute: 252 cells/uL (ref 15–500)
Eosinophils Relative: 3.4 %
HCT: 47.4 % (ref 38.5–50.0)
Hemoglobin: 15.9 g/dL (ref 13.2–17.1)
Lymphs Abs: 2486 cells/uL (ref 850–3900)
MCH: 27.8 pg (ref 27.0–33.0)
MCHC: 33.5 g/dL (ref 32.0–36.0)
MCV: 83 fL (ref 80.0–100.0)
MPV: 10.4 fL (ref 7.5–12.5)
Monocytes Relative: 7.7 %
Neutro Abs: 4026 cells/uL (ref 1500–7800)
Neutrophils Relative %: 54.4 %
Platelets: 365 10*3/uL (ref 140–400)
RBC: 5.71 10*6/uL (ref 4.20–5.80)
RDW: 13.7 % (ref 11.0–15.0)
Total Lymphocyte: 33.6 %
WBC: 7.4 10*3/uL (ref 3.8–10.8)

## 2023-04-14 MED ORDER — TRAZODONE HCL 50 MG PO TABS: 50.0000 mg | ORAL_TABLET | Freq: Every day | ORAL | 11 refills | Status: DC

## 2023-04-14 MED ORDER — OMEPRAZOLE 20 MG PO CPDR: 20.0000 mg | DELAYED_RELEASE_CAPSULE | Freq: Every day | ORAL | 11 refills | Status: DC

## 2023-04-14 MED ORDER — BIKTARVY 50-200-25 MG PO TABS
1.0000 | ORAL_TABLET | Freq: Every day | ORAL | 11 refills | Status: DC
Start: 2023-04-14 — End: 2023-10-19

## 2023-10-18 NOTE — Progress Notes (Signed)
 Subjective:  Chief complaint follow-up for HIV disease on medications    Patient ID: Thomas Park, male    DOB: 1991/03/17, 33 y.o.   MRN: [id number]  HPI  Discussed the use of AI scribe software for clinical note transcription with the patient, who gave verbal consent to proceed.  History of Present Illness   The patient, with a history of HIV, presents with low energy and changes in appetite over the past two weeks. He also reports a sensation of something being stuck in his chest or throat after eating, which he feels the need to expel. He has been managing his HIV with Biktarvy , which has been effective in maintaining an undetectable viral load and a CD4 count of 520. He also takes trazodone  for insomnia and omeprazole  for heartburn. However, he reports that the trazodone  has not been very effective in improving his sleep though he sleeps better now than before. He still has ataxia, likely related to the white matter disease we have worked up extensively.       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    Social History   Socioeconomic History   Marital status: Single    Spouse name: Not on file   Number of children: Not on file   Years of education: Not on file   Highest  education level: Not on file  Occupational History   Not on file  Tobacco Use   Smoking status: Never   Smokeless tobacco: Never  Vaping Use   Vaping status: Never Used  Substance and Sexual Activity   Alcohol  use: Not Currently    Alcohol /week: 6.0 standard drinks of alcohol     Types: 6 Cans of beer per week    Comment: 08/10/2018 drink only on weekend   Drug use: Not Currently    Comment: 08/10/2018 have tried coke; been a long time since I've usd any   Sexual activity: Not Currently    Comment: declined condoms  Other Topics Concern   Not on file  Social History Narrative   Not on file   Social Drivers of Health   Financial Resource Strain: Not on file  Food Insecurity: Not on file  Transportation Needs: Not on file  Physical Activity: Not on file  Stress: Not on file  Social Connections: Not on file    Allergies  Allergen Reactions   Sulfa  Antibiotics Rash    Pt states he is not allergic to sulfa , interpreter present     Current Outpatient Medications:    bictegravir-emtricitabine -tenofovir  AF (BIKTARVY ) 50-200-25 MG TABS tablet, Take 1 tablet by mouth daily., Disp: 30 tablet, Rfl: 11   omeprazole  (PRILOSEC) 20 MG capsule, Take 1 capsule (20 mg total) by mouth daily., Disp: 30 capsule, Rfl: 11  traZODone  (DESYREL ) 50 MG tablet, Take 1 tablet (50 mg total) by mouth at bedtime., Disp: 30 tablet, Rfl: 11   Review of Systems  Unable to perform ROS: Dementia       Objective:   Physical Exam Constitutional:      Appearance: He is well-developed.  HENT:     Head: Normocephalic and atraumatic.  Eyes:     Conjunctiva/sclera: Conjunctivae normal.  Cardiovascular:     Rate and Rhythm: Normal rate and regular rhythm.  Pulmonary:     Effort: Pulmonary effort is normal. No respiratory distress.     Breath sounds: No wheezing.  Abdominal:     General: There is no distension.     Palpations: Abdomen is soft.  Musculoskeletal:        General: No tenderness.  Normal range of motion.     Cervical back: Normal range of motion and neck supple.  Skin:    General: Skin is warm and dry.     Coloration: Skin is not pale.     Findings: No erythema or rash.  Neurological:     General: No focal deficit present.     Mental Status: He is alert and oriented to person, place, and time.  Psychiatric:        Attention and Perception: Attention normal.        Mood and Affect: Mood normal.        Speech: Speech is delayed.        Thought Content: Thought content normal.        Judgment: Judgment normal.           Assessment & Plan:   Assessment and Plan    Foreign Body Sensation Reports sensation of something stuck in his chest or throat after eating. No other associated symptoms reported. -Refer to Gastroenterology for further evaluation with possible endoscopy.  HIV Well controlled on Biktarvy . Last labs in July showed undetectable viral load and CD4 count of 520. -Continue Biktarvy . -Check labs today including VL, CD4  Insomnia Reports inconsistent use of Trazodone  and variable sleep quality. -Continue Trazodone  as needed for insomnia.  GERD On Omeprazole  for heartburn. -Continue Omeprazole .  General Health Maintenance -Check immunization status for influenza and COVID-19 vaccines. -Return for follow-up in six months.

## 2023-10-19 ENCOUNTER — Encounter: Payer: Self-pay | Admitting: Infectious Disease

## 2023-10-19 ENCOUNTER — Ambulatory Visit (INDEPENDENT_AMBULATORY_CARE_PROVIDER_SITE_OTHER): Payer: Self-pay | Admitting: Infectious Disease

## 2023-10-19 ENCOUNTER — Other Ambulatory Visit: Payer: Self-pay

## 2023-10-19 VITALS — BP 114/83 | HR 109 | Temp 98.3°F | Wt 215.0 lb

## 2023-10-19 DIAGNOSIS — Z7185 Encounter for immunization safety counseling: Secondary | ICD-10-CM

## 2023-10-19 DIAGNOSIS — B582 Toxoplasma meningoencephalitis: Secondary | ICD-10-CM

## 2023-10-19 DIAGNOSIS — R09A2 Foreign body sensation, throat: Secondary | ICD-10-CM

## 2023-10-19 DIAGNOSIS — R270 Ataxia, unspecified: Secondary | ICD-10-CM

## 2023-10-19 DIAGNOSIS — R635 Abnormal weight gain: Secondary | ICD-10-CM

## 2023-10-19 DIAGNOSIS — D751 Secondary polycythemia: Secondary | ICD-10-CM

## 2023-10-19 DIAGNOSIS — Z23 Encounter for immunization: Secondary | ICD-10-CM

## 2023-10-19 DIAGNOSIS — R9082 White matter disease, unspecified: Secondary | ICD-10-CM

## 2023-10-19 DIAGNOSIS — B20 Human immunodeficiency virus [HIV] disease: Secondary | ICD-10-CM

## 2023-10-19 MED ORDER — OMEPRAZOLE 20 MG PO CPDR: 20.0000 mg | DELAYED_RELEASE_CAPSULE | Freq: Every day | ORAL | 11 refills | Status: DC

## 2023-10-19 MED ORDER — BIKTARVY 50-200-25 MG PO TABS: 1.0000 | ORAL_TABLET | Freq: Every day | ORAL | 11 refills | Status: DC

## 2023-10-19 MED ORDER — TRAZODONE HCL 50 MG PO TABS: 50.0000 mg | ORAL_TABLET | Freq: Every evening | ORAL | 11 refills | Status: DC | PRN

## 2023-10-20 LAB — URINE CYTOLOGY ANCILLARY ONLY
Chlamydia: NEGATIVE
Comment: NEGATIVE
Comment: NORMAL
Neisseria Gonorrhea: NEGATIVE

## 2023-10-21 LAB — T-HELPER CELLS (CD4) COUNT (NOT AT ARMC)
CD4 % Helper T Cell: 23 % — ABNORMAL LOW (ref 33–65)
CD4 T Cell Abs: 505 /uL (ref 400–1790)

## 2023-10-22 LAB — COMPLETE METABOLIC PANEL WITH GFR
AG Ratio: 1.3 (calc) (ref 1.0–2.5)
ALT: 23 U/L (ref 9–46)
AST: 18 U/L (ref 10–40)
Albumin: 4.5 g/dL (ref 3.6–5.1)
Alkaline phosphatase (APISO): 115 U/L (ref 36–130)
BUN: 15 mg/dL (ref 7–25)
CO2: 19 mmol/L — ABNORMAL LOW (ref 20–32)
Calcium: 9.7 mg/dL (ref 8.6–10.3)
Chloride: 104 mmol/L (ref 98–110)
Creat: 0.93 mg/dL (ref 0.60–1.26)
Globulin: 3.4 g/dL (ref 1.9–3.7)
Glucose, Bld: 116 mg/dL — ABNORMAL HIGH (ref 65–99)
Potassium: 3.8 mmol/L (ref 3.5–5.3)
Sodium: 138 mmol/L (ref 135–146)
Total Bilirubin: 0.7 mg/dL (ref 0.2–1.2)
Total Protein: 7.9 g/dL (ref 6.1–8.1)
eGFR: 111 mL/min/{1.73_m2} (ref >=60)

## 2023-10-22 LAB — LIPID PANEL
Cholesterol: 151 mg/dL (ref <200)
HDL: 28 mg/dL — ABNORMAL LOW (ref >=40)
LDL Cholesterol (Calc): 81 mg/dL
Non-HDL Cholesterol (Calc): 123 mg/dL (ref <130)
Total CHOL/HDL Ratio: 5.4 (calc) — ABNORMAL HIGH (ref <5.0)
Triglycerides: 351 mg/dL — ABNORMAL HIGH (ref <150)

## 2023-10-22 LAB — CBC WITH DIFFERENTIAL/PLATELET
Absolute Lymphocytes: 2244 {cells}/uL (ref 850–3900)
Absolute Monocytes: 483 {cells}/uL (ref 200–950)
Basophils Absolute: 43 {cells}/uL (ref 0–200)
Basophils Relative: 0.6 %
Eosinophils Absolute: 121 {cells}/uL (ref 15–500)
Eosinophils Relative: 1.7 %
HCT: 48.6 % (ref 38.5–50.0)
Hemoglobin: 15.8 g/dL (ref 13.2–17.1)
MCH: 27.5 pg (ref 27.0–33.0)
MCHC: 32.5 g/dL (ref 32.0–36.0)
MCV: 84.7 fL (ref 80.0–100.0)
MPV: 10.2 fL (ref 7.5–12.5)
Monocytes Relative: 6.8 %
Neutro Abs: 4210 {cells}/uL (ref 1500–7800)
Neutrophils Relative %: 59.3 %
Platelets: 353 10*3/uL (ref 140–400)
RBC: 5.74 10*6/uL (ref 4.20–5.80)
RDW: 14.1 % (ref 11.0–15.0)
Total Lymphocyte: 31.6 %
WBC: 7.1 10*3/uL (ref 3.8–10.8)

## 2023-10-22 LAB — RPR: RPR Ser Ql: NONREACTIVE

## 2023-10-22 LAB — HIV RNA, RTPCR W/R GT (RTI, PI,INT)
HIV 1 RNA Quant: 114 {copies}/mL — ABNORMAL HIGH
HIV-1 RNA Quant, Log: 2.06 {Log} — ABNORMAL HIGH

## 2024-01-03 ENCOUNTER — Encounter: Payer: Self-pay | Admitting: Infectious Disease

## 2024-04-18 ENCOUNTER — Ambulatory Visit: Payer: Self-pay

## 2024-04-18 ENCOUNTER — Ambulatory Visit: Payer: Self-pay | Admitting: Infectious Disease

## 2024-05-02 NOTE — Progress Notes (Unsigned)
  Chief complaint: follow-up for HIV disease on medications  Subjective:    Patient ID: Thomas Park, male    DOB: Feb 10, 1991, 33 y.o.   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    Social History   Socioeconomic History   Marital status: Single    Spouse name: Not on file   Number of children: Not on file   Years of education: Not on file   Highest education level: Not on file  Occupational History   Not on file  Tobacco Use   Smoking status: Never   Smokeless tobacco: Never  Vaping Use   Vaping status: Never Used  Substance and Sexual Activity   Alcohol  use: Not Currently    Alcohol /week: 6.0 standard drinks of alcohol     Types: 6 Cans of beer per week    Comment: 08/10/2018 drink only on weekend   Drug use: Not Currently    Comment: 08/10/2018 have tried coke; been a long time since I've usd any   Sexual activity: Not Currently    Comment: declined condoms  Other Topics Concern   Not on file  Social History Narrative   Not on file   Social Drivers of Health   Financial Resource Strain: Not on file  Food Insecurity: Not on file  Transportation Needs: Not on file  Physical Activity: Not on file  Stress: Not on file  Social  Connections: Not on file    Allergies  Allergen Reactions   Sulfa  Antibiotics Rash    Pt states he is not allergic to sulfa , interpreter present     Current Outpatient Medications:    bictegravir-emtricitabine -tenofovir  AF (BIKTARVY ) 50-200-25 MG TABS tablet, Take 1 tablet by mouth daily., Disp: 30 tablet, Rfl: 11   omeprazole  (PRILOSEC) 20 MG capsule, Take 1 capsule (20 mg total) by mouth daily., Disp: 30 capsule, Rfl: 11   traZODone  (DESYREL ) 50 MG tablet, Take 1 tablet (50 mg total) by mouth at bedtime as needed for sleep., Disp: 30 tablet, Rfl: 11   Review of Systems     Objective:   Physical Exam        Assessment & Plan:

## 2024-05-03 ENCOUNTER — Encounter: Payer: Self-pay | Admitting: Infectious Disease

## 2024-05-03 ENCOUNTER — Ambulatory Visit (INDEPENDENT_AMBULATORY_CARE_PROVIDER_SITE_OTHER): Payer: Self-pay | Admitting: Infectious Disease

## 2024-05-03 ENCOUNTER — Ambulatory Visit: Payer: Self-pay

## 2024-05-03 ENCOUNTER — Other Ambulatory Visit: Payer: Self-pay

## 2024-05-03 VITALS — BP 118/83 | HR 99 | Temp 97.7°F | Ht 72.0 in | Wt 219.0 lb

## 2024-05-03 DIAGNOSIS — G4700 Insomnia, unspecified: Secondary | ICD-10-CM

## 2024-05-03 DIAGNOSIS — B582 Toxoplasma meningoencephalitis: Secondary | ICD-10-CM

## 2024-05-03 DIAGNOSIS — B20 Human immunodeficiency virus [HIV] disease: Secondary | ICD-10-CM

## 2024-05-03 DIAGNOSIS — Z7185 Encounter for immunization safety counseling: Secondary | ICD-10-CM

## 2024-05-03 DIAGNOSIS — R1319 Other dysphagia: Secondary | ICD-10-CM

## 2024-05-03 DIAGNOSIS — K219 Gastro-esophageal reflux disease without esophagitis: Secondary | ICD-10-CM

## 2024-05-03 LAB — URINE CYTOLOGY ANCILLARY ONLY
Chlamydia: NEGATIVE
Comment: NEGATIVE
Comment: NORMAL
Neisseria Gonorrhea: NEGATIVE

## 2024-05-03 MED ORDER — BIKTARVY 50-200-25 MG PO TABS: 1.0000 | ORAL_TABLET | Freq: Every day | ORAL | 11 refills | Status: AC

## 2024-05-03 MED ORDER — TRAZODONE HCL 50 MG PO TABS: 50.0000 mg | ORAL_TABLET | Freq: Every evening | ORAL | 11 refills | Status: AC | PRN

## 2024-05-03 MED ORDER — OMEPRAZOLE 20 MG PO CPDR: 20.0000 mg | DELAYED_RELEASE_CAPSULE | Freq: Every day | ORAL | 11 refills | Status: AC

## 2024-05-04 LAB — T-HELPER CELLS (CD4) COUNT (NOT AT ARMC)
CD4 % Helper T Cell: 24 % — ABNORMAL LOW (ref 33–65)
CD4 T Cell Abs: 522 /uL (ref 400–1790)

## 2024-05-07 LAB — LIPID PANEL
Cholesterol: 122 mg/dL (ref <200)
HDL: 21 mg/dL — ABNORMAL LOW (ref >=40)
Non-HDL Cholesterol (Calc): 101 mg/dL (ref <130)
Total CHOL/HDL Ratio: 5.8 (calc) — ABNORMAL HIGH (ref <5.0)
Triglycerides: 467 mg/dL — ABNORMAL HIGH (ref <150)

## 2024-05-07 LAB — CBC WITH DIFFERENTIAL/PLATELET
Absolute Lymphocytes: 2129 {cells}/uL (ref 850–3900)
Absolute Monocytes: 992 {cells}/uL — ABNORMAL HIGH (ref 200–950)
Basophils Absolute: 36 {cells}/uL (ref 0–200)
Basophils Relative: 0.4 %
Eosinophils Absolute: 200 {cells}/uL (ref 15–500)
Eosinophils Relative: 2.2 %
HCT: 48.8 % (ref 38.5–50.0)
Hemoglobin: 15.7 g/dL (ref 13.2–17.1)
MCH: 27.4 pg (ref 27.0–33.0)
MCHC: 32.2 g/dL (ref 32.0–36.0)
MCV: 85.3 fL (ref 80.0–100.0)
MPV: 10.1 fL (ref 7.5–12.5)
Monocytes Relative: 10.9 %
Neutro Abs: 5742 {cells}/uL (ref 1500–7800)
Neutrophils Relative %: 63.1 %
Platelets: 377 Thousand/uL (ref 140–400)
RBC: 5.72 Million/uL (ref 4.20–5.80)
RDW: 13.7 % (ref 11.0–15.0)
Total Lymphocyte: 23.4 %
WBC: 9.1 Thousand/uL (ref 3.8–10.8)

## 2024-05-07 LAB — COMPLETE METABOLIC PANEL WITHOUT GFR
AG Ratio: 1.2 (calc) (ref 1.0–2.5)
ALT: 23 U/L (ref 9–46)
AST: 19 U/L (ref 10–40)
Albumin: 4.3 g/dL (ref 3.6–5.1)
Alkaline phosphatase (APISO): 156 U/L — ABNORMAL HIGH (ref 36–130)
BUN: 16 mg/dL (ref 7–25)
CO2: 24 mmol/L (ref 20–32)
Calcium: 9.5 mg/dL (ref 8.6–10.3)
Chloride: 106 mmol/L (ref 98–110)
Creat: 0.78 mg/dL (ref 0.60–1.26)
Globulin: 3.5 g/dL (ref 1.9–3.7)
Glucose, Bld: 97 mg/dL (ref 65–99)
Potassium: 4.2 mmol/L (ref 3.5–5.3)
Sodium: 140 mmol/L (ref 135–146)
Total Bilirubin: 0.4 mg/dL (ref 0.2–1.2)
Total Protein: 7.8 g/dL (ref 6.1–8.1)

## 2024-05-07 LAB — RPR: RPR Ser Ql: NONREACTIVE

## 2024-05-07 LAB — HIV RNA, RTPCR W/R GT (RTI, PI,INT)
HIV 1 RNA Quant: 51 {copies}/mL — ABNORMAL HIGH
HIV-1 RNA Quant, Log: 1.71 {Log_copies}/mL — ABNORMAL HIGH

## 2024-06-08 ENCOUNTER — Ambulatory Visit: Payer: Self-pay

## 2024-10-03 ENCOUNTER — Ambulatory Visit: Payer: Self-pay | Admitting: Infectious Disease

## 2024-10-30 ENCOUNTER — Ambulatory Visit: Payer: Self-pay | Admitting: Infectious Disease
